# Patient Record
Sex: Male | Born: 1945 | Race: White | Hispanic: No | Marital: Married | Smoking: Former smoker
Health system: Southern US, Community
[De-identification: ages and names within clinical notes are randomized; demographics above are authoritative.]

## PROBLEM LIST (undated history)

## (undated) DIAGNOSIS — R351 Nocturia: Secondary | ICD-10-CM

## (undated) DIAGNOSIS — G4752 REM sleep behavior disorder: Secondary | ICD-10-CM

## (undated) DIAGNOSIS — M542 Cervicalgia: Secondary | ICD-10-CM

## (undated) DIAGNOSIS — I2510 Atherosclerotic heart disease of native coronary artery without angina pectoris: Secondary | ICD-10-CM

## (undated) DIAGNOSIS — Z8719 Personal history of other diseases of the digestive system: Secondary | ICD-10-CM

## (undated) DIAGNOSIS — E349 Endocrine disorder, unspecified: Secondary | ICD-10-CM

## (undated) DIAGNOSIS — K5790 Diverticulosis of intestine, part unspecified, without perforation or abscess without bleeding: Secondary | ICD-10-CM

## (undated) DIAGNOSIS — G4730 Sleep apnea, unspecified: Secondary | ICD-10-CM

## (undated) DIAGNOSIS — E785 Hyperlipidemia, unspecified: Secondary | ICD-10-CM

## (undated) DIAGNOSIS — E119 Type 2 diabetes mellitus without complications: Secondary | ICD-10-CM

## (undated) DIAGNOSIS — I252 Old myocardial infarction: Secondary | ICD-10-CM

## (undated) DIAGNOSIS — H269 Unspecified cataract: Secondary | ICD-10-CM

## (undated) DIAGNOSIS — K219 Gastro-esophageal reflux disease without esophagitis: Secondary | ICD-10-CM

## (undated) DIAGNOSIS — K279 Peptic ulcer, site unspecified, unspecified as acute or chronic, without hemorrhage or perforation: Secondary | ICD-10-CM

## (undated) DIAGNOSIS — E039 Hypothyroidism, unspecified: Secondary | ICD-10-CM

## (undated) HISTORY — DX: Atherosclerotic heart disease of native coronary artery without angina pectoris: I25.10

## (undated) HISTORY — PX: CARDIAC CATHETERIZATION: SHX172

## (undated) HISTORY — DX: Peptic ulcer, site unspecified, unspecified as acute or chronic, without hemorrhage or perforation: K27.9

## (undated) HISTORY — PX: ESOPHAGOGASTRODUODENOSCOPY: SHX1529

## (undated) HISTORY — DX: Endocrine disorder, unspecified: E34.9

## (undated) HISTORY — DX: Old myocardial infarction: I25.2

## (undated) HISTORY — DX: Gastro-esophageal reflux disease without esophagitis: K21.9

## (undated) HISTORY — DX: Hyperlipidemia, unspecified: E78.5

## (undated) HISTORY — DX: Hypothyroidism, unspecified: E03.9

## (undated) HISTORY — DX: Type 2 diabetes mellitus without complications: E11.9

---
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
## 1999-12-03 HISTORY — PX: CORONARY ANGIOPLASTY WITH STENT PLACEMENT: SHX49

## 2000-08-23 ENCOUNTER — Encounter: Payer: Self-pay | Admitting: Emergency Medicine

## 2000-08-23 ENCOUNTER — Inpatient Hospital Stay (HOSPITAL_COMMUNITY): Admission: EM | Admit: 2000-08-23 | Discharge: 2000-08-25 | Payer: Self-pay | Admitting: Cardiology

## 2000-09-09 ENCOUNTER — Encounter (HOSPITAL_COMMUNITY): Admission: RE | Admit: 2000-09-09 | Discharge: 2000-12-08 | Payer: Self-pay | Admitting: Cardiology

## 2000-12-09 ENCOUNTER — Encounter (HOSPITAL_COMMUNITY): Admission: RE | Admit: 2000-12-09 | Discharge: 2001-03-09 | Payer: Self-pay | Admitting: Cardiology

## 2002-04-30 ENCOUNTER — Emergency Department (HOSPITAL_COMMUNITY): Admission: EM | Admit: 2002-04-30 | Discharge: 2002-05-01 | Payer: Self-pay | Admitting: Emergency Medicine

## 2003-12-03 HISTORY — PX: COLONOSCOPY: SHX174

## 2003-12-12 ENCOUNTER — Encounter (INDEPENDENT_AMBULATORY_CARE_PROVIDER_SITE_OTHER): Payer: Self-pay | Admitting: *Deleted

## 2003-12-12 ENCOUNTER — Ambulatory Visit (HOSPITAL_COMMUNITY): Admission: RE | Admit: 2003-12-12 | Discharge: 2003-12-12 | Payer: Self-pay | Admitting: Gastroenterology

## 2004-08-21 ENCOUNTER — Inpatient Hospital Stay (HOSPITAL_COMMUNITY): Admission: EM | Admit: 2004-08-21 | Discharge: 2004-08-22 | Payer: Self-pay | Admitting: Emergency Medicine

## 2007-07-21 ENCOUNTER — Encounter: Payer: Self-pay | Admitting: Internal Medicine

## 2007-09-01 ENCOUNTER — Ambulatory Visit: Payer: Self-pay | Admitting: Internal Medicine

## 2007-09-01 DIAGNOSIS — E349 Endocrine disorder, unspecified: Secondary | ICD-10-CM | POA: Insufficient documentation

## 2007-09-01 DIAGNOSIS — K279 Peptic ulcer, site unspecified, unspecified as acute or chronic, without hemorrhage or perforation: Secondary | ICD-10-CM | POA: Insufficient documentation

## 2007-09-01 DIAGNOSIS — K219 Gastro-esophageal reflux disease without esophagitis: Secondary | ICD-10-CM | POA: Insufficient documentation

## 2007-09-01 DIAGNOSIS — E785 Hyperlipidemia, unspecified: Secondary | ICD-10-CM

## 2007-09-01 DIAGNOSIS — I25118 Atherosclerotic heart disease of native coronary artery with other forms of angina pectoris: Secondary | ICD-10-CM | POA: Insufficient documentation

## 2007-09-01 DIAGNOSIS — E1169 Type 2 diabetes mellitus with other specified complication: Secondary | ICD-10-CM | POA: Insufficient documentation

## 2007-09-01 DIAGNOSIS — I2510 Atherosclerotic heart disease of native coronary artery without angina pectoris: Secondary | ICD-10-CM | POA: Insufficient documentation

## 2007-09-01 HISTORY — DX: Gastro-esophageal reflux disease without esophagitis: K21.9

## 2007-09-02 ENCOUNTER — Telehealth (INDEPENDENT_AMBULATORY_CARE_PROVIDER_SITE_OTHER): Payer: Self-pay | Admitting: *Deleted

## 2007-09-03 ENCOUNTER — Encounter: Payer: Self-pay | Admitting: Internal Medicine

## 2007-12-29 ENCOUNTER — Ambulatory Visit: Payer: Self-pay | Admitting: Cardiology

## 2008-01-06 ENCOUNTER — Ambulatory Visit: Payer: Self-pay

## 2008-01-06 ENCOUNTER — Encounter: Payer: Self-pay | Admitting: Internal Medicine

## 2008-03-14 ENCOUNTER — Telehealth: Payer: Self-pay | Admitting: Internal Medicine

## 2008-05-17 ENCOUNTER — Telehealth: Payer: Self-pay | Admitting: Internal Medicine

## 2008-05-20 ENCOUNTER — Telehealth: Payer: Self-pay | Admitting: Internal Medicine

## 2008-06-08 ENCOUNTER — Encounter: Payer: Self-pay | Admitting: Internal Medicine

## 2008-07-06 ENCOUNTER — Ambulatory Visit: Payer: Self-pay | Admitting: Internal Medicine

## 2009-02-20 ENCOUNTER — Ambulatory Visit: Payer: Self-pay | Admitting: Internal Medicine

## 2009-02-20 DIAGNOSIS — M5412 Radiculopathy, cervical region: Secondary | ICD-10-CM | POA: Insufficient documentation

## 2009-03-07 ENCOUNTER — Ambulatory Visit: Payer: Self-pay | Admitting: Internal Medicine

## 2009-05-16 ENCOUNTER — Telehealth: Payer: Self-pay | Admitting: Internal Medicine

## 2009-05-23 ENCOUNTER — Encounter: Payer: Self-pay | Admitting: Internal Medicine

## 2009-05-29 ENCOUNTER — Ambulatory Visit: Payer: Self-pay | Admitting: Internal Medicine

## 2009-05-29 DIAGNOSIS — E039 Hypothyroidism, unspecified: Secondary | ICD-10-CM | POA: Insufficient documentation

## 2009-05-29 LAB — CONVERTED CEMR LAB
Cholesterol, target level: 200 mg/dL
HDL goal, serum: 40 mg/dL
LDL Goal: 100 mg/dL

## 2009-06-05 ENCOUNTER — Encounter: Payer: Self-pay | Admitting: Internal Medicine

## 2009-06-16 ENCOUNTER — Telehealth: Payer: Self-pay | Admitting: Internal Medicine

## 2009-09-22 ENCOUNTER — Ambulatory Visit: Payer: Self-pay | Admitting: Internal Medicine

## 2009-11-07 ENCOUNTER — Ambulatory Visit: Payer: Self-pay | Admitting: Internal Medicine

## 2010-05-23 ENCOUNTER — Ambulatory Visit: Payer: Self-pay | Admitting: Internal Medicine

## 2010-05-23 DIAGNOSIS — T148 Other injury of unspecified body region: Secondary | ICD-10-CM

## 2010-05-23 DIAGNOSIS — W57XXXA Bitten or stung by nonvenomous insect and other nonvenomous arthropods, initial encounter: Secondary | ICD-10-CM | POA: Insufficient documentation

## 2010-05-23 LAB — CONVERTED CEMR LAB
ALT: 21 units/L (ref 0–53)
AST: 21 units/L (ref 0–37)
Albumin: 4.2 g/dL (ref 3.5–5.2)
Alkaline Phosphatase: 52 units/L (ref 39–117)
BUN: 13 mg/dL (ref 6–23)
Basophils Absolute: 0 10*3/uL (ref 0.0–0.1)
Basophils Relative: 0.5 % (ref 0.0–3.0)
Bilirubin, Direct: 0.1 mg/dL (ref 0.0–0.3)
CO2: 29 meq/L (ref 19–32)
Calcium: 9.2 mg/dL (ref 8.4–10.5)
Chloride: 110 meq/L (ref 96–112)
Cholesterol: 165 mg/dL (ref 0–200)
Creatinine, Ser: 1 mg/dL (ref 0.4–1.5)
Eosinophils Absolute: 0.1 10*3/uL (ref 0.0–0.7)
Eosinophils Relative: 2.1 % (ref 0.0–5.0)
GFR calc non Af Amer: 82.38 mL/min (ref >60)
Glucose, Bld: 96 mg/dL (ref 70–99)
HCT: 45.6 % (ref 39.0–52.0)
HDL: 40.2 mg/dL (ref >39.00)
Hemoglobin: 15.9 g/dL (ref 13.0–17.0)
LDL Cholesterol: 104 mg/dL — ABNORMAL HIGH (ref 0–99)
Lymphocytes Relative: 33 % (ref 12.0–46.0)
Lymphs Abs: 1.7 10*3/uL (ref 0.7–4.0)
MCHC: 34.7 g/dL (ref 30.0–36.0)
MCV: 92.9 fL (ref 78.0–100.0)
Monocytes Absolute: 0.4 10*3/uL (ref 0.1–1.0)
Monocytes Relative: 8.9 % (ref 3.0–12.0)
Neutro Abs: 2.8 10*3/uL (ref 1.4–7.7)
Neutrophils Relative %: 55.5 % (ref 43.0–77.0)
PSA: 1.6 ng/mL (ref 0.10–4.00)
Platelets: 155 10*3/uL (ref 150.0–400.0)
Potassium: 4.3 meq/L (ref 3.5–5.1)
RBC: 4.91 M/uL (ref 4.22–5.81)
RDW: 12.9 % (ref 11.5–14.6)
Sodium: 144 meq/L (ref 135–145)
TSH: 0.92 microintl units/mL (ref 0.35–5.50)
Total Bilirubin: 0.8 mg/dL (ref 0.3–1.2)
Total CHOL/HDL Ratio: 4
Total Protein: 7.2 g/dL (ref 6.0–8.3)
Triglycerides: 103 mg/dL (ref 0.0–149.0)
VLDL: 20.6 mg/dL (ref 0.0–40.0)
WBC: 5 10*3/uL (ref 4.5–10.5)

## 2010-05-31 ENCOUNTER — Ambulatory Visit: Payer: Self-pay | Admitting: Internal Medicine

## 2010-05-31 ENCOUNTER — Telehealth: Payer: Self-pay | Admitting: Internal Medicine

## 2010-09-20 ENCOUNTER — Ambulatory Visit: Payer: Self-pay | Admitting: Internal Medicine

## 2011-01-01 NOTE — Progress Notes (Signed)
Summary: simvastatin or crestor  Phone Note From Pharmacy   Caller: [facility]  [address]  [phone]* Summary of Call: Rec'd rx for simvastatin and crestor - is pt to be on both?   [phone] Initial call taken by: Duard Brady LPN,  May 31, 2010 3:29 PM  Follow-up for Phone Call        patient was to switch to crestor if price not prohibitive- not to take both drugs Follow-up by: Gordy Savers  MD,  May 31, 2010 5:14 PM  Additional Follow-up for Phone Call Additional follow up Details #1::        spoke with pharmacy - pt came in - went with zocor.  Additional Follow-up by: Duard Brady LPN,  May 31, 2010 5:19 PM

## 2011-01-01 NOTE — Assessment & Plan Note (Signed)
Summary: FLU-SHOT/RCD  Nurse Visit   Review of Systems       Flu Vaccine Consent Questions     Do you have a history of severe allergic reactions to this vaccine? no    Any prior history of allergic reactions to egg and/or gelatin? no    Do you have a sensitivity to the preservative Thimersol? no    Do you have a past history of Guillan-Barre Syndrome? no    Do you currently have an acute febrile illness? no    Have you ever had a severe reaction to latex? no    Vaccine information given and explained to patient? yes    Are you currently pregnant? no    Lot Number:[id number]   Exp Date:06/01/2011   Site Given  Left Deltoid IM    Allergies: No Known Drug Allergies  Orders Added: 1)  Flu Vaccine 3yrs + MEDICARE PATIENTS [Q2039] 2)  Administration Flu vaccine - MCR [G0008] 

## 2011-01-01 NOTE — Assessment & Plan Note (Signed)
Summary: ?spider bite/cpx labs v70.0/njr   Vital Signs:  Gamble profile:   65 year old male Weight:      214 pounds Temp:     97.7 degrees F oral BP sitting:   118 / 70  (right arm) Cuff size:   regular  Vitals Entered By: Duard Brady LPN (May 23, 2010 8:22 AM) CC: c/o ?spider bite to (R) upper thigh Is Gamble Diabetic? No   CC:  c/o ?spider bite to (R) upper thigh.  History of Present Illness: [age] year old  Gamble, who is seen today for follow-up.  Three days ago he sustained a bite to the right inner thigh region.  And he was concerned about a possible spider bite.  He felt an initial  sting  but was not able to identify any spider or bee  or other insect.  He had an initial soft tissue  reaction of approximately 8 cm with erythema and induration.  This has improved to approximately 2 x 4 cm.  Preventive Screening-Counseling & Management  Alcohol-Tobacco     Smoking Status: current  Allergies (verified): No Known Drug Allergies  Social History: Smoking Status:  current  Physical Exam  General:  Well-developed,well-nourished,in no acute distress; alert,appropriate and cooperative throughout examination Skin:  2 x 4 cm area of slight erythema and ecchymoses with a small 3 to 4-mm crust.  no fluctuance   Impression & Recommendations:  Problem # 1:  INSECT BITE (ICD-919.4)  Complete Medication List: 1)  Levothroid 125 Mcg Tabs (Levothyroxine sodium) .Marland Kitchen.. 1 once daily 2)  Albertsons Aspirin 325 Mg Tabs (Aspirin) .Marland Kitchen.. 1 once daily 3)  Simvastatin 40 Mg Tabs (Simvastatin) .... One daily 4)  Cialis 20 Mg Tabs (Tadalafil) .... One daily as  directed  Gamble Instructions: 1)  call if worsening pain, redness, or fever  Appended Document: Orders Update    Clinical Lists Changes  Orders: Added new Service order of Venipuncture (16109) - Signed Added new Service order of UA Dipstick w/o Micro (automated)  (81003) - Signed Added new Test order of TLB-Lipid  Panel (80061-LIPID) - Signed Added new Test order of TLB-BMP (Basic Metabolic Panel-BMET) (80048-METABOL) - Signed Added new Test order of TLB-CBC Platelet - w/Differential (85025-CBCD) - Signed Added new Test order of TLB-Hepatic/Liver Function Pnl (80076-HEPATIC) - Signed Added new Test order of TLB-TSH (Thyroid Stimulating Hormone) (84443-TSH) - Signed Added new Test order of TLB-PSA (Prostate Specific Antigen) (84153-PSA) - Signed Observations: Added new observation of COMMENTS: Wynona Canes, CMA  May 23, 2010 10:44 AM  (05/23/2010 8:54) Added new observation of PH URINE: 6.0  (05/23/2010 8:54) Added new observation of SPEC GR URIN: 1.025  (05/23/2010 8:54) Added new observation of APPEARANCE U: Clear  (05/23/2010 8:54) Added new observation of UA COLOR: yellow  (05/23/2010 8:54) Added new observation of WBC DIPSTK U: negative  (05/23/2010 8:54) Added new observation of NITRITE URN: negative  (05/23/2010 8:54) Added new observation of UROBILINOGEN: 0.2  (05/23/2010 8:54) Added new observation of PROTEIN, URN: negative  (05/23/2010 8:54) Added new observation of BLOOD UR DIP: 2+  (05/23/2010 8:54) Added new observation of KETONES URN: negative  (05/23/2010 8:54) Added new observation of BILIRUBIN UR: negative  (05/23/2010 8:54) Added new observation of GLUCOSE, URN: negative  (05/23/2010 8:54)      Laboratory Results   Urine Tests  Date/Time Recieved: May 23, 2010 10:44 AM  Date/Time Reported: May 23, 2010 10:44 AM   Routine Urinalysis   Color: yellow Appearance: Clear Glucose: negative   (  Normal Range: Negative) Bilirubin: negative   (Normal Range: Negative) Ketone: negative   (Normal Range: Negative) Spec. Gravity: 1.025   (Normal Range: 1.003-1.035) Blood: 2+   (Normal Range: Negative) pH: 6.0   (Normal Range: 5.0-8.0) Protein: negative   (Normal Range: Negative) Urobilinogen: 0.2   (Normal Range: 0-1) Nitrite: negative   (Normal Range: Negative) Leukocyte  Esterace: negative   (Normal Range: Negative)    Comments: Wynona Canes, CMA  May 23, 2010 10:44 AM

## 2011-01-01 NOTE — Assessment & Plan Note (Signed)
Summary: cpx/njr   Vital Signs:  Patient profile:   65 year old male Height:      67.25 inches Weight:      213 pounds Temp:     97.8 degrees F oral BP sitting:   120 / 80  (right arm) Cuff size:   regular  Vitals Entered By: Duard Brady LPN (May 31, 2010 8:40 AM) CC: cpx - doing well Is Patient Diabetic? No   CC:  cpx - doing well.  History of Present Illness: 65 year-old patient who has a history of coronary artery disease.  He has dyslipidemia, testosterone deficiency and also a mild right cervical radiculopathy.  Is doing quite well. Here for Medicare AWV:  1.   Risk factors based on Past M, S, F history:  patient has known coronary artery disease, status post stenting 2.   Physical Activities: no restrictions or limitations 3.   Depression/mood: history depression, or mood disorder 4.   Hearing: no auditory deficits 5.   ADL's: completely active and independent in all aspects of daily living 6.   Fall Risk: low 7.   Home Safety: no problems identified 8.   Height, weight, &visual acuity:no change in height.  Her weight has had a recent eye examination and glaucoma check 9.   Counseling: heart healthy diet or regular exercise encouraged as well as modest weight loss 10.   Labs ordered based on risk factors: laboratory studies, including lipid profile reviewed.  LDL approximately 100 Will intensify statin therapy 11.           Referral Coordination- not appropriate at this time 12.           Care Plan- heart healthy diet modest weight loss and exercise regimen encouraged 13.            Cognitive Assessment- alert and oriented, with normal affect, and   Preventive Screening-Counseling & Management  Alcohol-Tobacco     Smoking Status: current  Allergies (verified): No Known Drug Allergies  Past History:  Past Medical History: Reviewed history from 11/07/2009 and no changes required. Coronary artery disease  status post stenting 2001 Hyperlipidemia Peptic  ulcer disease patient admitted to the hospital for chest pain, and 2005, underwent heart catheterization and also had a negative Cardiolite stress test GERD testosterone deficiency Hypothyroidism history of impaired glucose tolerance  Past Surgical History: Reviewed history from 07/06/2008 and no changes required. Tonsillectomy colonoscopy 2005  Cardiolite  stress test February 2009  Family History: Reviewed history from 05/29/2009 and no changes required. father age 87  memory loss mother, age 74  One brother deceased with hepatitis and half brother, coronary artery disease one sister at 7, is well  Social History: Reviewed history from 07/06/2008 and no changes required. Married Regular exercise-no  Review of Systems  The patient denies anorexia, fever, weight loss, weight gain, vision loss, decreased hearing, hoarseness, chest pain, syncope, dyspnea on exertion, peripheral edema, prolonged cough, headaches, hemoptysis, abdominal pain, melena, hematochezia, severe indigestion/heartburn, hematuria, incontinence, genital sores, muscle weakness, suspicious skin lesions, transient blindness, difficulty walking, depression, unusual weight change, abnormal bleeding, enlarged lymph nodes, angioedema, breast masses, and testicular masses.    Physical Exam  General:  Well-developed,well-nourished,in no acute distress; alert,appropriate and cooperative throughout examination Head:  Normocephalic and atraumatic without obvious abnormalities. No apparent alopecia or balding. Eyes:  No corneal or conjunctival inflammation noted. EOMI. Perrla. Funduscopic exam benign, without hemorrhages, exudates or papilledema. Vision grossly normal. Ears:  External ear exam shows no significant  lesions or deformities.  Otoscopic examination reveals clear canals, tympanic membranes are intact bilaterally without bulging, retraction, inflammation or discharge. Hearing is grossly normal bilaterally. Nose:   External nasal examination shows no deformity or inflammation. Nasal mucosa are pink and moist without lesions or exudates. Mouth:  Oral mucosa and oropharynx without lesions or exudates.  Teeth in good repair. Neck:  No deformities, masses, or tenderness noted. Chest Wall:  No deformities, masses, tenderness or gynecomastia noted. Breasts:  No masses or gynecomastia noted Lungs:  Normal respiratory effort, chest expands symmetrically. Lungs are clear to auscultation, no crackles or wheezes. Heart:  Normal rate and regular rhythm. S1 and S2 normal without gallop, murmur, click, rub or other extra sounds. Abdomen:  Bowel sounds positive,abdomen soft and non-tender without masses, organomegaly or hernias noted. Rectal:  No external abnormalities noted. Normal sphincter tone. No rectal masses or tenderness. Genitalia:  Testes bilaterally descended without nodularity, tenderness or masses. No scrotal masses or lesions. No penis lesions or urethral discharge. Prostate:  1+ enlarged.  1+ enlarged.   Msk:  No deformity or scoliosis noted of thoracic or lumbar spine.   Pulses:  dorsalis pedis pulses faint ;posterior tibial pulses were full Extremities:  No clubbing, cyanosis, edema, or deformity noted with normal full range of motion of all joints.   Neurologic:  No cranial nerve deficits noted. Station and gait are normal. Plantar reflexes are down-going bilaterally. DTRs are symmetrical throughout. Sensory, motor and coordinative functions appear intact. Skin:  Intact without suspicious lesions or rashes Cervical Nodes:  No lymphadenopathy noted Axillary Nodes:  No palpable lymphadenopathy Inguinal Nodes:  No significant adenopathy Psych:  Cognition and judgment appear intact. Alert and cooperative with normal attention span and concentration. No apparent delusions, illusions, hallucinations   Impression & Recommendations:  Problem # 1:  HYPOTHYROIDISM (ICD-244.9)  His updated medication list for  this problem includes:    Levothroid 125 Mcg Tabs (Levothyroxine sodium) .Marland Kitchen... 1 once daily  His updated medication list for this problem includes:    Levothroid 125 Mcg Tabs (Levothyroxine sodium) .Marland Kitchen... 1 once daily  Problem # 2:  CERVICAL RADICULOPATHY, RIGHT (ICD-723.4)  Problem # 3:  TESTOSTERONE DEFICIENCY (ICD-257.2)  Problem # 4:  CORONARY ARTERY DISEASE (ICD-414.00)  His updated medication list for this problem includes:    Albertsons Aspirin 325 Mg Tabs (Aspirin) .Marland Kitchen... 1 once daily  Orders: EKG w/ Interpretation (93000)  His updated medication list for this problem includes:    Albertsons Aspirin 325 Mg Tabs (Aspirin) .Marland Kitchen... 1 once daily  Problem # 5:  Preventive Health Care (ICD-V70.0)  Complete Medication List: 1)  Levothroid 125 Mcg Tabs (Levothyroxine sodium) .Marland Kitchen.. 1 once daily 2)  Albertsons Aspirin 325 Mg Tabs (Aspirin) .Marland Kitchen.. 1 once daily 3)  Simvastatin 40 Mg Tabs (Simvastatin) .... One daily 4)  Cialis 20 Mg Tabs (Tadalafil) .... One daily as  directed 5)  Crestor 40 Mg Tabs (Rosuvastatin calcium) .... One daily  Other Orders: First annual wellness visit with prevention plan  [phone])  Patient Instructions: 1)  Please schedule a follow-up appointment in 1 year. 2)  Limit your Sodium (Salt). 3)  It is important that you exercise regularly at least 20 minutes 5 times a week. If you develop chest pain, have severe difficulty breathing, or feel very tired , stop exercising immediately and seek medical attention. 4)  You need to lose weight. Consider a lower calorie diet and regular exercise.  Prescriptions: CRESTOR 40 MG TABS (ROSUVASTATIN CALCIUM) one daily  #  90 x 6   Entered and Authorized by:   Gordy Savers  MD   Signed by:   Gordy Savers  MD on 05/31/2010   Method used:   Electronically to        [facility]  [phone]* (retail)       [address]       [place], [place]  [address]       Ph: [phone] or  [phone]       Fax: [phone]   RxID:   [id number] CIALIS 20 MG  TABS (TADALAFIL) one daily as  directed  #12 x 6   Entered and Authorized by:   Gordy Savers  MD   Signed by:   Gordy Savers  MD on 05/31/2010   Method used:   Electronically to        [facility]  [phone]* (retail)       [address]       [place], [place]  [address]       Ph: [phone] or [phone]       Fax: [phone]   RxID:   [id number] SIMVASTATIN 40 MG  TABS (SIMVASTATIN) one daily  #90 Each x 6   Entered and Authorized by:   Gordy Savers  MD   Signed by:   Gordy Savers  MD on 05/31/2010   Method used:   Electronically to        [facility]  [phone]* (retail)       [address]       [place], [place]  [address]       Ph: [phone] or [phone]       Fax: [phone]   RxID:   [id number] LEVOTHROID 125 MCG  TABS (LEVOTHYROXINE SODIUM) 1 once daily  #90 Each x 6   Entered and Authorized by:   Gordy Savers  MD   Signed by:   Gordy Savers  MD on 05/31/2010   Method used:   Electronically to        [facility]  [phone]* (retail)       [address]       [place], [place]  [address]       Ph: [phone] or [phone]       Fax: [phone]   RxID:   [id number]   Appended Document: cpx/njr    Clinical Lists Changes  Medications: Removed medication of CRESTOR 40 MG TABS (ROSUVASTATIN CALCIUM) one daily

## 2011-02-04 ENCOUNTER — Telehealth: Payer: Self-pay | Admitting: Internal Medicine

## 2011-02-04 NOTE — Telephone Encounter (Signed)
Triage vm------wants nurse to return call.

## 2011-02-04 NOTE — Telephone Encounter (Signed)
Calling for mother inlaw? Had a spell where she "blamked out" - no LOC - no slurred speech , feeling fine - ok to watch - if it happens again - needs to be been. KIK

## 2011-04-16 NOTE — Assessment & Plan Note (Signed)
[facility]                            CARDIOLOGY OFFICE NOTE   NAME:Adrian Gamble, Adrian Gamble                           MRN:          [id number]  DATE:12/29/2007                            DOB:          Oct 23, 1946    PRIMARY CARE PHYSICIAN:  Gordy Savers, MD.   REASON FOR PRESENTATION:  Evaluate patient with shortness of breath,  chest discomfort and previous coronary disease.   HISTORY OF PRESENT ILLNESS:  The patient is a pleasant 65 year old  gentleman with a history of coronary artery disease including myocardial  infarction in 2001 with stenting to his right coronary.  He has had,  otherwise, nonobstructive disease with 60-70% LAD lesion.  He has not  been seen in follow-up in over 3 years.  He had no new cardiovascular  symptoms until about 3 months ago.  He started noticing dyspnea with  activities.  Some of this was with bending over.  He is not particularly  active.  He did notice it pushing a lawnmower this summer.  He had to  stop towards the end of the year when he was doing it at rest.  Activities currently daily, such as walking upstairs, does not  particular bother him.  He is not getting any resting symptoms and does  not describe any PND or orthopnea.  He has had an uneasy feeling in his  chest sporadically.  This happens at rest.  He cannot bring this on with  activity, though again he is relatively sedentary.  He has not had any  classic symptoms such as substernal pressure, neck discomfort, arm  discomfort, activity induced nausea, vomiting or excessive diaphoresis.  He is not describing palpitations, presyncope or syncope.   PAST MEDICAL HISTORY:  1. Coronary artery disease (inferior myocardial infarction 2001 with      stenting of an RCA lesion and total occlusion of that vessel.  The      last catheterization was in 2005 demonstrating 60-70% mid-LAD      stenosis, luminal irregularities in the circumflex system, patent      right  coronary stent with 25% scattered lesions in the right      system.  Well-preserved ejection fraction.).  2. Hypothyroidism.  3. Diverticulitis.  4. Hiatal hernia.  5. Dyslipidemia.   ALLERGIES:  None.   MEDICATIONS:  1. Aspirin 325 mg daily.  2. Synthroid 0.125 mg daily.  3. Statin (we cannot identify which one).   SOCIAL HISTORY:  The patient is a Engineer, water.  He is married.  He  has three children and six grandchildren.  Quit smoking many years ago.  He does occasionally smoke cigar.   REVIEW OF SYSTEMS:  As stated in the HPI and otherwise negative for  other systems.   PHYSICAL EXAMINATION:  GENERAL:  The patient is in no distress.  VITAL SIGNS:  Blood pressure 120/94, heart rate 90 and regular, weight  230 pounds, body mass index 34.  HEENT:  Eyelids are unremarkable.  Pupils equal, round, reactive, fundi  not well visualized.  Oral mucosa unremarkable.  NECK:  No jugular distention at 45 degrees.  Carotid upstroke brisk and  symmetrical.  No bruits, no thyromegaly.  LYMPHATICS:  No cervical, axillary, inguinal adenopathy.  LUNGS:  Clear to auscultation bilaterally.  BACK:  No costovertebral tenderness.  CHEST:  Unremarkable.  HEART:  PMI not displaced or sustained, S1-S2 within normal.  No S3, no  S4, no clicks, rubs, murmurs.  ABDOMEN:  Obese, positive bowel sounds, normal in frequency and pitch,  no bruits, rebound, guarding or midline pulsatile mass.  No  hepatomegaly, no splenomegaly.  SKIN:  No rashes, no nausea.  EXTREMITIES:  Pulses 2+ throughout, no edema, cyanosis or clubbing.  NEUROLOGICAL:  Oriented first place, time, cranial nerves II-XII grossly  intact, motor grossly intact.   STUDIES:  EKG sinus rhythm, rate 90, axis within normal limits,  intervals within normal limits, no acute ST-T wave changes.   ASSESSMENT/PLAN:  1. Dyspnea and chest discomfort.  The patient does have the symptoms      as described above.  He has known coronary disease  with a 60-70%      LAD lesion in 2005.  He has not participated aggressively in risk      reduction.  Though his lipids apparently have been managed.  Given      all of this, the pretest probability of obstructive coronary      disease is at least moderately high.  Proceeding with a stress      perfusion study is indicated and necessary.  The patient could walk      on a treadmill, and I will order this.  Further evaluation based on      these results.  2. Dyslipidemia.  I will try to review any recent labs.  Will try to      find the name of the statin that he is taking.  The goal with his      lipids would be an LDL of less than 100 and HDL greater than 40.  3. Obesity.  He understands he needs to loose weight with diet and      exercise, and we discussed the [employer].  Following a      stress test I may be able to give him a prescription for exercise.  4. Hypothyroidism.  He continues on the Synthroid.  5. Tobacco.  He is told to avoid all tobacco products including his      cigars.  6. Follow-up.  I would see him back based on results of the above or      continued symptoms.  I      did tell him I think he should see me about every 18 months if he      is doing well and has no symptoms or abnormal tests.     Rollene Rotunda, MD, [facility]  Electronically Signed    JH/MedQ  DD: 12/29/2007  DT: 12/30/2007  Job #: [id number]   cc:   Gordy Savers, MD

## 2011-04-19 NOTE — Discharge Summary (Signed)
[place]  Patient:    Adrian Gamble, Adrian Gamble                           MRN: [id number] Adm. Date:  [id number] Disc. Date: [id number] Attending:  Shelba Flake Dictator:   Delton See, P.A.                           Discharge Summary  ADDENDUM  DATE OF BIRTH:  07-01-1946.  This patient had a chest x-ray performed at [facility] that apparently was not read at [facility] and was transferred to [facility] supposedly with the patient; however, the chest x-ray was lost. It was finally located and read today by the radiologist after the patient had already been discharged. The chest x-ray was read as negative for active disease with the heart thought to be at the upper limits of normal in size. There were low lung volumes and the pulmonary vascularity and lung fields were thought to be within normal limits. This was a portable film done at [facility] September 22 at the time of the patients admission. DD:  08/25/00 TD:  08/25/00 Job: [id number]

## 2011-04-19 NOTE — Discharge Summary (Signed)
[place]  Patient:    ESHAWN, COOR                           MRN: [id number] Adm. Date:  [id number] Disc. Date: [id number] Attending:  Shelba Flake Dictator:   Delton See, P.A. CC:         Otilio Connors. Gerri Spore, M.D.                           Discharge Summary  HISTORY OF PRESENT ILLNESS:  Mr. Klarich is a 65 year old male with a history of hypothyroidism, but no prior cardiac history.  He developed chest pain at approximately 1:30 p.m. while mowing the lawn.  He was brought to [facility] emergency room at 3 p.m. and EKG showed an acute inferior MI with ST elevation of 3 mm in 3 and aVF.  There was ST depression of 2 mm in 1 and aVL.  He received nitroglycerin and aspirin without relief.  He was admitted for further evaluation.  PAST MEDICAL HISTORY:  Significant for hypothyroidism, diverticulitis, and hiatal hernia.  ALLERGIES:  No known drug allergies.  MEDICATIONS:  Synthroid dosage unknown.  FAMILY HISTORY:  Significant for father having coronary artery bypass graft surgery in his 67s.  SOCIAL HISTORY:  The patient quit smoking eight years ago.  He does smoke an occasional cigar.  HOSPITAL COURSE:  As noted, this patient was admitted to [facility] by Dr. Antoine Poche with an acute inferior MI.  He was taken to the cardiac catheterization laboratory by Dr. Chales Abrahams.  He was found to have a 100% occluded RCA.  PTCA stenting was performed on the lesion reducing it to 0%. The patient also had 30, 60, and 70% LAD lesions.  There was a mid 90% RCA lesion as well.  The ejection fraction was 55% with mild hypokinesis.  The patient tolerated the procedure well.  He was enrolled in the Prove-it trial for treatment of hyperlipidemia and he was discharged on August 25, 2000, in improved condition.  LABORATORY DATA:  Cardiac enzymes on August 24, 2000, revealed a CK of 362, MB 46.4, relative index was 12.8.   Basic metabolic panel on August 24, 2000, showed a BUN 12, creatinine 1.0, potassium 3.6, sodium 139, glucose 88. A CBC on September 23, revealed hemoglobin 14.2, hematocrit 40.8, WBC 9.7 thousand, and platelets 233,000.  TSH was 2.231.  Lipid profile revealed cholesterol 192, triglycerides 156, HDL low at 31, LDL 130 which was the upper limits of normal.  CK-MB enzymes on September 23, revealed a totak CK of 649, MB 77.4, index 11.9.  This was the peak of his cardiac enzymes.  An EKG showed normal sinus rhythm with sinus arrhythmia, inferior infarct, age undetermined.  DISCHARGE MEDICATIONS: 1. Synthroid as taken previously. 2. Coated aspirin 325 mg each day. 3. Lopressor 50 mg 1/4 tablet twice each day. 4. Plavix 75 mg daily for four weeks. 5. Nitroglycerin as needed for chest pain.  The patient was instructed in the use of nitroglycerin.  He was also to be on the study drug for elevated lipids.  The patient was told to avoid any strenuous activity until seen in follow-up. He was told he could do some light walking.  He was to be on a low salt, low fat, diet.  He was told to call the office if he had any increased pain,  swelling, or bleeding from her groin.  He could return to work in one to two weeks.  He was to see Dr. Antoine Poche, October 8, at 12:30.  He was to see Dr. Gerri Spore as needed or as scheduled.  DISCHARGE DIAGNOSES: 1. Acute myocardial infarction, August 23, 2000, treated with percutaneous    transluminal coronary angioplasty stenting of the right coronary artery    with residual lesions as detailed above, ejection fraction 55%. 2. History of hypothyroidism. 3. History of hiatal hernia. 4. Enrolled in the Prove-it trial for elevated lipids. 5. Cardiac rehabilitation recommended in two weeks after discharge.  This    was set up while at [facility]. 6. Cardiolite study recommended in 7 to 8 weeks to further evaluate his    coronary artery  disease. 7. Remote history of tobacco use. DD:  08/25/00 TD:  08/25/00 Job: [id number]

## 2011-04-19 NOTE — Op Note (Signed)
NAME:  Adrian Gamble, Adrian Gamble NO.:  [id number]   MEDICAL RECORD NO.:  [id number]                   PATIENT TYPE:  AMB   LOCATION:  ENDO                                 FACILITY:  [facility]   PHYSICIAN:  Graylin Shiver, M.D.                DATE OF BIRTH:  1946/11/29   DATE OF PROCEDURE:  12/12/2003  DATE OF DISCHARGE:                                 OPERATIVE REPORT   PROCEDURE:  Colonoscopy with biopsy.   ENDOSCOPIST:  Graylin Shiver, M.D.   INDICATIONS FOR PROCEDURE:  Screening.   INFORMED CONSENT:  An informed consent was obtained after an explanation of  the risks of bleeding, infection and perforation.   PREMEDICATION:  Fentanyl 60 mcg IV, Versed 6 mg IV.   DESCRIPTION OF PROCEDURE:  With the patient in the left lateral decubitus  position, a rectal examination was performed, and no masses were felt.  The  Olympus colonoscope was inserted into the rectum and advanced around the  colon to the cecum.  The cecal landmarks were identified.  The cecum and  ascending colon were normal.  The transverse colon was normal.  The  descending colon and sigmoid revealed moderate diverticulosis.  In the  sigmoid there was a small polyp biopsied off with cold forceps.  The rectum  looked normal.  Also in the sigmoid there was an everted diverticulum.  The patient tolerated the procedure well without complications.   IMPRESSION:  1. Diverticulosis - code #562.10.  2. Sigmoid polyp - code #211.3.                                               Graylin Shiver, M.D.    Germain Osgood  D:  12/12/2003  T:  12/12/2003  Job:  [id number]   cc:   Otilio Connors. Gerri Spore, M.D.  [address]  [place]  [place] [address]  Fax: [phone]

## 2011-04-19 NOTE — H&P (Signed)
NAME:  Adrian Gamble, UY NO.:  [id number]   MEDICAL RECORD NO.:  [id number]          PATIENT TYPE:  EMS   LOCATION:  MAJO                         FACILITY:  [facility]   PHYSICIAN:  Charlies Constable, M.D. [facility] DATE OF BIRTH:  July 24, 1946   DATE OF ADMISSION:  08/21/2004  DATE OF DISCHARGE:                                HISTORY & PHYSICAL   REASON FOR ADMISSION:  The patient is a 65 year old male, with known  coronary artery disease, status post acute ST elevation inferior myocardial  infarction in September 2001, treated with stenting (non-DES) of a totally  occluded proximal RCA with thrombus, who is now seen here in the office as  an add-on for evaluation of recent, acute fatigue and persistent chest  tightness which started this morning.   The patient has not been seen here in the office since November of 2003.  Following this acute intervention, with residual coronary anatomy notable  for a 60% proximal, 70% mid LAD and preserved left ventricular function, the  patient had a normal stress Cardiolite in November 2003.   The patient reportedly has done well since his last stress test.  He has,  however, had 4 brief episodes of left-sided chest discomfort described as a  twinge, not associated with any significant symptoms nor radiation.  He  also denied any recent exertional chest discomfort.  The patient recently  drove 250 miles to [place], with his family, but reported frequent stops.  Since Saturday night, however, he has felt extremely worn out and fatigued.  This morning, during a scheduled visit with his primary care physician, he  reported the fatigue as well as development of pan-sternal chest tightness.  He states that this is reminiscent of the early stages of his MI in 2001.   Electrocardiogram here in the office revealed sinus tachycardia at 105 BPM  with no acute changes.   ALLERGIES:  No known drug allergies.   CURRENT MEDICATIONS:  1.  Coated aspirin  325 mg daily.  2.  Lipitor 40 mg daily.  3.  Synthroid 0.1 mg daily.   PAST MEDICAL HISTORY:  1.  Coronary artery disease      1.  Status post acute ST inferior MI/stent (Elite), 100 % RCA with          thrombus, September 2001.      2.  Residual 60% proximal/70% mid LAD; normal CFX.      3.  Preserved left ventricular function.      4.  PROVE-IT Trial.  2.  Dyslipidemia.  3.  Hiatal hernia.  4.  History of diverticulitis.   SOCIAL HISTORY:  The patient lives here in [place] with his wife.  He has  2 grown children.  He works as a Engineer, water at [employer].  He smokes the  occasional cigar, but has not smoked cigarettes in 20 years.  He drinks beer  on rare occasion.   FAMILY HISTORY:  Father, age 76, status post CABG in his 24s.   REVIEW OF SYSTEMS:  Review of systems negative for recent exertional  dyspnea,  orthopnea, PND, pedal edema, or tachy-palpitations.  Denies any  recent evidence of upper or lower GI bleeding.  Has occasional heartburn  symptoms.  Otherwise as per HPI, remaining systems negative.   PHYSICAL EXAMINATION:  VITAL SIGNS:  Blood pressure 120/66, pulse 105,  regular, weight 230.5.  GENERAL:  [age]-year-old male in no apparent distress.  HEENT:  Normocephalic, atraumatic.  NECK:  Preserved bilateral carotid pulses without bruits.  LUNGS:  Lungs clear to auscultation in all fields.  HEART:  Regular rate and rhythm (S1 and S2), positive S4.  No murmurs.  ABDOMEN:  Abdomen soft, nontender.  Intact bowel sounds.  No bruits.  EXTREMITIES:  Preserved bilateral femoral pulses without bruits; intact  distal pulses.  No pedal edema nor focal deficit.   IMPRESSION:  1.  Unstable angina pectoris.  2.  Sinus tachycardia.  3.  Coronary artery disease.      1.  Status post acute inferior myocardial infarction/stent by right          coronary artery, September 2001.      2.  Preserved left ventricular function.  4.  Dyslipidemia.  5.  Tobacco.  6.  Hiatal  hernia.  7.  Obesity.   PLAN:  Following review with Dr. Charlies Constable, recommendation is to proceed  with direct admission to [facility] for management and further  evaluation of symptoms worrisome for unstable angina pectoris.  The patient  will be treated with aspirin and started on Lopressor 25 mg q.8 h.  He will  be placed on intravenous nitroglycerin and heparin.  In addition to routine  blood work, we will cycle cardiac markers, check fasting lipids, TSH and a D-  dimer level.  Plan is to proceed with diagnostic coronary angiography and  the patient is agreeable with this plan and risks/benefits have been  discussed, and patient is willing to proceed.       GS/MEDQ  D:  08/21/2004  T:  08/21/2004  Job:  [id number]   cc:   Otilio Connors. Gerri Spore, M.D.  [address]  [place]  [place] [address]  Fax: [phone]

## 2011-04-19 NOTE — Discharge Summary (Signed)
Adrian Gamble, Adrian Gamble NO.:  [id number]   MEDICAL RECORD NO.:  [id number]          PATIENT TYPE:  INP   LOCATION:  2023                         FACILITY:  [facility]   PHYSICIAN:  Charlies Constable, M.D. [facility] DATE OF BIRTH:  02-29-1946   DATE OF ADMISSION:  08/21/2004  DATE OF DISCHARGE:  08/22/2004                           DISCHARGE SUMMARY - REFERRING   PROCEDURES:  1.  Cardiac catheterization August 21, 2004.  2.  Adenosine Cardiolite.   REASON FOR ADMISSION:  Please refer to dictated admission note.   LABORATORY DATA:  Normal serial cardiac markers.  D-Dimer 0.66.  Lipid  profile:  Total cholesterol 113, triglyceride 92, HDL 29, LDL 66, TSH 1.62,  normal electrolytes and renal function, mildly elevated AST 43, ALT 49,  otherwise normal liver panel, normal CBC.   Admission chest x-ray:  NAD.   HOSPITAL COURSE:  Following direct admission from the office, where the  patient presented with a generalized fatigue and persistent chest tightness  reminiscent of his previous inferior mi, the patient was stabilized on  medication regimen including aspirin, beta-blocker, intravenous  nitroglycerin, and heparin.   Serial cardiac markers were all within normal limits.  Of note, D-Dimer was  mildly elevated (0.66).  No further workup was recommended.   The patient proceed directly to the catheterization lab for diagnostic  coronary angiography, performed by Dr. Antoine Poche (see report for full  details).  This revealed nonobstructive coronary artery disease with a  patent right coronary artery stent and normal left ventricular function.   Given this finding, recommendation was to proceed with a followup adenosine  Cardiolite.  This was performed prior to discharge and revealed no evidence  of ischemia; calculated ejection fraction of 53%.   No further workup was recommended.  Of note, Dr. Antoine Poche commented on the  mildly elevated D-Dimer following his catheterization and  felt that there  was no clinical evidence suggestive of DVT or pulmonary embolus.   On scheduled day of discharge, the patient was started on low-dose Norvasc,  by Dr. Dietrich Pates, for antianginal treatment.  He noted, however, that the  patient's low blood pressure (90/50) limited further medical management.   DISCHARGE MEDICATIONS:  1.  Coated aspirin 325 mg daily.  2.  Lipitor 40 mg daily.  3.  Synthroid 0.1 mg daily.  4.  Norvasc 2.5 mg daily.  5.  Nitrostat 0.4 mg p.r.n.   INSTRUCTIONS:  No heavy lifting/driving x2 days; maintain low fat/low  cholesterol diet; call the office if there is any swelling/bleeding of the  groin.   The patient is scheduled to follow up with Dr. Antoine Poche on September 21, 2004  at 3:30 p.m.   DISCHARGE DIAGNOSES:  1.  Nonischemic chest pain.      1.  Normal serial cardiac markers.      2.  Nonobstructive coronary artery disease/normal left ventricular          function--cardiac catheterization August 21, 2004.      3.  Status post acute inferior myocardial infarction/stent right          coronary  artery September 2001.  2.  Status post sinus tachycardia.  3.  Mildly elevated D-Dimer.  4.  Hypotension.  5.  Dyslipidemia.  6.  Hypothyroidism.  7.  Tobacco.       GS/MEDQ  D:  08/22/2004  T:  08/22/2004  Job:  [id number]   cc:   Otilio Connors. Gerri Spore, M.D.  [address]  [place]  [place] [address]  Fax: [phone]

## 2011-04-19 NOTE — Cardiovascular Report (Signed)
Adrian Gamble, NUNN NO.:  [id number]   MEDICAL RECORD NO.:  [id number]          PATIENT TYPE:  INP   LOCATION:  2023                         FACILITY:  [facility]   PHYSICIAN:  Rollene Rotunda, M.D.   DATE OF BIRTH:  1946/10/31   DATE OF PROCEDURE:  08/21/2004  DATE OF DISCHARGE:                              CARDIAC CATHETERIZATION   PRIMARY CARE PHYSICIAN:  Carola J. Gerri Spore, M.D.   PROCEDURE:  Left heart catheterization, coronary arteriography.   INDICATION:  Evaluate patient with chest pain and previous coronary disease  status post acute inferior infarct with past with stenting of his right  coronary artery.   PROCEDURE NOTE:  Left heart catheterization was performed via the right  femoral artery.  The artery was cannulated using an anterior wall puncture.  A 6 French arterial sheath was inserted via the modified Seldinger  technique.  Preformed Judkins and a pigtail catheter were utilized.  The  patient tolerated the procedure well and left the lab in stable condition.   RESULTS:   HEMODYNAMICS:  1.  LV 104/15.  2.  Aortic 103/81.   CORONARIES:  The left main had distal 25% stenosis.  The LAD had proximal  and mid calcification, proximal 25% stenosis and mid 60-70% stenosis.  This  appeared to be unchanged from  his previous cath.  The first, second and  third diagonals were small and normal.  Circumflex in the AV groove had  diffuse luminal irregularities.  The ramus intermediate was small and  normal.  OM-1 was large with diffuse proximal luminal irregularities.  OM-2  was large with mid luminal irregularities.  Posterior lateral was large and  normal.   Right coronary artery:  The right coronary artery was large dominant vessel.  Proximal stent with diffuse luminal irregularities.  There was a long mid  25% stenosis.  PDA was moderate size with proximal 25% stenosis.   LEFT VENTRICULOGRAM:  Left ventriculogram was obtained in the RAO projection  with an EF of 65%.   CONCLUSION:  1.  Nonobstructive coronary disease with patent stent in the right coronary.  2.  Good left ventricular function.   PLAN:  The patient will continue to have secondary risk reduction.       JH/MEDQ  D:  08/21/2004  T:  08/22/2004  Job:  [id number]   cc:   Otilio Connors. Gerri Spore, M.D.  [address]  [place]  [place] [address]  Fax: [phone]

## 2011-04-19 NOTE — Cardiovascular Report (Signed)
Adrian Gamble. [facility]  Patient:    Adrian Gamble, Adrian Gamble                           MRN: [id number] Proc. Date: 08/23/00 Adm. Date:  [id number] Disc. Date: [id number] Attending:  Rollene Rotunda CC:         Rollene Rotunda, M.D. [facility]  Dr. Clyde Canterbury, [facility]   Cardiac Catheterization  PROCEDURES PERFORMED: 1. Left heart catheterization. 2. Left ventriculogram. 3. Selective coronary angiography. 4. Percutaneous transluminal coronary angioplasty and stenting of the    right coronary artery.  DIAGNOSES: 1. Severe two-vessel coronary artery disease. 2. Normal left ventricular systolic function. 3. Acute inferior wall myocardial infarction.  INDICATIONS:  [name] is a 65 year old white male who presented to [facility] Emergency Room with acute onset of substernal chest discomfort while mowing his lawn.  He was found to have ST elevation in the inferior leads consistent with acute inferior wall myocardial infarction and he was transferred to [facility] for emergent cardiac catheterization.  TECHNIQUE:  After informed consent was obtained, the patient was brought to the cardiac catheterization lab where both groins were sterilely prepped and draped.  Lidocaine 1% was used to infiltrate the right groin.  A 7 French arterial and 6 French venous sheath placed using modified Seldinger technique. The 6 [place] and JR4 catheters were then used to engage the left and right coronary arteries.  Selective angiography performed in various projections using manual injections of contrast.  A 6 French pigtail catheter was then advanced in the left ventricle and the left ventriculogram performed using power injections of contrast.  FINDINGS:  Initial findings are as follows: 1. Left main trunk:  The left main trunk is a large caliber vessel with a    distal tubular narrowing of 30%. 2. Left anterior descending:  This is a large caliber vessel that provides    two  small diagonal branches.  There is a proximal narrowing of 60%.  The    mid section is then diffusely diseased with a focal narrowing of 70%.  The    remainder of the vessel has mild diffuse irregularities. 3. Ramus intermedius:  This is a small caliber vessel that bifurcates in its    mid section.  There are mild irregularities in the ramus system. 4. Left circumflex artery:  This is a large caliber vessel that provides four    marginal branches.  Mild disease is noted in the left circumflex system not    greater than 30%. 5. Right coronary artery:  Dominant.  This is a large caliber vessel that    provides the posterior descending artery and two posterior ventricular    branches in its terminal segment.  The right coronary artery is    occluded in the mid section with evidence of thrombus.  LEFT VENTRICULOGRAM:  Normal end-systolic and end-diastolic dimensions. Overall left ventricular function is well preserved.  Ejection fraction of greater than 55%.  No mitral regurgitation.  There is very mild basilar inferior wall hypokinesis.  LV pressure is 123/20, aortic is 123/80.  LVEDP equals 25.  These findings were reviewed with the patient and we elected to proceed with percutaneous intervention to the right coronary artery.  The patient had previously received heparin to maintain ACT of approximately 210 seconds.  He was supplemented with ReoPro on a weight-adjusted basis and given 300 mg of Plavix orally.  A 7 [place] guide catheter  was then used to engage the right coronary artery and a .014 inch Hi-Torque Floppy wire advanced into the distal RCA.  Repeat angiography at this point showed recanalization of the vessel with severe residual narrowing in the mid section at the site of occlusion.  There was some evidence of displacement of thrombus into the distal vessel.  A 3.0 x 20 mm Quantum Ranger balloon was introduced and used to dilate the lesion at 10 atmospheres for 30 seconds.   Repeat angiography showed improvement in vessel lumen with moderate residual narrowing of 50%.  A 3.5 x 15 mm NIR Elite stent was then carefully positioned under cineangiography and deployed in the mid section of the RCA at 14 atmospheres for 60 seconds.  Repeat angiography showed an excellent result with 0 residual narrowing and TIMI-3 flow through the distal vessel.  There was also resolution of thrombus which was previously noted in the distal branches. Final angiography was performed in various projections showing no residual stenosis or evidence of vessel damage.  There was mild diffuse disease in the mid section of the RCA of 40-50% and a focal narrowing in the origin of the larger posterior ventricular branch of approximately 50% as well.  The guide catheter was then removed and the sheath secured into position.  The patient tolerated the procedure well with transient bradycardia and hypertension with balloon angioplasty which responded to Atropine.  He was transferred to the CCU in stable condition.  FINAL RESULTS:  Successful percutaneous transluminal coronary angioplasty and stenting of the mid right coronary artery with reduction of 100% narrowing to 0% with placement of a 3.5 x 15 mm NIR Elite stent with improvement of TIMI-0 to TIMI grade 3 flow. DD:  08/23/00 TD:  08/23/00 Job: [id number]

## 2011-04-19 NOTE — Discharge Summary (Signed)
NAMECORNELIS, Adrian Gamble NO.:  [id number]   MEDICAL RECORD NO.:  [id number]          PATIENT TYPE:  INP   LOCATION:  2023                         FACILITY:  [facility]   PHYSICIAN:  Charlies Constable, M.D. [facility] DATE OF BIRTH:  27-Dec-1945   DATE OF ADMISSION:  08/21/2004  DATE OF DISCHARGE:  08/22/2004                           DISCHARGE SUMMARY - REFERRING   To discharge summary dictation # [phone] add c and d below:   1.  Nonischemic chest pain.      1.  Normal serial cardiac markers.      2.  Nonobstructive coronary artery disease/normal left ventricular          function--cardiac catheterization September 20th.      3.  Negative adenosine Cardiolite (post catheterization).      4.  Status post acute inferior myocardial infarction/stent right          coronary artery September 2001.       GS/MEDQ  D:  08/22/2004  T:  08/22/2004  Job:  [id number]

## 2011-07-17 ENCOUNTER — Other Ambulatory Visit (INDEPENDENT_AMBULATORY_CARE_PROVIDER_SITE_OTHER): Payer: 59

## 2011-07-17 DIAGNOSIS — Z0000 Encounter for general adult medical examination without abnormal findings: Secondary | ICD-10-CM

## 2011-07-17 LAB — HEPATIC FUNCTION PANEL
ALT: 39 U/L (ref 0–53)
AST: 31 U/L (ref 0–37)
Albumin: 4.2 g/dL (ref 3.5–5.2)
Alkaline Phosphatase: 56 U/L (ref 39–117)
Bilirubin, Direct: 0.1 mg/dL (ref 0.0–0.3)
Total Protein: 6.8 g/dL (ref 6.0–8.3)

## 2011-07-17 LAB — CBC WITH DIFFERENTIAL/PLATELET
Basophils Relative: 0.5 % (ref 0.0–3.0)
Eosinophils Absolute: 0.1 10*3/uL (ref 0.0–0.7)
Eosinophils Relative: 1.7 % (ref 0.0–5.0)
Hemoglobin: 15.4 g/dL (ref 13.0–17.0)
Lymphocytes Relative: 32.1 % (ref 12.0–46.0)
MCHC: 33.5 g/dL (ref 30.0–36.0)
Monocytes Relative: 9.2 % (ref 3.0–12.0)
Neutro Abs: 2.9 10*3/uL (ref 1.4–7.7)
RBC: 4.99 Mil/uL (ref 4.22–5.81)
WBC: 5.2 10*3/uL (ref 4.5–10.5)

## 2011-07-17 LAB — POCT URINALYSIS DIPSTICK
Ketones, UA: NEGATIVE
Leukocytes, UA: NEGATIVE
Nitrite, UA: NEGATIVE
Protein, UA: NEGATIVE
Urobilinogen, UA: 0.2

## 2011-07-17 LAB — TSH: TSH: 0.85 u[IU]/mL (ref 0.35–5.50)

## 2011-07-17 LAB — BASIC METABOLIC PANEL
CO2: 28 mEq/L (ref 19–32)
Calcium: 9.1 mg/dL (ref 8.4–10.5)
Sodium: 144 mEq/L (ref 135–145)

## 2011-07-17 LAB — LIPID PANEL: VLDL: 18.6 mg/dL (ref 0.0–40.0)

## 2011-07-18 ENCOUNTER — Other Ambulatory Visit: Payer: Self-pay | Admitting: Internal Medicine

## 2011-07-30 ENCOUNTER — Encounter: Payer: Self-pay | Admitting: Internal Medicine

## 2011-07-31 ENCOUNTER — Ambulatory Visit (INDEPENDENT_AMBULATORY_CARE_PROVIDER_SITE_OTHER): Payer: 59 | Admitting: Internal Medicine

## 2011-07-31 ENCOUNTER — Encounter: Payer: Self-pay | Admitting: Internal Medicine

## 2011-07-31 VITALS — BP 118/72 | HR 72 | Temp 98.1°F | Resp 16 | Ht 68.0 in | Wt 223.0 lb

## 2011-07-31 DIAGNOSIS — Z0000 Encounter for general adult medical examination without abnormal findings: Secondary | ICD-10-CM

## 2011-07-31 DIAGNOSIS — E785 Hyperlipidemia, unspecified: Secondary | ICD-10-CM

## 2011-07-31 DIAGNOSIS — K219 Gastro-esophageal reflux disease without esophagitis: Secondary | ICD-10-CM

## 2011-07-31 DIAGNOSIS — E039 Hypothyroidism, unspecified: Secondary | ICD-10-CM

## 2011-07-31 DIAGNOSIS — Z23 Encounter for immunization: Secondary | ICD-10-CM

## 2011-07-31 DIAGNOSIS — I2510 Atherosclerotic heart disease of native coronary artery without angina pectoris: Secondary | ICD-10-CM

## 2011-07-31 DIAGNOSIS — E291 Testicular hypofunction: Secondary | ICD-10-CM

## 2011-07-31 MED ORDER — NITROGLYCERIN 0.4 MG SL SUBL: 0.4000 mg | SUBLINGUAL_TABLET | SUBLINGUAL | Status: DC | PRN

## 2011-07-31 MED ORDER — SIMVASTATIN 40 MG PO TABS: 40.0000 mg | ORAL_TABLET | Freq: Every day | ORAL | Status: DC

## 2011-07-31 MED ORDER — LEVOTHYROXINE SODIUM 125 MCG PO TABS: 125.0000 ug | ORAL_TABLET | Freq: Every day | ORAL | Status: DC

## 2011-07-31 MED ORDER — TADALAFIL 20 MG PO TABS: 20.0000 mg | ORAL_TABLET | Freq: Every day | ORAL | Status: DC | PRN

## 2011-07-31 NOTE — Progress Notes (Signed)
Subjective:    Patient ID: Adrian Gamble, male    DOB: 16-Mar-1946, 65 y.o.   MRN: [id number]  HPI CC: cpx - doing well.   History of Present Illness:   65 year-old patient who has a history of coronary artery disease. He has dyslipidemia, testosterone deficiency and also a mild right cervical radiculopathy. Is doing quite well.   Here for Medicare AWV:   1. Risk factors based on Past M, S, F history: patient has known coronary artery disease, status post stenting  2. Physical Activities: no restrictions or limitations -active with golf, health club  3. Depression/mood: history depression, or mood disorder  4. Hearing: no auditory deficits  5. ADL's: completely active and independent in all aspects of daily living  6. Fall Risk: low  7. Home Safety: no problems identified  8. Height, weight, &visual acuity: increase in weight; no change in height. Her weight has had a recent eye examination and glaucoma check  9. Counseling: heart healthy diet or regular exercise encouraged as well as modest weight loss  10. Labs ordered based on risk factors: laboratory studies, including lipid profile reviewed. LDL approximately 100 Will intensify statin therapy  11. Referral Coordination- not appropriate at this time  12. Care Plan- heart healthy diet modest weight loss and exercise regimen encouraged  13. Cognitive Assessment- alert and oriented, with normal affect  Preventive Screening-Counseling & Management  Alcohol-Tobacco  Smoking Status: current  Allergies (verified):  No Known Drug Allergies  Past History:  Past Medical History:  Reviewed history from 11/07/2009 and no changes required.  Coronary artery disease status post stenting 2001  Hyperlipidemia  Peptic ulcer disease  patient admitted to the hospital for chest pain, and 2005, underwent heart catheterization and also had a negative Cardiolite stress test  GERD  testosterone deficiency  Hypothyroidism  history of impaired  glucose tolerance  Past Surgical History:  Reviewed history from 07/06/2008 and no changes required.  Tonsillectomy  colonoscopy 2005  Cardiolite stress test February 2009  Family History:  Reviewed history from 05/29/2009 and no changes required.  father age 48 memory loss  mother, age 37  One brother deceased with hepatitis and half brother, coronary artery disease  one sister at 19, is well  Social History:  Reviewed history from 07/06/2008 and no changes required.  Married  Regular exercise-no    Review of Systems  Constitutional: Negative for fever, chills, activity change, appetite change and fatigue.  HENT: Negative for hearing loss, ear pain, congestion, rhinorrhea, sneezing, mouth sores, trouble swallowing, neck pain, neck stiffness, dental problem, voice change, sinus pressure and tinnitus.   Eyes: Negative for photophobia, pain, redness and visual disturbance.  Respiratory: Negative for apnea, cough, choking, chest tightness, shortness of breath and wheezing.   Cardiovascular: Negative for chest pain, palpitations and leg swelling.  Gastrointestinal: Negative for nausea, vomiting, abdominal pain, diarrhea, constipation, blood in stool, abdominal distention, anal bleeding and rectal pain.  Genitourinary: Negative for dysuria, urgency, frequency, hematuria, flank pain, decreased urine volume, discharge, penile swelling, scrotal swelling, difficulty urinating, genital sores and testicular pain.  Musculoskeletal: Negative for myalgias, back pain, joint swelling, arthralgias and gait problem.  Skin: Negative for color change, rash and wound.  Neurological: Negative for dizziness, tremors, seizures, syncope, facial asymmetry, speech difficulty, weakness, light-headedness, numbness and headaches.       Slight numbness involving his right second and third fingers  Hematological: Negative for adenopathy. Does not bruise/bleed easily.  Psychiatric/Behavioral: Negative for suicidal  ideas, hallucinations, behavioral  problems, confusion, sleep disturbance, self-injury, dysphoric mood, decreased concentration and agitation. The patient is not nervous/anxious.        Objective:   Physical Exam  Constitutional: He appears well-developed and well-nourished.  HENT:  Head: Normocephalic and atraumatic.  Right Ear: External ear normal.  Left Ear: External ear normal.  Nose: Nose normal.  Mouth/Throat: Oropharynx is clear and moist.  Eyes: Conjunctivae and EOM are normal. Pupils are equal, round, and reactive to light. No scleral icterus.  Neck: Normal range of motion. Neck supple. No JVD present. No thyromegaly present.  Cardiovascular: Regular rhythm, normal heart sounds and intact distal pulses.  Exam reveals no gallop and no friction rub.   No murmur heard.      Right supraclavicular bruit  Dorsalis pedis pulses are faint posterior tibial pulses are full  Pulmonary/Chest: Effort normal and breath sounds normal. He exhibits no tenderness.  Abdominal: Soft. Bowel sounds are normal. He exhibits no distension and no mass. There is no tenderness.  Genitourinary: Prostate normal and penis normal.  Musculoskeletal: Normal range of motion. He exhibits no edema and no tenderness.  Lymphadenopathy:    He has no cervical adenopathy.  Neurological: He is alert. He has normal reflexes. No cranial nerve deficit. Coordination normal.  Skin: Skin is warm and dry. No rash noted.  Psychiatric: He has a normal mood and affect. His behavior is normal.          Assessment & Plan:   Preventive health examination Coronary artery disease stable Dyslipidemia  His present regimen medications refilled including nitroglycerin return here in 6 months or when necessary. Cardiology followup encouraged

## 2011-07-31 NOTE — Patient Instructions (Signed)
Limit your sodium (Salt) intake    It is important that you exercise regularly, at least 20 minutes 3 to 4 times per week.  If you develop chest pain or shortness of breath seek  medical attention.  Return in 6 months for follow-up  

## 2011-11-01 ENCOUNTER — Ambulatory Visit (INDEPENDENT_AMBULATORY_CARE_PROVIDER_SITE_OTHER): Payer: 59 | Admitting: Internal Medicine

## 2011-11-01 DIAGNOSIS — Z23 Encounter for immunization: Secondary | ICD-10-CM

## 2011-11-01 DIAGNOSIS — Z0000 Encounter for general adult medical examination without abnormal findings: Secondary | ICD-10-CM

## 2012-02-24 ENCOUNTER — Encounter: Payer: Self-pay | Admitting: Internal Medicine

## 2012-02-24 ENCOUNTER — Ambulatory Visit (INDEPENDENT_AMBULATORY_CARE_PROVIDER_SITE_OTHER): Payer: 59 | Admitting: Internal Medicine

## 2012-02-24 VITALS — BP 110/76 | Temp 98.3°F | Wt 223.0 lb

## 2012-02-24 DIAGNOSIS — R7309 Other abnormal glucose: Secondary | ICD-10-CM

## 2012-02-24 DIAGNOSIS — I2510 Atherosclerotic heart disease of native coronary artery without angina pectoris: Secondary | ICD-10-CM

## 2012-02-24 DIAGNOSIS — E039 Hypothyroidism, unspecified: Secondary | ICD-10-CM

## 2012-02-24 DIAGNOSIS — E785 Hyperlipidemia, unspecified: Secondary | ICD-10-CM

## 2012-02-24 DIAGNOSIS — R7302 Impaired glucose tolerance (oral): Secondary | ICD-10-CM | POA: Insufficient documentation

## 2012-02-24 NOTE — Progress Notes (Signed)
  Subjective:    Patient ID: Adrian Gamble, male    DOB: [birth year], 66 y.o.   MRN: [id number]  HPI  66 year old patient who is seen today for followup. He is has a history of coronary artery disease status post MI in 2001. He has done quite well. He has dyslipidemia and a history of hypothyroidism. TSH was normal in August of last year. He has testosterone deficiency and a history of peptic ulcer disease. He also has a history of exogenous obesity. Today is 223 unchanged from 6 months ago. He is an infrequent visitor to the [facility].    Review of Systems  Constitutional: Negative for fever, chills, appetite change and fatigue.  HENT: Negative for hearing loss, ear pain, congestion, sore throat, trouble swallowing, neck stiffness, dental problem, voice change and tinnitus.   Eyes: Negative for pain, discharge and visual disturbance.  Respiratory: Negative for cough, chest tightness, wheezing and stridor.   Cardiovascular: Negative for chest pain, palpitations and leg swelling.  Gastrointestinal: Negative for nausea, vomiting, abdominal pain, diarrhea, constipation, blood in stool and abdominal distention.  Genitourinary: Negative for urgency, hematuria, flank pain, discharge, difficulty urinating and genital sores.  Musculoskeletal: Negative for myalgias, back pain, joint swelling, arthralgias and gait problem.  Skin: Negative for rash.  Neurological: Negative for dizziness, syncope, speech difficulty, weakness, numbness and headaches.  Hematological: Negative for adenopathy. Does not bruise/bleed easily.  Psychiatric/Behavioral: Negative for behavioral problems and dysphoric mood. The patient is not nervous/anxious.        Objective:   Physical Exam  Constitutional: He is oriented to person, place, and time. He appears well-developed.  HENT:  Head: Normocephalic.  Right Ear: External ear normal.  Left Ear: External ear normal.  Eyes: Conjunctivae and EOM are normal.  Neck: Normal range of  motion.  Cardiovascular: Normal rate and normal heart sounds.   Pulmonary/Chest: Breath sounds normal.  Abdominal: Bowel sounds are normal.  Musculoskeletal: Normal range of motion. He exhibits no edema and no tenderness.  Neurological: He is alert and oriented to person, place, and time.  Psychiatric: He has a normal mood and affect. His behavior is normal.          Assessment & Plan:   Coronary artery disease. Clinically stable Impaired glucose tolerance with fasting blood sugar 127 Exogenous obesity Dyslipidemia   Exercise weight loss encouraged. Will recheck in 6 months

## 2012-02-24 NOTE — Patient Instructions (Signed)
Limit your sodium (Salt) intake    It is important that you exercise regularly, at least 20 minutes 3 to 4 times per week.  If you develop chest pain or shortness of breath seek  medical attention.  You need to lose weight.  Consider a lower calorie diet and regular exercise. 

## 2012-02-24 NOTE — Progress Notes (Signed)
  Subjective:    Patient ID: Adrian Gamble, male    DOB: September 18, 1946, 66 y.o.   MRN: [id number]  HPI  Wt Readings from Last 3 Encounters:  02/24/12 223 lb (101.152 kg)  07/31/11 223 lb (101.152 kg)  05/31/10 213 lb (96.616 kg)    Review of Systems     Objective:   Physical Exam        Assessment & Plan:

## 2012-05-26 ENCOUNTER — Ambulatory Visit (INDEPENDENT_AMBULATORY_CARE_PROVIDER_SITE_OTHER): Payer: 59 | Admitting: Cardiology

## 2012-05-26 ENCOUNTER — Encounter: Payer: Self-pay | Admitting: Cardiology

## 2012-05-26 ENCOUNTER — Encounter: Payer: Self-pay | Admitting: *Deleted

## 2012-05-26 VITALS — BP 146/78 | HR 76 | Ht 68.0 in | Wt 225.0 lb

## 2012-05-26 DIAGNOSIS — E785 Hyperlipidemia, unspecified: Secondary | ICD-10-CM

## 2012-05-26 DIAGNOSIS — R079 Chest pain, unspecified: Secondary | ICD-10-CM

## 2012-05-26 DIAGNOSIS — I2510 Atherosclerotic heart disease of native coronary artery without angina pectoris: Secondary | ICD-10-CM

## 2012-05-26 MED ORDER — METOPROLOL TARTRATE 25 MG PO TABS: 25.0000 mg | ORAL_TABLET | Freq: Two times a day (BID) | ORAL | Status: DC

## 2012-05-26 MED ORDER — NITROGLYCERIN 0.4 MG SL SUBL: 0.4000 mg | SUBLINGUAL_TABLET | SUBLINGUAL | Status: DC | PRN

## 2012-05-26 NOTE — Assessment & Plan Note (Signed)
Lab Results  Component Value Date   CHOL 135 07/17/2011   HDL 36.40* 07/17/2011   LDLCALC 80 07/17/2011   TRIG 93.0 07/17/2011   CHOLHDL 4 07/17/2011   He will continue the therapy as listed and we will repeat a lipid profile when he comes back fasting.

## 2012-05-26 NOTE — Progress Notes (Signed)
HPI The patient has not been back to this clinic in some time. He has a history of coronary disease status post stenting to his right coronary artery in 2001. His last catheterization in 2005 demonstrated LAD stable 60-70% stenosis and a patent stent. He had a stress perfusion study in 2009 demonstrating an EF of 66% with no ischemia or infarct. However, over the past several months he's been having exertional chest discomfort. This is happening with somewhat more intensity with less exertion recently. He thinks it might be like his previous angina though not nearly as severe. It is also somewhat like his previous reflux. He's had some discomfort at rest. The most severe episode happened while carrying a suitcase up some stairs. It took him 5 minutes to recover from this. He was a moderate substernal burning without radiation to his neck or to his arms. There was no associated symptoms. It when we spontaneously. He's had some decreased exercise tolerance slowly progressive and some increased dyspnea with activities as well. He's not describing PND or orthopnea.  No Known Allergies  Current Outpatient Prescriptions  Medication Sig Dispense Refill  . aspirin 325 MG tablet Take 325 mg by mouth daily.        Marland Kitchen levothyroxine (SYNTHROID, LEVOTHROID) 125 MCG tablet Take 1 tablet (125 mcg total) by mouth daily.  90 tablet  6  . nitroGLYCERIN (NITROSTAT) 0.4 MG SL tablet Place 1 tablet (0.4 mg total) under the tongue every 5 (five) minutes as needed for chest pain.  90 tablet  12  . simvastatin (ZOCOR) 40 MG tablet Take 1 tablet (40 mg total) by mouth at bedtime.  90 tablet  6  . tadalafil (CIALIS) 20 MG tablet Take 1 tablet (20 mg total) by mouth daily as needed.  10 tablet  6    Past Medical History  Diagnosis Date  . CORONARY ARTERY DISEASE 09/01/2007  . GERD 09/01/2007  . HYPERLIPIDEMIA 09/01/2007  . HYPOTHYROIDISM 05/29/2009  . PEPTIC ULCER DISEASE 09/01/2007  . 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  Impaired glucose tolerance     Past Surgical History  Procedure Date  . Coronary stent placement 2001    RCA  . Tonsillectomy     Family History  Problem Relation Age of Onset  . Memory loss Father   . Coronary artery disease Father 74    cabg    History   Social History  . Marital Status: Married    Spouse Name: N/A    Number of Children: N/A  . Years of Education: N/A   Occupational History  . Retired    Social History Main Topics  . Smoking status: Current Some Day Smoker    Types: Cigarettes  . Smokeless tobacco: Never Used   Comment: Distant cigarettes.  Occasional pipe  . Alcohol Use: No  . Drug Use: No  . Sexually Active: Not on file   Other Topics Concern  . Not on file   Social History Narrative  . No narrative on file    ROS:  As stated in the HPI and negative for all other systems.   PHYSICAL EXAM BP 146/78  Pulse 76  Ht 5\' 8"  (1.727 m)  Wt 225 lb (102.059 kg)  BMI 34.21 kg/m2 GENERAL:  Well appearing HEENT:  Pupils equal round and reactive, fundi not visualized, oral mucosa unremarkable NECK:  No jugular venous distention, waveform within normal limits, carotid upstroke brisk and symmetric, no bruits, no thyromegaly LYMPHATICS:  No cervical, inguinal adenopathy  LUNGS:  Clear to auscultation bilaterally BACK:  No CVA tenderness CHEST:  Unremarkable HEART:  PMI not displaced or sustained,S1 and S2 within normal limits, no S3, no S4, no clicks, no rubs, no murmurs ABD:  Flat, positive bowel sounds normal in frequency in pitch, no bruits, no rebound, no guarding, no midline pulsatile mass, no hepatomegaly, no splenomegaly, obese EXT:  2 plus pulses throughout, no edema, no cyanosis no clubbing SKIN:  No rashes no nodules NEURO:  Cranial nerves II through XII grossly intact, motor grossly intact throughout PSYCH:  Cognitively intact, oriented to person place and time   EKG:  Sinus rhythm, rate 76, axis within normal limits, intervals within  normal limits, no acute ST-T wave changes.  05/26/2012    ASSESSMENT AND PLAN

## 2012-05-26 NOTE — Patient Instructions (Addendum)
Please start metoprolol 25 mg one twice a day Use SL Ntg as directed for chest pain Continue all other medications as listed  Your physician has requested that you have a cardiac catheterization. Cardiac catheterization is used to diagnose and/or treat various heart conditions. Doctors may recommend this procedure for a number of different reasons. The most common reason is to evaluate chest pain. Chest pain can be a symptom of coronary artery disease (CAD), and cardiac catheterization can show whether plaque is narrowing or blocking your heart's arteries. This procedure is also used to evaluate the valves, as well as measure the blood flow and oxygen levels in different parts of your heart. For further information please visit [email or web address] Please follow instruction sheet, as given.  Follow up will be scheduled after your testing.

## 2012-05-26 NOTE — Assessment & Plan Note (Signed)
The patient's symptoms are consistent unstable angina.  I think that there is a high pretest probability of obstructive coronary disease. Therefore, cardiac catheterization is indicated. The patient understands that risks included but are not limited to stroke (1 in 1000), death (1 in 1000), kidney failure [usually temporary] (1 in 500), bleeding (1 in 200), allergic reaction [possibly serious] (1 in 200).  The patient understands and agrees to proceed.

## 2012-05-27 LAB — BASIC METABOLIC PANEL
Calcium: 9.9 mg/dL (ref 8.4–10.5)
GFR: 90.42 mL/min (ref >60.00)
Potassium: 3.8 mEq/L (ref 3.5–5.1)
Sodium: 139 mEq/L (ref 135–145)

## 2012-05-27 LAB — CBC
Hemoglobin: 15.4 g/dL (ref 13.0–17.0)
Platelets: 173 10*3/uL (ref 150.0–400.0)
RDW: 13 % (ref 11.5–14.6)
WBC: 6.7 10*3/uL (ref 4.5–10.5)

## 2012-05-27 LAB — PROTIME-INR: INR: 1 ratio (ref 0.8–1.0)

## 2012-05-31 ENCOUNTER — Other Ambulatory Visit: Payer: Self-pay | Admitting: Cardiology

## 2012-05-31 DIAGNOSIS — I200 Unstable angina: Secondary | ICD-10-CM

## 2012-06-01 ENCOUNTER — Ambulatory Visit (HOSPITAL_COMMUNITY): Admit: 2012-06-01 | Payer: Self-pay | Admitting: Cardiovascular Disease

## 2012-06-01 ENCOUNTER — Other Ambulatory Visit: Payer: Self-pay

## 2012-06-01 ENCOUNTER — Inpatient Hospital Stay (HOSPITAL_BASED_OUTPATIENT_CLINIC_OR_DEPARTMENT_OTHER)
Admission: RE | Admit: 2012-06-01 | Discharge: 2012-06-01 | Disposition: A | Discharge status: Hospice | Payer: Medicare HMO | Source: Ambulatory Visit | Attending: Cardiovascular Disease | Admitting: Cardiovascular Disease

## 2012-06-01 ENCOUNTER — Inpatient Hospital Stay (HOSPITAL_COMMUNITY)
Admission: AD | Admit: 2012-06-01 | Discharge: 2012-06-02 | DRG: 251 | Disposition: A | Discharge status: Home / Self Care | Payer: Medicare HMO | Source: Ambulatory Visit | Attending: Cardiovascular Disease | Admitting: Cardiovascular Disease

## 2012-06-01 ENCOUNTER — Encounter (HOSPITAL_COMMUNITY): Payer: Self-pay | Admitting: *Deleted

## 2012-06-01 ENCOUNTER — Encounter (HOSPITAL_COMMUNITY): Payer: Self-pay

## 2012-06-01 ENCOUNTER — Encounter (HOSPITAL_COMMUNITY)
Admission: AD | Disposition: A | Discharge status: Home / Self Care | Payer: Self-pay | Source: Ambulatory Visit | Attending: Cardiovascular Disease

## 2012-06-01 ENCOUNTER — Encounter (HOSPITAL_BASED_OUTPATIENT_CLINIC_OR_DEPARTMENT_OTHER)
Admission: RE | Disposition: A | Discharge status: Hospice | Payer: Self-pay | Source: Ambulatory Visit | Attending: Cardiovascular Disease

## 2012-06-01 DIAGNOSIS — E785 Hyperlipidemia, unspecified: Secondary | ICD-10-CM | POA: Diagnosis present

## 2012-06-01 DIAGNOSIS — F17200 Nicotine dependence, unspecified, uncomplicated: Secondary | ICD-10-CM | POA: Diagnosis present

## 2012-06-01 DIAGNOSIS — E291 Testicular hypofunction: Secondary | ICD-10-CM | POA: Diagnosis present

## 2012-06-01 DIAGNOSIS — I200 Unstable angina: Secondary | ICD-10-CM

## 2012-06-01 DIAGNOSIS — Z9861 Coronary angioplasty status: Secondary | ICD-10-CM | POA: Insufficient documentation

## 2012-06-01 DIAGNOSIS — I2584 Coronary atherosclerosis due to calcified coronary lesion: Secondary | ICD-10-CM | POA: Diagnosis present

## 2012-06-01 DIAGNOSIS — Z8711 Personal history of peptic ulcer disease: Secondary | ICD-10-CM

## 2012-06-01 DIAGNOSIS — K449 Diaphragmatic hernia without obstruction or gangrene: Secondary | ICD-10-CM | POA: Diagnosis present

## 2012-06-01 DIAGNOSIS — Z9089 Acquired absence of other organs: Secondary | ICD-10-CM

## 2012-06-01 DIAGNOSIS — I2510 Atherosclerotic heart disease of native coronary artery without angina pectoris: Principal | ICD-10-CM | POA: Diagnosis present

## 2012-06-01 DIAGNOSIS — R7309 Other abnormal glucose: Secondary | ICD-10-CM | POA: Diagnosis present

## 2012-06-01 DIAGNOSIS — E039 Hypothyroidism, unspecified: Secondary | ICD-10-CM | POA: Diagnosis present

## 2012-06-01 DIAGNOSIS — K219 Gastro-esophageal reflux disease without esophagitis: Secondary | ICD-10-CM | POA: Diagnosis present

## 2012-06-01 HISTORY — DX: Sleep apnea, unspecified: G47.30

## 2012-06-01 HISTORY — PX: PERCUTANEOUS CORONARY STENT INTERVENTION (PCI-S): SHX5485

## 2012-06-01 HISTORY — DX: Personal history of other diseases of the digestive system: Z87.19

## 2012-06-01 SURGERY — PERCUTANEOUS CORONARY STENT INTERVENTION (PCI-S)
Anesthesia: LOCAL

## 2012-06-01 SURGERY — JV LEFT HEART CATHETERIZATION WITH CORONARY ANGIOGRAM
Anesthesia: Moderate Sedation

## 2012-06-01 MED ORDER — SODIUM CHLORIDE 0.9 % IV SOLN: INTRAVENOUS | Status: DC

## 2012-06-01 MED ORDER — ASPIRIN 325 MG PO TABS
325.0000 mg | ORAL_TABLET | Freq: Every day | ORAL | Status: DC
  Filled 2012-06-01: qty 1

## 2012-06-01 MED ORDER — NITROGLYCERIN 0.4 MG SL SUBL: 0.4000 mg | SUBLINGUAL_TABLET | SUBLINGUAL | Status: DC | PRN

## 2012-06-01 MED ORDER — HEPARIN (PORCINE) IN NACL 2-0.9 UNIT/ML-% IJ SOLN
INTRAMUSCULAR | Status: AC
  Filled 2012-06-01: qty 2000

## 2012-06-01 MED ORDER — ADENOSINE 12 MG/4ML IV SOLN
16.0000 mL | Freq: Once | INTRAVENOUS | Status: DC
  Filled 2012-06-01: qty 16

## 2012-06-01 MED ORDER — SODIUM CHLORIDE 0.9 % IJ SOLN: 3.0000 mL | Freq: Two times a day (BID) | INTRAMUSCULAR | Status: DC

## 2012-06-01 MED ORDER — METOPROLOL TARTRATE 25 MG PO TABS
25.0000 mg | ORAL_TABLET | Freq: Two times a day (BID) | ORAL | Status: DC
  Administered 2012-06-01 – 2012-06-02 (×2): 25 mg via ORAL
  Filled 2012-06-01 (×4): qty 1

## 2012-06-01 MED ORDER — ACETAMINOPHEN 325 MG PO TABS: 650.0000 mg | ORAL_TABLET | ORAL | Status: DC | PRN

## 2012-06-01 MED ORDER — CLOPIDOGREL BISULFATE 300 MG PO TABS
600.0000 mg | ORAL_TABLET | Freq: Once | ORAL | Status: AC
  Administered 2012-06-01: 600 mg via ORAL

## 2012-06-01 MED ORDER — FENTANYL CITRATE 0.05 MG/ML IJ SOLN
INTRAMUSCULAR | Status: AC
  Filled 2012-06-01: qty 2

## 2012-06-01 MED ORDER — NITROGLYCERIN 0.2 MG/ML ON CALL CATH LAB
INTRAVENOUS | Status: AC
  Filled 2012-06-01: qty 1

## 2012-06-01 MED ORDER — ONDANSETRON HCL 4 MG/2ML IJ SOLN: 4.0000 mg | Freq: Four times a day (QID) | INTRAMUSCULAR | Status: DC | PRN

## 2012-06-01 MED ORDER — LIDOCAINE HCL (PF) 1 % IJ SOLN
INTRAMUSCULAR | Status: AC
  Filled 2012-06-01: qty 30

## 2012-06-01 MED ORDER — SODIUM CHLORIDE 0.9 % IV SOLN
0.2500 mg/kg/h | INTRAVENOUS | Status: DC
  Filled 2012-06-01: qty 250

## 2012-06-01 MED ORDER — SODIUM CHLORIDE 0.9 % IV SOLN: INTRAVENOUS | Status: AC

## 2012-06-01 MED ORDER — LEVOTHYROXINE SODIUM 125 MCG PO TABS
125.0000 ug | ORAL_TABLET | Freq: Every day | ORAL | Status: DC
  Filled 2012-06-01 (×2): qty 1

## 2012-06-01 MED ORDER — SODIUM CHLORIDE 0.9 % IV SOLN: 250.0000 mL | INTRAVENOUS | Status: DC | PRN

## 2012-06-01 MED ORDER — LEVOTHYROXINE SODIUM 125 MCG PO TABS
125.0000 ug | ORAL_TABLET | Freq: Every day | ORAL | Status: DC
  Filled 2012-06-01: qty 1

## 2012-06-01 MED ORDER — SIMVASTATIN 40 MG PO TABS
40.0000 mg | ORAL_TABLET | Freq: Every day | ORAL | Status: DC
  Administered 2012-06-01: 40 mg via ORAL
  Filled 2012-06-01 (×2): qty 1

## 2012-06-01 MED ORDER — MIDAZOLAM HCL 2 MG/2ML IJ SOLN
INTRAMUSCULAR | Status: AC
  Filled 2012-06-01: qty 2

## 2012-06-01 MED ORDER — ASPIRIN 81 MG PO CHEW
324.0000 mg | CHEWABLE_TABLET | ORAL | Status: AC
  Administered 2012-06-01: 324 mg via ORAL

## 2012-06-01 MED ORDER — BIVALIRUDIN BOLUS VIA INFUSION
0.1000 mg/kg | Freq: Once | INTRAVENOUS | Status: DC
  Filled 2012-06-01: qty 11

## 2012-06-01 MED ORDER — SODIUM CHLORIDE 0.9 % IJ SOLN: 3.0000 mL | INTRAMUSCULAR | Status: DC | PRN

## 2012-06-01 MED ORDER — BIVALIRUDIN 250 MG IV SOLR
INTRAVENOUS | Status: AC
  Filled 2012-06-01: qty 250

## 2012-06-01 NOTE — CV Procedure (Signed)
   Cardiac Catheterization Operative Report  Garl Speigner [id number] 7/1/201311:17 AM Rogelia Boga, MD  Procedure Performed:  1. Left Heart Catheterization 2. Selective Coronary Angiography 3. Left ventricular angiogram  Operator: Verne Carrow, MD  Indication:  Unstable angina, known CAD.                                      Procedure Details: The risks, benefits, complications, treatment options, and expected outcomes were discussed with the patient. The patient and/or family concurred with the proposed plan, giving informed consent. The patient was brought to the outpatient cath lab after IV hydration was begun and oral premedication was given. The patient was further sedated with Versed and Fentanyl. The right groin was prepped and draped in the usual manner. Using the modified Seldinger access technique, a 4 French sheath was placed in the right femoral artery. A 3DRC catheter was used to engage the RCA. A JL4 catheter was used to engage the left main. Selective coronary angiography was performed. A pigtail catheter was used to perform a left ventricular angiogram.  There were no immediate complications. The patient was taken to the recovery area in stable condition.   Hemodynamic Findings: Central aortic pressure: 98/59 Left ventricular pressure: 94/9/13  Angiographic Findings:  Left main: 20% mid stenosis.   Left Anterior Descending Artery: Large caliber vessel that tapers to a smaller caliber vessel in the mid portion. The proximal vessel has mild luminal irregularities. There is a stable 60% stenosis in the mid vessel with moderate calcification. This appears to be unchanged from last cath and does not appear to flow limiting. The distal vessel has no obstructive disease noted. There are two very small caliber diagonal branches.   Circumflex Artery: Large caliber vessel with 3 marginal branches. The first obtuse marginal branch is a moderate sized vessel that  appears to have 80% proximal stenosis extending back to the ostium. The second obtuse marginal branch has diffuse mild plaque. The AV groove Circumflex has a 40% stenosis in the mid segment leading into the third obtuse marginal branch.   Right Coronary Artery: Large, dominant vessel with patent stent in the proximal segment. There is minimal restenosis in the stent. The mid vessel has a 60-70% moderate stenosis with mild calcification. In multiple different views, this appears to be moderate. The distal vessel has mild luminal irregularities.   Left Ventricular Angiogram: LVEF 55-60%.   Impression: 1. Triple vessel CAD with severe stenosis in the obtuse marginal branch of the Circumflex, moderate stenosis in the mid RCA and stable moderate stenosis in the mid LAD 2. Unstable angina which is most likely secondary to the stenosis in the obtuse marginal branch 3. Preserved LV systolic function  Recommendations: Will load with Plavix 600 mg po x 1 now and plan PCI of the OM lesion in the inpatient lab and flow wire (FFR) analysis of the RCA lesion.        Complications:  None. The patient tolerated the procedure well.

## 2012-06-01 NOTE — H&P (View-Only) (Signed)
 HPI The patient has not been back to this clinic in some time. He has a history of coronary disease status post stenting to his right coronary artery in 2001. His last catheterization in 2005 demonstrated LAD stable 60-70% stenosis and a patent stent. He had a stress perfusion study in 2009 demonstrating an EF of 66% with no ischemia or infarct. However, over the past several months he's been having exertional chest discomfort. This is happening with somewhat more intensity with less exertion recently. He thinks it might be like his previous angina though not nearly as severe. It is also somewhat like his previous reflux. He's had some discomfort at rest. The most severe episode happened while carrying a suitcase up some stairs. It took him 5 minutes to recover from this. He was a moderate substernal burning without radiation to his neck or to his arms. There was no associated symptoms. It when we spontaneously. He's had some decreased exercise tolerance slowly progressive and some increased dyspnea with activities as well. He's not describing PND or orthopnea.  No Known Allergies  Current Outpatient Prescriptions  Medication Sig Dispense Refill  . aspirin 325 MG tablet Take 325 mg by mouth daily.        . levothyroxine (SYNTHROID, LEVOTHROID) 125 MCG tablet Take 1 tablet (125 mcg total) by mouth daily.  90 tablet  6  . nitroGLYCERIN (NITROSTAT) 0.4 MG SL tablet Place 1 tablet (0.4 mg total) under the tongue every 5 (five) minutes as needed for chest pain.  90 tablet  12  . simvastatin (ZOCOR) 40 MG tablet Take 1 tablet (40 mg total) by mouth at bedtime.  90 tablet  6  . tadalafil (CIALIS) 20 MG tablet Take 1 tablet (20 mg total) by mouth daily as needed.  10 tablet  6    Past Medical History  Diagnosis Date  . CORONARY ARTERY DISEASE 09/01/2007  . GERD 09/01/2007  . HYPERLIPIDEMIA 09/01/2007  . HYPOTHYROIDISM 05/29/2009  . PEPTIC ULCER DISEASE 09/01/2007  . 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  Impaired glucose tolerance     Past Surgical History  Procedure Date  . Coronary stent placement 2001    RCA  . Tonsillectomy     Family History  Problem Relation Age of Onset  . Memory loss Father   . Coronary artery disease Father 75    cabg    History   Social History  . Marital Status: Married    Spouse Name: N/A    Number of Children: N/A  . Years of Education: N/A   Occupational History  . Retired    Social History Main Topics  . Smoking status: Current Some Day Smoker    Types: Cigarettes  . Smokeless tobacco: Never Used   Comment: Distant cigarettes.  Occasional pipe  . Alcohol Use: No  . Drug Use: No  . Sexually Active: Not on file   Other Topics Concern  . Not on file   Social History Narrative  . No narrative on file    ROS:  As stated in the HPI and negative for all other systems.   PHYSICAL EXAM BP 146/78  Pulse 76  Ht 5' 8" (1.727 m)  Wt 225 lb (102.059 kg)  BMI 34.21 kg/m2 GENERAL:  Well appearing HEENT:  Pupils equal round and reactive, fundi not visualized, oral mucosa unremarkable NECK:  No jugular venous distention, waveform within normal limits, carotid upstroke brisk and symmetric, no bruits, no thyromegaly LYMPHATICS:  No cervical, inguinal adenopathy   LUNGS:  Clear to auscultation bilaterally BACK:  No CVA tenderness CHEST:  Unremarkable HEART:  PMI not displaced or sustained,S1 and S2 within normal limits, no S3, no S4, no clicks, no rubs, no murmurs ABD:  Flat, positive bowel sounds normal in frequency in pitch, no bruits, no rebound, no guarding, no midline pulsatile mass, no hepatomegaly, no splenomegaly, obese EXT:  2 plus pulses throughout, no edema, no cyanosis no clubbing SKIN:  No rashes no nodules NEURO:  Cranial nerves II through XII grossly intact, motor grossly intact throughout PSYCH:  Cognitively intact, oriented to person place and time   EKG:  Sinus rhythm, rate 76, axis within normal limits, intervals within  normal limits, no acute ST-T wave changes.  05/26/2012    ASSESSMENT AND PLAN    

## 2012-06-01 NOTE — Interval H&P Note (Signed)
History and Physical Interval Note:  06/01/2012 9:54 AM  Adrian Gamble  has presented today for surgery, with the diagnosis of cp  The various methods of treatment have been discussed with the patient and family. After consideration of risks, benefits and other options for treatment, the patient has consented to  Procedure(s) (LRB): JV LEFT HEART CATHETERIZATION WITH CORONARY ANGIOGRAM (N/A) as a surgical intervention .  The patient's history has been reviewed, patient examined, no change in status, stable for surgery.  I have reviewed the patients' chart and labs.  Questions were answered to the patient's satisfaction.     Karman Biswell

## 2012-06-01 NOTE — CV Procedure (Signed)
Cardiac Catheterization Operative Report  Adrian Gamble [id number] 7/1/20132:19 PM Rogelia Boga, MD  Procedure Performed:  1. Attempted PTCA of the severe stenosis in the moderate sized obtuse marginal branch of the Circumflex 2. Fractional Flow Reserve of the LAD  Operator: Verne Carrow, MD  Indication:  Unstable angina, known CAD. Diagnostic cath this am. Pt found to have severe stenosis of the first OM branch, and moderately severe stenosis in the mid LAD and mid RCA.                                      Procedure Details: The risks, benefits, complications, treatment options, and expected outcomes were discussed with the patient. The patient and/or family concurred with the proposed plan, giving informed consent. The patient was brought to the inpatient cath lab from the outpatient cath lab.  The patient was further sedated with Versed and Fentanyl. There was a 4 [place] sheath present in the right femoral artery. The right groin was prepped and draped in the usual manner. The 4 French sheath was exchanged for a 6 French sheath in the right femoral artery. The patient had been loaded with Plavix prior to the intervention. He was given a bolus of Angiomax and a drip was started. I then engaged the left main with a XB LAD 3.5 guiding catheter. When the ACT was greater than 200, I attempted to pass a Cougar IC wire down the Circumflex into the first OM branch. The first OM branch had severe stenosis extending from the ostium through the proximal vessel. I was unable to pass the wire down the OM branch. I changed out for a Whisper wire and used an over the wire balloon for support but was not able to pass the wire down the vessel secondary to the acute takeoff, proximal location and disease in the ostium of the vessel. I felt that his obtuse marginal lesion is probably the culprit vessel. At this point, I felt that it was important to establish reduction of flow down the LAD  secondary to the moderately severe stenosis in the mid vessel.  I removed the wires from the Circumflex and passed a pressure wire down the LAD. Baseline Fractional Flow Reserve of 0.90. He was started on an adenosine drip. With adenosine infusion, FFR dropped to 0.82-0.83. This suggested that the flow down the LAD is inhibited but in the intermediate zone for diagnosis of a severely flow limiting lesion. He did complain of severe chest pressure and dyspnea with adenosine infusion. The stenosis in the RCA angiographically appears to be moderate severe. I was unable to perform FFR of the RCA secondary to the patient's intolerance to adenosine which was exhibited during infusion for FFR of the LAD.   There were no immediate complications. The patient was taken to the recovery area in stable condition.   Hemodynamic Findings: Central aortic pressure: 112/68  Impression: 1. Unstable angina with severe stenosis in ostium of first obtuse marginal branch which could not be approached with PCI secondary to factors listed above 2. LAD stenosis and RCA stenosis in the moderately severe to severe range.  Recommendations: He has triple vessel CAD. His culprit vessel is likely the obtuse marginal branch which could not be revascularized with PCI as noted above secondary to vessel tortuosity, acute takeoff and severity of stenosis. I believe the lesion in the RCA is severe and the lesion in the mid  LAD is at least moderately severe as demonstrated by FFR. I think he would benefit from multi-vessel revascularization with CABG. He will be admitted tonight. Will d/c Plavix. He will need 5 days of Plavix washout. Symptoms are such that he may be able to be discharged home for Plavix washout while awaiting CABG.        Complications:  None; patient tolerated the procedure well.

## 2012-06-01 NOTE — Progress Notes (Signed)
Site area: right groin  Site Prior to Removal:  Level 0  Pressure Applied For 35 MINUTES    Minutes Beginning at 1740  Manual:   yes  Patient Status During Pull:  AAO X3  Post Pull Groin Site:  Level 0  Post Pull Instructions Given:  yes  Post Pull Pulses Present:  yes  Dressing Applied:  yes  Comments:  Tolerated procedure well

## 2012-06-02 ENCOUNTER — Other Ambulatory Visit: Payer: Self-pay

## 2012-06-02 ENCOUNTER — Inpatient Hospital Stay (HOSPITAL_COMMUNITY): Payer: Medicare HMO

## 2012-06-02 ENCOUNTER — Encounter (HOSPITAL_COMMUNITY): Payer: Self-pay | Admitting: *Deleted

## 2012-06-02 DIAGNOSIS — Z01810 Encounter for preprocedural cardiovascular examination: Secondary | ICD-10-CM

## 2012-06-02 DIAGNOSIS — I517 Cardiomegaly: Secondary | ICD-10-CM

## 2012-06-02 DIAGNOSIS — I2510 Atherosclerotic heart disease of native coronary artery without angina pectoris: Principal | ICD-10-CM

## 2012-06-02 LAB — PULMONARY FUNCTION TEST

## 2012-06-02 LAB — LIPID PANEL
LDL Cholesterol: 89 mg/dL (ref 0–99)
Total CHOL/HDL Ratio: 6.1 RATIO
VLDL: 43 mg/dL — ABNORMAL HIGH (ref 0–40)

## 2012-06-02 LAB — BASIC METABOLIC PANEL
BUN: 13 mg/dL (ref 6–23)
Calcium: 8.9 mg/dL (ref 8.4–10.5)
GFR calc non Af Amer: 75 mL/min — ABNORMAL LOW (ref >90)
Glucose, Bld: 129 mg/dL — ABNORMAL HIGH (ref 70–99)

## 2012-06-02 LAB — CBC
HCT: 43.5 % (ref 39.0–52.0)
Hemoglobin: 15.2 g/dL (ref 13.0–17.0)
MCH: 30.8 pg (ref 26.0–34.0)
MCHC: 34.9 g/dL (ref 30.0–36.0)
MCV: 88.2 fL (ref 78.0–100.0)

## 2012-06-02 MED ORDER — ALBUTEROL SULFATE (5 MG/ML) 0.5% IN NEBU
2.5000 mg | INHALATION_SOLUTION | Freq: Once | RESPIRATORY_TRACT | Status: AC
  Administered 2012-06-02: 2.5 mg via RESPIRATORY_TRACT

## 2012-06-02 MED ORDER — ASPIRIN 81 MG PO CHEW: 81.0000 mg | CHEWABLE_TABLET | Freq: Every day | ORAL | Status: DC

## 2012-06-02 MED ORDER — ASPIRIN 81 MG PO TABS: 325.0000 mg | ORAL_TABLET | Freq: Every day | ORAL | Status: DC

## 2012-06-02 MED FILL — Dextrose Inj 5%: INTRAVENOUS | Qty: 50 | Status: AC

## 2012-06-02 NOTE — Progress Notes (Signed)
Patient ID: Adrian Gamble, male   DOB: Nov 14, 1946, 66 y.o.   MRN: [id number]                   [address]            [place] [id number]          [phone]     1 Day Post-Op Procedure(s) (LRB): PERCUTANEOUS CORONARY STENT INTERVENTION (PCI-S) (N/A)  LOS: 1 day   Subjective: No chest pain  Objective: Vital signs in last 24 hours: Patient Vitals for the past 24 hrs:  BP Temp Temp src Pulse Resp SpO2 Height Weight  06/02/12 0430 119/65 mmHg 98 F (36.7 C) Oral 62  16  95 % 5\' 8"  (1.727 m) 222 lb 3.6 oz (100.8 kg)  06/02/12 0000 105/62 mmHg 97.9 F (36.6 C) Oral 63  18  94 % - -  06/01/12 2300 103/65 mmHg - - 64  18  95 % - -  06/01/12 2200 106/63 mmHg - - 66  16  96 % - -  06/01/12 2006 110/70 mmHg 97.8 F (36.6 C) Oral 69  7  95 % - -  06/01/12 1700 132/68 mmHg - - - 9  - - -  06/01/12 1630 124/73 mmHg - - - 16  - - -  06/01/12 1625 116/70 mmHg 98.4 F (36.9 C) Oral 56  - 96 % - -  06/01/12 1600 122/77 mmHg - - - 9  - - -  06/01/12 1525 116/70 mmHg - - - - 96 % - -  06/01/12 1314 - - - 54  - - - -  06/01/12 1300 - - - - - - - 225 lb 1.4 oz (102.1 kg)    Filed Weights   06/01/12 1300 06/02/12 0430  Weight: 225 lb 1.4 oz (102.1 kg) 222 lb 3.6 oz (100.8 kg)    Hemodynamic parameters for last 24 hours:    Intake/Output from previous day: 07/01 0701 - 07/02 0700 In: 465 [P.O.:240; IV Piggyback:225] Out: 250 [Urine:250] Intake/Output this shift:    Scheduled Meds:   . albuterol  2.5 mg Nebulization Once  . aspirin  81 mg Oral Daily  . bivalirudin      . fentaNYL      . heparin      . levothyroxine  125 mcg Oral QHS  . lidocaine      . metoprolol tartrate  25 mg Oral BID  . midazolam      . nitroGLYCERIN      . simvastatin  40 mg Oral QHS  . DISCONTD: adenosine  16 mL Intravenous Once  . DISCONTD: aspirin  325 mg Oral Daily  . DISCONTD: bivalirudin  0.1 mg/kg Intravenous Once  . DISCONTD: levothyroxine  125 mcg Oral QAC breakfast   Continuous  Infusions:   . sodium chloride    . DISCONTD: bivalirudin (ANGIOMAX) infusion 5 mg/mL (Cath Lab,ACS,PCI indication)      PRN Meds:.acetaminophen, nitroGLYCERIN, ondansetron (ZOFRAN) IV  BP 119/65  Pulse 62  Temp 98 F (36.7 C) (Oral)  Resp 16  Ht 5\' 8"  (1.727 m)  Wt 222 lb 3.6 oz (100.8 kg)  BMI 33.79 kg/m2  SpO2 95%  General appearance: alert, cooperative and no distress Neurologic: intact Heart: regular rate and rhythm, S1, S2 normal, no murmur, click, rub or gallop and normal apical impulse Lungs: clear to auscultation bilaterally Abdomen: soft, non-tender; bowel sounds normal; no masses,  no organomegaly Extremities: extremities  normal, atraumatic, no cyanosis or edema  Lab Results: CBC: Basename 06/02/12 0418  WBC 6.5  HGB 15.2  HCT 43.5  PLT 135*   BMET:  Basename 06/02/12 0418  NA 139  K 3.9  CL 104  CO2 24  GLUCOSE 129*  BUN 13  CREATININE 1.01  CALCIUM 8.9    PT/INR: No results found for this basename: LABPROT,INR in the last 72 hours   Radiology No results found.   Assessment/Plan: S/P Procedure(s) (LRB): PERCUTANEOUS CORONARY STENT INTERVENTION (PCI-S) (N/A) preop evaluation continues, lipid panel and Hga1c For CABG Tue July 9  Delight Ovens MD 06/02/2012 10:06 AM

## 2012-06-02 NOTE — Plan of Care (Signed)
Problem: Consults Goal: Cardiac Surgery Patient Education ( See Patient Education module for education specifics.) Outcome: Progressing Going for Heart Surgery booklet given to pt. Will watch videos with wife when she comes in

## 2012-06-02 NOTE — Discharge Summary (Signed)
CARDIOLOGY DISCHARGE SUMMARY   Patient ID: Adrian Gamble MRN: [id number] DOB/AGE: Sep 23, 1946 66 y.o.  Admit date: 06/01/2012 Discharge date: 06/02/2012  Primary Discharge Diagnosis:  Chest pain, 3-v CAD, outpatient CABG Secondary Discharge Diagnosis:  Past Medical History  Diagnosis Date  . CORONARY ARTERY DISEASE 09/01/2007  . GERD 09/01/2007  . HYPERLIPIDEMIA 09/01/2007  . HYPOTHYROIDISM 05/29/2009  . PEPTIC ULCER DISEASE 09/01/2007  . 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 Impaired glucose tolerance   . H/O hiatal hernia     Consults: TCTS Procedures: Left Heart Catheterization, Selective Coronary Angiography, Left ventricular angiogram, Attempted PTCA of the severe stenosis in the moderate sized obtuse marginal branch of the Circumflex, Fractional Flow Reserve of the LAD  Hospital Course: Adrian Gamble is a 66 year old male with a history of CAD. He was having more chest pain and Dr Antoine Poche admitted him from the office for cath.  The cath results are listed below. PCI was attempted but not successful. Because of 3-vessel disease and failed PCI, Adrian Gamble was seen by the surgeons. Dr Tyrone Sage considered him a good candidate for bypass surgery. Per his note: I reviewed the cath films with the patient, with his symptoms and three-vessel coronary artery disease and unsuccessful attempted angioplasty I agree that coronary artery bypass grafting offers him the best treatment for relief of symptoms and preservation of LV function. The risks and options were discussed in detail. The patient has been loaded with Plavix so we will delay surgery until the risk of bleeding has decreased.  Tentatively plan for CABG on Tuesday, July 9.   Dr Antoine Poche discussed the case with Dr Tyrone Sage. It was felt that remaining in the hospital until surgery was not necessary as long as symptoms could be controlled by decreasing activity and appropriate meds. He was on ASA, a statin and a BB. He was pain-free and considered  stable for discharge, to return for bypass surgery next week.  Labs:   Lab Results  Component Value Date   WBC 6.5 06/02/2012   HGB 15.2 06/02/2012   HCT 43.5 06/02/2012   MCV 88.2 06/02/2012   PLT 135* 06/02/2012    Lab 06/02/12 0418  NA 139  K 3.9  CL 104  CO2 24  BUN 13  CREATININE 1.01  CALCIUM 8.9  PROT --  BILITOT --  ALKPHOS --  ALT --  AST --  GLUCOSE 129*   Lipid Panel     Component Value Date/Time   CHOL 158 06/02/2012 0418   TRIG 217* 06/02/2012 0418   HDL 26* 06/02/2012 0418   CHOLHDL 6.1 06/02/2012 0418   VLDL 43* 06/02/2012 0418   LDLCALC 89 06/02/2012 0418   Radiology: No results found.  Cardiac Cath: 06/01/2012 Left main: 20% mid stenosis.  Left Anterior Descending Artery: Large caliber vessel that tapers to a smaller caliber vessel in the mid portion. The proximal vessel has mild luminal irregularities. There is a stable 60% stenosis in the mid vessel with moderate calcification. This appears to be unchanged from last cath and does not appear to flow limiting. The distal vessel has no obstructive disease noted. There are two very small caliber diagonal branches.  Circumflex Artery: Large caliber vessel with 3 marginal branches. The first obtuse marginal branch is a moderate sized vessel that appears to have 80% proximal stenosis extending back to the ostium. The second obtuse marginal branch has diffuse mild plaque. The AV groove Circumflex has a 40% stenosis in the mid segment leading into the  third obtuse marginal branch.  Right Coronary Artery: Large, dominant vessel with patent stent in the proximal segment. There is minimal restenosis in the stent. The mid vessel has a 60-70% moderate stenosis with mild calcification. In multiple different views, this appears to be moderate. The distal vessel has mild luminal irregularities.  Left Ventricular Angiogram: LVEF 55-60%.  Recommendations: He has triple vessel CAD. His culprit vessel is likely the obtuse marginal branch which  could not be revascularized with PCI as noted above secondary to vessel tortuosity, acute takeoff and severity of stenosis. I believe the lesion in the RCA is severe and the lesion in the mid LAD is at least moderately severe as demonstrated by FFR. I think he would benefit from multi-vessel revascularization with CABG. He will need 5 days of Plavix washout. Symptoms are such that he may be able to be discharged home for Plavix washout while awaiting CABG.   EKG: 02-Jun-2012 04:37:18  Normal sinus rhythm Normal ECG Vent. rate 60 BPM PR interval 184 ms QRS duration 74 ms QT/QTc 406/406 ms P-R-T axes 40 53 68  FOLLOW UP PLANS AND APPOINTMENTS No Known Allergies Medication List  As of 06/02/2012  5:36 PM   STOP taking these medications         tadalafil 20 MG tablet         TAKE these medications         aspirin 81 MG tablet   Take 4 tablets (325 mg total) by mouth daily.      levothyroxine 125 MCG tablet   Commonly known as: SYNTHROID, LEVOTHROID   Take 1 tablet (125 mcg total) by mouth daily.      metoprolol tartrate 25 MG tablet   Commonly known as: LOPRESSOR   Take 1 tablet (25 mg total) by mouth 2 (two) times daily.      nitroGLYCERIN 0.4 MG SL tablet   Commonly known as: NITROSTAT   Place 1 tablet (0.4 mg total) under the tongue every 5 (five) minutes as needed for chest pain.      simvastatin 40 MG tablet   Commonly known as: ZOCOR   Take 1 tablet (40 mg total) by mouth at bedtime.             Discharge Orders    Future Appointments: Provider: Department: Dept Phone: Center:   08/11/2012 8:30 AM [facility] [phone] [facility]   08/18/2012 8:45 AM Gordy Savers, MD [facility] [phone] [facility]      Follow-up Information    Follow up with [facility]. (Bypass surgery on July 9th, per Dr Tyrone Sage)    Contact information:   [address] [place] [address]          BRING ALL  MEDICATIONS WITH YOU TO FOLLOW UP APPOINTMENTS  Time spent with patient to include physician time:  41 min Signed: Theodore Demark 06/02/2012, 10:49 AM Co-Sign MD  Patient seen and examined.  Plan as discussed in my rounding note for today and outlined above. [facility]  06/03/2012  6:23 AM

## 2012-06-02 NOTE — Progress Notes (Signed)
   SUBJECTIVE:  No further chest pain.  No SOB   PHYSICAL EXAM Filed Vitals:   06/01/12 2200 06/01/12 2300 06/02/12 0000 06/02/12 0430  BP: 106/63 103/65 105/62 119/65  Pulse: 66 64 63 62  Temp:   97.9 F (36.6 C) 98 F (36.7 C)  TempSrc:   Oral Oral  Resp: 16 18 18 16   Height:    5\' 8"  (1.727 m)  Weight:    222 lb 3.6 oz (100.8 kg)  SpO2: 96% 95% 94% 95%   General:  No distress Lungs:  Clear Heart:  RRR Abdomen:  Positive bowel sounds, no rebound no guarding Extremities:  Right groin without erythema or brusing  LABS: No results found for this basename: CKTOTAL, CKMB, CKMBINDEX, TROPONINI   Results for orders placed during the hospital encounter of 06/01/12 (from the past 24 hour(s))  POCT ACTIVATED CLOTTING TIME     Status: Normal   Collection Time   06/01/12  1:35 PM      Component Value Range   Activated Clotting Time 724    CBC     Status: Abnormal   Collection Time   06/02/12  4:18 AM      Component Value Range   WBC 6.5  4.0 - 10.5 K/uL   RBC 4.93  4.22 - 5.81 MIL/uL   Hemoglobin 15.2  13.0 - 17.0 g/dL   HCT 16.1  09.6 - 04.5 %   MCV 88.2  78.0 - 100.0 fL   MCH 30.8  26.0 - 34.0 pg   MCHC 34.9  30.0 - 36.0 g/dL   RDW 40.9  81.1 - 91.4 %   Platelets 135 (*) 150 - 400 K/uL  BASIC METABOLIC PANEL     Status: Abnormal   Collection Time   06/02/12  4:18 AM      Component Value Range   Sodium 139  135 - 145 mEq/L   Potassium 3.9  3.5 - 5.1 mEq/L   Chloride 104  96 - 112 mEq/L   CO2 24  19 - 32 mEq/L   Glucose, Bld 129 (*) 70 - 99 mg/dL   BUN 13  6 - 23 mg/dL   Creatinine, Ser 7.82  0.50 - 1.35 mg/dL   Calcium 8.9  8.4 - 95.6 mg/dL   GFR calc non Af Amer 75 (*) >90 mL/min   GFR calc Af Amer 87 (*) >90 mL/min    Intake/Output Summary (Last 24 hours) at 06/02/12 0646 Last data filed at 06/01/12 1900  Gross per 24 hour  Intake    465 ml  Output    250 ml  Net    215 ml    ASSESSMENT AND PLAN:  CAD:  I have reviewed the films and I agree with CABG.  This  is planned for Tuesday of next week.  The patient can go home on the current medications.    Hyperlipidemia:  Discharge on current medications.  I don't see a recent lipid and I will order one for this morning.  OK to discharge.     [facility] 06/02/2012 6:46 AM

## 2012-06-02 NOTE — Progress Notes (Signed)
PFT done. Unconfirmed copy placed in progress notes section of shadow chart.  Inocente Salles RRT, RCP 06/02/2012 1:25 PM

## 2012-06-02 NOTE — Progress Notes (Signed)
*  PRELIMINARY RESULTS* Echocardiogram 2D Echocardiogram has been performed.  Adrian Gamble 06/02/2012, 2:15 PM

## 2012-06-02 NOTE — Consult Note (Signed)
[address]            [place] [address]          [phone]       Randie Tallarico [facility] Medical Record #[id number] Date of Birth: Sep 15, 1946  Referring: Dr Antoine Poche  Primary Care: Rogelia Boga, MD  Chief Complaint:  Exercise induced chest pain  History of Present Illness:        Current Activity/ Functional Status: Patient is independent with mobility/ambulation, transfers, ADL's, IADL's.   Past Medical History  Diagnosis Date  . CORONARY ARTERY DISEASE 09/01/2007  . GERD 09/01/2007  . HYPERLIPIDEMIA 09/01/2007  . HYPOTHYROIDISM 05/29/2009  . PEPTIC ULCER DISEASE 09/01/2007  . 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 Impaired glucose tolerance   . H/O hiatal hernia     Past Surgical History  Procedure Date  . Coronary stent placement 2001    RCA  . Tonsillectomy         History  Smoking status  . Current smokes pipe or cigar  daily quit smoking cig 30 years ago  .    Smokeless tobacco  . Never Used  Comment: Distant cigarettes.  Occasional pipe    History  Alcohol Use No    History   Social History  . Marital Status: Married    Spouse Name: N/A    Number of Children: N/A  . Years of Education: N/A   Occupational History  . Retired    Social History Main Topics  . Smoking status: Current Some Day Smoker    Types: Cigarettes  . Smokeless tobacco: Never Used   Comment: Distant cigarettes.  Occasional pipe  . Alcohol Use: No  . Drug Use: No  . Sexually Active: Not on file     No Known Allergies  Current Facility-Administered Medications  Medication Dose Route Frequency Provider Last Rate Last Dose  . 0.9 %  sodium chloride infusion   Intravenous Continuous Kathleene Hazel, MD      . acetaminophen (TYLENOL) tablet 650 mg  650 mg Oral Q4H PRN Kathleene Hazel, MD      . aspirin tablet 325 mg  325 mg Oral Daily Kathleene Hazel, MD      . bivalirudin (ANGIOMAX) 250 MG injection            . fentaNYL (SUBLIMAZE) 0.05 MG/ML injection           . heparin 2-0.9 UNIT/ML-% infusion           . levothyroxine (SYNTHROID, LEVOTHROID) tablet 125 mcg  125 mcg Oral QHS Kathleene Hazel, MD      . lidocaine (XYLOCAINE) 1 % injection           . metoprolol tartrate (LOPRESSOR) tablet 25 mg  25 mg Oral BID Kathleene Hazel, MD   25 mg at 06/01/12 2200  . midazolam (VERSED) 2 MG/2ML injection           . nitroGLYCERIN (NITROSTAT) SL tablet 0.4 mg  0.4 mg Sublingual Q5 min PRN Kathleene Hazel, MD      . nitroGLYCERIN (NTG ON-CALL) 0.2 mg/mL injection           . ondansetron (ZOFRAN) injection 4 mg  4 mg Intravenous Q6H PRN Kathleene Hazel, MD      . simvastatin (ZOCOR) tablet 40 mg  40 mg Oral QHS Cristal Deer  Ellene Route, MD   40 mg at 06/01/12 2145   Facility-Administered Medications Ordered in Other Encounters  Medication Dose Route Frequency Provider Last Rate Last Dose  . aspirin chewable tablet 324 mg  324 mg Oral Pre-Cath Rollene Rotunda, MD   324 mg at 06/01/12 0945  . clopidogrel (PLAVIX) tablet 600 mg  600 mg Oral Once Kathleene Hazel, MD   600 mg at 06/01/12 1153    Prescriptions prior to admission  Medication Sig Dispense Refill  . aspirin 325 MG tablet Take 325 mg by mouth daily.        Marland Kitchen levothyroxine (SYNTHROID, LEVOTHROID) 125 MCG tablet Take 1 tablet (125 mcg total) by mouth daily.  90 tablet  6  . metoprolol tartrate (LOPRESSOR) 25 MG tablet Take 1 tablet (25 mg total) by mouth 2 (two) times daily.  60 tablet  6  . nitroGLYCERIN (NITROSTAT) 0.4 MG SL tablet Place 1 tablet (0.4 mg total) under the tongue every 5 (five) minutes as needed for chest pain.  25 tablet  prn  . simvastatin (ZOCOR) 40 MG tablet Take 1 tablet (40 mg total) by mouth at bedtime.  90 tablet  6  . tadalafil (CIALIS) 20 MG tablet Take 1 tablet (20 mg total) by mouth daily as needed.  10 tablet  6    Family History  Problem Relation Age of Onset  . Memory loss  Father   . Coronary artery disease Father 60    cabg now alive at 106      Review of Systems:     Cardiac Review of Systems: Y or N  Chest Pain [  y  ]  Resting SOB [  n ] Exertional SOB  [ y ]  Pollyann Kennedy Milo.Brash  ]   Pedal Edema [ n  ]    Palpitations [ n ] Syncope  [ n ]   Presyncope [ n  ]  General Review of Systems: [Y] = yes [  ]=no Constitional: recent weight change [ n ]; anorexia [  ]; fatigue Cove.Etienne  ]; nausea [ y ]; night sweats [ n ]; fever [n  ]; or chills [ n ];                                                                                                                                          Dental: poor dentition[n  ];  Eye : blurred vision [  ]; diplopia [   ]; vision changes [  ];  Amaurosis fugax[  ]; Resp: cough [  ];  wheezing[  ];  hemoptysis[  ]; shortness of breath[  ]; paroxysmal nocturnal dyspnea[  ]; dyspnea on exertion[  ]; or orthopnea[  ];  GI:  gallstones[  ], vomiting[  ];  dysphagia[  ]; melena[  ];  hematochezia [  ]; heartburn[  ];   Hx of  Colonoscopy[ more then 10 years ago  ]; GU: kidney stones [  ]; hematuria[  ];   dysuria [  ];  Nocturia[1- 2 times per night  ];  history of     obstruction [  ];             Skin: rash, swelling[  ];, hair loss[  ];  peripheral edema[  ];  or itching[  ]; Musculosketetal: myalgias[  ];  joint swelling[  ];  joint erythema[  ];  joint pain[  ];  back pain[  ];  Heme/Lymph: bruising[  ];  bleeding[ n ];  anemia[  ];  Neuro: TIA[  ];  headaches[  ];  stroke[  ];  vertigo[  ];  seizures[  ];   paresthesias[  ];  difficulty walking[ n ];  Psych:depression[  ]; anxiety[  ];  Endocrine: diabetes[  ];  thyroid dysfunction[  ];  Immunizations: Flu [ yes ]; Pneumococcal[ yes ];  Other:  Physical Exam: BP 119/65  Pulse 62  Temp 98 F (36.7 C) (Oral)  Resp 16  Ht 5\' 8"  (1.727 m)  Wt 222 lb 3.6 oz (100.8 kg)  BMI 33.79 kg/m2  SpO2 95%  General appearance: alert, cooperative, appears older than stated age and no  distress Neurologic: intact Heart: regular rate and rhythm, S1, S2 normal, no murmur, click, rub or gallop and normal apical impulse Lungs: clear to auscultation bilaterally and normal percussion bilaterally Abdomen: soft, non-tender; bowel sounds normal; no masses,  no organomegaly Extremities: extremities normal, atraumatic, no cyanosis or edema and Homans sign is negative, no sign of DVT Wound: rt groin cath site without hematoma No carotid bruit bilateral, full and equal pedal pulses bilaterally No cervical or supraclavicular adenopathy  Diagnostic Studies & Laboratory data:  CATH: Cardiac Catheterization Operative Report  Saintclair Schroader  [id number]  7/1/201311:17 AM  Rogelia Boga, MD  Procedure Performed:  1. Left Heart Catheterization 2. Selective Coronary Angiography 3. Left ventricular angiogram Operator: Verne Carrow, MD  Indication: Unstable angina, known CAD.  Procedure Details:  The risks, benefits, complications, treatment options, and expected outcomes were discussed with the patient. The patient and/or family concurred with the proposed plan, giving informed consent. The patient was brought to the outpatient cath lab after IV hydration was begun and oral premedication was given. The patient was further sedated with Versed and Fentanyl. The right groin was prepped and draped in the usual manner. Using the modified Seldinger access technique, a 4 French sheath was placed in the right femoral artery. A 3DRC catheter was used to engage the RCA. A JL4 catheter was used to engage the left main. Selective coronary angiography was performed. A pigtail catheter was used to perform a left ventricular angiogram.  There were no immediate complications. The patient was taken to the recovery area in stable condition.  Hemodynamic Findings:  Central aortic pressure: 98/59  Left ventricular pressure: 94/9/13  Angiographic Findings:  Left main: 20% mid stenosis.  Left  Anterior Descending Artery: Large caliber vessel that tapers to a smaller caliber vessel in the mid portion. The proximal vessel has mild luminal irregularities. There is a stable 60% stenosis in the mid vessel with moderate calcification. This appears to be unchanged from last cath and does not appear to flow limiting. The distal vessel has no obstructive disease noted. There are two very small caliber diagonal branches.  Circumflex Artery: Large caliber vessel with 3 marginal branches. The first obtuse marginal branch is a moderate  sized vessel that appears to have 80% proximal stenosis extending back to the ostium. The second obtuse marginal branch has diffuse mild plaque. The AV groove Circumflex has a 40% stenosis in the mid segment leading into the third obtuse marginal branch.  Right Coronary Artery: Large, dominant vessel with patent stent in the proximal segment. There is minimal restenosis in the stent. The mid vessel has a 60-70% moderate stenosis with mild calcification. In multiple different views, this appears to be moderate. The distal vessel has mild luminal irregularities.  Left Ventricular Angiogram: LVEF 55-60%.  Impression:  1. Triple vessel CAD with severe stenosis in the obtuse marginal branch of the Circumflex, moderate stenosis in the mid RCA and stable moderate stenosis in the mid LAD  2. Unstable angina which is most likely secondary to the stenosis in the obtuse marginal branch  3. Preserved LV systolic function  Recommendations: Will load with Plavix 600 mg po x 1 now and plan PCI of the OM lesion in the inpatient lab and flow wire (FFR) analysis of the RCA lesion.  Complications: None. The patient tolerated the procedure well.   ATTEMPT PTCA:   Cardiac Catheterization Operative Report  Ciaran Begay  [id number]  7/1/20132:19 PM  Rogelia Boga, MD  Procedure Performed:  1. Attempted PTCA of the severe stenosis in the moderate sized obtuse marginal branch of the  Circumflex  2. Fractional Flow Reserve of the LAD  Operator: Verne Carrow, MD  Indication: Unstable angina, known CAD. Diagnostic cath this am. Pt found to have severe stenosis of the first OM branch, and moderately severe stenosis in the mid LAD and mid RCA.  Procedure Details:  The risks, benefits, complications, treatment options, and expected outcomes were discussed with the patient. The patient and/or family concurred with the proposed plan, giving informed consent. The patient was brought to the inpatient cath lab from the outpatient cath lab. The patient was further sedated with Versed and Fentanyl. There was a 4 [place] sheath present in the right femoral artery. The right groin was prepped and draped in the usual manner. The 4 French sheath was exchanged for a 6 French sheath in the right femoral artery. The patient had been loaded with Plavix prior to the intervention. He was given a bolus of Angiomax and a drip was started. I then engaged the left main with a XB LAD 3.5 guiding catheter. When the ACT was greater than 200, I attempted to pass a Cougar IC wire down the Circumflex into the first OM branch. The first OM branch had severe stenosis extending from the ostium through the proximal vessel. I was unable to pass the wire down the OM branch. I changed out for a Whisper wire and used an over the wire balloon for support but was not able to pass the wire down the vessel secondary to the acute takeoff, proximal location and disease in the ostium of the vessel. I felt that his obtuse marginal lesion is probably the culprit vessel. At this point, I felt that it was important to establish reduction of flow down the LAD secondary to the moderately severe stenosis in the mid vessel. I removed the wires from the Circumflex and passed a pressure wire down the LAD. Baseline Fractional Flow Reserve of 0.90. He was started on an adenosine drip. With adenosine infusion, FFR dropped to 0.82-0.83. This  suggested that the flow down the LAD is inhibited but in the intermediate zone for diagnosis of a severely flow limiting lesion. He did  complain of severe chest pressure and dyspnea with adenosine infusion. The stenosis in the RCA angiographically appears to be moderate severe. I was unable to perform FFR of the RCA secondary to the patient's intolerance to adenosine which was exhibited during infusion for FFR of the LAD.  There were no immediate complications. The patient was taken to the recovery area in stable condition.  Hemodynamic Findings:  Central aortic pressure: 112/68  Impression:  1. Unstable angina with severe stenosis in ostium of first obtuse marginal branch which could not be approached with PCI secondary to factors listed above  2. LAD stenosis and RCA stenosis in the moderately severe to severe range.  Recommendations: He has triple vessel CAD. His culprit vessel is likely the obtuse marginal branch which could not be revascularized with PCI as noted above secondary to vessel tortuosity, acute takeoff and severity of stenosis. I believe the lesion in the RCA is severe and the lesion in the mid LAD is at least moderately severe as demonstrated by FFR. I think he would benefit from multi-vessel revascularization with CABG. He will be admitted tonight. Will d/c Plavix. He will need 5 days of Plavix washout. Symptoms are such that he may be able to be discharged home for Plavix washout while awaiting CABG.  Complications: None; patient tolerated the procedure well.   Recent Radiology Findings:  No results found.    Recent Lab Findings: Lab Results  Component Value Date   WBC 6.5 06/02/2012   HGB 15.2 06/02/2012   HCT 43.5 06/02/2012   PLT 135* 06/02/2012   GLUCOSE 129* 06/02/2012   CHOL 135 07/17/2011   TRIG 93.0 07/17/2011   HDL 36.40* 07/17/2011   LDLCALC 80 07/17/2011   ALT 39 07/17/2011   AST 31 07/17/2011   NA 139 06/02/2012   K 3.9 06/02/2012   CL 104 06/02/2012   CREATININE 1.01  06/02/2012   BUN 13 06/02/2012   CO2 24 06/02/2012   TSH 0.85 07/17/2011   INR 1.0 05/26/2012   No results found for this basename: HGBA1C     Assessment / Plan:      CORONARY ARTERY DISEASE With known previous myocardial infarction 12 years ago , now with recurrent new onset of angina and three-vessel coronary artery disease   GERD  HYPERLIPIDEMIA  HYPOTHYROIDISM  PEPTIC ULCER DISEASE  TESTOSTERONE DEFICIENCY  Impaired glucose tolerance  H/O hiatal hernia   I reviewed the cath films with the patient, with his symptoms and three-vessel coronary artery disease and unsuccessful attempted angioplasty I agree that coronary artery bypass grafting offers him the best treatment for relief of symptoms and preservation of LV function. The risks and options were discussed in detail. The patient has been loaded with Plavix so we will delay surgery until the risk of bleeding has decreased. Tentatively plan for CABG on Tuesday, July 9.     Delight Ovens MD  Beeper [phone] Office [phone] 06/01/2012 6:00pm

## 2012-06-02 NOTE — Discharge Instructions (Signed)
Nothing by mouth the night before the surgery, unless otherwise instructed by the surgeons.  PLEASE REMEMBER TO BRING ALL OF YOUR MEDICATIONS TO EACH OF YOUR FOLLOW-UP OFFICE VISITS.  PLEASE ATTEND ALL SCHEDULED FOLLOW-UP APPOINTMENTS.   Activity: Increase activity slowly as tolerated. You may shower, but no soaking baths (or swimming) for 1 week. No driving for 2 days. No lifting over 5 lbs for 1 week. No sexual activity for 1 week.   You May Return to Work: in 1 week (if applicable)  Wound Care: You may wash cath site gently with soap and water. Keep cath site clean and dry. If you notice pain, swelling, bleeding or pus at your cath site, please call [phone].    Groin Site Care Refer to this sheet in the next few weeks. These instructions provide you with information on caring for yourself after your procedure. Your caregiver may also give you more specific instructions. Your treatment has been planned according to current medical practices, but problems sometimes occur. Call your caregiver if you have any problems or questions after your procedure. HOME CARE INSTRUCTIONS  You may shower 24 hours after the procedure. Remove the bandage (dressing) and gently wash the site with plain soap and water. Gently pat the site dry.   Do not apply powder or lotion to the site.   Do not sit in a bathtub, swimming pool, or whirlpool for 5 to 7 days.   No bending, squatting, or lifting anything over 10 pounds (4.5 kg) as directed by your caregiver.   Inspect the site at least twice daily.   Do not drive home if you are discharged the same day of the procedure. Have someone else drive you.   You may drive 24 hours after the procedure unless otherwise instructed by your caregiver.  What to expect:  Any bruising will usually fade within 1 to 2 weeks.   Blood that collects in the tissue (hematoma) may be painful to the touch. It should usually decrease in size and tenderness within 1 to 2 weeks.    SEEK IMMEDIATE MEDICAL CARE IF:  You have unusual pain at the groin site or down the affected leg.   You have redness, warmth, swelling, or pain at the groin site.   You have drainage (other than a small amount of blood on the dressing).   You have chills.   You have a fever or persistent symptoms for more than 72 hours.   You have a fever and your symptoms suddenly get worse.   Your leg becomes pale, cool, tingly, or numb.   You have heavy bleeding from the site. Hold pressure on the site.  Document Released: 12/21/2010 Document Revised: 11/07/2011 Document Reviewed: 12/21/2010 [facility] Patient Information 2012 [place], [place].

## 2012-06-02 NOTE — Progress Notes (Signed)
VASCULAR LAB PRELIMINARY  PRELIMINARY  PRELIMINARY  PRELIMINARY  Pre-op Cardiac Surgery   Carotid Findings: Bilateral:  No evidence of hemodynamically significant internal carotid artery stenosis.   Vertebral artery flow is antegrade.     Upper Extremity Right Left  Brachial Pressures 121 Triphasic 120 Triphasic  Radial Waveforms Triphasic Triphasic  Ulnar Waveforms Triphasic Triphasic  Palmar Arch (Allen's Test) Normal Normal   Findings:  Doppler waveforms remained normal bilaterally with both radial and ulnar compressions    Lower  Extremity Right Left  Dorsalis Pedis    Anterior Tibial    Posterior Tibial    Ankle/Brachial Indices      Findings:  Palpable pedal pulses bilaterally   Adrian Gamble, RVS 06/02/2012, 1:45 PM

## 2012-06-03 ENCOUNTER — Encounter (HOSPITAL_COMMUNITY): Payer: Self-pay | Admitting: Pharmacy Technician

## 2012-06-03 LAB — HEMOGLOBIN A1C
Hgb A1c MFr Bld: 6.8 % — ABNORMAL HIGH (ref <5.7)
Mean Plasma Glucose: 148 mg/dL — ABNORMAL HIGH (ref <117)

## 2012-06-05 ENCOUNTER — Encounter (HOSPITAL_COMMUNITY): Payer: Self-pay

## 2012-06-05 ENCOUNTER — Encounter (HOSPITAL_COMMUNITY)
Admit: 2012-06-05 | Discharge: 2012-06-05 | Disposition: A | Discharge status: Home / Self Care | Payer: Medicare HMO | Attending: Cardiothoracic Surgery | Admitting: Cardiothoracic Surgery

## 2012-06-05 VITALS — BP 132/78 | HR 64 | Temp 97.3°F | Resp 20 | Ht 68.0 in | Wt 219.1 lb

## 2012-06-05 DIAGNOSIS — I2510 Atherosclerotic heart disease of native coronary artery without angina pectoris: Secondary | ICD-10-CM

## 2012-06-05 HISTORY — DX: Diverticulosis of intestine, part unspecified, without perforation or abscess without bleeding: K57.90

## 2012-06-05 HISTORY — DX: Unspecified cataract: H26.9

## 2012-06-05 HISTORY — DX: Nocturia: R35.1

## 2012-06-05 HISTORY — DX: Cervicalgia: M54.2

## 2012-06-05 LAB — BLOOD GAS, ARTERIAL
Acid-Base Excess: 0.5 mmol/L (ref 0.0–2.0)
Bicarbonate: 24.4 mEq/L — ABNORMAL HIGH (ref 20.0–24.0)
Drawn by: 181601
FIO2: 0.21 %
O2 Saturation: 96.7 %
Patient temperature: 98.6
TCO2: 25.6 mmol/L (ref 0–100)
pCO2 arterial: 38.2 mmHg (ref 35.0–45.0)
pH, Arterial: 7.421 (ref 7.350–7.450)
pO2, Arterial: 88 mmHg (ref 80.0–100.0)

## 2012-06-05 LAB — COMPREHENSIVE METABOLIC PANEL
ALT: 49 U/L (ref 0–53)
AST: 45 U/L — ABNORMAL HIGH (ref 0–37)
Albumin: 3.9 g/dL (ref 3.5–5.2)
Alkaline Phosphatase: 55 U/L (ref 39–117)
BUN: 16 mg/dL (ref 6–23)
CO2: 22 mEq/L (ref 19–32)
Calcium: 10 mg/dL (ref 8.4–10.5)
Chloride: 102 mEq/L (ref 96–112)
Creatinine, Ser: 0.89 mg/dL (ref 0.50–1.35)
GFR calc Af Amer: >90 mL/min (ref >90)
GFR calc non Af Amer: 87 mL/min — ABNORMAL LOW (ref >90)
Glucose, Bld: 171 mg/dL — ABNORMAL HIGH (ref 70–99)
Potassium: 3.9 mEq/L (ref 3.5–5.1)
Sodium: 136 mEq/L (ref 135–145)
Total Bilirubin: 0.6 mg/dL (ref 0.3–1.2)
Total Protein: 7.3 g/dL (ref 6.0–8.3)

## 2012-06-05 LAB — PROTIME-INR
INR: 1.07 (ref 0.00–1.49)
Prothrombin Time: 14.1 seconds (ref 11.6–15.2)

## 2012-06-05 LAB — TYPE AND SCREEN
ABO/RH(D): B POS
Antibody Screen: NEGATIVE

## 2012-06-05 LAB — CBC
HCT: 45.8 % (ref 39.0–52.0)
Hemoglobin: 16.3 g/dL (ref 13.0–17.0)
MCH: 31 pg (ref 26.0–34.0)
MCHC: 35.6 g/dL (ref 30.0–36.0)
MCV: 87.2 fL (ref 78.0–100.0)
Platelets: 146 10*3/uL — ABNORMAL LOW (ref 150–400)
RBC: 5.25 MIL/uL (ref 4.22–5.81)
RDW: 12.5 % (ref 11.5–15.5)
WBC: 5 10*3/uL (ref 4.0–10.5)

## 2012-06-05 LAB — URINE MICROSCOPIC-ADD ON

## 2012-06-05 LAB — ABO/RH: ABO/RH(D): B POS

## 2012-06-05 LAB — URINALYSIS, ROUTINE W REFLEX MICROSCOPIC
Bilirubin Urine: NEGATIVE
Glucose, UA: 250 mg/dL — AB
Ketones, ur: NEGATIVE mg/dL
Leukocytes, UA: NEGATIVE
Nitrite: NEGATIVE
Protein, ur: NEGATIVE mg/dL
Specific Gravity, Urine: 1.016 (ref 1.005–1.030)
Urobilinogen, UA: 1 mg/dL (ref 0.0–1.0)
pH: 6 (ref 5.0–8.0)

## 2012-06-05 LAB — LIPID PANEL
Cholesterol: 202 mg/dL — ABNORMAL HIGH (ref 0–200)
HDL: 32 mg/dL — ABNORMAL LOW (ref >39)
LDL Cholesterol: 135 mg/dL — ABNORMAL HIGH (ref 0–99)
Total CHOL/HDL Ratio: 6.3 RATIO
Triglycerides: 177 mg/dL — ABNORMAL HIGH (ref <150)
VLDL: 35 mg/dL (ref 0–40)

## 2012-06-05 LAB — SURGICAL PCR SCREEN
MRSA, PCR: NEGATIVE
Staphylococcus aureus: POSITIVE — AB

## 2012-06-05 LAB — APTT: aPTT: 29 seconds (ref 24–37)

## 2012-06-05 MED ORDER — CHLORHEXIDINE GLUCONATE 4 % EX LIQD: 30.0000 mL | CUTANEOUS | Status: DC

## 2012-06-05 NOTE — Pre-Procedure Instructions (Signed)
20 Markey Deady  06/05/2012   Your procedure is scheduled on:  Tues, July 9 @ 7:30 AM  Report to [facility] at 5:30 AM.  Call this number if you have problems the morning of surgery: [phone]   Remember:   Do not eat food:After Midnight.  Take these medicines the morning of surgery with A SIP OF WATER: Metoprolol(Lopressor) and Synthroid(Levothyroxine)   Do not wear jewelry  Do not wear lotions, powders, or cologne  Men may shave face and neck.  Do not bring valuables to the hospital.  Contacts, dentures or bridgework may not be worn into surgery.  Leave suitcase in the car. After surgery it may be brought to your room.  For patients admitted to the hospital, checkout time is 11:00 AM the day of discharge.   Patients discharged the day of surgery will not be allowed to drive home.    Special Instructions: Incentive Spirometry - Practice and bring it with you on the day of surgery. and CHG Shower Use Special Wash: 1/2 bottle night before surgery and 1/2 bottle morning of surgery.   Please read over the following fact sheets that you were given: Pain Booklet, Coughing and Deep Breathing, Blood Transfusion Information, Open Heart Packet, MRSA Information and Surgical Site Infection Prevention

## 2012-06-05 NOTE — Progress Notes (Signed)
Requested PFT's from Respiratory

## 2012-06-05 NOTE — Progress Notes (Addendum)
Dr.Hochrein is cardiologist--and started pt on Metoprolol a week ago   Heart cath in epic from 06/01/12 Echo in epic from 06/02/12 Stress test in epic from 2005  Medical MD is at [facility] with Kwalski(Labuaer)  Ekg and cxr in epic from 06/01/12 Carotid dopplers in epic from 06/02/12

## 2012-06-05 NOTE — Consult Note (Signed)
Anesthesia Chart Review:  Patient is a 66 year old male scheduled for CABG on 06/09/12 by Dr. Tyrone Sage.  History includes CAD, MI s/p RCA stent '01, HLD, PUD,  hypothyroidism, cataracts, GERD, hiatal hernia, smoking, low testosterone, OSA, neck pain, obesity with BMI 33, impaired glucose tolerance without formal diagnosis of DM2 (A1C on 06/02/12 was 6.8).  PCP is Dr. Eleonore Chiquito.  Primary Cardiologist is Dr. Antoine Poche.  He was just admitted 7/1-06/02/12 for cardiac cath with unsuccessful attempt at PCI.  TCTS consult was requested for CABG.  EKG on 06/02/12 showed NSR (resolution of Q waves in III, aVF since 06/01/12).    Echo on 06/02/12 showed: - Normal LV size and systolic function, EF 60-65%. Mild LV hypertrophy. Normal RV size and systolic function. Aortic sclerosis without stenosis.  Cardiac cath on 06/01/12 showed: 1. Triple vessel CAD with severe stenosis in the obtuse marginal branch of the Circumflex, moderate stenosis in the mid RCA and stable moderate stenosis in the mid LAD  2. Unstable angina which is most likely secondary to the stenosis in the obtuse marginal branch  3. Preserved LV systolic function, EF 55-60%.  No acute disease on CXR from 06/02/12.  Labs noted.  Plan to proceed.  Shonna Chock, PA-C

## 2012-06-08 MED ORDER — METOPROLOL TARTRATE 12.5 MG HALF TABLET
12.5000 mg | ORAL_TABLET | Freq: Once | ORAL | Status: DC
  Filled 2012-06-08: qty 1

## 2012-06-08 MED ORDER — EPINEPHRINE HCL 1 MG/ML IJ SOLN
0.5000 ug/min | INTRAVENOUS | Status: DC
  Filled 2012-06-08: qty 4

## 2012-06-08 MED ORDER — PHENYLEPHRINE HCL 10 MG/ML IJ SOLN
30.0000 ug/min | INTRAVENOUS | Status: DC
  Filled 2012-06-08: qty 2

## 2012-06-08 MED ORDER — DEXMEDETOMIDINE HCL IN NACL 400 MCG/100ML IV SOLN
0.1000 ug/kg/h | INTRAVENOUS | Status: DC
  Filled 2012-06-08: qty 100

## 2012-06-08 MED ORDER — NITROGLYCERIN IN D5W 200-5 MCG/ML-% IV SOLN
2.0000 ug/min | INTRAVENOUS | Status: DC
  Filled 2012-06-08: qty 250

## 2012-06-08 MED ORDER — SODIUM BICARBONATE 8.4 % IV SOLN
INTRAVENOUS | Status: AC
  Administered 2012-06-09: 09:00:00
  Filled 2012-06-08: qty 2.5

## 2012-06-08 MED ORDER — TRANEXAMIC ACID (OHS) PUMP PRIME SOLUTION
2.0000 mg/kg | INTRAVENOUS | Status: DC
  Filled 2012-06-08: qty 1.99

## 2012-06-08 MED ORDER — POTASSIUM CHLORIDE 2 MEQ/ML IV SOLN
80.0000 meq | INTRAVENOUS | Status: DC
  Filled 2012-06-08: qty 40

## 2012-06-08 MED ORDER — VANCOMYCIN HCL 1000 MG IV SOLR
1500.0000 mg | INTRAVENOUS | Status: AC
  Administered 2012-06-09: 1500 mg via INTRAVENOUS
  Filled 2012-06-08: qty 1500

## 2012-06-08 MED ORDER — DEXTROSE 5 % IV SOLN
1.5000 g | INTRAVENOUS | Status: AC
  Administered 2012-06-09: .75 g via INTRAVENOUS
  Administered 2012-06-09: 1.5 g via INTRAVENOUS
  Filled 2012-06-08: qty 1.5

## 2012-06-08 MED ORDER — MAGNESIUM SULFATE 50 % IJ SOLN
40.0000 meq | INTRAMUSCULAR | Status: DC
  Filled 2012-06-08: qty 10

## 2012-06-08 MED ORDER — SODIUM CHLORIDE 0.9 % IV SOLN
INTRAVENOUS | Status: DC
  Filled 2012-06-08: qty 1

## 2012-06-08 MED ORDER — DOPAMINE-DEXTROSE 3.2-5 MG/ML-% IV SOLN
2.0000 ug/kg/min | INTRAVENOUS | Status: DC
  Filled 2012-06-08: qty 250

## 2012-06-08 MED ORDER — TRANEXAMIC ACID (OHS) BOLUS VIA INFUSION
15.0000 mg/kg | INTRAVENOUS | Status: DC
  Filled 2012-06-08: qty 1490

## 2012-06-08 MED ORDER — TRANEXAMIC ACID 100 MG/ML IV SOLN
1.5000 mg/kg/h | INTRAVENOUS | Status: DC
  Filled 2012-06-08: qty 25

## 2012-06-08 MED ORDER — DEXTROSE 5 % IV SOLN
750.0000 mg | INTRAVENOUS | Status: DC
  Filled 2012-06-08: qty 750

## 2012-06-09 ENCOUNTER — Encounter (HOSPITAL_COMMUNITY): Payer: Self-pay | Admitting: Vascular Surgery

## 2012-06-09 ENCOUNTER — Inpatient Hospital Stay (HOSPITAL_COMMUNITY): Payer: Medicare HMO

## 2012-06-09 ENCOUNTER — Ambulatory Visit (HOSPITAL_COMMUNITY): Payer: Medicare HMO | Admitting: Vascular Surgery

## 2012-06-09 ENCOUNTER — Encounter (HOSPITAL_COMMUNITY): Payer: Self-pay | Admitting: *Deleted

## 2012-06-09 ENCOUNTER — Encounter (HOSPITAL_COMMUNITY)
Admission: RE | Disposition: A | Discharge status: Home / Self Care | Payer: Self-pay | Source: Ambulatory Visit | Attending: Cardiothoracic Surgery

## 2012-06-09 ENCOUNTER — Inpatient Hospital Stay (HOSPITAL_COMMUNITY)
Admission: RE | Admit: 2012-06-09 | Discharge: 2012-06-14 | DRG: 236 | Disposition: A | Discharge status: Home / Self Care | Payer: Medicare HMO | Source: Ambulatory Visit | Attending: Cardiothoracic Surgery | Admitting: Cardiothoracic Surgery

## 2012-06-09 DIAGNOSIS — E119 Type 2 diabetes mellitus without complications: Secondary | ICD-10-CM | POA: Diagnosis present

## 2012-06-09 DIAGNOSIS — I2510 Atherosclerotic heart disease of native coronary artery without angina pectoris: Secondary | ICD-10-CM

## 2012-06-09 DIAGNOSIS — E039 Hypothyroidism, unspecified: Secondary | ICD-10-CM | POA: Diagnosis present

## 2012-06-09 DIAGNOSIS — D696 Thrombocytopenia, unspecified: Secondary | ICD-10-CM | POA: Diagnosis present

## 2012-06-09 DIAGNOSIS — F17200 Nicotine dependence, unspecified, uncomplicated: Secondary | ICD-10-CM | POA: Diagnosis present

## 2012-06-09 DIAGNOSIS — Z7982 Long term (current) use of aspirin: Secondary | ICD-10-CM

## 2012-06-09 DIAGNOSIS — K219 Gastro-esophageal reflux disease without esophagitis: Secondary | ICD-10-CM | POA: Diagnosis present

## 2012-06-09 DIAGNOSIS — Z951 Presence of aortocoronary bypass graft: Secondary | ICD-10-CM

## 2012-06-09 DIAGNOSIS — D62 Acute posthemorrhagic anemia: Secondary | ICD-10-CM | POA: Diagnosis not present

## 2012-06-09 DIAGNOSIS — E291 Testicular hypofunction: Secondary | ICD-10-CM | POA: Diagnosis present

## 2012-06-09 DIAGNOSIS — I25118 Atherosclerotic heart disease of native coronary artery with other forms of angina pectoris: Secondary | ICD-10-CM | POA: Diagnosis present

## 2012-06-09 DIAGNOSIS — E785 Hyperlipidemia, unspecified: Secondary | ICD-10-CM | POA: Diagnosis present

## 2012-06-09 HISTORY — PX: CORONARY ARTERY BYPASS GRAFT: SHX141

## 2012-06-09 LAB — CBC
HCT: 34.7 % — ABNORMAL LOW (ref 39.0–52.0)
HCT: 37.4 % — ABNORMAL LOW (ref 39.0–52.0)
Hemoglobin: 13 g/dL (ref 13.0–17.0)
MCH: 30.2 pg (ref 26.0–34.0)
MCHC: 34.8 g/dL (ref 30.0–36.0)
MCV: 87 fL (ref 78.0–100.0)
MCV: 87 fL (ref 78.0–100.0)
Platelets: 113 10*3/uL — ABNORMAL LOW (ref 150–400)
RBC: 3.99 MIL/uL — ABNORMAL LOW (ref 4.22–5.81)
RBC: 4.3 MIL/uL (ref 4.22–5.81)
RDW: 12.5 % (ref 11.5–15.5)
RDW: 12.6 % (ref 11.5–15.5)
WBC: 9 10*3/uL (ref 4.0–10.5)
WBC: 10.5 10*3/uL (ref 4.0–10.5)

## 2012-06-09 LAB — POCT I-STAT, CHEM 8
BUN: 10 mg/dL (ref 6–23)
Calcium, Ion: 1.11 mmol/L — ABNORMAL LOW (ref 1.13–1.30)
Chloride: 108 mEq/L (ref 96–112)
Creatinine, Ser: 0.8 mg/dL (ref 0.50–1.35)
Glucose, Bld: 137 mg/dL — ABNORMAL HIGH (ref 70–99)
HCT: 36 % — ABNORMAL LOW (ref 39.0–52.0)
Hemoglobin: 12.2 g/dL — ABNORMAL LOW (ref 13.0–17.0)
Potassium: 4.2 mEq/L (ref 3.5–5.1)
Sodium: 143 mEq/L (ref 135–145)
TCO2: 20 mmol/L (ref 0–100)

## 2012-06-09 LAB — POCT I-STAT 3, ART BLOOD GAS (G3+)
Acid-base deficit: 1 mmol/L (ref 0.0–2.0)
Acid-base deficit: 3 mmol/L — ABNORMAL HIGH (ref 0.0–2.0)
Acid-base deficit: 1 mmol/L (ref 0.0–2.0)
Acid-base deficit: 4 mmol/L — ABNORMAL HIGH (ref 0.0–2.0)
Acid-base deficit: 4 mmol/L — ABNORMAL HIGH (ref 0.0–2.0)
Acid-base deficit: 4 mmol/L — ABNORMAL HIGH (ref 0.0–2.0)
Bicarbonate: 24.2 mEq/L — ABNORMAL HIGH (ref 20.0–24.0)
Bicarbonate: 21.2 mEq/L (ref 20.0–24.0)
Bicarbonate: 23.1 mEq/L (ref 20.0–24.0)
Bicarbonate: 23.2 mEq/L (ref 20.0–24.0)
Bicarbonate: 21.7 mEq/L (ref 20.0–24.0)
Bicarbonate: 22 mEq/L (ref 20.0–24.0)
O2 Saturation: 100 %
O2 Saturation: 100 %
O2 Saturation: 95 %
O2 Saturation: 93 %
O2 Saturation: 99 %
O2 Saturation: 97 %
Patient temperature: 35.4
Patient temperature: 37.5
Patient temperature: 38
Patient temperature: 38
TCO2: 25 mmol/L (ref 0–100)
TCO2: 22 mmol/L (ref 0–100)
TCO2: 24 mmol/L (ref 0–100)
TCO2: 25 mmol/L (ref 0–100)
TCO2: 23 mmol/L (ref 0–100)
TCO2: 23 mmol/L (ref 0–100)
pCO2 arterial: 39.7 mmHg (ref 35.0–45.0)
pCO2 arterial: 32.8 mmHg — ABNORMAL LOW (ref 35.0–45.0)
pCO2 arterial: 33.4 mmHg — ABNORMAL LOW (ref 35.0–45.0)
pCO2 arterial: 51.4 mmHg — ABNORMAL HIGH (ref 35.0–45.0)
pCO2 arterial: 41.3 mmHg (ref 35.0–45.0)
pCO2 arterial: 43.6 mmHg (ref 35.0–45.0)
pH, Arterial: 7.393 (ref 7.350–7.450)
pH, Arterial: 7.418 (ref 7.350–7.450)
pH, Arterial: 7.442 (ref 7.350–7.450)
pH, Arterial: 7.265 — ABNORMAL LOW (ref 7.350–7.450)
pH, Arterial: 7.333 — ABNORMAL LOW (ref 7.350–7.450)
pH, Arterial: 7.315 — ABNORMAL LOW (ref 7.350–7.450)
pO2, Arterial: 311 mmHg — ABNORMAL HIGH (ref 80.0–100.0)
pO2, Arterial: 167 mmHg — ABNORMAL HIGH (ref 80.0–100.0)
pO2, Arterial: 66 mmHg — ABNORMAL LOW (ref 80.0–100.0)
pO2, Arterial: 80 mmHg (ref 80.0–100.0)
pO2, Arterial: 128 mmHg — ABNORMAL HIGH (ref 80.0–100.0)
pO2, Arterial: 100 mmHg (ref 80.0–100.0)

## 2012-06-09 LAB — HEMOGLOBIN AND HEMATOCRIT, BLOOD
HCT: 32.8 % — ABNORMAL LOW (ref 39.0–52.0)
Hemoglobin: 11.4 g/dL — ABNORMAL LOW (ref 13.0–17.0)

## 2012-06-09 LAB — POCT I-STAT 4, (NA,K, GLUC, HGB,HCT)
Glucose, Bld: 127 mg/dL — ABNORMAL HIGH (ref 70–99)
Glucose, Bld: 115 mg/dL — ABNORMAL HIGH (ref 70–99)
Glucose, Bld: 135 mg/dL — ABNORMAL HIGH (ref 70–99)
Glucose, Bld: 127 mg/dL — ABNORMAL HIGH (ref 70–99)
Glucose, Bld: 129 mg/dL — ABNORMAL HIGH (ref 70–99)
Glucose, Bld: 111 mg/dL — ABNORMAL HIGH (ref 70–99)
HCT: 43 % (ref 39.0–52.0)
HCT: 30 % — ABNORMAL LOW (ref 39.0–52.0)
HCT: 39 % (ref 39.0–52.0)
HCT: 32 % — ABNORMAL LOW (ref 39.0–52.0)
HCT: 30 % — ABNORMAL LOW (ref 39.0–52.0)
HCT: 35 % — ABNORMAL LOW (ref 39.0–52.0)
Hemoglobin: 14.6 g/dL (ref 13.0–17.0)
Hemoglobin: 10.2 g/dL — ABNORMAL LOW (ref 13.0–17.0)
Hemoglobin: 13.3 g/dL (ref 13.0–17.0)
Hemoglobin: 10.9 g/dL — ABNORMAL LOW (ref 13.0–17.0)
Hemoglobin: 10.2 g/dL — ABNORMAL LOW (ref 13.0–17.0)
Hemoglobin: 11.9 g/dL — ABNORMAL LOW (ref 13.0–17.0)
Potassium: 4.1 mEq/L (ref 3.5–5.1)
Potassium: 4.1 mEq/L (ref 3.5–5.1)
Potassium: 4.3 mEq/L (ref 3.5–5.1)
Potassium: 4.6 mEq/L (ref 3.5–5.1)
Potassium: 3.4 mEq/L — ABNORMAL LOW (ref 3.5–5.1)
Potassium: 3.4 mEq/L — ABNORMAL LOW (ref 3.5–5.1)
Sodium: 140 mEq/L (ref 135–145)
Sodium: 133 mEq/L — ABNORMAL LOW (ref 135–145)
Sodium: 139 mEq/L (ref 135–145)
Sodium: 137 mEq/L (ref 135–145)
Sodium: 142 mEq/L (ref 135–145)
Sodium: 144 mEq/L (ref 135–145)

## 2012-06-09 LAB — CREATININE, SERUM
Creatinine, Ser: 0.9 mg/dL (ref 0.50–1.35)
GFR calc Af Amer: >90 mL/min (ref >90)
GFR calc non Af Amer: 86 mL/min — ABNORMAL LOW (ref >90)

## 2012-06-09 LAB — PROTIME-INR: INR: 1.47 (ref 0.00–1.49)

## 2012-06-09 LAB — APTT: aPTT: 37 seconds (ref 24–37)

## 2012-06-09 LAB — PLATELET COUNT: Platelets: 146 10*3/uL — ABNORMAL LOW (ref 150–400)

## 2012-06-09 LAB — MAGNESIUM: Magnesium: 2.6 mg/dL — ABNORMAL HIGH (ref 1.5–2.5)

## 2012-06-09 SURGERY — CORONARY ARTERY BYPASS GRAFTING (CABG)
Anesthesia: General | Site: Chest | Wound class: Clean

## 2012-06-09 MED ORDER — PANTOPRAZOLE SODIUM 40 MG PO TBEC: 40.0000 mg | DELAYED_RELEASE_TABLET | Freq: Every day | ORAL | Status: DC

## 2012-06-09 MED ORDER — SODIUM CHLORIDE 0.45 % IV SOLN: INTRAVENOUS | Status: DC

## 2012-06-09 MED ORDER — DOCUSATE SODIUM 100 MG PO CAPS
200.0000 mg | ORAL_CAPSULE | Freq: Every day | ORAL | Status: DC
  Administered 2012-06-10 – 2012-06-11 (×2): 200 mg via ORAL
  Filled 2012-06-09 (×2): qty 2

## 2012-06-09 MED ORDER — LEVOTHYROXINE SODIUM 125 MCG PO TABS
125.0000 ug | ORAL_TABLET | Freq: Every day | ORAL | Status: DC
  Administered 2012-06-10 – 2012-06-14 (×5): 125 ug via ORAL
  Filled 2012-06-09 (×7): qty 1

## 2012-06-09 MED ORDER — ALBUMIN HUMAN 5 % IV SOLN
250.0000 mL | INTRAVENOUS | Status: AC | PRN
  Administered 2012-06-09 (×2): 250 mL via INTRAVENOUS
  Filled 2012-06-09: qty 250

## 2012-06-09 MED ORDER — ROCURONIUM BROMIDE 100 MG/10ML IV SOLN
INTRAVENOUS | Status: DC | PRN
  Administered 2012-06-09 (×2): 50 mg via INTRAVENOUS
  Administered 2012-06-09: 100 mg via INTRAVENOUS

## 2012-06-09 MED ORDER — SODIUM CHLORIDE 0.9 % IJ SOLN: 3.0000 mL | INTRAMUSCULAR | Status: DC | PRN

## 2012-06-09 MED ORDER — METOPROLOL TARTRATE 25 MG/10 ML ORAL SUSPENSION
12.5000 mg | Freq: Two times a day (BID) | ORAL | Status: DC
  Filled 2012-06-09 (×5): qty 5

## 2012-06-09 MED ORDER — BISACODYL 5 MG PO TBEC
10.0000 mg | DELAYED_RELEASE_TABLET | Freq: Every day | ORAL | Status: DC
  Administered 2012-06-10 – 2012-06-11 (×2): 10 mg via ORAL
  Filled 2012-06-09 (×2): qty 2

## 2012-06-09 MED ORDER — NITROGLYCERIN IN D5W 200-5 MCG/ML-% IV SOLN
INTRAVENOUS | Status: DC | PRN
  Administered 2012-06-09: 16.6 ug/min via INTRAVENOUS

## 2012-06-09 MED ORDER — PHENYLEPHRINE HCL 10 MG/ML IJ SOLN
20.0000 mg | INTRAVENOUS | Status: DC | PRN
  Administered 2012-06-09 (×2): 50 ug/min via INTRAVENOUS

## 2012-06-09 MED ORDER — SODIUM CHLORIDE 0.9 % IV SOLN
200.0000 ug | INTRAVENOUS | Status: DC | PRN
  Administered 2012-06-09: 0.2 ug/kg/h via INTRAVENOUS

## 2012-06-09 MED ORDER — DEXTROSE 5 % IV SOLN
0.0000 ug/min | INTRAVENOUS | Status: DC
  Administered 2012-06-10: 40 ug/min via INTRAVENOUS
  Filled 2012-06-09 (×3): qty 2

## 2012-06-09 MED ORDER — HEPARIN SODIUM (PORCINE) 1000 UNIT/ML IJ SOLN
INTRAMUSCULAR | Status: AC
  Filled 2012-06-09: qty 1

## 2012-06-09 MED ORDER — HEMOSTATIC AGENTS (NO CHARGE) OPTIME
TOPICAL | Status: DC | PRN
  Administered 2012-06-09: 2 via TOPICAL

## 2012-06-09 MED ORDER — SODIUM CHLORIDE 0.9 % IV SOLN
INTRAVENOUS | Status: DC
  Filled 2012-06-09 (×2): qty 1

## 2012-06-09 MED ORDER — SODIUM CHLORIDE 0.9 % IV SOLN
100.0000 [IU] | INTRAVENOUS | Status: DC | PRN
  Administered 2012-06-09: 2 [IU]/h via INTRAVENOUS

## 2012-06-09 MED ORDER — ACETAMINOPHEN 650 MG RE SUPP
650.0000 mg | RECTAL | Status: AC
  Administered 2012-06-09: 650 mg via RECTAL

## 2012-06-09 MED ORDER — DEXTROSE 5 % IV SOLN
1.5000 g | Freq: Two times a day (BID) | INTRAVENOUS | Status: DC
  Administered 2012-06-09 – 2012-06-11 (×4): 1.5 g via INTRAVENOUS
  Filled 2012-06-09 (×4): qty 1.5

## 2012-06-09 MED ORDER — INSULIN REGULAR BOLUS VIA INFUSION
0.0000 [IU] | Freq: Three times a day (TID) | INTRAVENOUS | Status: DC
  Filled 2012-06-09: qty 10

## 2012-06-09 MED ORDER — SODIUM CHLORIDE 0.9 % IJ SOLN
3.0000 mL | Freq: Two times a day (BID) | INTRAMUSCULAR | Status: DC
  Administered 2012-06-10 (×2): 3 mL via INTRAVENOUS

## 2012-06-09 MED ORDER — 0.9 % SODIUM CHLORIDE (POUR BTL) OPTIME
TOPICAL | Status: DC | PRN
  Administered 2012-06-09: 6000 mL

## 2012-06-09 MED ORDER — MIDAZOLAM HCL 5 MG/5ML IJ SOLN
INTRAMUSCULAR | Status: DC | PRN
  Administered 2012-06-09: 2 mg via INTRAVENOUS
  Administered 2012-06-09: 5 mg via INTRAVENOUS
  Administered 2012-06-09: 3 mg via INTRAVENOUS

## 2012-06-09 MED ORDER — OXYCODONE HCL 5 MG PO TABS
5.0000 mg | ORAL_TABLET | ORAL | Status: DC | PRN
  Administered 2012-06-10 – 2012-06-11 (×4): 10 mg via ORAL
  Filled 2012-06-09 (×5): qty 2

## 2012-06-09 MED ORDER — SUFENTANIL CITRATE 50 MCG/ML IV SOLN
INTRAVENOUS | Status: DC | PRN
  Administered 2012-06-09: 30 ug via INTRAVENOUS
  Administered 2012-06-09: 40 ug via INTRAVENOUS
  Administered 2012-06-09: 30 ug via INTRAVENOUS
  Administered 2012-06-09: 70 ug via INTRAVENOUS
  Administered 2012-06-09: 10 ug via INTRAVENOUS

## 2012-06-09 MED ORDER — MORPHINE SULFATE 2 MG/ML IJ SOLN
1.0000 mg | INTRAMUSCULAR | Status: AC | PRN
  Administered 2012-06-09: 2 mg via INTRAVENOUS
  Filled 2012-06-09: qty 1

## 2012-06-09 MED ORDER — SIMVASTATIN 40 MG PO TABS
40.0000 mg | ORAL_TABLET | Freq: Every day | ORAL | Status: DC
  Administered 2012-06-10 – 2012-06-13 (×4): 40 mg via ORAL
  Filled 2012-06-09 (×6): qty 1

## 2012-06-09 MED ORDER — MUPIROCIN 2 % EX OINT
1.0000 "application " | TOPICAL_OINTMENT | Freq: Two times a day (BID) | CUTANEOUS | Status: AC
  Administered 2012-06-09 – 2012-06-14 (×10): 1 via NASAL
  Filled 2012-06-09: qty 22

## 2012-06-09 MED ORDER — ONDANSETRON HCL 4 MG/2ML IJ SOLN: 4.0000 mg | Freq: Four times a day (QID) | INTRAMUSCULAR | Status: DC | PRN

## 2012-06-09 MED ORDER — ALBUMIN HUMAN 5 % IV SOLN
INTRAVENOUS | Status: AC
  Filled 2012-06-09: qty 250

## 2012-06-09 MED ORDER — MIDAZOLAM HCL 2 MG/2ML IJ SOLN: 2.0000 mg | INTRAMUSCULAR | Status: DC | PRN

## 2012-06-09 MED ORDER — SODIUM CHLORIDE 0.9 % IV SOLN
INTRAVENOUS | Status: DC
  Administered 2012-06-09: 20 mL via INTRAVENOUS

## 2012-06-09 MED ORDER — ACETAMINOPHEN 160 MG/5ML PO SOLN: 650.0000 mg | ORAL | Status: AC

## 2012-06-09 MED ORDER — PROTAMINE SULFATE 10 MG/ML IV SOLN
INTRAVENOUS | Status: DC | PRN
  Administered 2012-06-09: 250 mg via INTRAVENOUS

## 2012-06-09 MED ORDER — POTASSIUM CHLORIDE 10 MEQ/50ML IV SOLN
10.0000 meq | INTRAVENOUS | Status: AC
  Administered 2012-06-09 (×3): 10 meq via INTRAVENOUS

## 2012-06-09 MED ORDER — METOPROLOL TARTRATE 1 MG/ML IV SOLN: 2.5000 mg | INTRAVENOUS | Status: DC | PRN

## 2012-06-09 MED ORDER — FAMOTIDINE IN NACL 20-0.9 MG/50ML-% IV SOLN
20.0000 mg | Freq: Two times a day (BID) | INTRAVENOUS | Status: DC
  Administered 2012-06-09: 20 mg via INTRAVENOUS

## 2012-06-09 MED ORDER — LACTATED RINGERS IV SOLN
INTRAVENOUS | Status: DC
  Administered 2012-06-09: 60 mL via INTRAVENOUS

## 2012-06-09 MED ORDER — LIDOCAINE HCL (CARDIAC) 20 MG/ML IV SOLN
INTRAVENOUS | Status: DC | PRN
  Administered 2012-06-09: 100 mg via INTRAVENOUS

## 2012-06-09 MED ORDER — ACETAMINOPHEN 500 MG PO TABS
1000.0000 mg | ORAL_TABLET | Freq: Four times a day (QID) | ORAL | Status: DC
  Administered 2012-06-10 – 2012-06-11 (×5): 1000 mg via ORAL
  Filled 2012-06-09 (×8): qty 2

## 2012-06-09 MED ORDER — NITROGLYCERIN IN D5W 200-5 MCG/ML-% IV SOLN: 0.0000 ug/min | INTRAVENOUS | Status: DC

## 2012-06-09 MED ORDER — VANCOMYCIN HCL IN DEXTROSE 1-5 GM/200ML-% IV SOLN
1000.0000 mg | Freq: Once | INTRAVENOUS | Status: AC
  Administered 2012-06-09: 1000 mg via INTRAVENOUS
  Filled 2012-06-09: qty 200

## 2012-06-09 MED ORDER — HEPARIN SODIUM (PORCINE) 1000 UNIT/ML IJ SOLN
INTRAMUSCULAR | Status: DC | PRN
  Administered 2012-06-09: 25000 [IU] via INTRAVENOUS

## 2012-06-09 MED ORDER — SODIUM CHLORIDE 0.9 % IV SOLN: 250.0000 mL | INTRAVENOUS | Status: DC

## 2012-06-09 MED ORDER — EPHEDRINE SULFATE 50 MG/ML IJ SOLN
INTRAMUSCULAR | Status: DC | PRN
  Administered 2012-06-09 (×2): 5 mg via INTRAVENOUS

## 2012-06-09 MED ORDER — DEXMEDETOMIDINE HCL IN NACL 200 MCG/50ML IV SOLN
0.1000 ug/kg/h | INTRAVENOUS | Status: DC
  Administered 2012-06-09: 0.7 ug/kg/h via INTRAVENOUS
  Filled 2012-06-09 (×3): qty 50

## 2012-06-09 MED ORDER — CHLORHEXIDINE GLUCONATE CLOTH 2 % EX PADS
6.0000 | MEDICATED_PAD | Freq: Every day | CUTANEOUS | Status: DC
  Administered 2012-06-09 – 2012-06-11 (×3): 6 via TOPICAL

## 2012-06-09 MED ORDER — ASPIRIN EC 325 MG PO TBEC
325.0000 mg | DELAYED_RELEASE_TABLET | Freq: Every day | ORAL | Status: DC
  Administered 2012-06-10 – 2012-06-11 (×2): 325 mg via ORAL
  Filled 2012-06-09 (×2): qty 1

## 2012-06-09 MED ORDER — SODIUM CHLORIDE 0.9 % IV SOLN
INTRAVENOUS | Status: DC | PRN
  Administered 2012-06-09: 12:00:00 via INTRAVENOUS

## 2012-06-09 MED ORDER — LACTATED RINGERS IV SOLN: 500.0000 mL | Freq: Once | INTRAVENOUS | Status: AC | PRN

## 2012-06-09 MED ORDER — ASPIRIN 81 MG PO CHEW: 324.0000 mg | CHEWABLE_TABLET | Freq: Every day | ORAL | Status: DC

## 2012-06-09 MED ORDER — LACTATED RINGERS IV SOLN
INTRAVENOUS | Status: DC | PRN
  Administered 2012-06-09 (×3): via INTRAVENOUS

## 2012-06-09 MED ORDER — HEMOSTATIC AGENTS (NO CHARGE) OPTIME
TOPICAL | Status: DC | PRN
  Administered 2012-06-09: 1 via TOPICAL

## 2012-06-09 MED ORDER — MAGNESIUM SULFATE 40 MG/ML IJ SOLN
4.0000 g | Freq: Once | INTRAMUSCULAR | Status: AC
  Administered 2012-06-09: 4 g via INTRAVENOUS
  Filled 2012-06-09: qty 100

## 2012-06-09 MED ORDER — TRANEXAMIC ACID 100 MG/ML IV SOLN
2500.0000 mg | INTRAVENOUS | Status: DC | PRN
  Administered 2012-06-09: 15 mg/kg/h via INTRAVENOUS

## 2012-06-09 MED ORDER — METOPROLOL TARTRATE 12.5 MG HALF TABLET
12.5000 mg | ORAL_TABLET | Freq: Two times a day (BID) | ORAL | Status: DC
  Administered 2012-06-11: 12.5 mg via ORAL
  Filled 2012-06-09 (×5): qty 1

## 2012-06-09 MED ORDER — PROPOFOL 10 MG/ML IV EMUL
INTRAVENOUS | Status: DC | PRN
  Administered 2012-06-09: 60 mg via INTRAVENOUS

## 2012-06-09 MED ORDER — MORPHINE SULFATE 2 MG/ML IJ SOLN
2.0000 mg | INTRAMUSCULAR | Status: DC | PRN
  Administered 2012-06-09 – 2012-06-10 (×5): 2 mg via INTRAVENOUS
  Administered 2012-06-10 (×2): 4 mg via INTRAVENOUS
  Filled 2012-06-09 (×4): qty 1
  Filled 2012-06-09: qty 2
  Filled 2012-06-09: qty 1
  Filled 2012-06-09: qty 2

## 2012-06-09 MED ORDER — ACETAMINOPHEN 160 MG/5ML PO SOLN
975.0000 mg | Freq: Four times a day (QID) | ORAL | Status: DC
  Administered 2012-06-09 – 2012-06-10 (×2): 975 mg
  Filled 2012-06-09 (×2): qty 20.3

## 2012-06-09 MED ORDER — LIDOCAINE HCL (CARDIAC) 20 MG/ML IV SOLN
INTRAVENOUS | Status: AC
  Filled 2012-06-09: qty 5

## 2012-06-09 MED ORDER — SODIUM BICARBONATE 8.4 % IV SOLN
INTRAVENOUS | Status: AC
  Filled 2012-06-09: qty 50

## 2012-06-09 MED ORDER — ALBUMIN HUMAN 5 % IV SOLN
INTRAVENOUS | Status: DC | PRN
  Administered 2012-06-09: 12:00:00 via INTRAVENOUS

## 2012-06-09 MED ORDER — BISACODYL 10 MG RE SUPP: 10.0000 mg | Freq: Every day | RECTAL | Status: DC

## 2012-06-09 SURGICAL SUPPLY — 117 items
ATTRACTOMAT 16X20 MAGNETIC DRP (DRAPES) ×2 IMPLANT
BAG DECANTER FOR FLEXI CONT (MISCELLANEOUS) ×2 IMPLANT
BANDAGE ELASTIC 4 VELCRO ST LF (GAUZE/BANDAGES/DRESSINGS) ×2 IMPLANT
BANDAGE ELASTIC 6 VELCRO ST LF (GAUZE/BANDAGES/DRESSINGS) ×2 IMPLANT
BANDAGE GAUZE ELAST BULKY 4 IN (GAUZE/BANDAGES/DRESSINGS) ×2 IMPLANT
BENZOIN TINCTURE PRP APPL 2/3 (GAUZE/BANDAGES/DRESSINGS) ×2 IMPLANT
BLADE STERNUM SYSTEM 6 (BLADE) ×2 IMPLANT
BLADE SURG 11 STRL SS (BLADE) ×2 IMPLANT
BLADE SURG ROTATE 9660 (MISCELLANEOUS) ×2 IMPLANT
CANISTER SUCTION 2500CC (MISCELLANEOUS) ×2 IMPLANT
CANN PRFSN .5XCNCT 15X34-48 (MISCELLANEOUS) ×1
CANNULA AORTIC HI-FLOW 6.5M20F (CANNULA) ×2 IMPLANT
CANNULA PRFSN .5XCNCT 15X34-48 (MISCELLANEOUS) ×1 IMPLANT
CANNULA VEN 2 STAGE (MISCELLANEOUS) ×1
CATH CPB KIT GERHARDT (MISCELLANEOUS) ×2 IMPLANT
CATH THORACIC 28FR (CATHETERS) ×2 IMPLANT
CATH THORACIC 36FR (CATHETERS) IMPLANT
CATH THORACIC 36FR RT ANG (CATHETERS) IMPLANT
CLIP TI MEDIUM 24 (CLIP) IMPLANT
CLIP TI WIDE RED SMALL 24 (CLIP) IMPLANT
CLOTH BEACON ORANGE TIMEOUT ST (SAFETY) ×2 IMPLANT
COVER SURGICAL LIGHT HANDLE (MISCELLANEOUS) ×4 IMPLANT
CRADLE DONUT ADULT HEAD (MISCELLANEOUS) ×2 IMPLANT
DRAIN CHANNEL 28F RND 3/8 FF (WOUND CARE) ×2 IMPLANT
DRAIN CHANNEL 32F RND 10.7 FF (WOUND CARE) IMPLANT
DRAPE CARDIOVASCULAR INCISE (DRAPES) ×1
DRAPE SLUSH MACHINE 52X66 (DRAPES) IMPLANT
DRAPE SLUSH/WARMER DISC (DRAPES) IMPLANT
DRAPE SRG 135X102X78XABS (DRAPES) ×1 IMPLANT
DRSG COVADERM 4X14 (GAUZE/BANDAGES/DRESSINGS) ×2 IMPLANT
ELECT BLADE 4.0 EZ CLEAN MEGAD (MISCELLANEOUS) ×2
ELECT CAUTERY BLADE 6.4 (BLADE) ×2 IMPLANT
ELECT REM PT RETURN 9FT ADLT (ELECTROSURGICAL) ×4
ELECTRODE BLDE 4.0 EZ CLN MEGD (MISCELLANEOUS) ×1 IMPLANT
ELECTRODE REM PT RTRN 9FT ADLT (ELECTROSURGICAL) ×2 IMPLANT
GLOVE BIO SURGEON STRL SZ 6 (GLOVE) ×8 IMPLANT
GLOVE BIO SURGEON STRL SZ 6.5 (GLOVE) ×6 IMPLANT
GLOVE BIO SURGEON STRL SZ7 (GLOVE) IMPLANT
GLOVE BIO SURGEON STRL SZ7.5 (GLOVE) ×4 IMPLANT
GLOVE BIOGEL PI IND STRL 6 (GLOVE) ×3 IMPLANT
GLOVE BIOGEL PI IND STRL 6.5 (GLOVE) IMPLANT
GLOVE BIOGEL PI IND STRL 7.0 (GLOVE) IMPLANT
GLOVE BIOGEL PI INDICATOR 6 (GLOVE) ×3
GLOVE BIOGEL PI INDICATOR 6.5 (GLOVE)
GLOVE BIOGEL PI INDICATOR 7.0 (GLOVE)
GLOVE EUDERMIC 7 POWDERFREE (GLOVE) IMPLANT
GLOVE ORTHO TXT STRL SZ7.5 (GLOVE) IMPLANT
GOWN STRL NON-REIN LRG LVL3 (GOWN DISPOSABLE) ×16 IMPLANT
HEMOSTAT POWDER SURGIFOAM 1G (HEMOSTASIS) ×6 IMPLANT
HEMOSTAT SURGICEL 2X14 (HEMOSTASIS) ×2 IMPLANT
INSERT FOGARTY 61MM (MISCELLANEOUS) IMPLANT
INSERT FOGARTY XLG (MISCELLANEOUS) IMPLANT
KIT BASIN OR (CUSTOM PROCEDURE TRAY) ×2 IMPLANT
KIT ROOM TURNOVER OR (KITS) ×2 IMPLANT
KIT SUCTION CATH 14FR (SUCTIONS) ×4 IMPLANT
KIT VASOVIEW W/TROCAR VH 2000 (KITS) ×2 IMPLANT
LEAD PACING MYOCARDI (MISCELLANEOUS) ×2 IMPLANT
MARKER GRAFT CORONARY BYPASS (MISCELLANEOUS) ×6 IMPLANT
NS IRRIG 1000ML POUR BTL (IV SOLUTION) ×12 IMPLANT
PACK OPEN HEART (CUSTOM PROCEDURE TRAY) ×2 IMPLANT
PAD ARMBOARD 7.5X6 YLW CONV (MISCELLANEOUS) ×4 IMPLANT
PENCIL BUTTON HOLSTER BLD 10FT (ELECTRODE) ×2 IMPLANT
PUNCH AORTIC ROTATE 4.0MM (MISCELLANEOUS) IMPLANT
PUNCH AORTIC ROTATE 4.5MM 8IN (MISCELLANEOUS) ×2 IMPLANT
PUNCH AORTIC ROTATE 5MM 8IN (MISCELLANEOUS) IMPLANT
SENSOR MYOCARDIAL TEMP (MISCELLANEOUS) ×2 IMPLANT
SET CARDIOPLEGIA MPS 5001102 (MISCELLANEOUS) ×2 IMPLANT
SOLUTION ANTI FOG 6CC (MISCELLANEOUS) IMPLANT
SPONGE GAUZE 4X4 12PLY (GAUZE/BANDAGES/DRESSINGS) ×4 IMPLANT
SPONGE LAP 18X18 X RAY DECT (DISPOSABLE) ×6 IMPLANT
SPONGE LAP 4X18 X RAY DECT (DISPOSABLE) IMPLANT
STRIP CLOSURE SKIN 1/2X4 (GAUZE/BANDAGES/DRESSINGS) ×2 IMPLANT
SUT BONE WAX W31G (SUTURE) ×2 IMPLANT
SUT MNCRL AB 4-0 PS2 18 (SUTURE) ×2 IMPLANT
SUT PROLENE 3 0 SH DA (SUTURE) IMPLANT
SUT PROLENE 3 0 SH1 36 (SUTURE) ×2 IMPLANT
SUT PROLENE 4 0 RB 1 (SUTURE)
SUT PROLENE 4 0 SH DA (SUTURE) IMPLANT
SUT PROLENE 4 0 TF (SUTURE) ×4 IMPLANT
SUT PROLENE 4-0 RB1 .5 CRCL 36 (SUTURE) IMPLANT
SUT PROLENE 5 0 C 1 36 (SUTURE) IMPLANT
SUT PROLENE 6 0 C 1 30 (SUTURE) ×2 IMPLANT
SUT PROLENE 6 0 CC (SUTURE) ×10 IMPLANT
SUT PROLENE 7 0 BV 1 (SUTURE) IMPLANT
SUT PROLENE 7 0 BV1 MDA (SUTURE) ×6 IMPLANT
SUT PROLENE 7.0 RB 3 (SUTURE) IMPLANT
SUT PROLENE 8 0 BV175 6 (SUTURE) IMPLANT
SUT SILK  1 MH (SUTURE)
SUT SILK 1 MH (SUTURE) IMPLANT
SUT SILK 2 0 SH CR/8 (SUTURE) IMPLANT
SUT SILK 3 0 SH CR/8 (SUTURE) IMPLANT
SUT STEEL 6MS V (SUTURE) ×2 IMPLANT
SUT STEEL STERNAL CCS#1 18IN (SUTURE) IMPLANT
SUT STEEL SZ 6 DBL 3X14 BALL (SUTURE) ×2 IMPLANT
SUT VIC AB 1 CTX 18 (SUTURE) ×4 IMPLANT
SUT VIC AB 1 CTX 36 (SUTURE)
SUT VIC AB 1 CTX36XBRD ANBCTR (SUTURE) IMPLANT
SUT VIC AB 2-0 CT1 27 (SUTURE)
SUT VIC AB 2-0 CT1 36 (SUTURE) ×2 IMPLANT
SUT VIC AB 2-0 CT1 TAPERPNT 27 (SUTURE) IMPLANT
SUT VIC AB 2-0 CTX 27 (SUTURE) IMPLANT
SUT VIC AB 3-0 SH 27 (SUTURE)
SUT VIC AB 3-0 SH 27X BRD (SUTURE) IMPLANT
SUT VIC AB 3-0 X1 27 (SUTURE) IMPLANT
SUT VICRYL 4-0 PS2 18IN ABS (SUTURE) IMPLANT
SUTURE E-PAK OPEN HEART (SUTURE) ×2 IMPLANT
SYSTEM SAHARA CHEST DRAIN ATS (WOUND CARE) ×2 IMPLANT
TAPE CLOTH SURG 4X10 WHT LF (GAUZE/BANDAGES/DRESSINGS) ×2 IMPLANT
TAPE PAPER 2X10 WHT MICROPORE (GAUZE/BANDAGES/DRESSINGS) ×2 IMPLANT
TOWEL OR 17X24 6PK STRL BLUE (TOWEL DISPOSABLE) ×4 IMPLANT
TOWEL OR 17X26 10 PK STRL BLUE (TOWEL DISPOSABLE) ×4 IMPLANT
TRAY FOLEY IC TEMP SENS 14FR (CATHETERS) ×2 IMPLANT
TUBE FEEDING 8FR 16IN STR KANG (MISCELLANEOUS) ×2 IMPLANT
TUBE SUCT INTRACARD DLP 20F (MISCELLANEOUS) ×2 IMPLANT
TUBING INSUFFLATION 10FT LAP (TUBING) ×2 IMPLANT
UNDERPAD 30X30 INCONTINENT (UNDERPADS AND DIAPERS) ×2 IMPLANT
WATER STERILE IRR 1000ML POUR (IV SOLUTION) ×4 IMPLANT

## 2012-06-09 NOTE — OR Nursing (Signed)
OR Note, Leg Incision made @ 0818, Chest incision made @ [phone]

## 2012-06-09 NOTE — Procedures (Signed)
Extubation Procedure Note  Patient Details:   Name: Adrian Gamble DOB: 18-Aug-1946 MRN: [id number]   Airway Documentation:  AIRWAYS 8 mm (Active)  Secured at (cm) 24 cm 06/09/2012 12:00 AM    Evaluation  O2 sats: stable throughout Complications: No apparent complications Patient did tolerate procedure well. Bilateral Breath Sounds: Diminished;Clear Suctioning: Airway Yes NIF -38 VC 1000 with great efforts  Erskine Speed 06/09/2012,820PM

## 2012-06-09 NOTE — OR Nursing (Signed)
12:35pm - call to vol. Desk to inform family off pump, 1st call to SICU.   13:15pm --2nd call to to SICU

## 2012-06-09 NOTE — H&P (Signed)
[address]            [place] [address]          [phone]        [facility] Medical Record #[id number] Date of Birth: February 18, 1946  Referring: Dr Antoine Poche  Primary Care: Rogelia Boga, MD  Chief Complaint:  Exercise induced chest pain  History of Present Illness:     T. He has a history of coronary disease status post stenting to his right coronary artery in 2001. His last catheterization in 2005 demonstrated LAD stable 60-70% stenosis and a patent stent. He had a stress perfusion study in 2009 demonstrating an EF of 66% with no ischemia or infarct. However, over the past several months he's been having exertional chest discomfort. This is happening with somewhat more intensity with less exertion recently. He thinks it might be like his previous angina though not nearly as severe.  He's had some discomfort at rest. The most severe episode happened while carrying a suitcase up some stairs. It took him 5 minutes to recover from this. He was a moderate substernal burning without radiation to his neck or to his arms. There was no associated symptoms. It when we spontaneously. He's had some decreased exercise tolerance slowly progressive and some increased dyspnea with activities as well.   Last week cardiac xcath and attempted angioplasty was done, now admitted for CABG  Current Activity/ Functional Status: Patient is independent with mobility/ambulation, transfers, ADL's, IADL's.   Past Medical History  Diagnosis Date  . CORONARY ARTERY DISEASE 09/01/2007  . GERD 09/01/2007  . HYPERLIPIDEMIA 09/01/2007  . HYPOTHYROIDISM 05/29/2009  . PEPTIC ULCER DISEASE 09/01/2007  . 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 Impaired glucose tolerance   . H/O hiatal hernia     Past Surgical History  Procedure Date  . Coronary stent placement 2001    RCA  . Tonsillectomy         History  Smoking status  . Current smokes pipe or cigar   daily quit smoking cig 30 years ago  .    Smokeless tobacco  . Never Used  Comment: Distant cigarettes.  Occasional pipe    History  Alcohol Use  . Yes    06/02/12 "not a regular drinker; have had alcohol in past; last time 11/26/11"    History   Social History  . Marital Status: Married    Spouse Name: N/A    Number of Children: N/A  . Years of Education: N/A   Occupational History  . Retired    Social History Main Topics  . Smoking status: Current Some Day Smoker    Types: Cigarettes  . Smokeless tobacco: Never Used   Comment: Distant cigarettes.  Occasional pipe  . Alcohol Use: No  . Drug Use: No  . Sexually Active: Not on file     No Known Allergies  Current Facility-Administered Medications  Medication Dose Route Frequency Provider Last Rate Last Dose  . 0.9 %  sodium chloride infusion   Intravenous Continuous Kathleene Hazel, MD      . acetaminophen (TYLENOL) tablet 650 mg  650 mg Oral Q4H PRN Kathleene Hazel, MD      . aspirin tablet 325 mg  325 mg Oral Daily Kathleene Hazel, MD      . bivalirudin (ANGIOMAX) 250 MG injection           .  fentaNYL (SUBLIMAZE) 0.05 MG/ML injection           . heparin 2-0.9 UNIT/ML-% infusion           . levothyroxine (SYNTHROID, LEVOTHROID) tablet 125 mcg  125 mcg Oral QHS Kathleene Hazel, MD      . lidocaine (XYLOCAINE) 1 % injection           . metoprolol tartrate (LOPRESSOR) tablet 25 mg  25 mg Oral BID Kathleene Hazel, MD   25 mg at 06/01/12 2200  . midazolam (VERSED) 2 MG/2ML injection           . nitroGLYCERIN (NITROSTAT) SL tablet 0.4 mg  0.4 mg Sublingual Q5 min PRN Kathleene Hazel, MD      . nitroGLYCERIN (NTG ON-CALL) 0.2 mg/mL injection           . ondansetron (ZOFRAN) injection 4 mg  4 mg Intravenous Q6H PRN Kathleene Hazel, MD      . simvastatin (ZOCOR) tablet 40 mg  40 mg Oral QHS Kathleene Hazel, MD   40 mg at 06/01/12 2145   Facility-Administered Medications  Ordered in Other Encounters  Medication Dose Route Frequency Provider Last Rate Last Dose  . aspirin chewable tablet 324 mg  324 mg Oral Pre-Cath Rollene Rotunda, MD   324 mg at 06/01/12 0945  . clopidogrel (PLAVIX) tablet 600 mg  600 mg Oral Once Kathleene Hazel, MD   600 mg at 06/01/12 1153    Prescriptions prior to admission  Medication Sig Dispense Refill  . aspirin 81 MG tablet Take 81 mg by mouth daily.      Marland Kitchen levothyroxine (SYNTHROID, LEVOTHROID) 125 MCG tablet Take 1 tablet (125 mcg total) by mouth daily.  90 tablet  6  . metoprolol tartrate (LOPRESSOR) 25 MG tablet Take 1 tablet (25 mg total) by mouth 2 (two) times daily.  60 tablet  6  . simvastatin (ZOCOR) 40 MG tablet Take 1 tablet (40 mg total) by mouth at bedtime.  90 tablet  6  . nitroGLYCERIN (NITROSTAT) 0.4 MG SL tablet Place 1 tablet (0.4 mg total) under the tongue every 5 (five) minutes as needed for chest pain.  25 tablet  prn    Family History  Problem Relation Age of Onset  . Memory loss Father   . Coronary artery disease Father 69    cabg now alive at 76      Review of Systems:     Cardiac Review of Systems: Y or N  Chest Pain [  y  ]  Resting SOB [  n ] Exertional SOB  [ y ]  Pollyann Kennedy Milo.Brash  ]   Pedal Edema [ n  ]    Palpitations [ n ] Syncope  [ n ]   Presyncope [ n  ]  General Review of Systems: [Y] = yes [  ]=no Constitional: recent weight change [ n ]; anorexia [  ]; fatigue Cove.Etienne  ]; nausea [ y ]; night sweats [ n ]; fever [n  ]; or chills [ n ];  Dental: poor dentition[n  ];  Eye : blurred vision [  ]; diplopia [   ]; vision changes [  ];  Amaurosis fugax[  ]; Resp: cough [  ];  wheezing[  ];  hemoptysis[  ]; shortness of breath[  ]; paroxysmal nocturnal dyspnea[  ]; dyspnea on exertion[  ]; or orthopnea[  ];  GI:  gallstones[  ], vomiting[  ];  dysphagia[  ]; melena[   ];  hematochezia [  ]; heartburn[  ];   Hx of  Colonoscopy[ more then 10 years ago  ]; GU: kidney stones [  ]; hematuria[  ];   dysuria [  ];  Nocturia[1- 2 times per night  ];  history of     obstruction [  ];             Skin: rash, swelling[  ];, hair loss[  ];  peripheral edema[  ];  or itching[  ]; Musculosketetal: myalgias[  ];  joint swelling[  ];  joint erythema[  ];  joint pain[  ];  back pain[  ];  Heme/Lymph: bruising[  ];  bleeding[ n ];  anemia[  ];  Neuro: TIA[  ];  headaches[  ];  stroke[  ];  vertigo[  ];  seizures[  ];   paresthesias[  ];  difficulty walking[ n ];  Psych:depression[  ]; anxiety[  ];  Endocrine: diabetes[  ];  thyroid dysfunction[  ];  Immunizations: Flu [ yes ]; Pneumococcal[ yes ];  Other:  Physical Exam: BP 96/65  Pulse 61  Temp 98.2 F (36.8 C) (Oral)  Resp 18  Ht 5\' 8"  (1.727 m)  Wt 219 lb (99.338 kg)  BMI 33.30 kg/m2  SpO2 94%  General appearance: alert, cooperative, appears older than stated age and no distress Neurologic: intact Heart: regular rate and rhythm, S1, S2 normal, no murmur, click, rub or gallop and normal apical impulse Lungs: clear to auscultation bilaterally and normal percussion bilaterally Abdomen: soft, non-tender; bowel sounds normal; no masses,  no organomegaly Extremities: extremities normal, atraumatic, no cyanosis or edema and Homans sign is negative, no sign of DVT Wound: rt groin cath site without hematoma No carotid bruit bilateral, full and equal pedal pulses bilaterally No cervical or supraclavicular adenopathy  Diagnostic Studies & Laboratory data:  CATH: Cardiac Catheterization Operative Report  Philopateer Strine  [id number]  7/1/201311:17 AM  Rogelia Boga, MD  Procedure Performed:  1. Left Heart Catheterization 2. Selective Coronary Angiography 3. Left ventricular angiogram Operator: Verne Carrow, MD  Indication: Unstable angina, known CAD.  Procedure Details:  The risks, benefits,  complications, treatment options, and expected outcomes were discussed with the patient. The patient and/or family concurred with the proposed plan, giving informed consent. The patient was brought to the outpatient cath lab after IV hydration was begun and oral premedication was given. The patient was further sedated with Versed and Fentanyl. The right groin was prepped and draped in the usual manner. Using the modified Seldinger access technique, a 4 French sheath was placed in the right femoral artery. A 3DRC catheter was used to engage the RCA. A JL4 catheter was used to engage the left main. Selective coronary angiography was performed. A pigtail catheter was used to perform a left ventricular angiogram.  There were no immediate complications. The patient was taken to the recovery area in stable condition.  Hemodynamic Findings:  Central aortic pressure: 98/59  Left ventricular pressure: 94/9/13  Angiographic Findings:  Left main: 20% mid stenosis.  Left Anterior Descending Artery: Large caliber vessel that tapers to a smaller caliber vessel in the mid portion. The proximal vessel has mild luminal irregularities. There is a stable 60% stenosis in the mid vessel with moderate calcification. This appears to be unchanged from last cath and does not appear to flow limiting. The distal vessel has no obstructive disease noted. There are two very small caliber diagonal branches.  Circumflex Artery: Large caliber vessel with 3 marginal branches. The first obtuse marginal branch is a moderate sized vessel that appears to have 80% proximal stenosis extending back to the ostium. The second obtuse marginal branch has diffuse mild plaque. The AV groove Circumflex has a 40% stenosis in the mid segment leading into the third obtuse marginal branch.  Right Coronary Artery: Large, dominant vessel with patent stent in the proximal segment. There is minimal restenosis in the stent. The mid vessel has a 60-70% moderate  stenosis with mild calcification. In multiple different views, this appears to be moderate. The distal vessel has mild luminal irregularities.  Left Ventricular Angiogram: LVEF 55-60%.  Impression:  1. Triple vessel CAD with severe stenosis in the obtuse marginal branch of the Circumflex, moderate stenosis in the mid RCA and stable moderate stenosis in the mid LAD  2. Unstable angina which is most likely secondary to the stenosis in the obtuse marginal branch  3. Preserved LV systolic function  Recommendations: Will load with Plavix 600 mg po x 1 now and plan PCI of the OM lesion in the inpatient lab and flow wire (FFR) analysis of the RCA lesion.  Complications: None. The patient tolerated the procedure well.   ATTEMPT PTCA:   Cardiac Catheterization Operative Report  Torie Priebe  [id number]  7/1/20132:19 PM  Rogelia Boga, MD  Procedure Performed:  1. Attempted PTCA of the severe stenosis in the moderate sized obtuse marginal branch of the Circumflex  2. Fractional Flow Reserve of the LAD  Operator: Verne Carrow, MD  Indication: Unstable angina, known CAD. Diagnostic cath this am. Pt found to have severe stenosis of the first OM branch, and moderately severe stenosis in the mid LAD and mid RCA.  Procedure Details:  The risks, benefits, complications, treatment options, and expected outcomes were discussed with the patient. The patient and/or family concurred with the proposed plan, giving informed consent. The patient was brought to the inpatient cath lab from the outpatient cath lab. The patient was further sedated with Versed and Fentanyl. There was a 4 [place] sheath present in the right femoral artery. The right groin was prepped and draped in the usual manner. The 4 French sheath was exchanged for a 6 French sheath in the right femoral artery. The patient had been loaded with Plavix prior to the intervention. He was given a bolus of Angiomax and a drip was started. I then  engaged the left main with a XB LAD 3.5 guiding catheter. When the ACT was greater than 200, I attempted to pass a Cougar IC wire down the Circumflex into the first OM branch. The first OM branch had severe stenosis extending from the ostium through the proximal vessel. I was unable to pass the wire down the OM branch. I changed out for a Whisper wire and used an over the wire balloon for support but was not able to pass the wire down the vessel secondary to the acute takeoff, proximal location and disease in the ostium of the vessel. I felt that his obtuse marginal lesion is probably the culprit vessel.  At this point, I felt that it was important to establish reduction of flow down the LAD secondary to the moderately severe stenosis in the mid vessel. I removed the wires from the Circumflex and passed a pressure wire down the LAD. Baseline Fractional Flow Reserve of 0.90. He was started on an adenosine drip. With adenosine infusion, FFR dropped to 0.82-0.83. This suggested that the flow down the LAD is inhibited but in the intermediate zone for diagnosis of a severely flow limiting lesion. He did complain of severe chest pressure and dyspnea with adenosine infusion. The stenosis in the RCA angiographically appears to be moderate severe. I was unable to perform FFR of the RCA secondary to the patient's intolerance to adenosine which was exhibited during infusion for FFR of the LAD.  There were no immediate complications. The patient was taken to the recovery area in stable condition.  Hemodynamic Findings:  Central aortic pressure: 112/68  Impression:  1. Unstable angina with severe stenosis in ostium of first obtuse marginal branch which could not be approached with PCI secondary to factors listed above  2. LAD stenosis and RCA stenosis in the moderately severe to severe range.  Recommendations: He has triple vessel CAD. His culprit vessel is likely the obtuse marginal branch which could not be  revascularized with PCI as noted above secondary to vessel tortuosity, acute takeoff and severity of stenosis. I believe the lesion in the RCA is severe and the lesion in the mid LAD is at least moderately severe as demonstrated by FFR. I think he would benefit from multi-vessel revascularization with CABG. He will be admitted tonight. Will d/c Plavix. He will need 5 days of Plavix washout. Symptoms are such that he may be able to be discharged home for Plavix washout while awaiting CABG.  Complications: None; patient tolerated the procedure well.   Recent Radiology Findings:  No results found.    Recent Lab Findings: Lab Results  Component Value Date   WBC 5.0 06/05/2012   HGB 16.3 06/05/2012   HCT 45.8 06/05/2012   PLT 146* 06/05/2012   GLUCOSE 171* 06/05/2012   CHOL 202* 06/05/2012   TRIG 177* 06/05/2012   HDL 32* 06/05/2012   LDLCALC 135* 06/05/2012   ALT 49 06/05/2012   AST 45* 06/05/2012   NA 136 06/05/2012   K 3.9 06/05/2012   CL 102 06/05/2012   CREATININE 0.89 06/05/2012   BUN 16 06/05/2012   CO2 22 06/05/2012   TSH 0.85 07/17/2011   INR 1.07 06/05/2012   HGBA1C 6.8* 06/02/2012   Lab Results  Component Value Date   HGBA1C 6.8* 06/02/2012     Assessment / Plan:      CORONARY ARTERY DISEASE With known previous myocardial infarction 12 years ago , now with recurrent new onset of angina and three-vessel coronary artery disease   GERD  HYPERLIPIDEMIA  HYPOTHYROIDISM  PEPTIC ULCER DISEASE  TESTOSTERONE DEFICIENCY  Impaired glucose tolerance  H/O hiatal hernia   I reviewed the cath films with the patient, with his symptoms and three-vessel coronary artery disease and unsuccessful attempted angioplasty I agree that coronary artery bypass grafting offers him the best treatment for relief of symptoms and preservation of LV function. The risks and options were discussed in detail. His plavix has been held. Plan CABG today  The goals risks and alternatives of the planned surgical procedure CABG  have been  discussed with the patient in detail. The risks of the procedure including death, infection, stroke, myocardial infarction, bleeding,  blood transfusion have all been discussed specifically.  I have quoted [name] a 3 % of perioperative mortality and a complication rate as high as 15 %. The patient's questions have been answered.Sara Selvidge is willing  to proceed with the planned procedure.    Delight Ovens MD  Beeper [phone] Office [phone]

## 2012-06-09 NOTE — Progress Notes (Signed)
[facility] BRIEF SICU PROGRESS NOTE  Day of Surgery  S/P Procedure(s) (LRB): CORONARY ARTERY BYPASS GRAFTING (CABG) (N/A)   Somewhat slow to wake up but following commands on vent AAI paced, BP stable PA 40/27 and C.I. 2.6 Chest tube output low UOP excellent Labs okay although pre-extubation gas w/ hypercarbia  Plan: Put back on vent with full rate and wait until hypercarbia resolves, otherwise routine care  Gamble,Adrian H 06/09/2012 6:56 PM

## 2012-06-09 NOTE — Anesthesia Postprocedure Evaluation (Signed)
  Anesthesia Post-op Note  Patient: Adrian Gamble  Procedure(s) Performed: Procedure(s) (LRB): CORONARY ARTERY BYPASS GRAFTING (CABG) (N/A)  Patient Location: SICU  Anesthesia Type: General  Level of Consciousness: sedated and Patient remains intubated per anesthesia plan  Airway and Oxygen Therapy: Patient remains intubated per anesthesia plan and Patient placed on Ventilator (see vital sign flow sheet for setting)  Post-op Pain: none  Post-op Assessment: Post-op Vital signs reviewed, Patient's Cardiovascular Status Stable, Respiratory Function Stable, Patent Airway, No signs of Nausea or vomiting and Pain level controlled  Post-op Vital Signs: stable  Complications: No apparent anesthesia complications

## 2012-06-09 NOTE — Transfer of Care (Signed)
Immediate Anesthesia Transfer of Care Note  Patient: Adrian Gamble  Procedure(s) Performed: Procedure(s) (LRB): CORONARY ARTERY BYPASS GRAFTING (CABG) (N/A)  Patient Location: SICU  Anesthesia Type: General  Level of Consciousness: sedated and Patient remains intubated per anesthesia plan  Airway & Oxygen Therapy: Patient remains intubated per anesthesia plan and Patient placed on Ventilator (see vital sign flow sheet for setting)  Post-op Assessment: Report given to SICU RN and Post -op Vital signs reviewed and stable  Post vital signs: Reviewed and stable  Complications: No apparent anesthesia complications

## 2012-06-09 NOTE — Brief Op Note (Addendum)
06/09/2012  11:27 AM  PATIENT:  Adrian Gamble  66 y.o. male  PRE-OPERATIVE DIAGNOSIS:  CAD  POST-OPERATIVE DIAGNOSIS:  Coronary Artery Disease  PROCEDURE:  Procedure(s): CORONARY ARTERY BYPASS GRAFTING x 4 (LIMA-LAD, SVG-RCA, SVG-OM1-dCx), EVH right leg  SURGEON:  Surgeon(s): Delight Ovens, MD  ASSISTANT: Coral Ceo, PA-C  ANESTHESIA:   general  PATIENT CONDITION:  ICU - intubated and hemodynamically stable.  PRE-OPERATIVE WEIGHT: 99 kg

## 2012-06-09 NOTE — Anesthesia Preprocedure Evaluation (Addendum)
Anesthesia Evaluation  Patient identified by MRN, date of birth, ID band Patient awake    Reviewed: Allergy & Precautions, H&P , NPO status , Patient's Chart, lab work & pertinent test results  History of Anesthesia Complications Negative for: history of anesthetic complications  Airway Mallampati: I TM Distance: >3 FB Neck ROM: full    Dental   Pulmonary sleep apnea , former smoker,          Cardiovascular Exercise Tolerance: Poor METS+ angina with exertion + Past MI Rhythm:regular Rate:Normal  Pt took Lopressor 7/8 @ about 2100hrs   Neuro/Psych negative psych ROS   GI/Hepatic Neg liver ROS, hiatal hernia, PUD, GERD-  Controlled,  Endo/Other    Renal/GU negative Renal ROS  negative genitourinary   Musculoskeletal negative musculoskeletal ROS (+)   Abdominal   Peds  Hematology negative hematology ROS (+)   Anesthesia Other Findings   Reproductive/Obstetrics                         Anesthesia Physical Anesthesia Plan  ASA: III  Anesthesia Plan: General   Post-op Pain Management:    Induction: Intravenous  Airway Management Planned: Oral ETT  Additional Equipment: Arterial line, CVP and PA Cath  Intra-op Plan:   Post-operative Plan: Post-operative intubation/ventilation  Informed Consent: I have reviewed the patients History and Physical, chart, labs and discussed the procedure including the risks, benefits and alternatives for the proposed anesthesia with the patient or authorized representative who has indicated his/her understanding and acceptance.   Dental advisory given  Plan Discussed with: CRNA, Anesthesiologist and Surgeon  Anesthesia Plan Comments:         Anesthesia Quick Evaluation

## 2012-06-10 ENCOUNTER — Inpatient Hospital Stay (HOSPITAL_COMMUNITY): Payer: Medicare HMO

## 2012-06-10 ENCOUNTER — Encounter (HOSPITAL_COMMUNITY): Payer: Self-pay | Admitting: Cardiothoracic Surgery

## 2012-06-10 LAB — GLUCOSE, CAPILLARY
Glucose-Capillary: 101 mg/dL — ABNORMAL HIGH (ref 70–99)
Glucose-Capillary: 103 mg/dL — ABNORMAL HIGH (ref 70–99)
Glucose-Capillary: 105 mg/dL — ABNORMAL HIGH (ref 70–99)
Glucose-Capillary: 110 mg/dL — ABNORMAL HIGH (ref 70–99)
Glucose-Capillary: 117 mg/dL — ABNORMAL HIGH (ref 70–99)
Glucose-Capillary: 126 mg/dL — ABNORMAL HIGH (ref 70–99)
Glucose-Capillary: 127 mg/dL — ABNORMAL HIGH (ref 70–99)
Glucose-Capillary: 127 mg/dL — ABNORMAL HIGH (ref 70–99)
Glucose-Capillary: 129 mg/dL — ABNORMAL HIGH (ref 70–99)
Glucose-Capillary: 129 mg/dL — ABNORMAL HIGH (ref 70–99)
Glucose-Capillary: 131 mg/dL — ABNORMAL HIGH (ref 70–99)
Glucose-Capillary: 133 mg/dL — ABNORMAL HIGH (ref 70–99)
Glucose-Capillary: 134 mg/dL — ABNORMAL HIGH (ref 70–99)
Glucose-Capillary: 134 mg/dL — ABNORMAL HIGH (ref 70–99)
Glucose-Capillary: 139 mg/dL — ABNORMAL HIGH (ref 70–99)
Glucose-Capillary: 141 mg/dL — ABNORMAL HIGH (ref 70–99)
Glucose-Capillary: 142 mg/dL — ABNORMAL HIGH (ref 70–99)
Glucose-Capillary: 150 mg/dL — ABNORMAL HIGH (ref 70–99)
Glucose-Capillary: 150 mg/dL — ABNORMAL HIGH (ref 70–99)
Glucose-Capillary: 160 mg/dL — ABNORMAL HIGH (ref 70–99)
Glucose-Capillary: 95 mg/dL (ref 70–99)

## 2012-06-10 LAB — POCT I-STAT, CHEM 8
BUN: 12 mg/dL (ref 6–23)
Calcium, Ion: 1.18 mmol/L (ref 1.13–1.30)
Chloride: 106 mEq/L (ref 96–112)
Creatinine, Ser: 1.1 mg/dL (ref 0.50–1.35)
Glucose, Bld: 167 mg/dL — ABNORMAL HIGH (ref 70–99)
HCT: 35 % — ABNORMAL LOW (ref 39.0–52.0)
Hemoglobin: 11.9 g/dL — ABNORMAL LOW (ref 13.0–17.0)
Potassium: 4.3 mEq/L (ref 3.5–5.1)
Sodium: 140 mEq/L (ref 135–145)
TCO2: 21 mmol/L (ref 0–100)

## 2012-06-10 LAB — CBC
HCT: 35.3 % — ABNORMAL LOW (ref 39.0–52.0)
HCT: 34.8 % — ABNORMAL LOW (ref 39.0–52.0)
Hemoglobin: 12.3 g/dL — ABNORMAL LOW (ref 13.0–17.0)
Hemoglobin: 11.9 g/dL — ABNORMAL LOW (ref 13.0–17.0)
MCH: 30.5 pg (ref 26.0–34.0)
MCH: 30.4 pg (ref 26.0–34.0)
MCHC: 34.8 g/dL (ref 30.0–36.0)
MCHC: 34.2 g/dL (ref 30.0–36.0)
MCV: 87.6 fL (ref 78.0–100.0)
MCV: 89 fL (ref 78.0–100.0)
RBC: 4.03 MIL/uL — ABNORMAL LOW (ref 4.22–5.81)

## 2012-06-10 LAB — BASIC METABOLIC PANEL
BUN: 10 mg/dL (ref 6–23)
CO2: 23 mEq/L (ref 19–32)
Calcium: 7.9 mg/dL — ABNORMAL LOW (ref 8.4–10.5)
Creatinine, Ser: 0.89 mg/dL (ref 0.50–1.35)
Glucose, Bld: 111 mg/dL — ABNORMAL HIGH (ref 70–99)

## 2012-06-10 MED ORDER — INSULIN GLARGINE 100 UNIT/ML ~~LOC~~ SOLN
20.0000 [IU] | SUBCUTANEOUS | Status: DC
  Administered 2012-06-10 – 2012-06-12 (×3): 20 [IU] via SUBCUTANEOUS

## 2012-06-10 MED ORDER — TRAMADOL HCL 50 MG PO TABS
50.0000 mg | ORAL_TABLET | Freq: Four times a day (QID) | ORAL | Status: DC | PRN
  Administered 2012-06-10 – 2012-06-14 (×13): 50 mg via ORAL
  Filled 2012-06-10 (×13): qty 1

## 2012-06-10 MED ORDER — INSULIN ASPART 100 UNIT/ML ~~LOC~~ SOLN
0.0000 [IU] | SUBCUTANEOUS | Status: DC
  Administered 2012-06-10 – 2012-06-11 (×6): 2 [IU] via SUBCUTANEOUS

## 2012-06-10 MED ORDER — SODIUM CHLORIDE 0.9 % IV SOLN
INTRAVENOUS | Status: DC
  Filled 2012-06-10: qty 1

## 2012-06-10 NOTE — Op Note (Signed)
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  Previous angioplasty to the LAD.  PROCEDURE PERFORMED:  Coronary artery bypass grafting x4 with the left internal mammary to the left anterior descending coronary artery. Sequential reverse saphenous vein graft to the first obtuse marginal and distal circumflex, reverse saphenous vein graft to the distal right coronary artery with right leg endo-vein harvesting.  SURGEON:  Sheliah Plane, MD  FIRST ASSISTANT:  Coral Ceo, PA-C  BRIEF HISTORY:  The patient is a 66 year old male with known coronary artery occlusive disease having had a stent placed in his LAD in the past, approximately 12 years ago.  He presented with new onset of angina, underwent evaluation by Dr. Antoine Poche including cardiac catheterization that revealed high-grade stenosis in the large first obtuse marginal.  In addition, 70-80% stenoses of the LAD, right coronary artery, and more distal circumflex.  Overall, ventricular function was preserved.  Because of the patient's attempted angioplasty of the first obtuse marginal was unsuccessful, the patient had been loaded with Plavix.  Cardiac consultation was obtained and was felt with three-vessel coronary artery disease and symptoms that coronary artery bypass grafting offered the best relief of symptoms and preservation of myocardial function.  Risks and options were discussed with the patient in detail.  He agreed and signed informed consent.  He was discharged home for 5-6 days to allow washout of the  Plavix.  DESCRIPTION OF PROCEDURE:  With Swan-Ganz and arterial line monitors in place, the patient underwent general endotracheal anesthesia without incident.  Skin of the chest and legs were prepped with Betadine and draped in the usual sterile manner.  Time-out procedure was performed. The vein was then harvested from the right thigh and calf, and was of adequate quality and caliber.  With the [employer] endo-vein harvesting system, median sternotomy was performed.  Left internal mammary artery was dissected down as a pedicle graft.  The distal artery was divided, had good free flow.  Pericardium was opened.  Overall, ventricular function appeared preserved.  The patient was systemically heparinized. The ascending aorta was cannulated.  The right atrium was cannulated and aortic root vent cardioplegia needle was introduced into the ascending aorta.  The patient was placed on cardiopulmonary bypass 2.4 L/minute per m square.  Sites of the anastomosis were inspected, dissected out of the epicardium.  The patient's body temperature was cooled to 32 degrees.  Aortic crossclamp was applied 500 mL.  Cold blood potassium cardioplegia was administered with diastolic arrest of the heart. Myocardial septal temperatures monitored throughout the crossclamp. Attention was turned first to the distal right coronary artery, which was significantly calcified just at the takeoff of the posterior descending.  The vessel was opened and admitted to 1.5 mm probe.  The artery was somewhat thickened using a running 7-0 Prolene.  A distal anastomosis was performed.  Additional cold blood cardioplegia was administered down the vein graft.  Heart was then elevated and the first obtuse marginal, which was a 1.5-1.6 mm vessel was opened using a diamond.  Side-to-side anastomosis was carried out with a running 7-0 Prolene.  Distal extent of the same vein was then carried to the very distal circumflex vessel, which  was opened with slightly smaller, but admitted to 1.5 mm probe.  Using a running 7-0 Prolene, distal anastomosis was performed.  Attention was then turned to the left anterior descending coronary artery, which was somewhat diffusely diseased, but had a reasonable lumen.  The vessel was opened and a 1.5 mm probe passed distally.  Using a running 8-0 Prolene, the left internal mammary artery was anastomosed to the left anterior descending coronary.  With release of the bulldog on the mammary artery with rise in myocardial septal temperature, bulldog was placed back on the mammary artery with a crossclamp still in place.  Two punch aortotomies were performed in each.  Two vein grafts were anastomosed to the ascending aorta.  Air was evacuated from the grafts and the aortic crossclamp was removed.  Total cross-clamp time of 80 minutes.  Sites of anastomosis were inspected and were free of bleeding.  The patient spontaneously converted to a sinus rhythm, required atrial pacing to increase rate with body temperature rewarmed to 37 degrees.  He was then ventilated and weaned from cardiopulmonary bypass without difficulty.  Total pump time was 124 minutes.  He was decannulated in usual fashion.  Protamine sulfate was administered with operative field hemostatic.  Atrial and ventricular pacing wires had been applied.  The left pleural tube and a Blake mediastinal drain were left in place.  Sternum was closed with #6 stainless steel wire.  Fascia closed with interrupted 0 Vicryl, running 3-0 Vicryl in subcutaneous tissue, and 4-0 subcuticular stitch in skin edges.  Dry dressings were applied.  Sponge and needle count was reported as correct at the completion of the procedure.  The patient tolerated the procedure without obvious complication, and was transferred to Surgical Intensive Care Unit for further postoperative care.  He did not require any blood bank blood products during the operative  procedure.     Sheliah Plane, MD     EG/MEDQ  D:  06/10/2012  T:  06/10/2012  Job:  [id number]

## 2012-06-10 NOTE — Progress Notes (Signed)
POD#1 s/p CABGx4  He had an episode of nausea earlier (no emesis)  Vital Signs:Temp 97.6,HR 86,RR 18,BP 100/52, and O2 sat 94% on 4 L [place]  Maintaining sinus rhythm  Chest tube with 80 cc of output since 7 am. I/O:1485.9/425.  Labs: WBC 12,300, H and H 11.9 and 34.8, and platelets 110,000.  Pre op HGA1C: 6.8. Will need oral medicine and follow up as an outpatient.  Continue routine post op care. Hopefully, to PCTU soon

## 2012-06-10 NOTE — Progress Notes (Signed)
Patient ID: Adrian Gamble, male   DOB: February 29, 1946, 66 y.o.   MRN: [id number] [facility] DAILY PROGRESS NOTE                   [address]            [place] [address]          [phone]      1 Day Post-Op Procedure(s) (LRB): CORONARY ARTERY BYPASS GRAFTING (CABG) (N/A)  Total Length of Stay:  LOS: 1 day   Subjective: Extubated, alert and neuro intact, good pain control  Objective: Vital signs in last 24 hours: Temp:  [95.5 F (35.3 C)-100.6 F (38.1 C)] 99.3 F (37.4 C) (07/10 0700) Pulse Rate:  [38-106] 67  (07/10 0700) Cardiac Rhythm:  [-] Normal sinus rhythm (07/10 0400) Resp:  [12-23] 17  (07/10 0700) BP: (58-119)/(36-79) 89/49 mmHg (07/10 0700) SpO2:  [90 %-100 %] 97 % (07/10 0700) Arterial Line BP: (66-147)/(41-86) 103/53 mmHg (07/10 0700) FiO2 (%):  [39.8 %-50.2 %] 40 % (07/09 2005) Weight:  [230 lb 2.6 oz (104.4 kg)] 230 lb 2.6 oz (104.4 kg) (07/10 0500)  Filed Weights   06/08/12 1300 06/10/12 0500  Weight: 219 lb (99.338 kg) 230 lb 2.6 oz (104.4 kg)    Weight change: 11 lb 2.6 oz (5.062 kg)   Hemodynamic parameters for last 24 hours: PAP: (24-57)/(14-33) 41/20 mmHg CO:  [3.5 L/min-5.4 L/min] 4.7 L/min CI:  [1.7 L/min/m2-2.6 L/min/m2] 2.2 L/min/m2  Intake/Output from previous day: 07/09 0701 - 07/10 0700 In: 2130.8 [P.O.:450; I.V.:4295.5; Blood:621; NG/GT:30; IV Piggyback:1270] Out: 5510 [Urine:3925; Emesis/NG output:100; Blood:850; Chest Tube:635]  Intake/Output this shift:    Current Meds: Scheduled Meds:   . acetaminophen (TYLENOL) oral liquid 160 mg/5 mL  650 mg Per Tube NOW   Or  . acetaminophen  650 mg Rectal NOW  . acetaminophen  1,000 mg Oral Q6H   Or  . acetaminophen (TYLENOL) oral liquid 160 mg/5 mL  975 mg Per Tube Q6H  . aspirin EC  325 mg Oral Daily   Or  . aspirin  324 mg Per Tube Daily  . bisacodyl  10 mg Oral Daily   Or  . bisacodyl  10 mg Rectal Daily  . cefUROXime (ZINACEF)  IV  1.5 g Intravenous To OR  .  cefUROXime (ZINACEF)  IV  1.5 g Intravenous Q12H  . Chlorhexidine Gluconate Cloth  6 each Topical Daily  . docusate sodium  200 mg Oral Daily  . insulin regular  0-10 Units Intravenous TID WC  . levothyroxine  125 mcg Oral Daily  . magnesium sulfate  4 g Intravenous Once  . metoprolol tartrate  12.5 mg Oral BID   Or  . metoprolol tartrate  12.5 mg Per Tube BID  . mupirocin ointment  1 application Nasal BID  . nitroglycerin-nicardipine-HEPARIN-sodium bicarbonate irrigation for artery spasm   Irrigation To OR  . pantoprazole  40 mg Oral Q1200  . potassium chloride  10 mEq Intravenous Q1 Hr x 3  . simvastatin  40 mg Oral QHS  . sodium chloride  3 mL Intravenous Q12H  . vancomycin  1,500 mg Intravenous To OR  . vancomycin  1,000 mg Intravenous Once  Continuous Infusions:   . sodium chloride    . sodium chloride 20 mL (06/09/12 1400)  . sodium chloride 250 mL (06/10/12 0600)  . dexmedetomidine Stopped (06/10/12 0200)  . insulin (NOVOLIN-R) infusion 3.4 Units/hr (06/10/12 0645)  . lactated ringers 60 mL (06/09/12 1400)  . nitroGLYCERIN    . phenylephrine (NEO-SYNEPHRINE) Adult infusion 55 mcg/min (06/10/12 0600)  . DISCONTD: insulin (NOVOLIN-R) infusion 1.5 Units/hr (06/09/12 1400)   PRN Meds:.albumin human, lactated ringers, metoprolol, midazolam, morphine injection, morphine injection, ondansetron (ZOFRAN) IV, oxyCODONE, sodium chloride, DISCONTD: 0.9 % irrigation (POUR BTL), DISCONTD: hemostatic agents, DISCONTD: hemostatic agents  General appearance: alert, cooperative and no distress Neurologic: intact Heart: regular rate and rhythm, S1, S2 normal, no murmur, click, rub or gallop and normal apical impulse Lungs: diminished breath sounds bibasilar Abdomen: soft, non-tender; bowel sounds normal; no masses,  no organomegaly Wound: stable sternum  Lab Results: CBC: Basename 06/10/12 0420  06/09/12 2021 06/09/12 2015  WBC 11.9* -- 10.5  HGB 12.3* 12.2* --  HCT 35.3* 36.0* --  PLT 127* -- 113*   BMET:  Basename 06/10/12 0420 06/09/12 2021  NA 142 143  K 4.2 4.2  CL 111 108  CO2 23 --  GLUCOSE 111* 137*  BUN 10 10  CREATININE 0.89 0.80  CALCIUM 7.9* --    PT/INR:  Basename 06/09/12 1410  LABPROT 18.1*  INR 1.47   Radiology: Dg Chest Portable 1 View  06/09/2012  *RADIOLOGY REPORT*  Clinical Data: Postop for CABG.  PORTABLE CHEST - 1 VIEW  Comparison: 06/02/2012  Findings: Endotracheal tube terminates 5.6 cm above carina. Nasogastric tube terminates at the body of the stomach.  The side port may be above the gastroesophageal junction.  Interval median sternotomy.  Right IJ Swan-Ganz catheter with tip at proximal right pulmonary artery.  A left-sided chest tube is in place. No pneumothorax.  Trace left-sided pleural fluid with mild bibasilar atelectasis. Normal heart size for level of inspiration.  IMPRESSION: Expected appearance after median sternotomy.  Original Report Authenticated By: Consuello Bossier, M.D.     Assessment/Plan: S/P Procedure(s) (LRB): CORONARY ARTERY BYPASS GRAFTING (CABG) (N/A) Mobilize Diuresis Diabetes control d/c tubes/lines Continue foley due to strict I&O, patient in ICU and urinary output monitoring See progression orders     Delight Ovens MD  Beeper [phone] Office [phone] 06/10/2012 8:00 AM

## 2012-06-11 ENCOUNTER — Inpatient Hospital Stay (HOSPITAL_COMMUNITY): Payer: Medicare HMO

## 2012-06-11 LAB — GLUCOSE, CAPILLARY
Glucose-Capillary: 109 mg/dL — ABNORMAL HIGH (ref 70–99)
Glucose-Capillary: 129 mg/dL — ABNORMAL HIGH (ref 70–99)
Glucose-Capillary: 132 mg/dL — ABNORMAL HIGH (ref 70–99)
Glucose-Capillary: 137 mg/dL — ABNORMAL HIGH (ref 70–99)
Glucose-Capillary: 148 mg/dL — ABNORMAL HIGH (ref 70–99)
Glucose-Capillary: 97 mg/dL (ref 70–99)

## 2012-06-11 LAB — BASIC METABOLIC PANEL
BUN: 15 mg/dL (ref 6–23)
CO2: 26 mEq/L (ref 19–32)
Calcium: 8.1 mg/dL — ABNORMAL LOW (ref 8.4–10.5)
Chloride: 104 mEq/L (ref 96–112)
Creatinine, Ser: 1.01 mg/dL (ref 0.50–1.35)
GFR calc Af Amer: 87 mL/min — ABNORMAL LOW (ref >90)
GFR calc non Af Amer: 75 mL/min — ABNORMAL LOW (ref >90)
Glucose, Bld: 141 mg/dL — ABNORMAL HIGH (ref 70–99)
Potassium: 4.2 mEq/L (ref 3.5–5.1)
Sodium: 137 mEq/L (ref 135–145)

## 2012-06-11 LAB — CBC
HCT: 30.3 % — ABNORMAL LOW (ref 39.0–52.0)
Hemoglobin: 10.4 g/dL — ABNORMAL LOW (ref 13.0–17.0)
MCH: 30.7 pg (ref 26.0–34.0)
MCHC: 34.3 g/dL (ref 30.0–36.0)
MCV: 89.4 fL (ref 78.0–100.0)
Platelets: 89 10*3/uL — ABNORMAL LOW (ref 150–400)
RBC: 3.39 MIL/uL — ABNORMAL LOW (ref 4.22–5.81)
RDW: 13.4 % (ref 11.5–15.5)
WBC: 9.7 10*3/uL (ref 4.0–10.5)

## 2012-06-11 MED ORDER — BISACODYL 10 MG RE SUPP: 10.0000 mg | Freq: Every day | RECTAL | Status: DC | PRN

## 2012-06-11 MED ORDER — MOVING RIGHT ALONG BOOK
Freq: Once | Status: AC
  Administered 2012-06-11: 10:00:00
  Filled 2012-06-11: qty 1

## 2012-06-11 MED ORDER — FUROSEMIDE 10 MG/ML IJ SOLN
40.0000 mg | Freq: Once | INTRAMUSCULAR | Status: AC
  Administered 2012-06-11: 40 mg via INTRAVENOUS
  Filled 2012-06-11: qty 4

## 2012-06-11 MED ORDER — ONDANSETRON HCL 4 MG PO TABS
4.0000 mg | ORAL_TABLET | Freq: Four times a day (QID) | ORAL | Status: DC | PRN
  Administered 2012-06-11: 4 mg via ORAL
  Filled 2012-06-11: qty 1

## 2012-06-11 MED ORDER — BISACODYL 5 MG PO TBEC: 10.0000 mg | DELAYED_RELEASE_TABLET | Freq: Every day | ORAL | Status: DC | PRN

## 2012-06-11 MED ORDER — SODIUM CHLORIDE 0.9 % IV SOLN: 250.0000 mL | INTRAVENOUS | Status: DC | PRN

## 2012-06-11 MED ORDER — POTASSIUM CHLORIDE CRYS ER 20 MEQ PO TBCR
20.0000 meq | EXTENDED_RELEASE_TABLET | Freq: Every day | ORAL | Status: AC
  Administered 2012-06-11 – 2012-06-13 (×3): 20 meq via ORAL
  Filled 2012-06-11 (×4): qty 1

## 2012-06-11 MED ORDER — SODIUM CHLORIDE 0.9 % IJ SOLN
3.0000 mL | Freq: Two times a day (BID) | INTRAMUSCULAR | Status: DC
  Administered 2012-06-11 – 2012-06-13 (×5): 3 mL via INTRAVENOUS

## 2012-06-11 MED ORDER — METOPROLOL TARTRATE 12.5 MG HALF TABLET
12.5000 mg | ORAL_TABLET | Freq: Two times a day (BID) | ORAL | Status: DC
  Administered 2012-06-11 – 2012-06-14 (×6): 12.5 mg via ORAL
  Filled 2012-06-11 (×7): qty 1

## 2012-06-11 MED ORDER — OXYCODONE HCL 5 MG PO TABS
5.0000 mg | ORAL_TABLET | ORAL | Status: DC | PRN
  Filled 2012-06-11: qty 2

## 2012-06-11 MED ORDER — ALUM & MAG HYDROXIDE-SIMETH 200-200-20 MG/5ML PO SUSP: 15.0000 mL | ORAL | Status: DC | PRN

## 2012-06-11 MED ORDER — INSULIN ASPART 100 UNIT/ML ~~LOC~~ SOLN
0.0000 [IU] | Freq: Three times a day (TID) | SUBCUTANEOUS | Status: DC
  Administered 2012-06-11: 2 [IU] via SUBCUTANEOUS

## 2012-06-11 MED ORDER — ASPIRIN EC 325 MG PO TBEC
325.0000 mg | DELAYED_RELEASE_TABLET | Freq: Every day | ORAL | Status: DC
  Administered 2012-06-12 – 2012-06-14 (×3): 325 mg via ORAL
  Filled 2012-06-11 (×3): qty 1

## 2012-06-11 MED ORDER — DOCUSATE SODIUM 100 MG PO CAPS
200.0000 mg | ORAL_CAPSULE | Freq: Every day | ORAL | Status: DC
  Administered 2012-06-12 – 2012-06-14 (×3): 200 mg via ORAL
  Filled 2012-06-11 (×3): qty 2

## 2012-06-11 MED ORDER — FUROSEMIDE 40 MG PO TABS
40.0000 mg | ORAL_TABLET | Freq: Every day | ORAL | Status: AC
  Administered 2012-06-12 – 2012-06-13 (×2): 40 mg via ORAL
  Filled 2012-06-11 (×2): qty 1

## 2012-06-11 MED ORDER — PANTOPRAZOLE SODIUM 40 MG PO TBEC
40.0000 mg | DELAYED_RELEASE_TABLET | Freq: Every day | ORAL | Status: DC
  Administered 2012-06-12 – 2012-06-14 (×3): 40 mg via ORAL
  Filled 2012-06-11 (×3): qty 1

## 2012-06-11 MED ORDER — ONDANSETRON HCL 4 MG/2ML IJ SOLN: 4.0000 mg | Freq: Four times a day (QID) | INTRAMUSCULAR | Status: DC | PRN

## 2012-06-11 MED ORDER — SODIUM CHLORIDE 0.9 % IJ SOLN: 3.0000 mL | INTRAMUSCULAR | Status: DC | PRN

## 2012-06-11 MED FILL — Sodium Chloride IV Soln 0.9%: INTRAVENOUS | Qty: 1000 | Status: AC

## 2012-06-11 MED FILL — Mannitol IV Soln 20%: INTRAVENOUS | Qty: 500 | Status: AC

## 2012-06-11 MED FILL — Sodium Chloride Irrigation Soln 0.9%: Qty: 3000 | Status: AC

## 2012-06-11 MED FILL — Electrolyte-R (PH 7.4) Solution: INTRAVENOUS | Qty: 6000 | Status: AC

## 2012-06-11 NOTE — Progress Notes (Addendum)
[address]            [place] [address]          [phone]     2 Days Post-Op Procedure(s) (LRB): CORONARY ARTERY BYPASS GRAFTING (CABG) (N/A)  Subjective: OOB in chair.  Feels better than yesterday.  +BM, nausea resolved.  Walked in halls already this am.  Objective: Vital signs in last 24 hours: Patient Vitals for the past 24 hrs:  BP Temp Temp src Pulse Resp SpO2  06/11/12 0821 - 98.6 F (37 C) Oral - - -  06/11/12 0800 111/58 mmHg - - 94  - 96 %  06/11/12 0700 110/65 mmHg - - 90  - 99 %  06/11/12 0600 124/64 mmHg - - 94  17  96 %  06/11/12 0500 93/54 mmHg - - 80  12  98 %  06/11/12 0400 85/46 mmHg 97.5 F (36.4 C) Oral 82  11  91 %  06/11/12 0337 - - - 48  11  99 %  06/11/12 0300 87/50 mmHg - - 86  15  100 %  06/11/12 0200 99/55 mmHg - - 46  14  86 %  06/11/12 0100 96/55 mmHg - - 108  12  86 %  06/11/12 0000 95/56 mmHg 97.8 F (36.6 C) Oral 80  13  93 %  06/10/12 2300 95/60 mmHg - - 87  17  91 %  06/10/12 2200 103/50 mmHg - - 86  18  94 %  06/10/12 2100 106/57 mmHg - - 84  18  95 %  06/10/12 2000 91/58 mmHg - - 81  17  95 %  06/10/12 1928 - 98 F (36.7 C) Oral - - -  06/10/12 1900 94/54 mmHg - - 86  20  95 %  06/10/12 1800 100/52 mmHg - - 86  18  94 %  06/10/12 1700 91/48 mmHg - - 76  14  95 %  06/10/12 1630 84/55 mmHg - - 79  16  93 %  06/10/12 1600 95/54 mmHg - - 80  17  97 %  06/10/12 1537 - 97.6 F (36.4 C) Oral - - -  06/10/12 1500 120/52 mmHg - - 93  26  90 %  06/10/12 1430 105/65 mmHg - - - 15  -  06/10/12 1400 100/56 mmHg - - 90  20  92 %  06/10/12 1330 100/61 mmHg - - 123  17  92 %  06/10/12 1300 97/60 mmHg - - 80  19  97 %  06/10/12 1200 98/53 mmHg - - 76  19  93 %  06/10/12 1155 - 98.3 F (36.8 C) Oral - - -  06/10/12 1100 105/57 mmHg - - 87  20  97 %  06/10/12 1000 82/49 mmHg - - 72  17  97 %  06/10/12 0900 83/54 mmHg - - 70  14  97 %   Current Weight  06/10/12 230 lb 2.6 oz (104.4 kg)  PRE-OPERATIVE  WEIGHT: 99 kg    Intake/Output from previous day: 07/10 0701 - 07/11 0700 In: 1754.6 [P.O.:960; I.V.:694.6; IV Piggyback:100] Out: 935 [Urine:805; Chest Tube:130]  CBGs 167-160-148-137-141-129  PHYSICAL EXAM:  Heart: RRR Lungs: few basilar crackles Wound: clean and dry Extremities: some LE edema, R>L    Lab Results: CBC: Basename 06/11/12 0440 06/10/12 1627  WBC 9.7 12.3*  HGB 10.4* 11.9*  HCT 30.3* 34.8*  PLT 89* 110*   BMET:  Basename 06/11/12 0440 06/10/12 1627 06/10/12 1607 06/10/12 0420  NA 137 -- 140 --  K 4.2 -- 4.3 --  CL 104 -- 106 --  CO2 26 -- -- 23  GLUCOSE 141* -- 167* --  BUN 15 -- 12 --  CREATININE 1.01 1.06 -- --  CALCIUM 8.1* -- -- 7.9*    PT/INR:  Basename 06/09/12 1410  LABPROT 18.1*  INR 1.47   CXR: IMPRESSION:  Details of support apparatus as given above. Chest tube in place  without significant change. No definite pneumothorax evident.  Stable cardiac silhouette enlargement. Low lung volumes with  basilar atelectasis and infiltrate densities. No consolidation  evident.     Assessment/Plan: S/P Procedure(s) (LRB): CORONARY ARTERY BYPASS GRAFTING (CABG) (N/A) CV- SR, BPs a little low, but off all drips at present. Continue low dose Lopressor as tolerated. Vol overload- continue diuresis. CT output low and no air leak.  Will likely d/c CT this am. D/c Foley, mobilize, continue pulm toilet. DM- continue Lantus.  Will probably need po meds when eating better (A1C=6.8). Thrombocytopenia- plts down slightly this am.  Watch. Probably ready for tx to PTCU later today.   LOS: 2 days    COLLINS,GINA H 06/11/2012   to ptcu I have seen and examined Adrian Gamble and agree with the above assessment  and plan.  Delight Ovens MD Beeper [phone] Office [phone] 06/11/2012 10:01 AM

## 2012-06-11 NOTE — Progress Notes (Signed)
Pt c/o nausea; pt given 4mg  PO Zofran at this time; will cont. To monitor.

## 2012-06-11 NOTE — Progress Notes (Signed)
Patient transferred to unit 2000.  Report given to Asher Muir, Charity fundraiser.  VVS.  Wife called to make aware of the transfer.  RN at bedside.

## 2012-06-11 NOTE — Progress Notes (Signed)
CARDIAC REHAB PHASE I   PRE:  Rate/Rhythm: 92SR  BP:  Supine:   Sitting: 114/60  Standing:    SaO2: 90-91%RA  MODE:  Ambulation: 240 ft   POST:  Rate/Rhythem: 111ST  BP:  Supine:   Sitting:  Standing:  Bathroom urgently   SaO2: 96%2L 1450-1522 Pt walked 240 ft on 2L with rolling walker and asst x 2. Cut walk short due to bathroom urgency. Tolerated walk well. Slightly SOB. To recliner with call bell after walk. Can be asst x 1.  Duanne Limerick

## 2012-06-11 NOTE — Progress Notes (Signed)
Pt sleeping in chair at this time; no c/o pain or nausea at this time; will cont. To monitor.

## 2012-06-11 NOTE — Care Management Note (Unsigned)
    Page 1 of 1   06/11/2012     10:28:29 AM   CARE MANAGEMENT NOTE 06/11/2012  Patient:  Adrian Gamble, Adrian Gamble   Account Number:  [id number]  Date Initiated:  06/10/2012  Documentation initiated by:  Gaila Engebretsen  Subjective/Objective Assessment:   PT S/P CABG X 4 ON 06/09/12.  PTA, PT INDEPENDENT, LIVES WITH WIFE.     Action/Plan:   MET WITH PT TO DISCUSS DC PLANS. PT STATES WIFE AND SISTER WILL PROVIDE 24HR CARE AT DISCHARGE.  WILL FOLLOW FOR HOME NEEDS AS PT PROGRESSES.   Anticipated DC Date:  06/15/2012   Anticipated DC Plan:  HOME W HOME HEALTH SERVICES      DC Planning Services  CM consult      Choice offered to / List presented to:             Status of service:  In process, will continue to follow Medicare Important Message given?   (If response is "NO", the following Medicare IM given date fields will be blank) Date Medicare IM given:   Date Additional Medicare IM given:    Discharge Disposition:    Per UR Regulation:    If discussed at Long Length of Stay Meetings, dates discussed:    Comments:

## 2012-06-12 ENCOUNTER — Inpatient Hospital Stay (HOSPITAL_COMMUNITY): Payer: Medicare HMO

## 2012-06-12 LAB — CBC
HCT: 31.5 % — ABNORMAL LOW (ref 39.0–52.0)
Hemoglobin: 10.6 g/dL — ABNORMAL LOW (ref 13.0–17.0)
MCH: 30 pg (ref 26.0–34.0)
MCHC: 33.7 g/dL (ref 30.0–36.0)
MCV: 89.2 fL (ref 78.0–100.0)
Platelets: 111 10*3/uL — ABNORMAL LOW (ref 150–400)
RBC: 3.53 MIL/uL — ABNORMAL LOW (ref 4.22–5.81)
RDW: 13.6 % (ref 11.5–15.5)
WBC: 10.2 10*3/uL (ref 4.0–10.5)

## 2012-06-12 LAB — BASIC METABOLIC PANEL
BUN: 17 mg/dL (ref 6–23)
CO2: 27 mEq/L (ref 19–32)
Calcium: 8.5 mg/dL (ref 8.4–10.5)
Chloride: 101 mEq/L (ref 96–112)
Creatinine, Ser: 0.88 mg/dL (ref 0.50–1.35)
GFR calc Af Amer: >90 mL/min (ref >90)
GFR calc non Af Amer: 87 mL/min — ABNORMAL LOW (ref >90)
Glucose, Bld: 129 mg/dL — ABNORMAL HIGH (ref 70–99)
Potassium: 3.8 mEq/L (ref 3.5–5.1)
Sodium: 137 mEq/L (ref 135–145)

## 2012-06-12 LAB — GLUCOSE, CAPILLARY
Glucose-Capillary: 132 mg/dL — ABNORMAL HIGH (ref 70–99)
Glucose-Capillary: 99 mg/dL (ref 70–99)
Glucose-Capillary: 100 mg/dL — ABNORMAL HIGH (ref 70–99)
Glucose-Capillary: 107 mg/dL — ABNORMAL HIGH (ref 70–99)

## 2012-06-12 MED ORDER — FUROSEMIDE 40 MG PO TABS: 40.0000 mg | ORAL_TABLET | Freq: Every day | ORAL | Status: DC

## 2012-06-12 MED ORDER — POTASSIUM CHLORIDE CRYS ER 20 MEQ PO TBCR: 20.0000 meq | EXTENDED_RELEASE_TABLET | Freq: Every day | ORAL | Status: DC

## 2012-06-12 MED ORDER — OXYCODONE HCL 5 MG PO TABS: 5.0000 mg | ORAL_TABLET | ORAL | Status: DC | PRN

## 2012-06-12 MED ORDER — METFORMIN HCL 500 MG PO TABS
500.0000 mg | ORAL_TABLET | Freq: Two times a day (BID) | ORAL | Status: DC
  Administered 2012-06-12 – 2012-06-14 (×5): 500 mg via ORAL
  Filled 2012-06-12 (×7): qty 1

## 2012-06-12 MED ORDER — ASPIRIN 325 MG PO TBEC: 325.0000 mg | DELAYED_RELEASE_TABLET | Freq: Every day | ORAL | Status: AC

## 2012-06-12 MED ORDER — METFORMIN HCL 500 MG PO TABS: 500.0000 mg | ORAL_TABLET | Freq: Two times a day (BID) | ORAL | Status: DC

## 2012-06-12 MED ORDER — POTASSIUM CHLORIDE CRYS ER 20 MEQ PO TBCR
20.0000 meq | EXTENDED_RELEASE_TABLET | Freq: Once | ORAL | Status: AC
  Administered 2012-06-12: 20 meq via ORAL

## 2012-06-12 MED ORDER — METOPROLOL TARTRATE 25 MG PO TABS: 12.5000 mg | ORAL_TABLET | Freq: Two times a day (BID) | ORAL | Status: DC

## 2012-06-12 NOTE — Progress Notes (Addendum)
3 Days Post-Op Procedure(s) (LRB): CORONARY ARTERY BYPASS GRAFTING (CABG) (N/A)  Subjective: Patient feeling a little better each day.  Objective: Vital signs in last 24 hours: Patient Vitals for the past 24 hrs:  BP Temp Temp src Pulse Resp SpO2 Weight  06/12/12 0408 98/62 mmHg 99.2 F (37.3 C) Oral 87  18  92 % 225 lb 15.5 oz (102.5 kg)  06/11/12 2134 116/67 mmHg - - - - - -  06/11/12 1939 101/66 mmHg 99 F (37.2 C) Oral 95  18  95 % -  06/11/12 1454 - - - - - 94 % -  06/11/12 1437 114/60 mmHg 98.4 F (36.9 C) Oral 97  18  92 % -  06/11/12 1300 89/56 mmHg - - 87  - 97 % -  06/11/12 1214 - 98.1 F (36.7 C) Oral - - - -  06/11/12 1200 111/70 mmHg - - 100  - 87 % -  06/11/12 1107 - - - 89  - 98 % -  06/11/12 1100 98/56 mmHg - - 88  - 99 % -  06/11/12 1000 130/67 mmHg - - 100  25  95 % -  06/11/12 0900 110/58 mmHg - - 90  14  94 % -  06/11/12 0821 - 98.6 F (37 C) Oral - - - -  06/11/12 0800 111/58 mmHg - - 94  - 96 % -   Pre op weight  99.3 kg Current Weight  06/12/12 225 lb 15.5 oz (102.5 kg)      Intake/Output from previous day: 07/11 0701 - 07/12 0700 In: 383 [P.O.:360; I.V.:23] Out: 45 [Urine:20; Chest Tube:25]   Physical Exam:  Cardiovascular: RRR, no murmurs, gallops, or rubs. Pulmonary: Diminished at bases bilaterally; no rales, wheezes, or rhonchi. Abdomen: Soft, non tender, bowel sounds present. Extremities: Bilateral lower extremity edema. Wounds: Clean and dry.  No erythema or signs of infection.  Lab Results: CBC: Basename 06/12/12 0600 06/11/12 0440  WBC 10.2 9.7  HGB 10.6* 10.4*  HCT 31.5* 30.3*  PLT 111* 89*   BMET:  Basename 06/12/12 0600 06/11/12 0440  NA 137 137  K 3.8 4.2  CL 101 104  CO2 27 26  GLUCOSE 129* 141*  BUN 17 15  CREATININE 0.88 1.01  CALCIUM 8.5 8.1*     Assessment/Plan:  1. CV - SR.Contine Lopressor 12. 5 bid.Will hold this am if SBP less than 100. 2.  Pulmonary - Encourage incentive spirometer and flutter  valve.Wean )2 as tolerates (on 4L via [place]).CXR this am shows some improvement in aeration, still with low lung volumes, small bilateral pleural effusions with atelectasis. 3. Volume Overload - Diurese as blood pressure allows. 4.  Acute blood loss anemia - H and H this am stable at 10.6 and 31.5. 5.Thrombocytpenia-platelets increased from 89,000 to 111,000. 6.Supplement potassium 7.Pre op HGA1C 6.8. CBGs 109/97/132.Will stop scheduled insulin and start metformin. He will need follow up as an outpatient. 8.Low grade fever likely secondary to atelectasis (WBC within normal limits and no signs of wound infection). 9.Remove EPW in am. 10.Possible discharge Sunday or Monday, if off O2.  ZIMMERMAN,DONIELLE MPA-C 06/12/2012   home Sunday or Monday I have seen and examined Adrian Gamble and agree with the above assessment  and plan.  Delight Ovens MD Beeper [phone] Office [phone] 06/12/2012 6:37 PM

## 2012-06-12 NOTE — Discharge Summary (Signed)
Physician Discharge Summary  Patient ID: Adrian Gamble MRN: [id number] DOB/AGE: Dec 27, 1945 66 y.o.  Admit date: 06/09/2012 Discharge date: 06/14/2012  Admission Diagnoses: 1. History of CAD (s/p PCI with stent to RCA, 01') 2.History of hyperlipidemia 3.History of glucose intolerance 4.History of GERD 5. History of hypothyroidism  Discharge Diagnoses:  1. History of CAD (s/p PCI with stent to RCA, 01') 2.History of hyperlipidemia 3.History of glucose intolerance 4.History of GERD 5. History of hypothyroidism 6.Mild ABL anemia 7.Thrombocytopenia  Procedure (s):  Coronary artery bypass grafting x4 with the left  internal mammary to the left anterior descending coronary artery. Sequential reverse saphenous vein graft to the first obtuse marginal and distal circumflex, reverse saphenous vein graft to the distal right coronary artery with right leg endo-vein harvesting by Dr. Tyrone Sage on 06/09/2012.  History of Presenting Illness: This is a 66 year old Caucasian male who has a history of coronary disease (status post stenting to his right coronary artery in 2001.) His last catheterization in 2005 demonstrated stable LAD disease 60-70%  and a patent stent. He had a stress perfusion study in 2009 that demonstrated an EF of 66%, with no ischemia or infarct. Over the past several months, he's been having exertional chest discomfort. This is happening with somewhat more intensity and with less exertion recently. He thinks it might be like his previous angina, though not nearly as severe. He's also had some discomfort at rest. The most severe episode happened while carrying a suitcase up some stairs. It took him 5 minutes to recover from this. He had a moderate substernal burning without radiation to his neck or to his arms. There were no associated symptoms. It then went away spontaneously. He'salso had some decreased exercise tolerance slowly progressive and some increased dyspnea with activities as well.  A cardiac catheterization was done by Dr. Sanjuana Kava on 06/01/2012. He was found to have multivessel CAD. A cardiothoracic consultation was then obtained with Dr. Tyrone Sage for the consideration of CABG. Potential risks, benefits, and complications of the surgery were discussed with the patient and he agreed to proceed.The patient had been given Plavix. As a result, we had to have a Plavix washout prior to undergoing surgery. He underwent a CABG x4 on 06/09/2012.  Brief Hospital Course:  He was extubated without difficulty early the morning of POD 1.  He remained afebrile and hemodynamically stable. His Theone Murdoch, a line, chest tubes, and foley were all removed early in his post operative course.He was started on Lopressor. He was volume overloaded and diuresed accordingly. He was found to have thrombocytopenia. His last platelet count was 111,000.He was also found to have mild ABL anemia. His H and H went as low as 11.9 and 34.8. He did not require a transfusion.He was felt surgically stable for transfer from the ICU to [facility] for further convalescence on 06/11/2012.He continue to progress with cardiac rehab. He did require several liters of O2 via [place] post op. He was gradually weaned to room air.He has already been tolerating a diabetic diet and has had a bowel movement. He had a history of glucose intolerance.His HGA1C pre op was 6.8. He was weaned off the insulin drip and givne Metformin on POD# 3 with good glucose control. He will need to follow up with his medical doctor, Dr. Amador Cunas, upon discharge. His EPW and chest tube sutures will be removed prior to his discharge. Provided he remains afebrile, hemodynamically stable, and pending morning round evaluation, he will be surgically stable for discharge on  Sunday 06/14/2012.   Latest Vital Signs: Blood pressure 98/62, pulse 87, temperature 99.2 F (37.3 C), temperature source Oral, resp. rate 18, height 5\' 8"  (1.727 m), weight 225 lb 15.5 oz (102.5 kg), SpO2  92.00%.  Physical Exam: Cardiovascular: RRR, no murmurs, gallops, or rubs.  Pulmonary: Diminished at bases bilaterally; no rales, wheezes, or rhonchi.  Abdomen: Soft, non tender, bowel sounds present.  Extremities: Bilateral lower extremity edema.  Wounds: Clean and dry. No erythema or signs of infection.  Discharge Condition:Stable  Recent laboratory studies:  Lab Results  Component Value Date   WBC 10.2 06/12/2012   HGB 10.6* 06/12/2012   HCT 31.5* 06/12/2012   MCV 89.2 06/12/2012   PLT 111* 06/12/2012   Lab Results  Component Value Date   NA 137 06/12/2012   K 3.8 06/12/2012   CL 101 06/12/2012   CO2 27 06/12/2012   CREATININE 0.88 06/12/2012   GLUCOSE 129* 06/12/2012     Diagnostic Studies: Dg Chest 2 View  06/12/2012  *RADIOLOGY REPORT*  Clinical Data: 66 year old male status post CABG.  Chest tube removal.  CHEST - 2 VIEW  Comparison: 06/11/2012 and earlier.  Findings: Right IJ introducer sheath and left chest tube have been removed.  No pneumothorax.  Sequelae of CABG.  Low lung volumes with small bilateral pleural effusions and streaky lower lobe opacity.  Epicardial pacer wires remain in place. No acute osseous abnormality identified. Visualized tracheal air column is within normal limits.  IMPRESSION: 1.  Left chest tube removed.  Right IJ introducer sheath removed. No pneumothorax. 2.  Low lung volumes with small bilateral pleural effusions and lower lobe atelectasis or consolidation.  Original Report Authenticated By: H.LEE c  Discharge Orders    Future Appointments: Provider: Department: Dept Phone: Center:   08/11/2012 8:30 AM [facility] [phone] [facility]   08/18/2012 8:45 AM Gordy Savers, MD [facility] [phone] [facility]     Future Orders Please Complete By Expires   Amb Referral to Cardiac Rehabilitation         Discharge Medications: Medication List  As of 06/12/2012 12:54 PM   Medication List  As of 06/12/2012  1:10 PM   STOP  taking these medications         aspirin 81 MG tablet      nitroGLYCERIN 0.4 MG SL tablet         TAKE these medications         aspirin 325 MG EC tablet   Take 1 tablet (325 mg total) by mouth daily.      furosemide 40 MG tablet   Commonly known as: LASIX   Take 1 tablet (40 mg total) by mouth daily. For 5 days then stop.      levothyroxine 125 MCG tablet   Commonly known as: SYNTHROID, LEVOTHROID   Take 1 tablet (125 mcg total) by mouth daily.      metFORMIN 500 MG tablet   Commonly known as: GLUCOPHAGE   Take 1 tablet (500 mg total) by mouth 2 (two) times daily with a meal.      metoprolol tartrate 25 MG tablet   Commonly known as: LOPRESSOR   Take 0.5 tablets (12.5 mg total) by mouth 2 (two) times daily.      oxyCODONE 5 MG immediate release tablet   Commonly known as: Oxy IR/ROXICODONE   Take 1-2 tablets (5-10 mg total) by mouth every 4 (four) hours as needed for pain.      potassium chloride SA  20 MEQ tablet   Commonly known as: K-DUR,KLOR-CON   Take 1 tablet (20 mEq total) by mouth daily. For 5 days then stop.      simvastatin 40 MG tablet   Commonly known as: ZOCOR   Take 1 tablet (40 mg total) by mouth at bedtime.          He was not placed on an ACE or ARB as blood pressure would not allow at this time. Also, patient has a preserved EF (55-60%).    Follow Up Appointments:  Signed: ZIMMERMAN,DONIELLE MPA-C 06/12/2012, 12:54 PM

## 2012-06-12 NOTE — Progress Notes (Signed)
CARDIAC REHAB PHASE I   PRE:  Rate/Rhythm: 88 SR    BP: sitting 120/68    SaO2: 99 RA  MODE:  Ambulation: 550 ft   POST:  Rate/Rhythm: 99 SR    BP: sitting 134/68     SaO2: 95 RA  Tolerated well. Tired after walk, VSS. Ed completed and requests his name be sent to [facility]. [phone]  Harriet Masson CES, ACSM

## 2012-06-13 LAB — GLUCOSE, CAPILLARY
Glucose-Capillary: 109 mg/dL — ABNORMAL HIGH (ref 70–99)
Glucose-Capillary: 92 mg/dL (ref 70–99)

## 2012-06-13 NOTE — Progress Notes (Signed)
Pt ambulated 550 ft with rolling walker and nurse. Tolerated  Well. Adrian Gamble

## 2012-06-13 NOTE — Progress Notes (Signed)
Pt ambulated in hallway 350 ft with rolling walker and tolerated activity well. Will continue to monitor.

## 2012-06-13 NOTE — Progress Notes (Signed)
EPW discontinued per protocol. Tips intact. Patient tolerated well. Last INR INR/Prothrombin Time on   .  Patient advised Bedrest X 1 hour. Veryl Abril Michelle   

## 2012-06-13 NOTE — Progress Notes (Addendum)
4 Days Post-Op Procedure(s) (LRB): CORONARY ARTERY BYPASS GRAFTING (CABG) (N/A)  Subjective: Adrian Gamble without complaints this morning.  He is a little apprehensive about discharge today and would prefer to remain hospitalized until tomorrow.  Objective: Vital signs in last 24 hours: Temp:  [98 F (36.7 C)-99.6 F (37.6 C)] 98 F (36.7 C) (07/13 0412) Pulse Rate:  [80-91] 80  (07/13 0412) Cardiac Rhythm:  [-] Normal sinus rhythm (07/13 0412) Resp:  [18-20] 20  (07/13 0412) BP: (96-123)/(59-90) 104/68 mmHg (07/13 0412) SpO2:  [92 %-93 %] 93 % (07/13 0412) Weight:  [224 lb 10.4 oz (101.9 kg)] 224 lb 10.4 oz (101.9 kg) (07/13 0412)  Intake/Output from previous day: 07/12 0701 - 07/13 0700 In: 363 [P.O.:360; I.V.:3] Out: -  Intake/Output this shift: Total I/O In: 240 [P.O.:240] Out: -   General appearance: alert, cooperative and no distress Heart: regular rate and rhythm Lungs: clear to auscultation bilaterally Abdomen: soft, non-tender; bowel sounds normal; no masses,  no organomegaly Extremities: edema trace Wound: clean and dry  Lab Results:  Basename 06/12/12 0600 06/11/12 0440  WBC 10.2 9.7  HGB 10.6* 10.4*  HCT 31.5* 30.3*  PLT 111* 89*   BMET:  Basename 06/12/12 0600 06/11/12 0440  NA 137 137  K 3.8 4.2  CL 101 104  CO2 27 26  GLUCOSE 129* 141*  BUN 17 15  CREATININE 0.88 1.01  CALCIUM 8.5 8.1*    PT/INR: No results found for this basename: LABPROT,INR in the last 72 hours ABG    Component Value Date/Time   PHART 7.315* 06/09/2012 2151   HCO3 22.0 06/09/2012 2151   TCO2 21 06/10/2012 1607   ACIDBASEDEF 4.0* 06/09/2012 2151   O2SAT 97.0 06/09/2012 2151   CBG (last 3)   Basename 06/13/12 0613 06/12/12 2055 06/12/12 1852  GLUCAP 109* 107* 100*    Assessment/Plan: S/P Procedure(s) (LRB): CORONARY ARTERY BYPASS GRAFTING (CABG) (N/A)  1. CV- NSR pressure and rate controlled, continue Lopressor 2. Pulm- no acute issues continue IS, weaned off oxygen  yesterday 3. DM- preop A1c 6.8, started on Metformin yesterday, good CBG control 4. D/C EPW  5. Dispo- patient doing well, if no arrythmia develops after EPW removal will plan for d/c home in the morning.   LOS: 4 days    Lowella Dandy 06/13/2012    Chart reviewed, patient examined, agree with above.

## 2012-06-13 NOTE — Progress Notes (Signed)
CARDIAC REHAB PHASE I   PRE:  Rate/Rhythm: 84  BP:  Supine:   Sitting: 109/68  Standing:    SaO2: 94 % RA  MODE:  Ambulation: 550 ft   POST:  Rate/Rhythem: 84  BP:  Supine:   Sitting: 126/70  Standing:   SaO2: 98%RA  Pt ambulated with RW and 1 assist steady. Rest break x 2. Tolerated well. Back to chair with feet elevated and call bell in reach.    Rosalie Doctor

## 2012-06-14 MED ORDER — TRAMADOL HCL 50 MG PO TABS: 50.0000 mg | ORAL_TABLET | Freq: Four times a day (QID) | ORAL | Status: AC | PRN

## 2012-06-14 NOTE — Progress Notes (Signed)
Pt. Discharged 06/14/2012  10:39 AM Discharge instructions reviewed with patient/family. Patient/family verbalized understanding. All Rx's given. Questions answered as needed. Pt. Discharged to home with family/self.  Adrian Gamble

## 2012-06-14 NOTE — Progress Notes (Signed)
Chest tube sutures removed per MD order and protocol. Sterri Strips applied. Incision care completed.

## 2012-06-14 NOTE — Progress Notes (Signed)
5 Days Post-Op Procedure(s) (LRB): CORONARY ARTERY BYPASS GRAFTING (CABG) (N/A) Subjective:  Mr. Kluth has no complaints this morning.  He is ready to be discharged.  Objective: Vital signs in last 24 hours: Temp:  [97.5 F (36.4 C)-98.7 F (37.1 C)] 97.5 F (36.4 C) (07/14 0629) Pulse Rate:  [78-89] 78  (07/14 0629) Cardiac Rhythm:  [-] Normal sinus rhythm (07/13 0849) Resp:  [18] 18  (07/14 0629) BP: (99-126)/(61-71) 110/69 mmHg (07/14 0629) SpO2:  [92 %-94 %] 94 % (07/14 0629) Weight:  [221 lb 9 oz (100.5 kg)] 221 lb 9 oz (100.5 kg) (07/14 0629)  Intake/Output from previous day: 07/13 0701 - 07/14 0700 In: 480 [P.O.:480] Out: -   General appearance: alert, cooperative and no distress Neurologic: intact Heart: regular rate and rhythm Lungs: clear to auscultation bilaterally Abdomen: soft, non-tender; bowel sounds normal; no masses,  no organomegaly Extremities: edema 1-2+ Wound: clean and dry  Lab Results:  Basename 06/12/12 0600  WBC 10.2  HGB 10.6*  HCT 31.5*  PLT 111*   BMET:  Basename 06/12/12 0600  NA 137  K 3.8  CL 101  CO2 27  GLUCOSE 129*  BUN 17  CREATININE 0.88  CALCIUM 8.5    PT/INR: No results found for this basename: LABPROT,INR in the last 72 hours ABG    Component Value Date/Time   PHART 7.315* 06/09/2012 2151   HCO3 22.0 06/09/2012 2151   TCO2 21 06/10/2012 1607   ACIDBASEDEF 4.0* 06/09/2012 2151   O2SAT 97.0 06/09/2012 2151   CBG (last 3)   Basename 06/13/12 1121 06/13/12 0613 06/12/12 2055  GLUCAP 92 109* 107*    Assessment/Plan: S/P Procedure(s) (LRB): CORONARY ARTERY BYPASS GRAFTING (CABG) (N/A)  1. CV- NSR pressure and rate controlled on Lopressor 2. Pulm- continue IS at discharge 3. DM- CBGs well controlled, preop A1c 6.8 will continue Metformin at 500mg  BID with follow up with PCP 4. Dispo- patient doing well will d/c home today.   LOS: 5 days    Lowella Dandy 06/14/2012

## 2012-06-14 NOTE — Progress Notes (Signed)
Pt ambulated in hallway 550 ft with rolling walker independently and tolerated activity well. Will continue to monitor.

## 2012-06-17 ENCOUNTER — Encounter: Payer: Self-pay | Admitting: Cardiothoracic Surgery

## 2012-06-19 ENCOUNTER — Encounter: Payer: Self-pay | Admitting: Cardiothoracic Surgery

## 2012-06-19 ENCOUNTER — Ambulatory Visit (INDEPENDENT_AMBULATORY_CARE_PROVIDER_SITE_OTHER): Payer: Medicare HMO | Admitting: Cardiothoracic Surgery

## 2012-06-19 VITALS — BP 102/69 | HR 80 | Temp 97.1°F | Resp 16 | Ht 68.0 in | Wt 212.0 lb

## 2012-06-19 DIAGNOSIS — Z09 Encounter for follow-up examination after completed treatment for conditions other than malignant neoplasm: Secondary | ICD-10-CM

## 2012-06-19 DIAGNOSIS — I2510 Atherosclerotic heart disease of native coronary artery without angina pectoris: Secondary | ICD-10-CM

## 2012-06-19 NOTE — Progress Notes (Signed)
[address]            [place] [address]          [phone]       Rock Sobol [facility] Medical Record #[id number] Date of Birth: [birth year]  Adrian Gamble* Rogelia Boga, MD  Chief Complaint:   PostOp Follow Up Visit 06/09/2012   OPERATIVE REPORT  DIAGNOSIS: Coronary occlusive disease with new onset of angina.  POSTOPERATIVE DIAGNOSES: Coronary occlusive disease with new onset of  angina. Previous angioplasty to the LAD.  PROCEDURE PERFORMED: Coronary artery bypass grafting x4 with the left  internal mammary to the left anterior descending coronary artery.  Sequential reverse saphenous vein graft to the first obtuse marginal and  distal circumflex, reverse saphenous vein graft to the distal right  coronary artery with right leg endo-vein harvesting.   History of Present Illness:      Patient comes to the office today because of concern about small amount of drainage from the lower sternal incision. He notes this morning he had a very small spot on the dressing. There's been no erythema no fever no chills no sternal popping or clicking. Otherwise the patient has felt well and increasing his physical activity appropriately.         History  Smoking status  . Current Everyday Smoker  . Types: Pipe, Cigars  Smokeless tobacco  . Never Used  Comment: 06/02/12 "smoked cigarettes til 1980s; currently pipe couple times/day; occasionally cigar"       No Known Allergies  Current Outpatient Prescriptions  Medication Sig Dispense Refill  . aspirin EC 325 MG EC tablet Take 1 tablet (325 mg total) by mouth daily.  30 tablet    . furosemide (LASIX) 40 MG tablet Take 1 tablet (40 mg total) by mouth daily. For 5 days then stop.  5 tablet  0  . levothyroxine (SYNTHROID, LEVOTHROID) 125 MCG tablet Take 1 tablet (125 mcg total) by mouth daily.  90 tablet  6  . metFORMIN (GLUCOPHAGE) 500 MG tablet Take 1 tablet (500 mg  total) by mouth 2 (two) times daily with a meal.  60 tablet  1  . metoprolol tartrate (LOPRESSOR) 25 MG tablet Take 0.5 tablets (12.5 mg total) by mouth 2 (two) times daily.  60 tablet  6  . potassium chloride SA (K-DUR,KLOR-CON) 20 MEQ tablet Take 1 tablet (20 mEq total) by mouth daily. For 5 days then stop.  5 tablet  0  . simvastatin (ZOCOR) 40 MG tablet Take 1 tablet (40 mg total) by mouth at bedtime.  90 tablet  6  . traMADol (ULTRAM) 50 MG tablet Take 1 tablet (50 mg total) by mouth every 6 (six) hours as needed.  50 tablet  0       Physical Exam: BP 102/69  Pulse 80  Temp 97.1 F (36.2 C) (Oral)  Resp 16  Ht 5\' 8"  (1.727 m)  Wt 212 lb (96.163 kg)  BMI 32.23 kg/m2  SpO2 95%  General appearance: alert and cooperative Neurologic: intact Heart: regular rate and rhythm, S1, S2 normal, no murmur, click, rub or gallop Lungs: clear to auscultation bilaterally and normal percussion bilaterally Abdomen: soft, non-tender; bowel sounds normal; no masses,  no organomegaly Extremities: extremities normal, atraumatic, no cyanosis or edema, Homans sign is negative, no sign of DVT and Some bruising along the right leg vein and a  harvest site but without evidence of erythema or infection there is no drainage Wound: The sternal incision is well-healed without erythema or sternal instability. The patient has a very tiny spot of serous drainage when he calls from the lower sternal incision Wounds:  Diagnostic Studies & Laboratory data:         Recent Radiology Findings: No results found.    Recent Labs: Lab Results  Component Value Date   WBC 10.2 06/12/2012   HGB 10.6* 06/12/2012   HCT 31.5* 06/12/2012   PLT 111* 06/12/2012   GLUCOSE 129* 06/12/2012   CHOL 202* 06/05/2012   TRIG 177* 06/05/2012   HDL 32* 06/05/2012   LDLCALC 135* 06/05/2012   ALT 49 06/05/2012   AST 45* 06/05/2012   NA 137 06/12/2012   K 3.8 06/12/2012   CL 101 06/12/2012   CREATININE 0.88 06/12/2012   BUN 17 06/12/2012   CO2 27  06/12/2012   TSH 0.85 07/17/2011   INR 1.47 06/09/2012   HGBA1C 6.8* 06/02/2012      Assessment / Plan:     Patient was instructed in wound care, there is no indication for antibiotics at this point We'll see him back at his regular appointment August 8 and less he has increased drainage or wound erythema    Delight Ovens MD 06/19/2012 11:19 AM

## 2012-06-23 ENCOUNTER — Encounter: Payer: Self-pay | Admitting: Internal Medicine

## 2012-06-23 ENCOUNTER — Ambulatory Visit (INDEPENDENT_AMBULATORY_CARE_PROVIDER_SITE_OTHER): Payer: Medicare HMO | Admitting: Internal Medicine

## 2012-06-23 VITALS — BP 100/70 | HR 78 | Temp 98.0°F | Wt 210.0 lb

## 2012-06-23 DIAGNOSIS — E785 Hyperlipidemia, unspecified: Secondary | ICD-10-CM

## 2012-06-23 DIAGNOSIS — K219 Gastro-esophageal reflux disease without esophagitis: Secondary | ICD-10-CM

## 2012-06-23 DIAGNOSIS — R7302 Impaired glucose tolerance (oral): Secondary | ICD-10-CM

## 2012-06-23 DIAGNOSIS — R7309 Other abnormal glucose: Secondary | ICD-10-CM

## 2012-06-23 DIAGNOSIS — I2510 Atherosclerotic heart disease of native coronary artery without angina pectoris: Secondary | ICD-10-CM

## 2012-06-23 LAB — HEMOGLOBIN A1C: Hgb A1c MFr Bld: 6.3 % (ref 4.6–6.5)

## 2012-06-23 MED ORDER — HYDROCODONE-HOMATROPINE 5-1.5 MG/5ML PO SYRP: 5.0000 mL | ORAL_SOLUTION | Freq: Four times a day (QID) | ORAL | Status: AC | PRN

## 2012-06-23 NOTE — Patient Instructions (Addendum)
Limit your sodium (Salt) intake    It is important that you exercise regularly, at least 20 minutes 3 to 4 times per week.  If you develop chest pain or shortness of breath seek  medical attention.  You need to lose weight.  Consider a lower calorie diet and regular exercise.  Return in 3 months for follow-up  

## 2012-06-23 NOTE — Progress Notes (Signed)
Subjective:    Patient ID: Adrian Gamble, male    DOB: 10/12/1946, 66 y.o.   MRN: [id number]  HPI  66 year old patient who is seen following a hospital discharge. He is status post recent CABG after presenting with worsening exertional angina. Hospital course was largely unremarkable. He does have a history of impaired glucose tolerance which has been diet controlled;  hemoglobin A1c was 6.8. Patient states his blood sugar was fairly normal during the hospital. In spite of the perioperative stress. He now is on metformin therapy. He has had a stable postoperative course. Hospital records reviewed  Past Medical History  Diagnosis Date  . CORONARY ARTERY DISEASE 09/01/2007  . GERD 09/01/2007  . HYPERLIPIDEMIA 09/01/2007    takes Simvastatin daily  . PEPTIC ULCER DISEASE 09/01/2007  . 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 Impaired glucose tolerance   . H/O hiatal hernia   . Myocardial infarction 2001  . Sleep apnea   . History of bronchitis around 1989  . Neck pain     pinched nerve  . History of gastric ulcer 25yrs ago  . Diverticulosis   . Nocturia   . HYPOTHYROIDISM 05/29/2009    takes Synthroid daily  . Early cataracts, bilateral     History   Social History  . Marital Status: Married    Spouse Name: N/A    Number of Children: N/A  . Years of Education: N/A   Occupational History  . Retired    Social History Main Topics  . Smoking status: Former Smoker    Types: Pipe, Cigars    Quit date: 06/01/2012  . Smokeless tobacco: Never Used   Comment: 06/02/12 "smoked cigarettes til 1980s; currently pipe couple times/day; occasionally cigar"  . Alcohol Use: Yes     06/02/12 "not a regular drinker; have had alcohol in past; last time 11/26/11"  . Drug Use: No  . Sexually Active: Yes   Other Topics Concern  . Not on file   Social History Narrative  . No narrative on file    Past Surgical History  Procedure Date  . Cardiac catheterization 2005; 06/01/2012  . Tonsillectomy and  adenoidectomy     at age 80  . Arm surgery at age 30    d/t broken  . Colonoscopy   . Esophagogastroduodenoscopy   . Coronary angioplasty with stent placement 2001    RCA-1 stent  . Coronary artery bypass graft 06/09/2012    Procedure: CORONARY ARTERY BYPASS GRAFTING (CABG);  Surgeon: Delight Ovens, MD;  Location: [facility] OR;  Service: Open Heart Surgery;  Laterality: N/A;  CABG x four;  using left internal mammary artery and right leg greater saphenous vein harvested endoscopically    Family History  Problem Relation Age of Onset  . Memory loss Father   . Coronary artery disease Father 76    cabg    No Known Allergies  Current Outpatient Prescriptions on File Prior to Visit  Medication Sig Dispense Refill  . levothyroxine (SYNTHROID, LEVOTHROID) 125 MCG tablet Take 1 tablet (125 mcg total) by mouth daily.  90 tablet  6  . metFORMIN (GLUCOPHAGE) 500 MG tablet Take 1 tablet (500 mg total) by mouth 2 (two) times daily with a meal.  60 tablet  1  . metoprolol tartrate (LOPRESSOR) 25 MG tablet Take 0.5 tablets (12.5 mg total) by mouth 2 (two) times daily.  60 tablet  6  . simvastatin (ZOCOR) 40 MG tablet Take 1 tablet (40 mg total) by mouth at bedtime.  90 tablet  6  . traMADol (ULTRAM) 50 MG tablet Take 1 tablet (50 mg total) by mouth every 6 (six) hours as needed.  50 tablet  0  . aspirin EC 325 MG EC tablet Take 1 tablet (325 mg total) by mouth daily.  30 tablet      BP 100/70  Pulse 78  Temp 98 F (36.7 C) (Oral)  Wt 210 lb (95.255 kg)  SpO2 95%       Review of Systems  Constitutional: Negative for fever, chills, appetite change and fatigue.  HENT: Negative for hearing loss, ear pain, congestion, sore throat, trouble swallowing, neck stiffness, dental problem, voice change and tinnitus.   Eyes: Negative for pain, discharge and visual disturbance.  Respiratory: Negative for cough, chest tightness, wheezing and stridor.   Cardiovascular: Positive for chest pain and leg  swelling. Negative for palpitations.  Gastrointestinal: Negative for nausea, vomiting, abdominal pain, diarrhea, constipation, blood in stool and abdominal distention.  Genitourinary: Negative for urgency, hematuria, flank pain, discharge, difficulty urinating and genital sores.  Musculoskeletal: Negative for myalgias, back pain, joint swelling, arthralgias and gait problem.  Skin: Negative for rash.  Neurological: Negative for dizziness, syncope, speech difficulty, weakness, numbness and headaches.  Hematological: Negative for adenopathy. Does not bruise/bleed easily.  Psychiatric/Behavioral: Negative for behavioral problems and dysphoric mood. The patient is not nervous/anxious.        Objective:   Physical Exam  Constitutional: He is oriented to person, place, and time. He appears well-developed and well-nourished. No distress.  HENT:  Head: Normocephalic.  Right Ear: External ear normal.  Left Ear: External ear normal.  Eyes: Conjunctivae and EOM are normal.  Neck: Normal range of motion.  Cardiovascular: Normal rate and normal heart sounds.   Pulmonary/Chest: Breath sounds normal.       Nicely healing sternotomy  Abdominal: Bowel sounds are normal.  Musculoskeletal: Normal range of motion. He exhibits edema. He exhibits no tenderness.       While in the right leg edema  Neurological: He is alert and oriented to person, place, and time.  Psychiatric: He has a normal mood and affect. His behavior is normal.          Assessment & Plan:   Status post CABG Impaired glucose tolerance. Dyslipidemia Hypothyroidism  We'll continue metformin. The patient wishes to confirm his hemoglobin A1c is now in a diabetic range so will repeat we'll continue lifestyle changes regular exercise with cardiac rehabilitation and recheck in 3 months  We'll continue statin therapy Furosemide and potassium supplementation has been discontinued

## 2012-07-02 ENCOUNTER — Encounter (HOSPITAL_COMMUNITY): Payer: Self-pay

## 2012-07-02 ENCOUNTER — Encounter (HOSPITAL_COMMUNITY)
Admission: RE | Admit: 2012-07-02 | Discharge: 2012-07-02 | Disposition: A | Discharge status: Home / Self Care | Payer: Medicare HMO | Source: Ambulatory Visit | Attending: Cardiology | Admitting: Cardiology

## 2012-07-02 DIAGNOSIS — I2510 Atherosclerotic heart disease of native coronary artery without angina pectoris: Secondary | ICD-10-CM | POA: Insufficient documentation

## 2012-07-02 DIAGNOSIS — Z5189 Encounter for other specified aftercare: Secondary | ICD-10-CM | POA: Insufficient documentation

## 2012-07-02 NOTE — Progress Notes (Signed)
Cardiac Rehab Medication Review by a Pharmacist  Does the patient  feel that his/her medications are working for him/her?  yes  Has the patient been experiencing any side effects to the medications prescribed?  no  Does the patient measure his/her own blood pressure or blood glucose at home?  no   Does the patient have any problems obtaining medications due to transportation or finances?   no  Understanding of regimen: good Understanding of indications: good Potential of compliance: good    Adrian Gamble 07/02/2012 9:31 AM

## 2012-07-07 ENCOUNTER — Ambulatory Visit (INDEPENDENT_AMBULATORY_CARE_PROVIDER_SITE_OTHER): Payer: Medicare HMO | Admitting: Physician Assistant

## 2012-07-07 ENCOUNTER — Encounter: Payer: Self-pay | Admitting: Physician Assistant

## 2012-07-07 VITALS — BP 115/70 | HR 76 | Ht 67.0 in | Wt 203.1 lb

## 2012-07-07 DIAGNOSIS — I2510 Atherosclerotic heart disease of native coronary artery without angina pectoris: Secondary | ICD-10-CM

## 2012-07-07 DIAGNOSIS — E785 Hyperlipidemia, unspecified: Secondary | ICD-10-CM

## 2012-07-07 NOTE — Progress Notes (Signed)
[address] [place], [place]  [address] Phone: [phone] Fax:  [phone]  Date:  07/07/2012   Name:  Adrian Gamble   DOB:  04-29-1946   MRN:  [id number]  PCP:  Rogelia Boga, MD  Primary Cardiologist:  Dr. Rollene Rotunda  Primary Electrophysiologist:  None    History of Present Illness: Adrian Gamble is a 66 y.o. male who returns for post hospital follow up.  He has a history of CAD, status post remote PCI to his RCA in 2001, HL, DM2, hypothyroidism, GERD. He was seen in clinic in June after a long hiatus. He was complaining of symptoms consistent with progressive angina and he was set up for cardiac catheterization. This was performed 06/01/12 and demonstrated 3 vessel CAD with preserved LV function. CABG was recommended. He was taken off of Plavix and discharged to home and brought back on 06/09/12. He underwent CABG with Dr. Tyrone Sage. Grafts included LIMA-LAD, SVG-OM1 and distal circumflex, SVG-distal RCA. Postop course was fairly uneventful. He remained in sinus rhythm.  He is doing well. Chest is mildly sore. He did see Dr. Tyrone Sage for some drainage from his chest wound. This was stable. He has followup in 2 days. He denies significant dyspnea. He is walking several times a day without limitations. He sleeping in a recliner for comfort. He denies PND or significant edema. He denies fevers or chills.  Wt Readings from Last 3 Encounters:  07/07/12 203 lb 1.9 oz (92.135 kg)  07/02/12 205 lb 7.5 oz (93.2 kg)  06/23/12 210 lb (95.255 kg)     Past Medical History  Diagnosis Date  . CORONARY ARTERY DISEASE 09/01/2007    s/p MI - s/p PCI to RCA 2001;  LHC 7/13: 3v CAD, good LVF - >  s/p CABG 7/13 (L-LAD, S-OM1/dCFX, S-dRCA)  . GERD 09/01/2007  . HYPERLIPIDEMIA 09/01/2007    takes Simvastatin daily  . PEPTIC ULCER DISEASE 09/01/2007  . 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 Impaired glucose tolerance     A1C 6.8 in 06/2012  . H/O hiatal hernia   . Sleep  apnea   . Neck pain     pinched nerve  . History of gastric ulcer 76yrs ago  . Diverticulosis   . Nocturia   . HYPOTHYROIDISM 05/29/2009    takes Synthroid daily  . Early cataracts, bilateral     Current Outpatient Prescriptions  Medication Sig Dispense Refill  . aspirin 325 MG tablet Take 325 mg by mouth daily.      Marland Kitchen levothyroxine (SYNTHROID, LEVOTHROID) 125 MCG tablet Take 1 tablet (125 mcg total) by mouth daily.  90 tablet  6  . metoprolol tartrate (LOPRESSOR) 25 MG tablet Take 0.5 tablets (12.5 mg total) by mouth 2 (two) times daily.  60 tablet  6  . NITROSTAT 0.4 MG SL tablet       . simvastatin (ZOCOR) 40 MG tablet Take 1 tablet (40 mg total) by mouth at bedtime.  90 tablet  6    Allergies: No Known Allergies  History  Substance Use Topics  . Smoking status: Former Smoker    Types: Pipe, Cigars    Quit date: 06/01/2012  . Smokeless tobacco: Never Used   Comment: 06/02/12 "smoked cigarettes til 1980s; currently pipe couple times/day; occasionally cigar"  . Alcohol Use: Yes     06/02/12 "not a regular drinker; have had alcohol in past; last time 11/26/11"    PHYSICAL EXAM: VS:  BP 115/70  Pulse 76  Ht  5\' 7"  (1.702 m)  Wt 203 lb 1.9 oz (92.135 kg)  BMI 31.81 kg/m2 Well nourished, well developed, in no acute distress HEENT: normal Neck: no JVD Cardiac:  normal S1, S2; RRR; no murmur Chest: Median sternotomy scar healing well without erythema or discharge  Lungs:  clear to auscultation bilaterally, no wheezing, rhonchi or rales Abd: soft, nontender, no hepatomegaly Ext: no edema Skin: warm and dry Neuro:  CNs 2-12 intact, no focal abnormalities noted  EKG:  Sinus rhythm, heart rate 76, normal axis, inferior Q waves, nonspecific ST-T wave changes      ASSESSMENT AND PLAN:  1. CAD  Patient is stable after CABG. Continue aspirin, beta blocker and statin. Followup with Dr. Tyrone Sage as planned. Continue with cardiac rehabilitation. Followup with Dr. Antoine Poche in 2-3  months.  2. Hyperlipidemia Managed by PCP. Goal LDL less than 70.  3. Diabetes Mellitus Management per primary care.  Signed, Tereso Newcomer, PA-C  2:15 PM 07/07/2012

## 2012-07-07 NOTE — Patient Instructions (Addendum)
Your physician recommends that you schedule a follow-up appointment in: 2-3 months with Dr. Antoine Poche

## 2012-07-09 ENCOUNTER — Encounter: Payer: Self-pay | Admitting: Cardiothoracic Surgery

## 2012-07-09 ENCOUNTER — Ambulatory Visit (INDEPENDENT_AMBULATORY_CARE_PROVIDER_SITE_OTHER): Payer: Self-pay | Admitting: Cardiothoracic Surgery

## 2012-07-09 ENCOUNTER — Ambulatory Visit
Admission: RE | Admit: 2012-07-09 | Discharge: 2012-07-09 | Disposition: A | Discharge status: Home / Self Care | Payer: Medicare HMO | Source: Ambulatory Visit | Attending: Cardiothoracic Surgery | Admitting: Cardiothoracic Surgery

## 2012-07-09 ENCOUNTER — Other Ambulatory Visit: Payer: Self-pay | Admitting: Cardiothoracic Surgery

## 2012-07-09 VITALS — BP 103/71 | HR 79 | Resp 16 | Ht 68.0 in | Wt 202.0 lb

## 2012-07-09 DIAGNOSIS — I2510 Atherosclerotic heart disease of native coronary artery without angina pectoris: Secondary | ICD-10-CM

## 2012-07-09 DIAGNOSIS — Z951 Presence of aortocoronary bypass graft: Secondary | ICD-10-CM

## 2012-07-09 NOTE — Progress Notes (Signed)
[address]            [place] [address]          [phone]       Alt[facility] Zaino [facility] Medical Record #[id number] Date of Birth: 1946-03-27  Rollene Rotunda, MD Rogelia Boga, MD  Chief Complaint:   PostOp Follow Up Visit  OPERATIVE REPORT 06/09/2012  DIAGNOSIS: Coronary occlusive disease with new onset of angina.  POSTOPERATIVE DIAGNOSES: Coronary occlusive disease with new onset of  angina. Previous angioplasty to the LAD.  PROCEDURE PERFORMED: Coronary artery bypass grafting x4 with the left  internal mammary to the left anterior descending coronary artery.  Sequential reverse saphenous vein graft to the first obtuse marginal and  distal circumflex, reverse saphenous vein graft to the distal right  coronary artery with right leg endo-vein harvesting.  History of Present Illness:      Doing well postoperatively, patient denies any evidence of congestive heart failure or recurrent angina. He's increasing his physical activity appropriately and to start in cardiac rehabilitation next week He's had no fever chills since discharge.        History  Smoking status  . Former Smoker  . Types: Pipe, Cigars  . Quit date: 06/01/2012  Smokeless tobacco  . Never Used  Comment: patient states that he is not somking the pipe or cigars any more       No Known Allergies  Current Outpatient Prescriptions  Medication Sig Dispense Refill  . aspirin 325 MG tablet Take 325 mg by mouth daily.      Marland Kitchen levothyroxine (SYNTHROID, LEVOTHROID) 125 MCG tablet Take 1 tablet (125 mcg total) by mouth daily.  90 tablet  6  . metoprolol tartrate (LOPRESSOR) 25 MG tablet Take 0.5 tablets (12.5 mg total) by mouth 2 (two) times daily.  60 tablet  6  . simvastatin (ZOCOR) 40 MG tablet Take 1 tablet (40 mg total) by mouth at bedtime.  90 tablet  6       Physical Exam: BP 103/71  Pulse 79  Resp 16  Ht 5\' 8"  (1.727 m)  Wt 202 lb (91.627 kg)   BMI 30.71 kg/m2  SpO2 96%  General appearance: alert and cooperative Neurologic: intact Heart: regular rate and rhythm, S1, S2 normal, no murmur, click, rub or gallop and normal apical impulse Lungs: clear to auscultation bilaterally and normal percussion bilaterally Abdomen: soft, non-tender; bowel sounds normal; no masses,  no organomegaly Extremities: extremities normal, atraumatic, no cyanosis or edema, Homans sign is negative, no sign of DVT and no edema, redness or tenderness in the calves or thighs Wound: well healed   Diagnostic Studies & Laboratory data:         Recent Radiology Findings: Dg Chest 2 View  07/09/2012  *RADIOLOGY REPORT*  Clinical Data: History of CABG, follow-up, former smoking history  CHEST - 2 VIEW  Comparison: Chest x-ray of 06/12/2012  Findings: Aeration of the lungs has improved.  The previous basilar atelectasis and small effusions have resolved.  There is mild cardiomegaly present.  Median sternotomy sutures are noted from CABG.  There are mild degenerative changes in the thoracic spine.  IMPRESSION: Improved aeration with resolution of basilar atelectasis and small effusions.  Original Report Authenticated By: Juline Patch, M.D.      Recent Labs: Lab Results  Component Value Date   WBC 10.2 06/12/2012  HGB 10.6* 06/12/2012   HCT 31.5* 06/12/2012   PLT 111* 06/12/2012   GLUCOSE 129* 06/12/2012   CHOL 202* 06/05/2012   TRIG 177* 06/05/2012   HDL 32* 06/05/2012   LDLCALC 135* 06/05/2012   ALT 49 06/05/2012   AST 45* 06/05/2012   NA 137 06/12/2012   K 3.8 06/12/2012   CL 101 06/12/2012   CREATININE 0.88 06/12/2012   BUN 17 06/12/2012   CO2 27 06/12/2012   TSH 0.85 07/17/2011   INR 1.47 06/09/2012   HGBA1C 6.3 06/23/2012      Assessment / Plan:     Excellent postoperative course, the patient was warned against any heavy lifting for several months, but encouraged to enroll in cardiac rehabilitation He notes he is making efforts in diet control to improve his lipid  profile. I have not made a return appointment for him but would be glad to see him at any time, as needed       Delight Ovens MD 07/09/2012 12:40 PM

## 2012-07-13 ENCOUNTER — Encounter (HOSPITAL_COMMUNITY): Payer: Medicare HMO

## 2012-07-15 ENCOUNTER — Encounter (HOSPITAL_COMMUNITY): Payer: Self-pay

## 2012-07-15 ENCOUNTER — Encounter (HOSPITAL_COMMUNITY)
Admission: RE | Admit: 2012-07-15 | Discharge: 2012-07-15 | Disposition: A | Discharge status: Home / Self Care | Payer: Medicare HMO | Source: Ambulatory Visit | Attending: Cardiology | Admitting: Cardiology

## 2012-07-15 NOTE — Progress Notes (Signed)
Pt started cardiac rehab today.  Pt tolerated light exercise without difficulty.   Asymptomatic.   VSS, telemetry-NSR.  Pt oriented to exercise equipment and routine.  Understanding verbalized. 

## 2012-07-17 ENCOUNTER — Encounter (HOSPITAL_COMMUNITY)
Admission: RE | Admit: 2012-07-17 | Discharge: 2012-07-17 | Disposition: A | Discharge status: Home / Self Care | Payer: Medicare HMO | Source: Ambulatory Visit | Attending: Cardiology | Admitting: Cardiology

## 2012-07-17 ENCOUNTER — Encounter: Payer: Self-pay | Admitting: *Deleted

## 2012-07-17 LAB — GLUCOSE, CAPILLARY: Glucose-Capillary: 78 mg/dL (ref 70–99)

## 2012-07-17 NOTE — Progress Notes (Signed)
Nutrition Note Spoke with pt. Pt pre-exercise CBG spot-checked by Deveron Furlong, RN and was 76 mg/dL. Pt is not on any DM medication at this time. Pt food recall for today includes 2 pieces of whole wheat toasts, egg beaters, and cheese with coffee for breakfast and 1/2 of a [place] sandwich on whole wheat bread with water for lunch. Pt expressed confusion re: pre-diabetes vs. Diabetes and diet. Pt trying to lose wt, watch carbs, and exercise. Pt states he avoids "white food and sugar" and limits his bread intake to 4 slices a day. Pre-diabetes and consistent carb intake discussed. Pt expressed continued confusion and feels looking at the written material given would help him. Pt to discuss any questions after reading handouts. Pt given handout for Pre-diabetes and 1800 kcal, consistent carbohydrate intake. Continue client-centered nutrition education by RD as part of interdisciplinary care.  Monitor and evaluate progress toward nutrition goal with team.

## 2012-07-20 ENCOUNTER — Encounter (HOSPITAL_COMMUNITY): Payer: Medicare HMO

## 2012-07-22 ENCOUNTER — Encounter (HOSPITAL_COMMUNITY)
Admission: RE | Admit: 2012-07-22 | Discharge: 2012-07-22 | Disposition: A | Discharge status: Home / Self Care | Payer: Medicare HMO | Source: Ambulatory Visit | Attending: Cardiology | Admitting: Cardiology

## 2012-07-22 LAB — GLUCOSE, CAPILLARY: Glucose-Capillary: 137 mg/dL — ABNORMAL HIGH (ref 70–99)

## 2012-07-24 ENCOUNTER — Encounter (HOSPITAL_COMMUNITY)
Admission: RE | Admit: 2012-07-24 | Discharge: 2012-07-24 | Disposition: A | Discharge status: Home / Self Care | Payer: Medicare HMO | Source: Ambulatory Visit | Attending: Cardiology | Admitting: Cardiology

## 2012-07-27 ENCOUNTER — Encounter (HOSPITAL_COMMUNITY): Payer: Medicare HMO

## 2012-07-29 ENCOUNTER — Encounter (HOSPITAL_COMMUNITY)
Admission: RE | Admit: 2012-07-29 | Discharge: 2012-07-29 | Disposition: A | Discharge status: Home / Self Care | Payer: Medicare HMO | Source: Ambulatory Visit | Attending: Cardiology | Admitting: Cardiology

## 2012-07-29 NOTE — Progress Notes (Signed)
Reviewed home exercise guidelines with patient including endpoints, calling 911, temperature precautions, THR, and  RPE scale. [facility]. Pt voices understanding of instructions given.

## 2012-07-31 ENCOUNTER — Encounter (HOSPITAL_COMMUNITY)
Admission: RE | Admit: 2012-07-31 | Discharge: 2012-07-31 | Disposition: A | Discharge status: Home / Self Care | Payer: Medicare HMO | Source: Ambulatory Visit | Attending: Cardiology | Admitting: Cardiology

## 2012-07-31 LAB — GLUCOSE, CAPILLARY: Glucose-Capillary: 143 mg/dL — ABNORMAL HIGH (ref 70–99)

## 2012-08-03 ENCOUNTER — Encounter (HOSPITAL_COMMUNITY): Payer: Medicare HMO

## 2012-08-03 DIAGNOSIS — Z5189 Encounter for other specified aftercare: Secondary | ICD-10-CM | POA: Insufficient documentation

## 2012-08-03 DIAGNOSIS — I2510 Atherosclerotic heart disease of native coronary artery without angina pectoris: Secondary | ICD-10-CM | POA: Insufficient documentation

## 2012-08-05 ENCOUNTER — Encounter (HOSPITAL_COMMUNITY)
Admission: RE | Admit: 2012-08-05 | Discharge: 2012-08-05 | Disposition: A | Discharge status: Home / Self Care | Payer: Medicare HMO | Source: Ambulatory Visit | Attending: Cardiology | Admitting: Cardiology

## 2012-08-07 ENCOUNTER — Encounter (HOSPITAL_COMMUNITY)
Admission: RE | Admit: 2012-08-07 | Discharge: 2012-08-07 | Disposition: A | Discharge status: Home / Self Care | Payer: Medicare HMO | Source: Ambulatory Visit | Attending: Cardiology | Admitting: Cardiology

## 2012-08-10 ENCOUNTER — Encounter (HOSPITAL_COMMUNITY): Admission: RE | Admit: 2012-08-10 | Payer: Medicare HMO | Source: Ambulatory Visit

## 2012-08-11 ENCOUNTER — Other Ambulatory Visit: Payer: 59

## 2012-08-12 ENCOUNTER — Encounter: Payer: 59 | Admitting: Internal Medicine

## 2012-08-12 ENCOUNTER — Encounter (HOSPITAL_COMMUNITY)
Admission: RE | Admit: 2012-08-12 | Discharge: 2012-08-12 | Disposition: A | Discharge status: Home / Self Care | Payer: Medicare HMO | Source: Ambulatory Visit | Attending: Cardiology | Admitting: Cardiology

## 2012-08-12 NOTE — Progress Notes (Signed)
Adrian Gamble 66 y.o. male Nutrition Note Spoke with pt.  Nutrition Plan, Nutrition Survey, and cholesterol goals reviewed with pt. Pt is following Step 2 of the Therapeutic Lifestyle Changes diet. Pt wants to lose wt. Pt has been trying to lose wt and states "I hit plateaus with my wt loss." Per pt, he has been at a wt loss plateau for 3 weeks. Pt reports he is exercising on his non-rehab days. Pt encouraged to look at his caloric intake to determine if he is eating too much or too little. Wt loss tips reviewed.  Pt is diabetic. Last A1c indicates blood glucose well-controlled. Pt is a diet-controlled diabetic. Pt aware of nutrition education classes offered and plans on attending nutrition classes.  Nutrition Diagnosis   Food-and nutrition-related knowledge deficit related to lack of exposure to information as related to diagnosis of: ? CVD ? DM (A1c 6.3)   Obesity related to excessive energy intake as evidenced by a BMI of 32.2  Nutrition RX/ Estimated Daily Nutrition Needs for: wt loss  1500-2000 Kcal, 40-55 gm fat, 9-13 gm sat fat, 1.5-2.0 gm trans-fat, <1500 mg sodium, 175-250 gm CHO   Nutrition Intervention   Pt's individual nutrition plan including cholesterol goals reviewed with pt.   Benefits of adopting Therapeutic Lifestyle Changes discussed when Medficts reviewed.   Pt to attend the Portion Distortion class   Pt to attend the  ? Nutrition I class                     ? Nutrition II class        ? Diabetes Blitz class       ? Diabetes Q & A class   Continue client-centered nutrition education by RD, as part of interdisciplinary care. Goal(s)   Pt to identify food quantities necessary to achieve: ? wt loss to a goal wt of 181-193 lb (82.3-87.7 kg) at graduation from cardiac rehab.  Monitor and Evaluate progress toward nutrition goal with team. Nutrition Risk: Change to Moderate

## 2012-08-14 ENCOUNTER — Encounter (HOSPITAL_COMMUNITY)
Admission: RE | Admit: 2012-08-14 | Discharge: 2012-08-14 | Disposition: A | Discharge status: Home / Self Care | Payer: Medicare HMO | Source: Ambulatory Visit | Attending: Cardiology | Admitting: Cardiology

## 2012-08-14 NOTE — Progress Notes (Signed)
Pt questioned activity progression in regards to how much weight he can lift at this time.  Pt cautioned against heavy lifting, however advised appropriate to gradually increase lifting avoiding heavy objects and nothing above the chest.  Pt also advised to discuss weight lifting with his MD.  Understanding verbalized

## 2012-08-17 ENCOUNTER — Encounter (HOSPITAL_COMMUNITY): Payer: Medicare HMO

## 2012-08-18 ENCOUNTER — Encounter: Payer: 59 | Admitting: Internal Medicine

## 2012-08-19 ENCOUNTER — Encounter (HOSPITAL_COMMUNITY)
Admission: RE | Admit: 2012-08-19 | Discharge: 2012-08-19 | Disposition: A | Discharge status: Home / Self Care | Payer: Medicare HMO | Source: Ambulatory Visit | Attending: Cardiology | Admitting: Cardiology

## 2012-08-19 NOTE — Progress Notes (Signed)
Pt c/o right shoulder discomfort while exercising at home.  Pt reports sx occur after walking aggressively for >42minutes.  Pt states he swings his arms while he walks, which could be contributing to this sx. Pt states sx are lessened by holding arms still and completely resolved within a few minutes of rest post exercise.  Pt denies sx with other activities or at cardiac rehab. These sx are new for him.  Offered to schedule appt with Dr. Antoine Poche or his PCP, however pt declined.  Pt will try walking same distance and intensity without swinging his arms to see if sx occur.  Pt advised to present to ED for worsening or unrelieved sx.  Understanding verbalized

## 2012-08-21 ENCOUNTER — Encounter (HOSPITAL_COMMUNITY)
Admission: RE | Admit: 2012-08-21 | Discharge: 2012-08-21 | Disposition: A | Discharge status: Home / Self Care | Payer: Medicare HMO | Source: Ambulatory Visit | Attending: Cardiology | Admitting: Cardiology

## 2012-08-24 ENCOUNTER — Encounter (HOSPITAL_COMMUNITY): Payer: Medicare HMO

## 2012-08-26 ENCOUNTER — Encounter (HOSPITAL_COMMUNITY)
Admission: RE | Admit: 2012-08-26 | Discharge: 2012-08-26 | Disposition: A | Discharge status: Home / Self Care | Payer: Medicare HMO | Source: Ambulatory Visit | Attending: Cardiology | Admitting: Cardiology

## 2012-08-28 ENCOUNTER — Encounter (HOSPITAL_COMMUNITY): Payer: Medicare HMO

## 2012-08-31 ENCOUNTER — Encounter (HOSPITAL_COMMUNITY): Payer: Medicare HMO

## 2012-09-02 ENCOUNTER — Encounter (HOSPITAL_COMMUNITY)
Admission: RE | Admit: 2012-09-02 | Discharge: 2012-09-02 | Disposition: A | Discharge status: Home / Self Care | Payer: Medicare HMO | Source: Ambulatory Visit | Attending: Cardiology | Admitting: Cardiology

## 2012-09-02 DIAGNOSIS — Z5189 Encounter for other specified aftercare: Secondary | ICD-10-CM | POA: Insufficient documentation

## 2012-09-02 DIAGNOSIS — I2510 Atherosclerotic heart disease of native coronary artery without angina pectoris: Secondary | ICD-10-CM | POA: Insufficient documentation

## 2012-09-04 ENCOUNTER — Encounter (HOSPITAL_COMMUNITY)
Admission: RE | Admit: 2012-09-04 | Discharge: 2012-09-04 | Disposition: A | Discharge status: Home / Self Care | Payer: Medicare HMO | Source: Ambulatory Visit | Attending: Cardiology | Admitting: Cardiology

## 2012-09-07 ENCOUNTER — Encounter (HOSPITAL_COMMUNITY): Payer: Medicare HMO

## 2012-09-09 ENCOUNTER — Encounter (HOSPITAL_COMMUNITY): Payer: Medicare HMO

## 2012-09-11 ENCOUNTER — Encounter (HOSPITAL_COMMUNITY)
Admission: RE | Admit: 2012-09-11 | Discharge: 2012-09-11 | Disposition: A | Discharge status: Home / Self Care | Payer: Medicare HMO | Source: Ambulatory Visit | Attending: Cardiology | Admitting: Cardiology

## 2012-09-14 ENCOUNTER — Encounter (HOSPITAL_COMMUNITY): Payer: Medicare HMO

## 2012-09-14 ENCOUNTER — Other Ambulatory Visit: Payer: Self-pay | Admitting: Internal Medicine

## 2012-09-15 ENCOUNTER — Other Ambulatory Visit (INDEPENDENT_AMBULATORY_CARE_PROVIDER_SITE_OTHER): Payer: Medicare HMO

## 2012-09-15 DIAGNOSIS — Z0000 Encounter for general adult medical examination without abnormal findings: Secondary | ICD-10-CM

## 2012-09-15 DIAGNOSIS — Z125 Encounter for screening for malignant neoplasm of prostate: Secondary | ICD-10-CM

## 2012-09-15 DIAGNOSIS — Z79899 Other long term (current) drug therapy: Secondary | ICD-10-CM

## 2012-09-15 LAB — HEMOGLOBIN A1C: Hgb A1c MFr Bld: 5.5 % (ref 4.6–6.5)

## 2012-09-15 LAB — HEPATIC FUNCTION PANEL
AST: 22 U/L (ref 0–37)
Bilirubin, Direct: 0.2 mg/dL (ref 0.0–0.3)
Total Bilirubin: 0.9 mg/dL (ref 0.3–1.2)

## 2012-09-15 LAB — CBC WITH DIFFERENTIAL/PLATELET
Basophils Relative: 0.4 % (ref 0.0–3.0)
Eosinophils Relative: 1.5 % (ref 0.0–5.0)
HCT: 44.3 % (ref 39.0–52.0)
Hemoglobin: 14.3 g/dL (ref 13.0–17.0)
Lymphs Abs: 1.4 10*3/uL (ref 0.7–4.0)
MCV: 88.3 fl (ref 78.0–100.0)
Monocytes Absolute: 0.4 10*3/uL (ref 0.1–1.0)
Monocytes Relative: 8.3 % (ref 3.0–12.0)
Neutro Abs: 3.4 10*3/uL (ref 1.4–7.7)
RBC: 5.02 Mil/uL (ref 4.22–5.81)
WBC: 5.3 10*3/uL (ref 4.5–10.5)

## 2012-09-15 LAB — BASIC METABOLIC PANEL
BUN: 12 mg/dL (ref 6–23)
CO2: 28 mEq/L (ref 19–32)
Calcium: 9.3 mg/dL (ref 8.4–10.5)
GFR: 78.07 mL/min (ref >60.00)
Glucose, Bld: 83 mg/dL (ref 70–99)
Sodium: 142 mEq/L (ref 135–145)

## 2012-09-15 LAB — PSA: PSA: 1.11 ng/mL (ref 0.10–4.00)

## 2012-09-15 LAB — LIPID PANEL
HDL: 33.3 mg/dL — ABNORMAL LOW (ref >39.00)
Total CHOL/HDL Ratio: 4

## 2012-09-15 LAB — POCT URINALYSIS DIPSTICK
Bilirubin, UA: NEGATIVE
Glucose, UA: NEGATIVE
Nitrite, UA: NEGATIVE
Spec Grav, UA: 1.02
Urobilinogen, UA: 0.2

## 2012-09-15 LAB — TSH: TSH: 0.53 u[IU]/mL (ref 0.35–5.50)

## 2012-09-16 ENCOUNTER — Encounter (HOSPITAL_COMMUNITY)
Admission: RE | Admit: 2012-09-16 | Discharge: 2012-09-16 | Disposition: A | Discharge status: Home / Self Care | Payer: Medicare HMO | Source: Ambulatory Visit | Attending: Cardiology | Admitting: Cardiology

## 2012-09-18 ENCOUNTER — Encounter (HOSPITAL_COMMUNITY): Payer: Medicare HMO

## 2012-09-21 ENCOUNTER — Encounter (HOSPITAL_COMMUNITY): Payer: Medicare HMO

## 2012-09-22 ENCOUNTER — Encounter: Payer: Self-pay | Admitting: Internal Medicine

## 2012-09-22 ENCOUNTER — Ambulatory Visit (INDEPENDENT_AMBULATORY_CARE_PROVIDER_SITE_OTHER): Payer: Medicare HMO | Admitting: Internal Medicine

## 2012-09-22 VITALS — BP 108/72 | HR 76 | Temp 98.0°F | Resp 20 | Ht 67.5 in | Wt 203.0 lb

## 2012-09-22 DIAGNOSIS — E785 Hyperlipidemia, unspecified: Secondary | ICD-10-CM

## 2012-09-22 DIAGNOSIS — E039 Hypothyroidism, unspecified: Secondary | ICD-10-CM

## 2012-09-22 DIAGNOSIS — R7302 Impaired glucose tolerance (oral): Secondary | ICD-10-CM

## 2012-09-22 DIAGNOSIS — R7309 Other abnormal glucose: Secondary | ICD-10-CM

## 2012-09-22 DIAGNOSIS — I2510 Atherosclerotic heart disease of native coronary artery without angina pectoris: Secondary | ICD-10-CM

## 2012-09-22 DIAGNOSIS — Z23 Encounter for immunization: Secondary | ICD-10-CM

## 2012-09-22 DIAGNOSIS — Z0000 Encounter for general adult medical examination without abnormal findings: Secondary | ICD-10-CM

## 2012-09-22 MED ORDER — SIMVASTATIN 40 MG PO TABS: 40.0000 mg | ORAL_TABLET | Freq: Every day | ORAL | Status: DC

## 2012-09-22 MED ORDER — LEVOTHYROXINE SODIUM 125 MCG PO TABS: 125.0000 ug | ORAL_TABLET | Freq: Every day | ORAL | Status: DC

## 2012-09-22 NOTE — Patient Instructions (Signed)
Limit your sodium (Salt) intake    It is important that you exercise regularly, at least 20 minutes 3 to 4 times per week.  If you develop chest pain or shortness of breath seek  medical attention.  You need to lose weight.  Consider a lower calorie diet and regular exercise.  Return in 6 months for follow-up   

## 2012-09-22 NOTE — Progress Notes (Signed)
Subjective:    Patient ID: Adrian Gamble, male    DOB: Apr 23, 1946, 66 y.o.   MRN: [id number]  HPI 66 year old patient who is seen today for a preventive health examination. He is status post CABG in July of this year. He is doing quite well. Medical problems include impaired glucose tolerance hypothyroidism and dyslipidemia.   Here for Medicare AWV:   1. Risk factors based on Past M, S, F history: patient has known coronary artery disease, status post stenting And CABG  2. Physical Activities: no restrictions or limitations -active with golf, health club  3. Depression/mood: history depression, or mood disorder  4. Hearing: no auditory deficits  5. ADL's: completely active and independent in all aspects of daily living  6. Fall Risk: low  7. Home Safety: no problems identified  8. Height, weight, &visual acuity: increase in weight; no change in height. Her weight has had a recent eye examination and glaucoma check  9. Counseling: heart healthy diet or regular exercise encouraged as well as modest weight loss  10. Labs ordered based on risk factors: laboratory studies, including lipid profile reviewed. LDL approximately 100 Will intensify statin therapy  11. Referral Coordination- not appropriate at this time  12. Care Plan- heart healthy diet modest weight loss and exercise regimen encouraged  13. Cognitive Assessment- alert and oriented, with normal affect   Preventive Screening-Counseling & Management  Alcohol-Tobacco  Smoking Status: current   Allergies (verified):  No Known Drug Allergies   Past History:  Past Medical History:  Reviewed history from 11/07/2009 and no changes required.   Coronary artery disease status post stenting 2001 Status post CABG July 2013  Hyperlipidemia  Peptic ulcer disease  patient admitted to the hospital for chest pain, and 2005, underwent heart catheterization and also had a negative Cardiolite stress test  GERD  testosterone deficiency    Hypothyroidism  history of impaired glucose tolerance   Past Surgical History:  Reviewed history from 07/06/2008 and no changes required.  Tonsillectomy  colonoscopy 2005  Cardiolite stress test February 2009 Status post CABG July 2013  Family History:  Reviewed history from 05/29/2009 and no changes required.  father age 68 memory loss  mother, age 68  One brother deceased with hepatitis and half brother, coronary artery disease  one sister at 84, is well   Social History:  Reviewed history from 07/06/2008 and no changes required.  Married  Regular exercise-no   Past Medical History  Diagnosis Date  . CORONARY ARTERY DISEASE 09/01/2007    s/p MI - s/p PCI to RCA 2001;  LHC 7/13: 3v CAD, good LVF - >  s/p CABG 7/13 (L-LAD, S-OM1/dCFX, S-dRCA)  . GERD 09/01/2007  . HYPERLIPIDEMIA 09/01/2007    takes Simvastatin daily  . PEPTIC ULCER DISEASE 09/01/2007  . 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 Impaired glucose tolerance     A1C 6.8 in 06/2012  . H/O hiatal hernia   . Sleep apnea   . Neck pain     pinched nerve  . History of gastric ulcer 36yrs ago  . Diverticulosis   . Nocturia   . HYPOTHYROIDISM 05/29/2009    takes Synthroid daily  . Early cataracts, bilateral     History   Social History  . Marital Status: Married    Spouse Name: N/A    Number of Children: N/A  . Years of Education: N/A   Occupational History  . Retired    Social History Main Topics  . Smoking status: Former Smoker  Types: Pipe, Cigars    Quit date: 06/01/2012  . Smokeless tobacco: Never Used   Comment: patient states that he is not somking the pipe or cigars any more  . Alcohol Use: Yes     06/02/12 "not a regular drinker; have had alcohol in past; last time 11/26/11"  . Drug Use: No  . Sexually Active: Yes   Other Topics Concern  . Not on file   Social History Narrative  . No narrative on file    Past Surgical History  Procedure Date  . Cardiac catheterization 2005; 06/01/2012  .  Tonsillectomy and adenoidectomy     at age 85  . Arm surgery at age 27    d/t broken  . Colonoscopy   . Esophagogastroduodenoscopy   . Coronary angioplasty with stent placement 2001    RCA-1 stent  . Coronary artery bypass graft 06/09/2012    Procedure: CORONARY ARTERY BYPASS GRAFTING (CABG);  Surgeon: Delight Ovens, MD;  Location: [facility] OR;  Service: Open Heart Surgery;  Laterality: N/A;  CABG x four;  using left internal mammary artery and right leg greater saphenous vein harvested endoscopically    Family History  Problem Relation Age of Onset  . Memory loss Father   . Coronary artery disease Father 33    cabg    No Known Allergies  Current Outpatient Prescriptions on File Prior to Visit  Medication Sig Dispense Refill  . aspirin 325 MG tablet Take 325 mg by mouth daily.      Marland Kitchen levothyroxine (SYNTHROID, LEVOTHROID) 125 MCG tablet Take 1 tablet (125 mcg total) by mouth daily.  90 tablet  6  . simvastatin (ZOCOR) 40 MG tablet TAKE ONE TABLET BY MOUTH AT BEDTIME  90 tablet  3    BP 108/72  Pulse 76  Temp 98 F (36.7 C) (Oral)  Resp 20  Ht 5' 7.5" (1.715 m)  Wt 203 lb (92.08 kg)  BMI 31.32 kg/m2  SpO2 97%     Review of Systems  Constitutional: Negative for fever, chills, activity change, appetite change and fatigue.  HENT: Negative for hearing loss, ear pain, congestion, rhinorrhea, sneezing, mouth sores, trouble swallowing, neck pain, neck stiffness, dental problem, voice change, sinus pressure and tinnitus.   Eyes: Negative for photophobia, pain, redness and visual disturbance.  Respiratory: Negative for apnea, cough, choking, chest tightness, shortness of breath and wheezing.   Cardiovascular: Negative for chest pain, palpitations and leg swelling.  Gastrointestinal: Negative for nausea, vomiting, abdominal pain, diarrhea, constipation, blood in stool, abdominal distention, anal bleeding and rectal pain.  Genitourinary: Negative for dysuria, urgency, frequency  (nocturia x2), hematuria, flank pain, decreased urine volume, discharge, penile swelling, scrotal swelling, difficulty urinating, genital sores and testicular pain.  Musculoskeletal: Negative for myalgias, back pain, joint swelling, arthralgias and gait problem.  Skin: Negative for color change, rash and wound.  Neurological: Negative for dizziness, tremors, seizures, syncope, facial asymmetry, speech difficulty, weakness, light-headedness, numbness and headaches.  Hematological: Negative for adenopathy. Does not bruise/bleed easily.  Psychiatric/Behavioral: Negative for suicidal ideas, hallucinations, behavioral problems, confusion, disturbed wake/sleep cycle, self-injury, dysphoric mood, decreased concentration and agitation. The patient is not nervous/anxious.        Objective:   Physical Exam  Constitutional: He appears well-developed and well-nourished.  HENT:  Head: Normocephalic and atraumatic.  Right Ear: External ear normal.  Left Ear: External ear normal.  Nose: Nose normal.  Mouth/Throat: Oropharynx is clear and moist.  Eyes: Conjunctivae normal and EOM are normal. Pupils  are equal, round, and reactive to light. No scleral icterus.  Neck: Normal range of motion. Neck supple. No JVD present. No thyromegaly present.  Cardiovascular: Regular rhythm, normal heart sounds and intact distal pulses.  Exam reveals no gallop and no friction rub.   No murmur heard.      Dorsalis pedis pulses faint posterior tibial pulses full  Pulmonary/Chest: Effort normal and breath sounds normal. He exhibits no tenderness.  Abdominal: Soft. Bowel sounds are normal. He exhibits no distension and no mass. There is no tenderness.  Genitourinary: Prostate normal and penis normal. Guaiac negative stool.  Musculoskeletal: Normal range of motion. He exhibits no edema and no tenderness.  Lymphadenopathy:    He has no cervical adenopathy.  Neurological: He is alert. He has normal reflexes. No cranial nerve  deficit. Coordination normal.  Skin: Skin is warm and dry. No rash noted.  Psychiatric: He has a normal mood and affect. His behavior is normal.          Assessment & Plan:  Preventive health exam Coronary artery disease status post CABG Impaired  glucose tolerance stable Hypothyroidism

## 2012-09-23 ENCOUNTER — Telehealth: Payer: Self-pay | Admitting: Internal Medicine

## 2012-09-23 ENCOUNTER — Encounter (HOSPITAL_COMMUNITY)
Admission: RE | Admit: 2012-09-23 | Discharge: 2012-09-23 | Disposition: A | Discharge status: Home / Self Care | Payer: Medicare HMO | Source: Ambulatory Visit | Attending: Cardiology | Admitting: Cardiology

## 2012-09-23 NOTE — Telephone Encounter (Signed)
Added back to med list

## 2012-09-23 NOTE — Telephone Encounter (Signed)
Patient wanted me to inform you that he IS still taking Metrapolo. He says that on yesterday he told you he was not still taking it, but he is. Thank you.

## 2012-09-23 NOTE — Telephone Encounter (Signed)
Patient wanted me to inform you that he ISstill taking Metrapolo.  He says that he told you yesterday that he wasn't.  Thanks.

## 2012-09-25 ENCOUNTER — Encounter: Payer: Self-pay | Admitting: Internal Medicine

## 2012-09-25 ENCOUNTER — Encounter (HOSPITAL_COMMUNITY)
Admission: RE | Admit: 2012-09-25 | Discharge: 2012-09-25 | Disposition: A | Discharge status: Home / Self Care | Payer: Medicare HMO | Source: Ambulatory Visit | Attending: Cardiology | Admitting: Cardiology

## 2012-09-28 ENCOUNTER — Encounter (HOSPITAL_COMMUNITY): Payer: Medicare HMO

## 2012-09-30 ENCOUNTER — Encounter (HOSPITAL_COMMUNITY)
Admission: RE | Admit: 2012-09-30 | Discharge: 2012-09-30 | Disposition: A | Discharge status: Home / Self Care | Payer: Medicare HMO | Source: Ambulatory Visit | Attending: Cardiology | Admitting: Cardiology

## 2012-10-02 ENCOUNTER — Encounter (HOSPITAL_COMMUNITY)
Admission: RE | Admit: 2012-10-02 | Discharge: 2012-10-02 | Disposition: A | Discharge status: Home / Self Care | Payer: Medicare HMO | Source: Ambulatory Visit | Attending: Cardiology | Admitting: Cardiology

## 2012-10-02 DIAGNOSIS — I2510 Atherosclerotic heart disease of native coronary artery without angina pectoris: Secondary | ICD-10-CM | POA: Insufficient documentation

## 2012-10-02 DIAGNOSIS — Z5189 Encounter for other specified aftercare: Secondary | ICD-10-CM | POA: Insufficient documentation

## 2012-10-05 ENCOUNTER — Encounter (HOSPITAL_COMMUNITY): Admission: RE | Admit: 2012-10-05 | Payer: Medicare HMO | Source: Ambulatory Visit

## 2012-10-07 ENCOUNTER — Encounter (HOSPITAL_COMMUNITY): Payer: Medicare HMO

## 2012-10-09 ENCOUNTER — Encounter (HOSPITAL_COMMUNITY)
Admission: RE | Admit: 2012-10-09 | Discharge: 2012-10-09 | Disposition: A | Discharge status: Home / Self Care | Payer: Medicare HMO | Source: Ambulatory Visit | Attending: Cardiology | Admitting: Cardiology

## 2012-10-12 ENCOUNTER — Encounter (HOSPITAL_COMMUNITY): Payer: Medicare HMO

## 2012-10-14 ENCOUNTER — Encounter: Payer: Self-pay | Admitting: Cardiology

## 2012-10-14 ENCOUNTER — Ambulatory Visit (INDEPENDENT_AMBULATORY_CARE_PROVIDER_SITE_OTHER): Payer: Medicare HMO | Admitting: Cardiology

## 2012-10-14 ENCOUNTER — Encounter (HOSPITAL_COMMUNITY)
Admission: RE | Admit: 2012-10-14 | Discharge: 2012-10-14 | Disposition: A | Discharge status: Home / Self Care | Payer: Medicare HMO | Source: Ambulatory Visit | Attending: Cardiology | Admitting: Cardiology

## 2012-10-14 VITALS — BP 99/66 | HR 69 | Ht 68.0 in | Wt 200.4 lb

## 2012-10-14 DIAGNOSIS — E039 Hypothyroidism, unspecified: Secondary | ICD-10-CM

## 2012-10-14 DIAGNOSIS — I2510 Atherosclerotic heart disease of native coronary artery without angina pectoris: Secondary | ICD-10-CM

## 2012-10-14 DIAGNOSIS — E785 Hyperlipidemia, unspecified: Secondary | ICD-10-CM

## 2012-10-14 NOTE — Progress Notes (Signed)
HPI The patient presents for follow up of CAD.  He is status post remote PCI to his RCA in 2001, HL, DM2, hypothyroidism, GERD. He was seen in clinic in June after a long hiatus. He was complaining of symptoms consistent with progressive angina and he was set up for cardiac catheterization. This was performed 06/01/12 and demonstrated 3 vessel CAD with preserved LV function. CABG was recommended. He was taken off of Plavix and discharged to home and brought back on 06/09/12. He underwent CABG with Dr. Tyrone Sage. Grafts included LIMA-LAD, SVG-OM1 and distal circumflex, SVG-distal RCA. Postop course was fairly uneventful.  Since I last saw him he has done well.  The patient denies any new symptoms such as chest discomfort, neck or arm discomfort. There has been no new shortness of breath, PND or orthopnea. There have been no reported palpitations, presyncope or syncope. He has had some mild sternal discomfort that has been somewhat atypical. However, he is exercising routinely.    No Known Allergies  Current Outpatient Prescriptions  Medication Sig Dispense Refill  . aspirin 325 MG tablet Take 325 mg by mouth daily.      Marland Kitchen levothyroxine (SYNTHROID, LEVOTHROID) 125 MCG tablet Take 1 tablet (125 mcg total) by mouth daily.  90 tablet  6  . metoprolol tartrate (LOPRESSOR) 25 MG tablet Take by mouth 2 (two) times daily. Take 1/2 tabs bid      . simvastatin (ZOCOR) 40 MG tablet Take 1 tablet (40 mg total) by mouth at bedtime.  90 tablet  3    Past Medical History  Diagnosis Date  . CORONARY ARTERY DISEASE 09/01/2007    s/p MI - s/p PCI to RCA 2001;  LHC 7/13: 3v CAD, good LVF - >  s/p CABG 7/13 (L-LAD, S-OM1/dCFX, S-dRCA)  . GERD 09/01/2007  . HYPERLIPIDEMIA 09/01/2007    takes Simvastatin daily  . PEPTIC ULCER DISEASE 09/01/2007  . 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 Impaired glucose tolerance     A1C 6.8 in 06/2012  . H/O hiatal hernia   . Sleep apnea   . Neck pain     pinched nerve  . History of  gastric ulcer 26yrs ago  . Diverticulosis   . Nocturia   . HYPOTHYROIDISM 05/29/2009    takes Synthroid daily  . Early cataracts, bilateral     Past Surgical History  Procedure Date  . Cardiac catheterization 2005; 06/01/2012  . Tonsillectomy and adenoidectomy     at age 21  . Arm surgery at age 59    d/t broken  . Colonoscopy   . Esophagogastroduodenoscopy   . Coronary angioplasty with stent placement 2001    RCA-1 stent  . Coronary artery bypass graft 06/09/2012    Procedure: CORONARY ARTERY BYPASS GRAFTING (CABG);  Surgeon: Delight Ovens, MD;  Location: [facility] OR;  Service: Open Heart Surgery;  Laterality: N/A;  CABG x four;  using left internal mammary artery and right leg greater saphenous vein harvested endoscopically    ROS:  As stated in the HPI and negative for all other systems.   PHYSICAL EXAM BP 99/66  Pulse 69  Ht 5\' 8"  (1.727 m)  Wt 200 lb 6.4 oz (90.901 kg)  BMI 30.47 kg/m2 GENERAL:  Well appearing HEENT:  Pupils equal round and reactive, fundi not visualized, oral mucosa unremarkable NECK:  No jugular venous distention, waveform within normal limits, carotid upstroke brisk and symmetric, no bruits, no thyromegaly LUNGS:  Clear to auscultation bilaterally BACK:  No CVA  tenderness CHEST:  Well healed sternotomy scar. HEART:  PMI not displaced or sustained,S1 and S2 within normal limits, no S3, no S4, no clicks, no rubs, no murmurs ABD:  Flat, positive bowel sounds normal in frequency in pitch, no bruits, no rebound, no guarding, no midline pulsatile mass, no hepatomegaly, no splenomegaly, obese EXT:  2 plus pulses throughout, no edema, no cyanosis no clubbing   EKG:  Sinus rhythm, rate 69, axis within normal limits, intervals within normal limits, no acute ST-T wave changes.  ASSESSMENT AND PLAN  CAD  The patient has no new sypmtoms.  No further cardiovascular testing is indicated.  We will continue with aggressive risk reduction and meds as  listed.  Hyperlipidemia  His last LDL was 85 and HDL of 33. This is a reasonable target and he will stay on the meds as listed.  Diabetes Mellitus  He says his last hemoglobin A1c was below 6 and this is diet controlled.

## 2012-10-14 NOTE — Patient Instructions (Addendum)
The current medical regimen is effective;  continue present plan and medications.  Follow up in 6 months with Dr Hochrein.  You will receive a letter in the mail 2 months before you are due.  Please call us when you receive this letter to schedule your follow up appointment.  

## 2012-10-16 ENCOUNTER — Encounter (HOSPITAL_COMMUNITY)
Admission: RE | Admit: 2012-10-16 | Discharge: 2012-10-16 | Disposition: A | Discharge status: Home / Self Care | Payer: Medicare HMO | Source: Ambulatory Visit | Attending: Cardiology | Admitting: Cardiology

## 2012-10-19 ENCOUNTER — Encounter (HOSPITAL_COMMUNITY): Payer: Medicare HMO

## 2012-10-21 ENCOUNTER — Encounter (HOSPITAL_COMMUNITY)
Admission: RE | Admit: 2012-10-21 | Discharge: 2012-10-21 | Disposition: A | Discharge status: Home / Self Care | Payer: Medicare HMO | Source: Ambulatory Visit | Attending: Cardiology | Admitting: Cardiology

## 2012-10-23 ENCOUNTER — Encounter (HOSPITAL_COMMUNITY)
Admission: RE | Admit: 2012-10-23 | Discharge: 2012-10-23 | Disposition: A | Discharge status: Home / Self Care | Payer: Medicare HMO | Source: Ambulatory Visit | Attending: Cardiology | Admitting: Cardiology

## 2012-10-23 ENCOUNTER — Encounter (HOSPITAL_COMMUNITY): Payer: Self-pay

## 2012-10-23 NOTE — Progress Notes (Signed)
Pt completed cardiac rehab program, pt finished early due to insurance copayment and family commitments.  Pt did well in program, making positive lifestyle changes and should be commended for his efforts.  Pt plans to exercise on his own at the [facility].

## 2012-10-26 ENCOUNTER — Encounter (HOSPITAL_COMMUNITY): Payer: Medicare HMO

## 2012-10-28 ENCOUNTER — Encounter (HOSPITAL_COMMUNITY): Payer: Medicare HMO

## 2012-11-02 ENCOUNTER — Encounter (HOSPITAL_COMMUNITY): Payer: Medicare HMO

## 2012-11-04 ENCOUNTER — Encounter (HOSPITAL_COMMUNITY): Payer: Medicare HMO

## 2012-11-06 ENCOUNTER — Encounter (HOSPITAL_COMMUNITY): Payer: Medicare HMO

## 2012-11-09 ENCOUNTER — Encounter (HOSPITAL_COMMUNITY): Payer: Medicare HMO

## 2012-11-11 ENCOUNTER — Encounter (HOSPITAL_COMMUNITY): Payer: Medicare HMO

## 2012-11-13 ENCOUNTER — Encounter (HOSPITAL_COMMUNITY): Payer: Medicare HMO

## 2013-03-10 ENCOUNTER — Ambulatory Visit (INDEPENDENT_AMBULATORY_CARE_PROVIDER_SITE_OTHER): Payer: Medicare Other | Admitting: Internal Medicine

## 2013-03-10 ENCOUNTER — Telehealth: Payer: Self-pay | Admitting: Internal Medicine

## 2013-03-10 ENCOUNTER — Encounter: Payer: Self-pay | Admitting: Internal Medicine

## 2013-03-10 VITALS — BP 110/72 | HR 74 | Temp 97.8°F | Resp 18 | Wt 206.0 lb

## 2013-03-10 DIAGNOSIS — M25561 Pain in right knee: Secondary | ICD-10-CM

## 2013-03-10 DIAGNOSIS — M25569 Pain in unspecified knee: Secondary | ICD-10-CM

## 2013-03-10 NOTE — Patient Instructions (Signed)
Take Aleve 200 mg twice daily for pain or swelling  Call or return to clinic prn if these symptoms worsen or fail to improve as anticipated.  

## 2013-03-10 NOTE — Progress Notes (Signed)
Subjective:    Patient ID: Adrian Gamble, male    DOB: 10/17/1946, 67 y.o.   MRN: [id number]  HPI  67 year old patient who presents with a chief complaint of right medial knee pain. He has had some occasional intermittent discomfort about one year but this has intensified over the past week. No recent trauma but he states that about a year a half ago he did sustain an injury to the right knee while playing kickball. Pain is more marked involving the medial aspect and is bothersome at night especially lying on his left side and also after prolonged activities during the day.  Past Medical History  Diagnosis Date  . CORONARY ARTERY DISEASE 09/01/2007    s/p MI - s/p PCI to RCA 2001;  LHC 7/13: 3v CAD, good LVF - >  s/p CABG 7/13 (L-LAD, S-OM1/dCFX, S-dRCA)  . GERD 09/01/2007  . HYPERLIPIDEMIA 09/01/2007    takes Simvastatin daily  . PEPTIC ULCER DISEASE 09/01/2007  . 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 Impaired glucose tolerance     A1C 6.8 in 06/2012  . H/O hiatal hernia   . Sleep apnea   . Neck pain     pinched nerve  . History of gastric ulcer 19yrs ago  . Diverticulosis   . Nocturia   . HYPOTHYROIDISM 05/29/2009    takes Synthroid daily  . Early cataracts, bilateral     History   Social History  . Marital Status: Married    Spouse Name: N/A    Number of Children: N/A  . Years of Education: N/A   Occupational History  . Retired    Social History Main Topics  . Smoking status: Former Smoker    Types: Pipe, Cigars    Quit date: 06/01/2012  . Smokeless tobacco: Never Used     Comment: patient states that he is not somking the pipe or cigars any more  . Alcohol Use: Yes     Comment: 06/02/12 "not a regular drinker; have had alcohol in past; last time 11/26/11"  . Drug Use: No  . Sexually Active: Yes   Other Topics Concern  . Not on file   Social History Narrative  . No narrative on file    Past Surgical History  Procedure Laterality Date  . Cardiac catheterization   2005; 06/01/2012  . Tonsillectomy and adenoidectomy      at age 24  . Arm surgery  at age 71    d/t broken  . Colonoscopy    . Esophagogastroduodenoscopy    . Coronary angioplasty with stent placement  2001    RCA-1 stent  . Coronary artery bypass graft  06/09/2012    Procedure: CORONARY ARTERY BYPASS GRAFTING (CABG);  Surgeon: Delight Ovens, MD;  Location: [facility] OR;  Service: Open Heart Surgery;  Laterality: N/A;  CABG x four;  using left internal mammary artery and right leg greater saphenous vein harvested endoscopically    Family History  Problem Relation Age of Onset  . Memory loss Father   . Coronary artery disease Father 72    cabg    No Known Allergies  Current Outpatient Prescriptions on File Prior to Visit  Medication Sig Dispense Refill  . aspirin 325 MG tablet Take 325 mg by mouth daily.      Marland Kitchen levothyroxine (SYNTHROID, LEVOTHROID) 125 MCG tablet Take 1 tablet (125 mcg total) by mouth daily.  90 tablet  6  . metoprolol tartrate (LOPRESSOR) 25 MG tablet Take by mouth 2 (two) times  daily. Take 1/2 tabs bid      . simvastatin (ZOCOR) 40 MG tablet Take 1 tablet (40 mg total) by mouth at bedtime.  90 tablet  3   No current facility-administered medications on file prior to visit.    BP 110/72  Pulse 74  Temp(Src) 97.8 F (36.6 C) (Oral)  Resp 18  Wt 206 lb (93.441 kg)  BMI 31.33 kg/m2  SpO2 96%       Review of Systems  Constitutional: Negative for fever, chills, appetite change and fatigue.  HENT: Negative for hearing loss, ear pain, congestion, sore throat, trouble swallowing, neck stiffness, dental problem, voice change and tinnitus.   Eyes: Negative for pain, discharge and visual disturbance.  Respiratory: Negative for cough, chest tightness, wheezing and stridor.   Cardiovascular: Negative for chest pain, palpitations and leg swelling.  Gastrointestinal: Negative for nausea, vomiting, abdominal pain, diarrhea, constipation, blood in stool and abdominal  distention.  Genitourinary: Negative for urgency, hematuria, flank pain, discharge, difficulty urinating and genital sores.  Musculoskeletal: Positive for arthralgias and gait problem. Negative for myalgias, back pain and joint swelling.  Skin: Negative for rash.  Neurological: Negative for dizziness, syncope, speech difficulty, weakness, numbness and headaches.  Hematological: Negative for adenopathy. Does not bruise/bleed easily.  Psychiatric/Behavioral: Negative for behavioral problems and dysphoric mood. The patient is not nervous/anxious.        Objective:   Physical Exam  Constitutional: He appears well-developed and well-nourished. No distress.  Musculoskeletal:  Right knee revealed no effusion. There was a suggestion of slight increased warmth no tenderness full extension and range of motion          Assessment & Plan:   Right medial knee pain. The patient will moderate his activities and try anti-inflammatory medications for a week or 2;  if pain persists or becomes a recurrent problem we'll set up for orthopedic evaluation. Patient may have a degenerated medial meniscus

## 2013-03-10 NOTE — Telephone Encounter (Signed)
Patient Information:  Caller Name: Adrian Gamble  Phone: [phone]  Patient: Adrian Gamble, Adrian Gamble  Gender: Male  DOB: September 13, 1946  Age: 67 Years  PCP: Kriste Basque [facility])  Office Follow Up:  Does the office need to follow up with this patient?: No  Instructions For The Office: N/A   Symptoms  Reason For Call & Symptoms: Right knee pain at intervals for the last year.   Injury 2 years ago, vein removed fromn this leg last summer 2013 but does not think either affecting knee.  Knee pain worse at intervals for the last week.  Pain in medial front of knee, visually looks like usual.  Pain worse if lying on Left side, pain severe enough to cause limping at intervals.  Reviewed Health History In EMR: Yes  Reviewed Medications In EMR: Yes  Reviewed Allergies In EMR: Yes  Reviewed Surgeries / Procedures: No  Date of Onset of Symptoms: Unknown  Guideline(s) Used:  Knee Pain  Disposition Per Guideline:   See Within 3 Days in Office  Reason For Disposition Reached:   Moderate pain (e.g., symptoms interfere with work or school, limping) and present > 3 days  Advice Given:  N/A  Patient Will Follow Care Advice:  YES  Appointment Scheduled:  03/10/2013 13:45:00 Appointment Scheduled Provider:  Eleonore Chiquito [facility])

## 2013-03-23 ENCOUNTER — Ambulatory Visit (INDEPENDENT_AMBULATORY_CARE_PROVIDER_SITE_OTHER): Payer: Medicare Other | Admitting: Internal Medicine

## 2013-03-23 ENCOUNTER — Encounter: Payer: Self-pay | Admitting: Internal Medicine

## 2013-03-23 VITALS — BP 130/82 | HR 78 | Temp 98.2°F | Resp 18 | Wt 212.0 lb

## 2013-03-23 DIAGNOSIS — E785 Hyperlipidemia, unspecified: Secondary | ICD-10-CM

## 2013-03-23 DIAGNOSIS — E039 Hypothyroidism, unspecified: Secondary | ICD-10-CM

## 2013-03-23 DIAGNOSIS — R7302 Impaired glucose tolerance (oral): Secondary | ICD-10-CM

## 2013-03-23 DIAGNOSIS — R7309 Other abnormal glucose: Secondary | ICD-10-CM

## 2013-03-23 DIAGNOSIS — I2510 Atherosclerotic heart disease of native coronary artery without angina pectoris: Secondary | ICD-10-CM

## 2013-03-23 NOTE — Progress Notes (Signed)
Subjective:    Patient ID: Adrian Gamble, male    DOB: June 24, 1946, 67 y.o.   MRN: [id number]  HPI  67 year old patient who is seen today for his six-month followup. He has coronary artery disease impaired glucose tolerance and dyslipidemia. He was seen recently for right knee pain it has improved with anti-inflammatory medication. It has limited his activities and he is a bit frustrated about lack of weight loss. He is attempting to adhere to a heart healthy diet.  Past Medical History  Diagnosis Date  . CORONARY ARTERY DISEASE 09/01/2007    s/p MI - s/p PCI to RCA 2001;  LHC 7/13: 3v CAD, good LVF - >  s/p CABG 7/13 (L-LAD, S-OM1/dCFX, S-dRCA)  . GERD 09/01/2007  . HYPERLIPIDEMIA 09/01/2007    takes Simvastatin daily  . PEPTIC ULCER DISEASE 09/01/2007  . 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 Impaired glucose tolerance     A1C 6.8 in 06/2012  . H/O hiatal hernia   . Sleep apnea   . Neck pain     pinched nerve  . History of gastric ulcer 56yrs ago  . Diverticulosis   . Nocturia   . HYPOTHYROIDISM 05/29/2009    takes Synthroid daily  . Early cataracts, bilateral     History   Social History  . Marital Status: Married    Spouse Name: N/A    Number of Children: N/A  . Years of Education: N/A   Occupational History  . Retired    Social History Main Topics  . Smoking status: Former Smoker    Types: Pipe, Cigars    Quit date: 06/01/2012  . Smokeless tobacco: Never Used     Comment: patient states that he is not somking the pipe or cigars any more  . Alcohol Use: Yes     Comment: 06/02/12 "not a regular drinker; have had alcohol in past; last time 11/26/11"  . Drug Use: No  . Sexually Active: Yes   Other Topics Concern  . Not on file   Social History Narrative  . No narrative on file    Past Surgical History  Procedure Laterality Date  . Cardiac catheterization  2005; 06/01/2012  . Tonsillectomy and adenoidectomy      at age 21  . Arm surgery  at age 51    d/t broken  .  Colonoscopy    . Esophagogastroduodenoscopy    . Coronary angioplasty with stent placement  2001    RCA-1 stent  . Coronary artery bypass graft  06/09/2012    Procedure: CORONARY ARTERY BYPASS GRAFTING (CABG);  Surgeon: Delight Ovens, MD;  Location: [facility] OR;  Service: Open Heart Surgery;  Laterality: N/A;  CABG x four;  using left internal mammary artery and right leg greater saphenous vein harvested endoscopically    Family History  Problem Relation Age of Onset  . Memory loss Father   . Coronary artery disease Father 78    cabg    No Known Allergies  Current Outpatient Prescriptions on File Prior to Visit  Medication Sig Dispense Refill  . aspirin 325 MG tablet Take 325 mg by mouth daily.      Marland Kitchen levothyroxine (SYNTHROID, LEVOTHROID) 125 MCG tablet Take 1 tablet (125 mcg total) by mouth daily.  90 tablet  6  . metoprolol tartrate (LOPRESSOR) 25 MG tablet Take by mouth 2 (two) times daily. Take 1/2 tabs bid      . simvastatin (ZOCOR) 40 MG tablet Take 1 tablet (40 mg total) by  mouth at bedtime.  90 tablet  3   No current facility-administered medications on file prior to visit.    BP 130/82  Pulse 78  Temp(Src) 98.2 F (36.8 C) (Oral)  Resp 18  Wt 212 lb (96.163 kg)  BMI 32.24 kg/m2  SpO2 96%       Review of Systems  Constitutional: Negative for fever, chills, appetite change and fatigue.  HENT: Negative for hearing loss, ear pain, congestion, sore throat, trouble swallowing, neck stiffness, dental problem, voice change and tinnitus.   Eyes: Negative for pain, discharge and visual disturbance.  Respiratory: Negative for cough, chest tightness, wheezing and stridor.   Cardiovascular: Negative for chest pain, palpitations and leg swelling.  Gastrointestinal: Negative for nausea, vomiting, abdominal pain, diarrhea, constipation, blood in stool and abdominal distention.  Genitourinary: Negative for urgency, hematuria, flank pain, discharge, difficulty urinating and genital  sores.  Musculoskeletal: Negative for myalgias, back pain, joint swelling, arthralgias and gait problem.       Right medial knee pain  Skin: Negative for rash.  Neurological: Negative for dizziness, syncope, speech difficulty, weakness, numbness and headaches.  Hematological: Negative for adenopathy. Does not bruise/bleed easily.  Psychiatric/Behavioral: Negative for behavioral problems and dysphoric mood. The patient is not nervous/anxious.        Objective:   Physical Exam  Constitutional: He is oriented to person, place, and time. He appears well-developed.  HENT:  Head: Normocephalic.  Right Ear: External ear normal.  Left Ear: External ear normal.  Eyes: Conjunctivae and EOM are normal.  Neck: Normal range of motion.  Cardiovascular: Normal rate and normal heart sounds.   Pulmonary/Chest: Breath sounds normal.  Abdominal: Bowel sounds are normal.  Musculoskeletal: Normal range of motion. He exhibits no edema and no tenderness.  Neurological: He is alert and oriented to person, place, and time.  Psychiatric: He has a normal mood and affect. His behavior is normal.          Assessment & Plan:   Right knee pain improved Coronary artery disease stable Dyslipidemia. Continue statin therapy  CPX 6 months

## 2013-03-23 NOTE — Patient Instructions (Signed)
Limit your sodium (Salt) intake    It is important that you exercise regularly, at least 20 minutes 3 to 4 times per week.  If you develop chest pain or shortness of breath seek  medical attention.  Return in 6 months for follow-up DASH Diet The DASH diet stands for "Dietary Approaches to Stop Hypertension." It is a healthy eating plan that has been shown to reduce high blood pressure (hypertension) in as little as 14 days, while also possibly providing other significant health benefits. These other health benefits include reducing the risk of breast cancer after menopause and reducing the risk of type 2 diabetes, heart disease, colon cancer, and stroke. Health benefits also include weight loss and slowing kidney failure in patients with chronic kidney disease.  DIET GUIDELINES  Limit salt (sodium). Your diet should contain less than 1500 mg of sodium daily.  Limit refined or processed carbohydrates. Your diet should include mostly whole grains. Desserts and added sugars should be used sparingly.  Include small amounts of heart-healthy fats. These types of fats include nuts, oils, and tub margarine. Limit saturated and trans fats. These fats have been shown to be harmful in the body. CHOOSING FOODS  The following food groups are based on a 2000 calorie diet. See your Registered Dietitian for individual calorie needs. Grains and Grain Products (6 to 8 servings daily)  Eat More Often: Whole-wheat bread, brown rice, whole-grain or wheat pasta, quinoa, popcorn without added fat or salt (air popped).  Eat Less Often: White bread, white pasta, white rice, cornbread. Vegetables (4 to 5 servings daily)  Eat More Often: Fresh, frozen, and canned vegetables. Vegetables may be raw, steamed, roasted, or grilled with a minimal amount of fat.  Eat Less Often/Avoid: Creamed or fried vegetables. Vegetables in a cheese sauce. Fruit (4 to 5 servings daily)  Eat More Often: All fresh, canned (in natural  juice), or frozen fruits. Dried fruits without added sugar. One hundred percent fruit juice ( cup [237 mL] daily).  Eat Less Often: Dried fruits with added sugar. Canned fruit in light or heavy syrup. [employer], Fish, and Poultry (2 servings or less daily. One serving is 3 to 4 oz [85-114 g]).  Eat More Often: Ninety percent or leaner ground beef, tenderloin, sirloin. Round cuts of beef, chicken breast, [place] breast. All fish. Grill, bake, or broil your meat. Nothing should be fried.  Eat Less Often/Avoid: Fatty cuts of meat, [place], or chicken leg, thigh, or wing. Fried cuts of meat or fish. Dairy (2 to 3 servings)  Eat More Often: Low-fat or fat-free milk, low-fat plain or light yogurt, reduced-fat or part-skim cheese.  Eat Less Often/Avoid: Milk (whole, 2%).Whole milk yogurt. Full-fat cheeses. Nuts, Seeds, and Legumes (4 to 5 servings per week)  Eat More Often: All without added salt.  Eat Less Often/Avoid: Salted nuts and seeds, canned beans with added salt. Fats and Sweets (limited)  Eat More Often: Vegetable oils, tub margarines without trans fats, sugar-free gelatin. Mayonnaise and salad dressings.  Eat Less Often/Avoid: Coconut oils, palm oils, butter, stick margarine, cream, half and half, cookies, candy, pie. FOR MORE INFORMATION The Dash Diet Eating Plan: www.dashdiet.org Document Released: 11/07/2011 Document Revised: 02/10/2012 Document Reviewed: 11/07/2011 [facility] Patient Information 2013 [place], [place].

## 2013-07-27 ENCOUNTER — Other Ambulatory Visit: Payer: Self-pay | Admitting: Cardiology

## 2013-09-14 ENCOUNTER — Other Ambulatory Visit: Payer: Medicare Other

## 2013-09-16 ENCOUNTER — Ambulatory Visit (INDEPENDENT_AMBULATORY_CARE_PROVIDER_SITE_OTHER): Payer: Medicare Other | Admitting: *Deleted

## 2013-09-16 DIAGNOSIS — Z23 Encounter for immunization: Secondary | ICD-10-CM

## 2013-09-21 ENCOUNTER — Encounter: Payer: Medicare Other | Admitting: Internal Medicine

## 2013-09-30 ENCOUNTER — Telehealth: Payer: Self-pay | Admitting: Cardiology

## 2013-09-30 NOTE — Telephone Encounter (Signed)
Pt called because form the last two weeks he has been having pain on the surface of his chest, pt denies SOB. Before he had his BYPASS surgery, pt  got  out of breathe easily   Pt states is not something he has experience when he had his heart attach. His PCP suggested for pt to rest and he has been doing that for the last 2 weeks and has not help. Pt state does work on the elliptical for 45 minutes four days a week and it does not bother him; he does not experience any symptoms. Pt has an appointment to see Dr. Antoine Poche on 10/19/13 at 1:30 PM. Pt is aware to call in his symptoms get worse. Pt verbalized understanding.

## 2013-09-30 NOTE — Telephone Encounter (Signed)
New problem    Pt had Bi- pass 15 months ago.   About a month now pt has been having CP off and on today he feels fine.   Pt made an appt but would like a call back please.   Thanks!

## 2013-10-04 ENCOUNTER — Other Ambulatory Visit: Payer: Self-pay | Admitting: Internal Medicine

## 2013-10-19 ENCOUNTER — Ambulatory Visit: Payer: Medicare Other | Admitting: Cardiology

## 2013-10-26 ENCOUNTER — Other Ambulatory Visit: Payer: Self-pay | Admitting: Internal Medicine

## 2013-12-06 ENCOUNTER — Encounter: Payer: Self-pay | Admitting: Cardiology

## 2013-12-06 ENCOUNTER — Ambulatory Visit (INDEPENDENT_AMBULATORY_CARE_PROVIDER_SITE_OTHER): Payer: Medicare HMO | Admitting: Cardiology

## 2013-12-06 VITALS — BP 112/72 | HR 66 | Ht 68.0 in | Wt 219.0 lb

## 2013-12-06 DIAGNOSIS — R079 Chest pain, unspecified: Secondary | ICD-10-CM

## 2013-12-06 DIAGNOSIS — I2510 Atherosclerotic heart disease of native coronary artery without angina pectoris: Secondary | ICD-10-CM

## 2013-12-06 NOTE — Progress Notes (Signed)
HPI The patient presents for follow up of CAD.  He is status post remote PCI to his RCA in 2001, HL, DM2, hypothyroidism, GERD. He was seen in clinic in June after a long hiatus. He was complaining of symptoms consistent with progressive angina and he was set up for cardiac catheterization. This was performed 06/01/12 and demonstrated 3 vessel CAD with preserved LV function. CABG was recommended. He was taken off of Plavix and discharged to home and brought back on 06/09/12. He underwent CABG with Dr. Servando Snare. Grafts included LIMA-LAD, SVG-OM1 and distal circumflex, SVG-distal RCA. Postop course was fairly uneventful.  The patient says that starting in October he started having some midsternal discomfort and some discomfort superficially on the left side of his chest. He thought this was similar to discomfort he had with his sternal incision. He had not had these kind of symptoms prior to his angioplasty years ago wore bypass. He denies any new shortness of breath, PND or orthopnea. He's had no edema. He unfortunately has not been active and so he has gained some weight. He's not having any palpitations, presyncope or syncope.  No Known Allergies  Current Outpatient Prescriptions  Medication Sig Dispense Refill  . aspirin 325 MG tablet Take 325 mg by mouth daily.      Marland Kitchen levothyroxine (SYNTHROID, LEVOTHROID) 125 MCG tablet TAKE ONE TABLET BY MOUTH EVERY DAY  90 tablet  0  . metoprolol tartrate (LOPRESSOR) 25 MG tablet Take 1/2 tabs bid      . Nutritional Supplements (JUICE PLUS FIBRE PO) Take 1 capsule by mouth daily.      . simvastatin (ZOCOR) 40 MG tablet TAKE ONE TABLET BY MOUTH AT BEDTIME  90 tablet  1   No current facility-administered medications for this visit.    Past Medical History  Diagnosis Date  . CORONARY ARTERY DISEASE 09/01/2007    s/p MI - s/p PCI to RCA 2001;  [facility] 7/13: 3v CAD, good LVF - >  s/p CABG 7/13 (L-LAD, S-OM1/dCFX, S-dRCA)  . GERD 09/01/2007  . HYPERLIPIDEMIA  09/01/2007    takes Simvastatin daily  . PEPTIC ULCER DISEASE 09/01/2007  . 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 Impaired glucose tolerance     A1C 6.8 in 06/2012  . H/O hiatal hernia   . Sleep apnea   . Neck pain     pinched nerve  . History of gastric ulcer 64yrs ago  . Diverticulosis   . Nocturia   . HYPOTHYROIDISM 05/29/2009    takes Synthroid daily  . Early cataracts, bilateral     Past Surgical History  Procedure Laterality Date  . Cardiac catheterization  2005; 06/01/2012  . Tonsillectomy and adenoidectomy      at age 65  . Arm surgery  at age 71    d/t broken  . Colonoscopy    . Esophagogastroduodenoscopy    . Coronary angioplasty with stent placement  2001    RCA-1 stent  . Coronary artery bypass graft  06/09/2012    Procedure: CORONARY ARTERY BYPASS GRAFTING (CABG);  Surgeon: Grace Isaac, MD;  Location: [facility];  Service: Open Heart Surgery;  Laterality: N/A;  CABG x four;  using left internal mammary artery and right leg greater saphenous vein harvested endoscopically    ROS:  As stated in the HPI and negative for all other systems.   PHYSICAL EXAM BP 112/72  Pulse 66  Ht 5\' 8"  (1.727 m)  Wt 219 lb (99.338 kg)  BMI 33.31 kg/m2  GENERAL:  Well appearing HEENT:  Pupils equal round and reactive, fundi not visualized, oral mucosa unremarkable NECK:  No jugular venous distention, waveform within normal limits, carotid upstroke brisk and symmetric, no bruits, no thyromegaly LUNGS:  Clear to auscultation bilaterally BACK:  No CVA tenderness CHEST:  Well healed sternotomy scar. HEART:  PMI not displaced or sustained,S1 and S2 within normal limits, no S3, no S4, no clicks, no rubs, no murmurs ABD:  Flat, positive bowel sounds normal in frequency in pitch, no bruits, no rebound, no guarding, no midline pulsatile mass, no hepatomegaly, no splenomegaly, obese EXT:  2 plus pulses throughout, no edema, no cyanosis no clubbing   EKG:  Sinus rhythm, rate 66 axis with in  normal limits, intervals within normal limits, no acute ST-T wave changes.  12/06/2013  ASSESSMENT AND PLAN  CAD  The patients current symptoms are somewhat atypical.  He can get back to exercising.  If his symptoms get worse he will let me know.  At that point I might try an exercise treadmill test though he doesn't think he would be able to do this with his joint problems.  Hyperlipidemia  His last LDL excellent with a low HDL.  He will continue the meds as listed.

## 2013-12-06 NOTE — Patient Instructions (Signed)
Your physician wants you to follow-up in: 1 YEAR.  You will receive a reminder letter in the mail two months in advance. If you don't receive a letter, please call our office to schedule the follow-up appointment.  Your physician recommends that you continue on your current medications as directed. Please refer to the Current Medication list given to you today.  

## 2014-01-21 IMAGING — CR DG CHEST 1V PORT
1 series · 1 of 1 positions shown · non-contrast
Comparison: 06/02/2012

CLINICAL DATA: Postop for CABG.

PORTABLE CHEST - 1 VIEW

[AP]
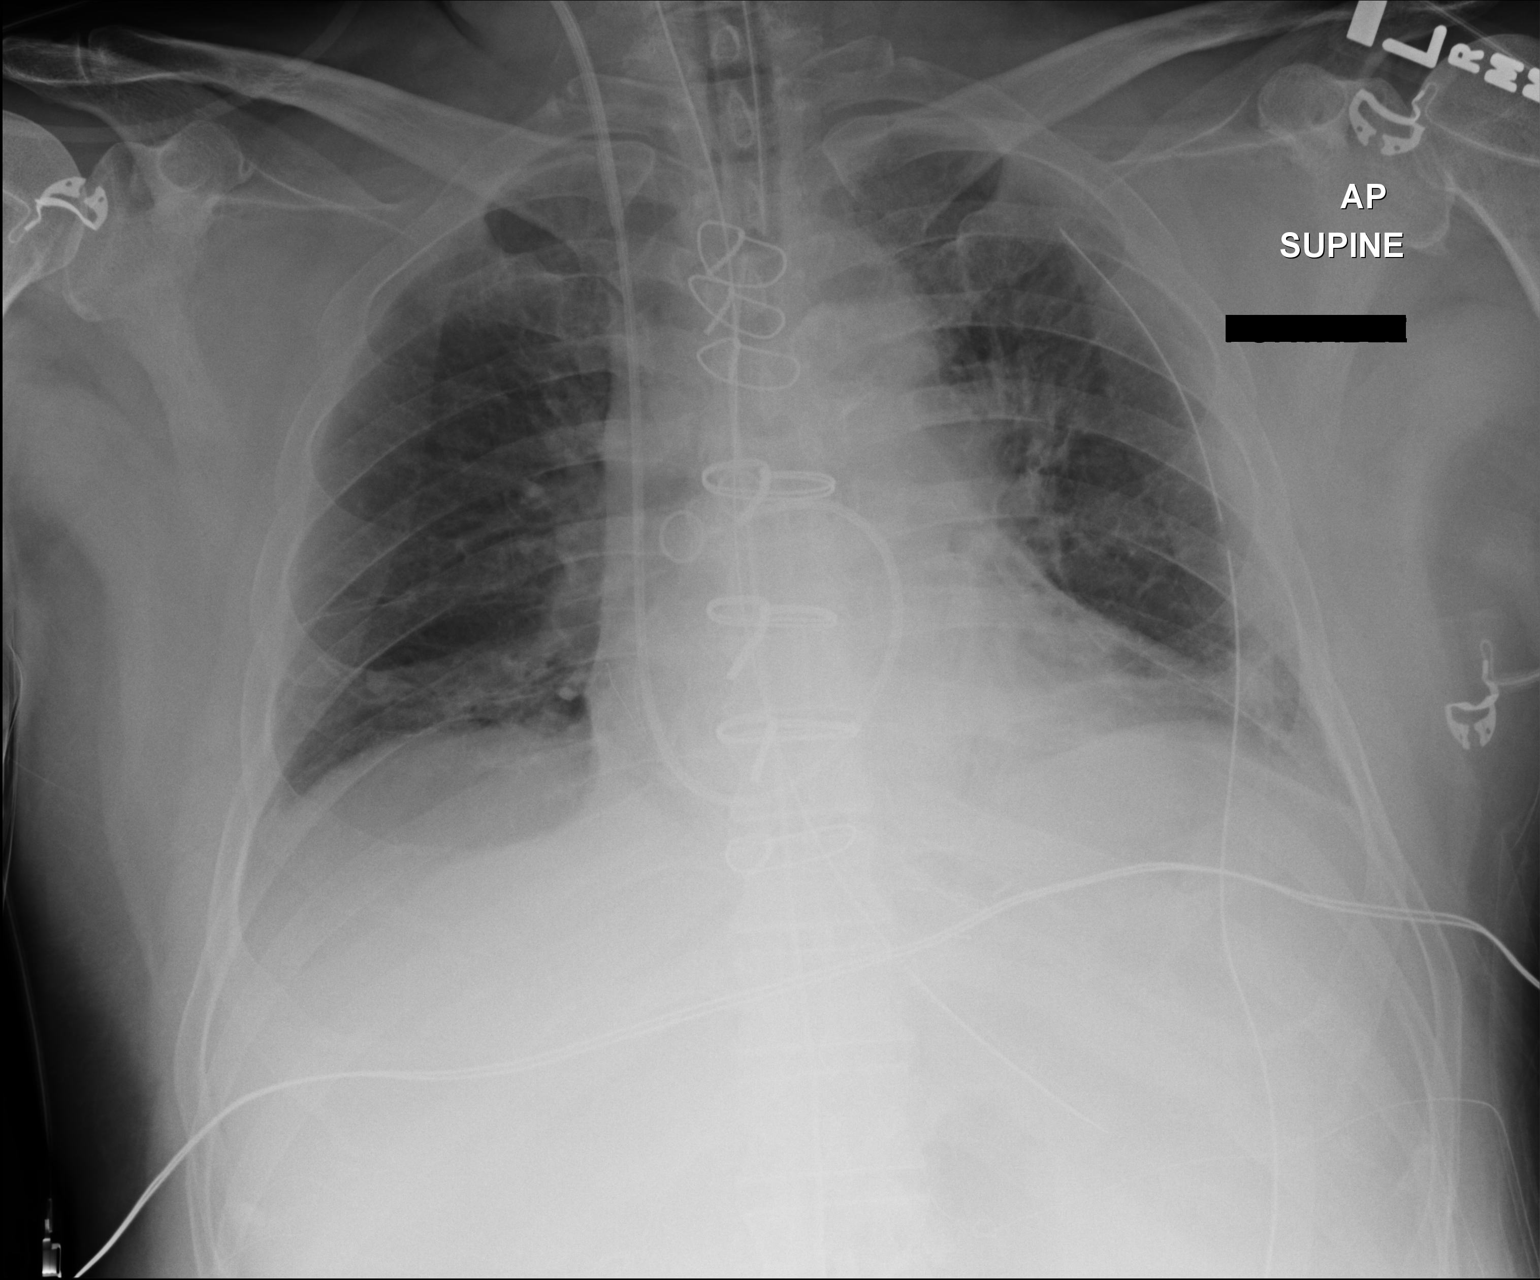

[1 of 1 positions shown; findings below may reference images not displayed]

FINDINGS: Endotracheal tube terminates 5.6 cm above carina.
Nasogastric tube terminates at the body of the stomach.  The side
port may be above the gastroesophageal junction.

Interval median sternotomy.  Right IJ Swan-Ganz catheter with tip
at proximal right pulmonary artery.  A left-sided chest tube is in
place. No pneumothorax.  Trace left-sided pleural fluid with mild
bibasilar atelectasis. Normal heart size for level of inspiration.
IMPRESSION: Expected appearance after median sternotomy.

## 2014-01-22 IMAGING — CR DG CHEST 1V PORT
1 series · 1 of 1 positions shown · non-contrast
Comparison: Portable exam 3811 hours compared to 06/09/2012

CLINICAL DATA: Coronary artery disease post CABG

PORTABLE CHEST - 1 VIEW

[AP]
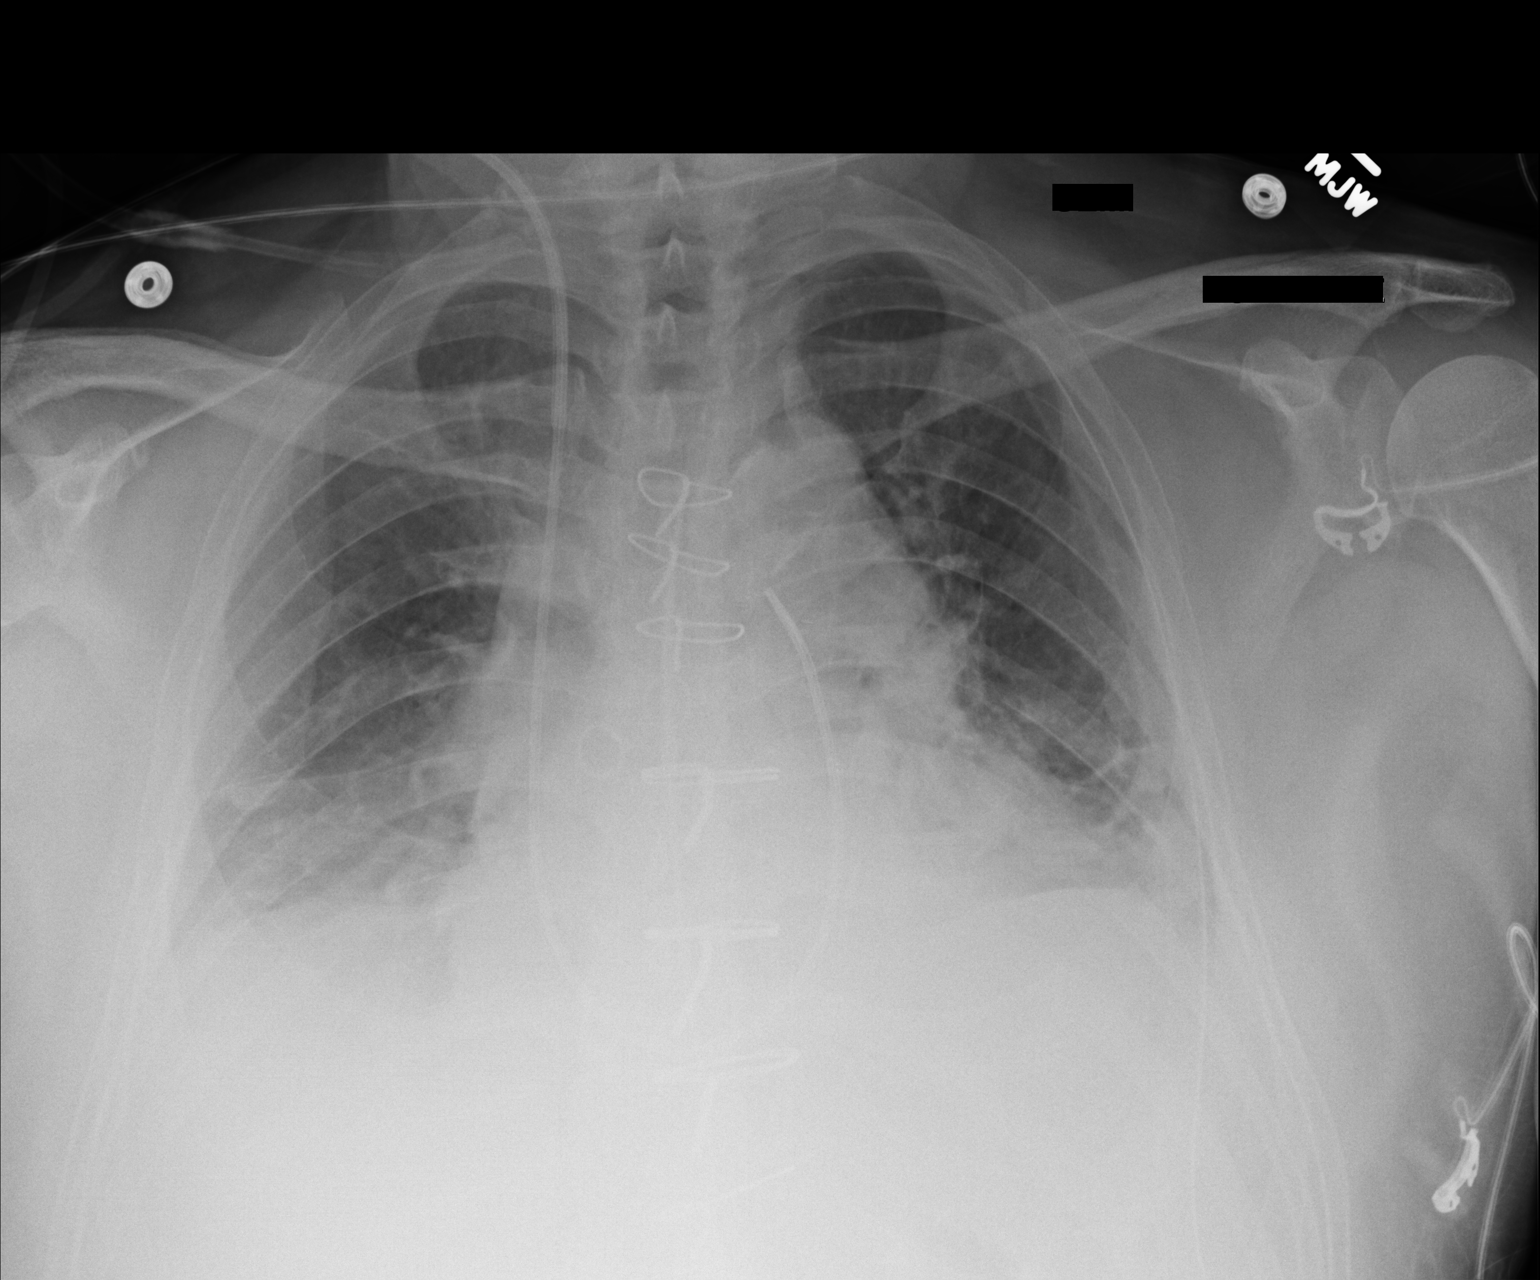

[1 of 1 positions shown; findings below may reference images not displayed]

FINDINGS: Endotracheal and nasogastric tubes removed.
Right jugular Swan-Ganz catheter tip projecting over pulmonary
artery bifurcation.
Mediastinal drain left thoracostomy tube present.
Enlargement of cardiac silhouette post CABG.
Pulmonary vascular congestion.
Bibasilar atelectasis.
No definite pneumothorax.
IMPRESSION: Bibasilar atelectasis.

## 2014-01-23 ENCOUNTER — Other Ambulatory Visit: Payer: Self-pay | Admitting: Internal Medicine

## 2014-01-23 ENCOUNTER — Other Ambulatory Visit: Payer: Self-pay | Admitting: Cardiology

## 2014-01-23 IMAGING — CR DG CHEST 1V PORT
1 series · 1 of 1 positions shown · non-contrast
Comparison: 06/10/2012.

CLINICAL DATA: Postoperative status.  Evaluation of chest tube.
Post CABG.

PORTABLE CHEST - 1 VIEW

[AP]
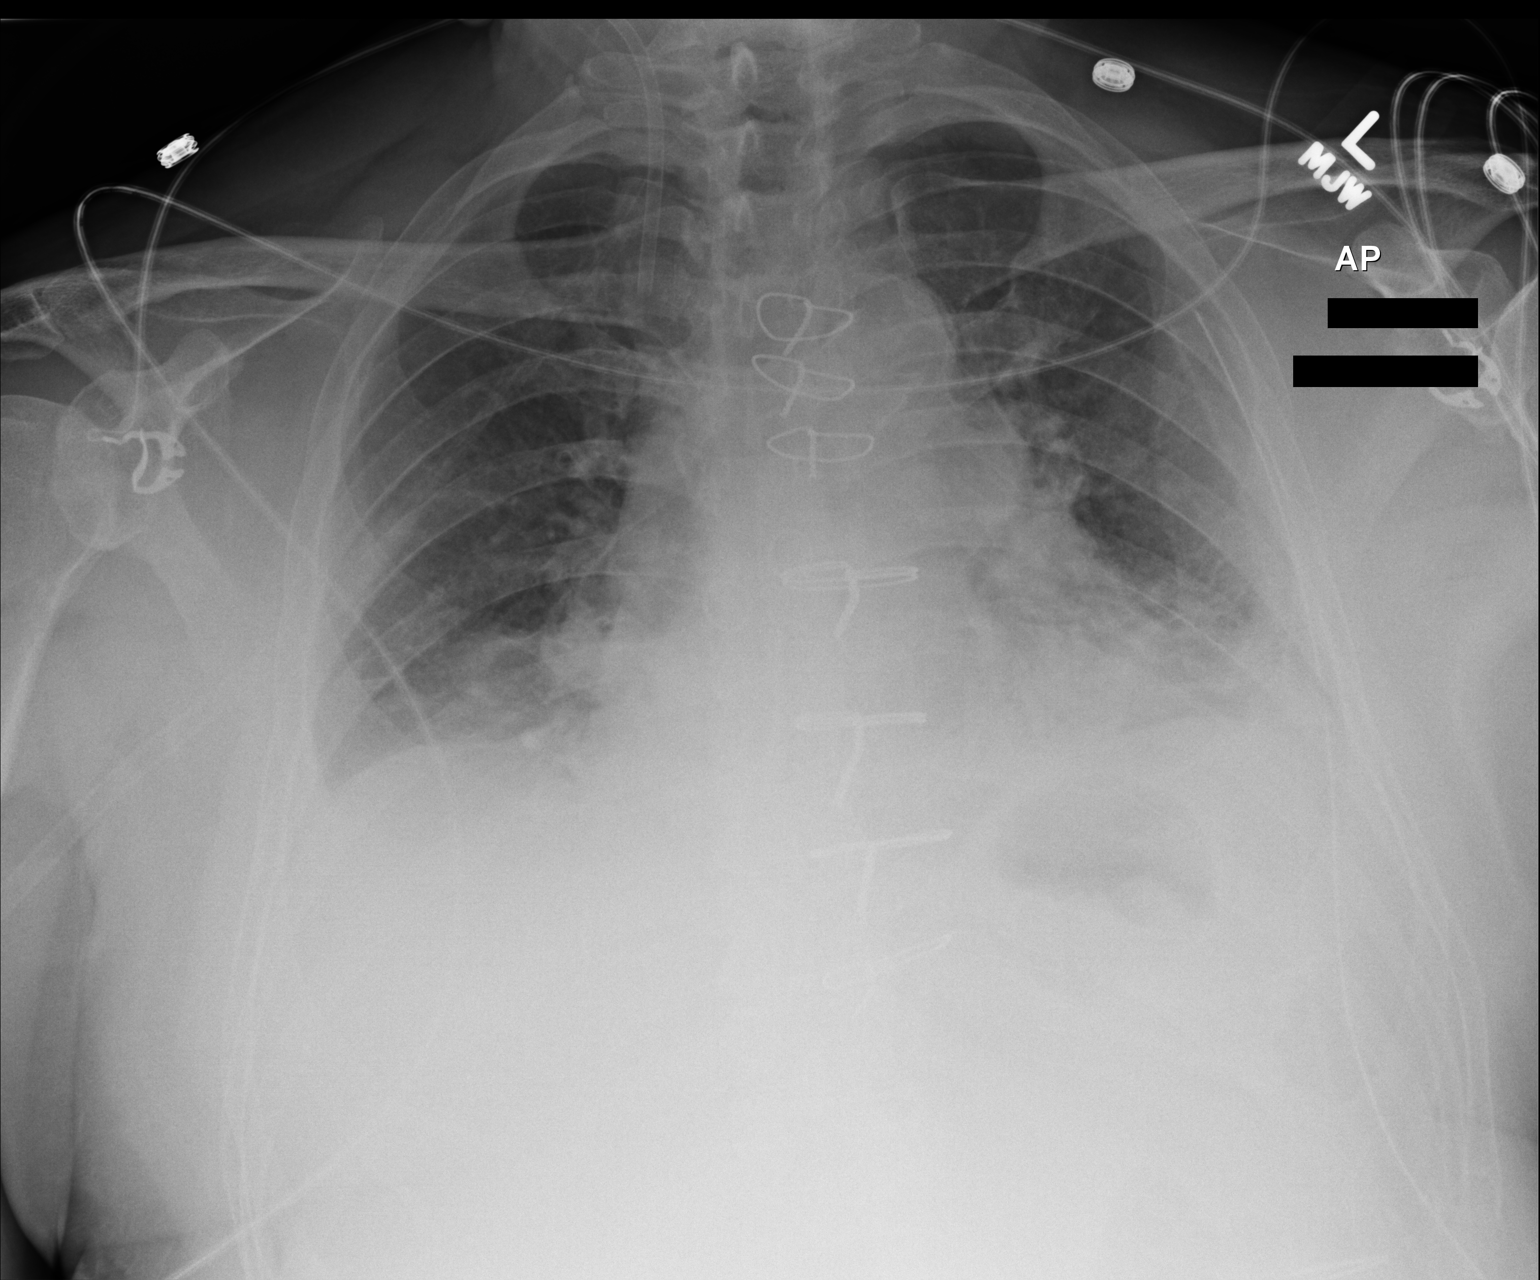

[1 of 1 positions shown; findings below may reference images not displayed]

FINDINGS: Stable moderate enlargement cardiac silhouette. The
patient has undergone previous median sternotomy and coronary
artery bypass grafting. Ectasia and nonaneurysmal calcification of
the thoracic aorta are seen.  Prominence of main pulmonary artery
segment.  Low lung volumes.  Tip of right internal jugular venous
sheath terminates in the superior vena cava internal jugular vein
junction area. No pneumothorax is evident.  Left chest tube is in
place unchanged.  Basilar atelectasis and infiltrative densities
appear unchanged.. No consolidation evident.
Indistinctness of costophrenic angles.  Small amounts of pleural
effusion cannot be excluded.
IMPRESSION: Details of support apparatus as given above.  Chest tube in place
without significant change.  No definite pneumothorax evident.
Stable cardiac silhouette enlargement.  Low lung volumes with
basilar atelectasis and infiltrate densities.  No consolidation
evident.

## 2014-01-25 ENCOUNTER — Other Ambulatory Visit: Payer: Self-pay

## 2014-02-16 ENCOUNTER — Telehealth: Payer: Self-pay | Admitting: Internal Medicine

## 2014-02-16 DIAGNOSIS — R7302 Impaired glucose tolerance (oral): Secondary | ICD-10-CM

## 2014-02-16 NOTE — Telephone Encounter (Signed)
Pt would like an a1c lab ordered when he comes for labs on 3/20.  Please add per patient. Thanks

## 2014-02-17 NOTE — Telephone Encounter (Signed)
Left detailed message order for Hemoglobin A1c was added to lab orders.

## 2014-02-18 ENCOUNTER — Other Ambulatory Visit (INDEPENDENT_AMBULATORY_CARE_PROVIDER_SITE_OTHER): Payer: Medicare HMO

## 2014-02-18 DIAGNOSIS — Z125 Encounter for screening for malignant neoplasm of prostate: Secondary | ICD-10-CM

## 2014-02-18 DIAGNOSIS — Z0000 Encounter for general adult medical examination without abnormal findings: Secondary | ICD-10-CM

## 2014-02-18 DIAGNOSIS — Z136 Encounter for screening for cardiovascular disorders: Secondary | ICD-10-CM

## 2014-02-18 DIAGNOSIS — R7309 Other abnormal glucose: Secondary | ICD-10-CM

## 2014-02-18 DIAGNOSIS — R7302 Impaired glucose tolerance (oral): Secondary | ICD-10-CM

## 2014-02-18 LAB — CBC WITH DIFFERENTIAL/PLATELET
BASOS ABS: 0 10*3/uL (ref 0.0–0.1)
Basophils Relative: 0.5 % (ref 0.0–3.0)
EOS ABS: 0.1 10*3/uL (ref 0.0–0.7)
Eosinophils Relative: 2 % (ref 0.0–5.0)
HEMATOCRIT: 48.2 % (ref 39.0–52.0)
Hemoglobin: 16.2 g/dL (ref 13.0–17.0)
LYMPHS ABS: 1.7 10*3/uL (ref 0.7–4.0)
Lymphocytes Relative: 26.6 % (ref 12.0–46.0)
MCHC: 33.7 g/dL (ref 30.0–36.0)
MCV: 92.5 fl (ref 78.0–100.0)
MONO ABS: 0.6 10*3/uL (ref 0.1–1.0)
Monocytes Relative: 9.2 % (ref 3.0–12.0)
NEUTROS PCT: 61.7 % (ref 43.0–77.0)
Neutro Abs: 3.9 10*3/uL (ref 1.4–7.7)
PLATELETS: 166 10*3/uL (ref 150.0–400.0)
RBC: 5.21 Mil/uL (ref 4.22–5.81)
RDW: 13 % (ref 11.5–14.6)
WBC: 6.4 10*3/uL (ref 4.5–10.5)

## 2014-02-18 LAB — BASIC METABOLIC PANEL
BUN: 17 mg/dL (ref 6–23)
CHLORIDE: 104 meq/L (ref 96–112)
CO2: 30 meq/L (ref 19–32)
CREATININE: 1.1 mg/dL (ref 0.4–1.5)
Calcium: 9.5 mg/dL (ref 8.4–10.5)
GFR: 67.61 mL/min (ref >60.00)
GLUCOSE: 105 mg/dL — AB (ref 70–99)
Potassium: 4.1 mEq/L (ref 3.5–5.1)
Sodium: 139 mEq/L (ref 135–145)

## 2014-02-18 LAB — HEPATIC FUNCTION PANEL
ALT: 27 U/L (ref 0–53)
AST: 25 U/L (ref 0–37)
Albumin: 4.4 g/dL (ref 3.5–5.2)
Alkaline Phosphatase: 52 U/L (ref 39–117)
BILIRUBIN DIRECT: 0.1 mg/dL (ref 0.0–0.3)
BILIRUBIN TOTAL: 1.1 mg/dL (ref 0.3–1.2)
Total Protein: 7.1 g/dL (ref 6.0–8.3)

## 2014-02-18 LAB — POCT URINALYSIS DIPSTICK
BILIRUBIN UA: NEGATIVE
Glucose, UA: NEGATIVE
KETONES UA: NEGATIVE
LEUKOCYTES UA: NEGATIVE
NITRITE UA: NEGATIVE
PH UA: 5.5
Protein, UA: NEGATIVE
Spec Grav, UA: 1.02
Urobilinogen, UA: 0.2

## 2014-02-18 LAB — LIPID PANEL
CHOL/HDL RATIO: 5
Cholesterol: 161 mg/dL (ref 0–200)
HDL: 33.5 mg/dL — AB (ref >39.00)
LDL Cholesterol: 100 mg/dL — ABNORMAL HIGH (ref 0–99)
TRIGLYCERIDES: 140 mg/dL (ref 0.0–149.0)
VLDL: 28 mg/dL (ref 0.0–40.0)

## 2014-02-18 LAB — TSH: TSH: 1.42 u[IU]/mL (ref 0.35–5.50)

## 2014-02-18 LAB — PSA: PSA: 1.15 ng/mL (ref 0.10–4.00)

## 2014-02-18 LAB — HEMOGLOBIN A1C: HEMOGLOBIN A1C: 6.2 % (ref 4.6–6.5)

## 2014-02-25 ENCOUNTER — Ambulatory Visit (INDEPENDENT_AMBULATORY_CARE_PROVIDER_SITE_OTHER): Payer: Medicare HMO | Admitting: Internal Medicine

## 2014-02-25 ENCOUNTER — Encounter: Payer: Self-pay | Admitting: Internal Medicine

## 2014-02-25 VITALS — BP 110/64 | HR 67 | Temp 98.0°F | Resp 20 | Ht 67.5 in | Wt 215.0 lb

## 2014-02-25 DIAGNOSIS — K219 Gastro-esophageal reflux disease without esophagitis: Secondary | ICD-10-CM

## 2014-02-25 DIAGNOSIS — E785 Hyperlipidemia, unspecified: Secondary | ICD-10-CM

## 2014-02-25 DIAGNOSIS — Z0000 Encounter for general adult medical examination without abnormal findings: Secondary | ICD-10-CM

## 2014-02-25 DIAGNOSIS — R7309 Other abnormal glucose: Secondary | ICD-10-CM

## 2014-02-25 DIAGNOSIS — E039 Hypothyroidism, unspecified: Secondary | ICD-10-CM

## 2014-02-25 DIAGNOSIS — Z23 Encounter for immunization: Secondary | ICD-10-CM

## 2014-02-25 DIAGNOSIS — I2510 Atherosclerotic heart disease of native coronary artery without angina pectoris: Secondary | ICD-10-CM

## 2014-02-25 DIAGNOSIS — R7302 Impaired glucose tolerance (oral): Secondary | ICD-10-CM

## 2014-02-25 NOTE — Patient Instructions (Signed)
Limit your sodium (Salt) intake    It is important that you exercise regularly, at least 20 minutes 3 to 4 times per week.  If you develop chest pain or shortness of breath seek  medical attention.  You need to lose weight.  Consider a lower calorie diet and regular exercise.  Return in one year for follow-up 

## 2014-02-25 NOTE — Progress Notes (Signed)
Pre-visit discussion using our clinic review tool. No additional management support is needed unless otherwise documented below in the visit note.  

## 2014-02-25 NOTE — Progress Notes (Signed)
Subjective:    Patient ID: Adrian Gamble, male    DOB: [birth year], 68 y.o.   MRN: [id number]  HPI  68 year old patient who is seen today for a preventive health examination. He is status post CABG in July of 2013. He is doing quite well. Medical problems include impaired glucose tolerance hypothyroidism and dyslipidemia.   Here for Medicare AWV:   1. Risk factors based on Past M, S, F history: patient has known coronary artery disease, status post stenting And CABG  2. Physical Activities: no restrictions or limitations -active with golf, health club  3. Depression/mood: history depression, or mood disorder  4. Hearing: no auditory deficits  5. ADL's: completely active and independent in all aspects of daily living  6. Fall Risk: low  7. Home Safety: no problems identified  8. Height, weight, &visual acuity: increase in weight; no change in height. Her weight has had a recent eye examination and glaucoma check  9. Counseling: heart healthy diet or regular exercise encouraged as well as modest weight loss  10. Labs ordered based on risk factors: laboratory studies, including lipid profile reviewed. LDL approximately 100 Will intensify statin therapy  11. Referral Coordination- not appropriate at this time  12. Care Plan- heart healthy diet modest weight loss and exercise regimen encouraged  13. Cognitive Assessment- alert and oriented, with normal affect   Preventive Screening-Counseling & Management  Alcohol-Tobacco  Smoking Status: current   Allergies (verified):  No Known Drug Allergies   Past History:  Past Medical History:    Coronary artery disease status post stenting 2001 Status post CABG July 2013  Hyperlipidemia  Peptic ulcer disease  patient admitted to the hospital for chest pain, and 2005, underwent heart catheterization and also had a negative Cardiolite stress test  GERD  testosterone deficiency  Hypothyroidism  history of impaired glucose tolerance   Past  Surgical History:   Tonsillectomy  colonoscopy 2005  Cardiolite stress test February 2009 Status post CABG July 2013  Family History:   father age 71 memory loss  mother, age 43  One brother deceased with hepatitis and half brother, coronary artery disease  one sister at 54, is well   Social History:  Reviewed history from 07/06/2008 and no changes required.  Married  Regular exercise-no   Past Medical History  Diagnosis Date  . CORONARY ARTERY DISEASE 09/01/2007    s/p MI - s/p PCI to RCA 2001;  [facility] 7/13: 3v CAD, good LVF - >  s/p CABG 7/13 (L-LAD, S-OM1/dCFX, S-dRCA)  . GERD 09/01/2007  . HYPERLIPIDEMIA 09/01/2007    takes Simvastatin daily  . PEPTIC ULCER DISEASE 09/01/2007  . 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 Impaired glucose tolerance     A1C 6.8 in 06/2012  . H/O hiatal hernia   . Sleep apnea   . Neck pain     pinched nerve  . History of gastric ulcer 24yrs ago  . Diverticulosis   . Nocturia   . HYPOTHYROIDISM 05/29/2009    takes Synthroid daily  . Early cataracts, bilateral     History   Social History  . Marital Status: Married    Spouse Name: N/A    Number of Children: N/A  . Years of Education: N/A   Occupational History  . Retired    Social History Main Topics  . Smoking status: Former Smoker    Types: Pipe, Cigars    Quit date: 06/01/2012  . Smokeless tobacco: Never Used     Comment: patient states  that he is not somking the pipe or cigars any more  . Alcohol Use: Yes     Comment: 06/02/12 "not a regular drinker; have had alcohol in past; last time 11/26/11"  . Drug Use: No  . Sexual Activity: Yes   Other Topics Concern  . Not on file   Social History Narrative  . No narrative on file    Past Surgical History  Procedure Laterality Date  . Cardiac catheterization  2005; 06/01/2012  . Tonsillectomy and adenoidectomy      at age 32  . Arm surgery  at age 69    d/t broken  . Colonoscopy    . Esophagogastroduodenoscopy    . Coronary  angioplasty with stent placement  2001    RCA-1 stent  . Coronary artery bypass graft  06/09/2012    Procedure: CORONARY ARTERY BYPASS GRAFTING (CABG);  Surgeon: Grace Isaac, MD;  Location: [facility];  Service: Open Heart Surgery;  Laterality: N/A;  CABG x four;  using left internal mammary artery and right leg greater saphenous vein harvested endoscopically    Family History  Problem Relation Age of Onset  . Memory loss Father   . Coronary artery disease Father 31    cabg    No Known Allergies  Current Outpatient Prescriptions on File Prior to Visit  Medication Sig Dispense Refill  . aspirin 325 MG tablet Take 325 mg by mouth daily.      Marland Kitchen levothyroxine (SYNTHROID, LEVOTHROID) 125 MCG tablet TAKE ONE TABLET BY MOUTH ONCE DAILY  90 tablet  1  . Nutritional Supplements (JUICE PLUS FIBRE PO) Take 1 capsule by mouth daily.      . simvastatin (ZOCOR) 40 MG tablet TAKE ONE TABLET BY MOUTH AT BEDTIME  90 tablet  1   No current facility-administered medications on file prior to visit.    BP 110/64  Pulse 67  Temp(Src) 98 F (36.7 C) (Oral)  Resp 20  Ht 5' 7.5" (1.715 m)  Wt 215 lb (97.523 kg)  BMI 33.16 kg/m2  SpO2 97%     Review of Systems  Constitutional: Negative for fever, chills, activity change, appetite change and fatigue.  HENT: Negative for congestion, dental problem, ear pain, hearing loss, mouth sores, rhinorrhea, sinus pressure, sneezing, tinnitus, trouble swallowing and voice change.   Eyes: Negative for photophobia, pain, redness and visual disturbance.  Respiratory: Negative for apnea, cough, choking, chest tightness, shortness of breath and wheezing.   Cardiovascular: Negative for chest pain, palpitations and leg swelling.  Gastrointestinal: Negative for nausea, vomiting, abdominal pain, diarrhea, constipation, blood in stool, abdominal distention, anal bleeding and rectal pain.  Genitourinary: Negative for dysuria, urgency, frequency (nocturia x2), hematuria,  flank pain, decreased urine volume, discharge, penile swelling, scrotal swelling, difficulty urinating, genital sores and testicular pain.  Musculoskeletal: Negative for arthralgias, back pain, gait problem, joint swelling, myalgias, neck pain and neck stiffness.  Skin: Negative for color change, rash and wound.  Neurological: Negative for dizziness, tremors, seizures, syncope, facial asymmetry, speech difficulty, weakness, light-headedness, numbness and headaches.  Hematological: Negative for adenopathy. Does not bruise/bleed easily.  Psychiatric/Behavioral: Negative for suicidal ideas, hallucinations, behavioral problems, confusion, sleep disturbance, self-injury, dysphoric mood, decreased concentration and agitation. The patient is not nervous/anxious.        Objective:   Physical Exam  Constitutional: He appears well-developed and well-nourished.  HENT:  Head: Normocephalic and atraumatic.  Right Ear: External ear normal.  Left Ear: External ear normal.  Nose: Nose normal.  Mouth/Throat: Oropharynx is clear and moist.  Eyes: Conjunctivae and EOM are normal. Pupils are equal, round, and reactive to light. No scleral icterus.  Neck: Normal range of motion. Neck supple. No JVD present. No thyromegaly present.  Cardiovascular: Regular rhythm, normal heart sounds and intact distal pulses.  Exam reveals no gallop and no friction rub.   No murmur heard. Dorsalis pedis pulses faint posterior tibial pulses full  Pulmonary/Chest: Effort normal and breath sounds normal. He exhibits no tenderness.  Abdominal: Soft. Bowel sounds are normal. He exhibits no distension and no mass. There is no tenderness.  Genitourinary: Penis normal. Guaiac negative stool.  Prostate plus 2  Musculoskeletal: Normal range of motion. He exhibits no edema and no tenderness.  Lymphadenopathy:    He has no cervical adenopathy.  Neurological: He is alert. He has normal reflexes. No cranial nerve deficit. Coordination  normal.  Skin: Skin is warm and dry. No rash noted.  Psychiatric: He has a normal mood and affect. His behavior is normal.          Assessment & Plan:  Preventive health exam Coronary artery disease status post CABG Impaired  glucose tolerance stable Hypothyroidism   Needs followup colonoscopy Weight loss, more vigorous exercise regimen.  Encouraged

## 2014-03-11 ENCOUNTER — Telehealth: Payer: Self-pay | Admitting: Internal Medicine

## 2014-03-11 ENCOUNTER — Encounter: Payer: Self-pay | Admitting: Internal Medicine

## 2014-03-11 DIAGNOSIS — Z1211 Encounter for screening for malignant neoplasm of colon: Secondary | ICD-10-CM

## 2014-03-11 NOTE — Telephone Encounter (Signed)
Spoke to pt told him order done for referral to GI and someone will contact you regarding an appointment. Told pt they will find provider who takes his insurance. Pt verbalized understanding.

## 2014-03-11 NOTE — Telephone Encounter (Signed)
Pt states he need dr. Raliegh Ip to refer him to a doctor for a colonoscopy, states he had spoken with dr. Raliegh Ip about a doctor he had previously, however that doctor is not in his network.

## 2014-04-21 ENCOUNTER — Ambulatory Visit (AMBULATORY_SURGERY_CENTER): Payer: Self-pay | Admitting: *Deleted

## 2014-04-21 VITALS — Ht 68.0 in | Wt 218.4 lb

## 2014-04-21 DIAGNOSIS — Z1211 Encounter for screening for malignant neoplasm of colon: Secondary | ICD-10-CM

## 2014-04-21 MED ORDER — MOVIPREP 100 G PO SOLR: ORAL | Status: DC

## 2014-04-21 NOTE — Progress Notes (Signed)
No allergies to eggs or soy. No problems with anesthesia.  Pt given Emmi instructions for colonoscopy  No oxygen use  No diet drug use  

## 2014-05-05 ENCOUNTER — Encounter: Payer: Self-pay | Admitting: Internal Medicine

## 2014-05-05 ENCOUNTER — Ambulatory Visit (AMBULATORY_SURGERY_CENTER): Payer: Medicare HMO | Admitting: Internal Medicine

## 2014-05-05 VITALS — BP 110/67 | HR 66 | Temp 96.8°F | Resp 13 | Ht 68.0 in | Wt 218.0 lb

## 2014-05-05 DIAGNOSIS — Z1211 Encounter for screening for malignant neoplasm of colon: Secondary | ICD-10-CM

## 2014-05-05 MED ORDER — SODIUM CHLORIDE 0.9 % IV SOLN: 500.0000 mL | INTRAVENOUS | Status: DC

## 2014-05-05 MED ORDER — FLEET ENEMA 7-19 GM/118ML RE ENEM
1.0000 | ENEMA | Freq: Once | RECTAL | Status: AC
  Administered 2014-05-05 (×2): 1 via RECTAL

## 2014-05-05 MED ORDER — FLEET ENEMA 7-19 GM/118ML RE ENEM: 1.0000 | ENEMA | Freq: Once | RECTAL | Status: DC

## 2014-05-05 NOTE — Patient Instructions (Signed)
Discharge instructions given with verbal understanding. Handouts on diverticulosis and a high fiber diet. Resume previous medications. YOU HAD AN ENDOSCOPIC PROCEDURE TODAY AT THE [facility]: Refer to the procedure report that was given to you for any specific questions about what was found during the examination.  If the procedure report does not answer your questions, please call your gastroenterologist to clarify.  If you requested that your care partner not be given the details of your procedure findings, then the procedure report has been included in a sealed envelope for you to review at your convenience later.  YOU SHOULD EXPECT: Some feelings of bloating in the abdomen. Passage of more gas than usual.  Walking can help get rid of the air that was put into your GI tract during the procedure and reduce the bloating. If you had a lower endoscopy (such as a colonoscopy or flexible sigmoidoscopy) you may notice spotting of blood in your stool or on the toilet paper. If you underwent a bowel prep for your procedure, then you may not have a normal bowel movement for a few days.  DIET: Your first meal following the procedure should be a light meal and then it is ok to progress to your normal diet.  A half-sandwich or bowl of soup is an example of a good first meal.  Heavy or fried foods are harder to digest and may make you feel nauseous or bloated.  Likewise meals heavy in dairy and vegetables can cause extra gas to form and this can also increase the bloating.  Drink plenty of fluids but you should avoid alcoholic beverages for 24 hours.  ACTIVITY: Your care partner should take you home directly after the procedure.  You should plan to take it easy, moving slowly for the rest of the day.  You can resume normal activity the day after the procedure however you should NOT DRIVE or use heavy machinery for 24 hours (because of the sedation medicines used during the test).    SYMPTOMS TO REPORT  IMMEDIATELY: A gastroenterologist can be reached at any hour.  During normal business hours, 8:30 AM to 5:00 PM Monday through Friday, call [phone].  After hours and on weekends, please call the GI answering service at [phone] who will take a message and have the physician on call contact you.   Following lower endoscopy (colonoscopy or flexible sigmoidoscopy):  Excessive amounts of blood in the stool  Significant tenderness or worsening of abdominal pains  Swelling of the abdomen that is new, acute  Fever of 100F or higher FOLLOW UP: If any biopsies were taken you will be contacted by phone or by letter within the next 1-3 weeks.  Call your gastroenterologist if you have not heard about the biopsies in 3 weeks.  Our staff will call the home number listed on your records the next business day following your procedure to check on you and address any questions or concerns that you may have at that time regarding the information given to you following your procedure. This is a courtesy call and so if there is no answer at the home number and we have not heard from you through the emergency physician on call, we will assume that you have returned to your regular daily activities without incident.  SIGNATURES/CONFIDENTIALITY: You and/or your care partner have signed paperwork which will be entered into your electronic medical record.  These signatures attest to the fact that that the information above on your After   Visit Summary has been reviewed and is understood.  Full responsibility of the confidentiality of this discharge information lies with you and/or your care-partner. 

## 2014-05-05 NOTE — Progress Notes (Signed)
First enema with sediment and light brown liquids.  Second enema clear.

## 2014-05-05 NOTE — Op Note (Signed)
[facility]  [address] [place], [id number]   COLONOSCOPY PROCEDURE REPORT  PATIENT: Adrian, Gamble  MR#: [id number] BIRTHDATE: 1946-06-28 , 67  yrs. old GENDER: Male ENDOSCOPIST: Eustace Quail, MD REFERRED [id number] Burnice Logan, M.D. PROCEDURE DATE:  05/05/2014 PROCEDURE:   Colonoscopy, screening First Screening Colonoscopy - Avg.  risk and is 50 yrs.  old or older - No.  Prior Negative Screening - Now for repeat screening. 10 or more years since last screening  History of Adenoma - Now for follow-up colonoscopy & has been > or = to 3 yrs.  N/A  Polyps Removed Today? No.  Recommend repeat exam, <10 yrs? No. ASA CLASS:   Class II INDICATIONS:average risk screening.   Negative index exam 12-2003 (Elsewhere) MEDICATIONS: MAC sedation, administered by CRNA and propofol (Diprivan) 200mg  IV  DESCRIPTION OF PROCEDURE:   After the risks benefits and alternatives of the procedure were thoroughly explained, informed consent was obtained.  A digital rectal exam revealed no abnormalities of the rectum.   The [id number]  endoscope was introduced through the anus and advanced to the cecum, which was identified by both the appendix and ileocecal valve. No adverse events experienced.   The quality of the prep was good, using MoviPrep  The instrument was then slowly withdrawn as the colon was fully examined.      COLON FINDINGS: Severe diverticulosis was noted The finding was in the left colon.   The colon was otherwise normal.  There was no inflammation, polyps or cancers unless previously stated. Retroflexed views revealed internal hemorrhoids. The time to cecum=1 minutes 52 seconds.  Withdrawal time=10 minutes 50 seconds. The scope was withdrawn and the procedure completed.  COMPLICATIONS: There were no complications.  ENDOSCOPIC IMPRESSION: 1.   Severe diverticulosis was noted in the left colon 2.   The colon was otherwise normal  RECOMMENDATIONS: 1.  Continue current colorectal screening recommendations for "routine risk" patients with a repeat colonoscopy in 10 years.   eSigned:  Eustace Quail, MD 05/05/2014 9:43 AM   cc: Marletta Lor, MD and The Patient

## 2014-05-05 NOTE — Progress Notes (Signed)
Procedure ends, to recovery, report given and VSS. 

## 2014-05-06 ENCOUNTER — Telehealth: Payer: Self-pay

## 2014-05-06 NOTE — Telephone Encounter (Signed)
  Follow up Call-  Call back number 05/05/2014  Post procedure Call Back phone  # [phone]  Permission to leave phone message Yes     Patient questions:  Do you have a fever, pain , or abdominal swelling? no Pain Score  0 *  Have you tolerated food without any problems? yes  Have you been able to return to your normal activities? yes  Do you have any questions about your discharge instructions: Diet   no Medications  no Follow up visit  no  Do you have questions or concerns about your Care? no  Actions: * If pain score is 4 or above: No action needed, pain <4.

## 2014-07-01 ENCOUNTER — Other Ambulatory Visit: Payer: Self-pay | Admitting: Internal Medicine

## 2014-07-26 ENCOUNTER — Other Ambulatory Visit: Payer: Self-pay | Admitting: Internal Medicine

## 2014-09-29 ENCOUNTER — Ambulatory Visit (INDEPENDENT_AMBULATORY_CARE_PROVIDER_SITE_OTHER): Payer: Medicare HMO

## 2014-09-29 DIAGNOSIS — Z23 Encounter for immunization: Secondary | ICD-10-CM

## 2014-10-26 ENCOUNTER — Ambulatory Visit (INDEPENDENT_AMBULATORY_CARE_PROVIDER_SITE_OTHER): Payer: Medicare HMO | Admitting: Internal Medicine

## 2014-10-26 ENCOUNTER — Encounter: Payer: Self-pay | Admitting: Internal Medicine

## 2014-10-26 VITALS — BP 110/74 | HR 79 | Temp 98.5°F | Ht 68.0 in | Wt 225.0 lb

## 2014-10-26 DIAGNOSIS — R5383 Other fatigue: Secondary | ICD-10-CM

## 2014-10-26 DIAGNOSIS — E291 Testicular hypofunction: Secondary | ICD-10-CM

## 2014-10-26 DIAGNOSIS — E349 Endocrine disorder, unspecified: Secondary | ICD-10-CM

## 2014-10-26 DIAGNOSIS — E039 Hypothyroidism, unspecified: Secondary | ICD-10-CM

## 2014-10-26 DIAGNOSIS — R7302 Impaired glucose tolerance (oral): Secondary | ICD-10-CM

## 2014-10-26 LAB — HEMOGLOBIN A1C: Hgb A1c MFr Bld: 7.2 % — ABNORMAL HIGH (ref 4.6–6.5)

## 2014-10-26 LAB — TESTOSTERONE: Testosterone: 139.91 ng/dL — ABNORMAL LOW (ref 300.00–890.00)

## 2014-10-26 LAB — CBC WITH DIFFERENTIAL/PLATELET
BASOS ABS: 0 10*3/uL (ref 0.0–0.1)
Basophils Relative: 0.4 % (ref 0.0–3.0)
EOS PCT: 2.1 % (ref 0.0–5.0)
Eosinophils Absolute: 0.1 10*3/uL (ref 0.0–0.7)
HCT: 46.5 % (ref 39.0–52.0)
Hemoglobin: 15.6 g/dL (ref 13.0–17.0)
Lymphocytes Relative: 25.4 % (ref 12.0–46.0)
Lymphs Abs: 1.7 10*3/uL (ref 0.7–4.0)
MCHC: 33.6 g/dL (ref 30.0–36.0)
MCV: 91.3 fl (ref 78.0–100.0)
Monocytes Absolute: 0.5 10*3/uL (ref 0.1–1.0)
Monocytes Relative: 8.2 % (ref 3.0–12.0)
NEUTROS PCT: 63.9 % (ref 43.0–77.0)
Neutro Abs: 4.2 10*3/uL (ref 1.4–7.7)
Platelets: 176 10*3/uL (ref 150.0–400.0)
RBC: 5.09 Mil/uL (ref 4.22–5.81)
RDW: 12.9 % (ref 11.5–15.5)
WBC: 6.6 10*3/uL (ref 4.0–10.5)

## 2014-10-26 LAB — COMPREHENSIVE METABOLIC PANEL
ALBUMIN: 3.9 g/dL (ref 3.5–5.2)
ALT: 29 U/L (ref 0–53)
AST: 26 U/L (ref 0–37)
Alkaline Phosphatase: 59 U/L (ref 39–117)
BUN: 14 mg/dL (ref 6–23)
CALCIUM: 9.2 mg/dL (ref 8.4–10.5)
CO2: 26 mEq/L (ref 19–32)
Chloride: 103 mEq/L (ref 96–112)
Creatinine, Ser: 1.1 mg/dL (ref 0.4–1.5)
GFR: 72.59 mL/min (ref >60.00)
GLUCOSE: 126 mg/dL — AB (ref 70–99)
POTASSIUM: 3.8 meq/L (ref 3.5–5.1)
SODIUM: 138 meq/L (ref 135–145)
TOTAL PROTEIN: 7.1 g/dL (ref 6.0–8.3)
Total Bilirubin: 0.5 mg/dL (ref 0.2–1.2)

## 2014-10-26 LAB — SEDIMENTATION RATE: SED RATE: 17 mm/h (ref 0–22)

## 2014-10-26 LAB — TSH: TSH: 1.03 u[IU]/mL (ref 0.35–4.50)

## 2014-10-26 NOTE — Progress Notes (Signed)
Subjective:    Patient ID: Adrian Gamble, male    DOB: 02-05-1946, 68 y.o.   MRN: [id number]  HPI 68 year old patient who presents with a 3-4 week history of intermittent fatigue.  He states that occasionally he is quite fatigued throughout the day and feels like that he has had little sleep the night before.  He is sleeping well.  On other days he feels quite well without concerns.  He does go to his health club on a less regular basis and when he does exercise.  He has no limitations.  There is been no change in his appetite or weight loss.  No fever or other constitutional complaints  Past Medical History  Diagnosis Date  . CORONARY ARTERY DISEASE 09/01/2007    s/p MI - s/p PCI to RCA 2001;  [place] 7/13: 3v CAD, good LVF - >  s/p CABG 7/13 (L-LAD, S-OM1/dCFX, S-dRCA)  . GERD 09/01/2007  . HYPERLIPIDEMIA 09/01/2007    takes Simvastatin daily  . PEPTIC ULCER DISEASE 09/01/2007  . 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 Impaired glucose tolerance     A1C 6.8 in 06/2012  . H/O hiatal hernia   . Sleep apnea     pt denies  . Neck pain     pinched nerve  . History of gastric ulcer 97yrs ago  . Diverticulosis   . Nocturia   . HYPOTHYROIDISM 05/29/2009    takes Synthroid daily  . Early cataracts, bilateral   . MI, old     History   Social History  . Marital Status: Married    Spouse Name: N/A    Number of Children: N/A  . Years of Education: N/A   Occupational History  . Retired    Social History Main Topics  . Smoking status: Former Smoker    Types: Pipe, Cigars    Quit date: 06/01/2012  . Smokeless tobacco: Never Used     Comment: patient states that he is not somking the pipe or cigars any more  . Alcohol Use: Yes     Comment: rare  . Drug Use: No  . Sexual Activity: Yes   Other Topics Concern  . Not on file   Social History Narrative    Past Surgical History  Procedure Laterality Date  . Cardiac catheterization  2005; 06/01/2012  . Tonsillectomy and adenoidectomy  1955      at age 32  . Arm surgery  1957    d/t broken  . Colonoscopy  2005  . Esophagogastroduodenoscopy    . Coronary angioplasty with stent placement  2001    RCA-1 stent  . Coronary artery bypass graft  06/09/2012    Procedure: CORONARY ARTERY BYPASS GRAFTING (CABG);  Surgeon: Grace Isaac, MD;  Location: [facility];  Service: Open Heart Surgery;  Laterality: N/A;  CABG x four;  using left internal mammary artery and right leg greater saphenous vein harvested endoscopically    Family History  Problem Relation Age of Onset  . Memory loss Father   . Coronary artery disease Father 45    cabg  . Colon cancer Neg Hx     No Known Allergies  Current Outpatient Prescriptions on File Prior to Visit  Medication Sig Dispense Refill  . aspirin 325 MG tablet Take 325 mg by mouth daily.    Marland Kitchen levothyroxine (SYNTHROID, LEVOTHROID) 125 MCG tablet TAKE ONE TABLET BY MOUTH ONCE DAILY. 90 tablet 1  . metoprolol tartrate (LOPRESSOR) 25 MG tablet HALF TABLET BY MOUTH  TWICE A DAY    . simvastatin (ZOCOR) 40 MG tablet TAKE ONE TABLET BY MOUTH AT BEDTIME 90 tablet 1   No current facility-administered medications on file prior to visit.    BP 110/74 mmHg  Pulse 79  Temp(Src) 98.5 F (36.9 C) (Oral)  Ht 5\' 8"  (1.727 m)  Wt 225 lb (102.059 kg)  BMI 34.22 kg/m2      Review of Systems  Constitutional: Positive for fatigue. Negative for fever, chills and appetite change.  HENT: Negative for congestion, dental problem, ear pain, hearing loss, sore throat, tinnitus, trouble swallowing and voice change.   Eyes: Negative for pain, discharge and visual disturbance.  Respiratory: Negative for cough, chest tightness, wheezing and stridor.   Cardiovascular: Negative for chest pain, palpitations and leg swelling.  Gastrointestinal: Negative for nausea, vomiting, abdominal pain, diarrhea, constipation, blood in stool and abdominal distention.  Genitourinary: Negative for urgency, hematuria, flank pain,  discharge, difficulty urinating and genital sores.  Musculoskeletal: Negative for myalgias, back pain, joint swelling, arthralgias, gait problem and neck stiffness.  Skin: Negative for rash.  Neurological: Negative for dizziness, syncope, speech difficulty, weakness, numbness and headaches.  Hematological: Negative for adenopathy. Does not bruise/bleed easily.  Psychiatric/Behavioral: Negative for behavioral problems and dysphoric mood. The patient is not nervous/anxious.        Objective:   Physical Exam  Constitutional: He is oriented to person, place, and time. He appears well-developed.  HENT:  Head: Normocephalic.  Right Ear: External ear normal.  Left Ear: External ear normal.  Eyes: Conjunctivae and EOM are normal.  Neck: Normal range of motion.  Cardiovascular: Normal rate and normal heart sounds.   Pulmonary/Chest: Breath sounds normal.  Abdominal: Bowel sounds are normal.  Musculoskeletal: Normal range of motion. He exhibits no edema or tenderness.  Neurological: He is alert and oriented to person, place, and time.  Psychiatric: He has a normal mood and affect. His behavior is normal.          Assessment & Plan:   Fatigue of 3-4 weeks duration.  Fatigue is intermittent and not associated with any exercise intolerance.  We'll check some screening lab including a testosterone level, TSH and CBC and observe.  He will report any clinical worsening.  Return when necessary if unimproved Testosterone deficiency.  Check total testosterone level Hypothyroidism.  Check TSH History of peptic ulcer disease.  Check CBC

## 2014-10-26 NOTE — Patient Instructions (Signed)
It is important that you exercise regularly, at least 20 minutes 3 to 4 times per week.  If you develop chest pain or shortness of breath seek  medical attention.  Call or return to clinic prn if these symptoms worsen or fail to improve as anticipated.  

## 2014-10-26 NOTE — Progress Notes (Signed)
Pre visit review using our clinic review tool, if applicable. No additional management support is needed unless otherwise documented below in the visit note. 

## 2014-11-10 ENCOUNTER — Encounter (HOSPITAL_COMMUNITY): Payer: Self-pay | Admitting: Cardiovascular Disease

## 2015-01-03 ENCOUNTER — Ambulatory Visit (INDEPENDENT_AMBULATORY_CARE_PROVIDER_SITE_OTHER): Payer: Medicare HMO | Admitting: Cardiology

## 2015-01-03 ENCOUNTER — Encounter: Payer: Self-pay | Admitting: Cardiology

## 2015-01-03 VITALS — BP 120/80 | HR 62 | Ht 68.0 in | Wt 224.0 lb

## 2015-01-03 DIAGNOSIS — I2583 Coronary atherosclerosis due to lipid rich plaque: Principal | ICD-10-CM

## 2015-01-03 DIAGNOSIS — I2510 Atherosclerotic heart disease of native coronary artery without angina pectoris: Secondary | ICD-10-CM

## 2015-01-03 NOTE — Progress Notes (Signed)
HPI The patient presents for follow up of CAD.  He is status post remote PCI to his RCA in 2001, HL, DM2, hypothyroidism, GERD. He was seen in clinic in June after a long hiatus. He was complaining of symptoms consistent with progressive angina and he was set up for cardiac catheterization. This was performed 06/01/12 and demonstrated 3 vessel CAD with preserved LV function. CABG was recommended. He was taken off of Plavix and discharged to home and brought back on 06/09/12. He underwent CABG with Dr. Servando Snare grafts included LIMA-LAD, SVG-OM1 and distal circumflex, SVG-distal RCA. Postop course was fairly uneventful.  Since last being seen the patient has had symptoms. He is disappointed because he continues to gain weight.   He exercises routinely and watches his diet.  The patient denies any new symptoms such as chest discomfort, neck or arm discomfort. There has been no new shortness of breath, PND or orthopnea. There have been no reported palpitations, presyncope or syncope.   He has none of the mild chest discomfort he was having at the last visit.    No Known Allergies  Current Outpatient Prescriptions  Medication Sig Dispense Refill  . aspirin 325 MG tablet Take 325 mg by mouth daily.    Marland Kitchen levothyroxine (SYNTHROID, LEVOTHROID) 125 MCG tablet TAKE ONE TABLET BY MOUTH ONCE DAILY. 90 tablet 1  . metoprolol tartrate (LOPRESSOR) 25 MG tablet HALF TABLET BY MOUTH TWICE A DAY    . simvastatin (ZOCOR) 40 MG tablet TAKE ONE TABLET BY MOUTH AT BEDTIME 90 tablet 1   No current facility-administered medications for this visit.    Past Medical History  Diagnosis Date  . CORONARY ARTERY DISEASE 09/01/2007    s/p MI - s/p PCI to RCA 2001;  [place] 7/13: 3v CAD, good LVF - >  s/p CABG 7/13 (L-LAD, S-OM1/dCFX, S-dRCA)  . GERD 09/01/2007  . HYPERLIPIDEMIA 09/01/2007    takes Simvastatin daily  . PEPTIC ULCER DISEASE 09/01/2007  . 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 Impaired glucose tolerance     A1C 6.8  in 06/2012  . H/O hiatal hernia   . Sleep apnea     pt denies  . Neck pain     pinched nerve  . History of gastric ulcer 4yrs ago  . Diverticulosis   . Nocturia   . HYPOTHYROIDISM 05/29/2009    takes Synthroid daily  . Early cataracts, bilateral   . MI, old     Past Surgical History  Procedure Laterality Date  . Cardiac catheterization  2005; 06/01/2012  . Tonsillectomy and adenoidectomy  1955    at age 9  . Arm surgery  1957    d/t broken  . Colonoscopy  2005  . Esophagogastroduodenoscopy    . Coronary angioplasty with stent placement  2001    RCA-1 stent  . Coronary artery bypass graft  06/09/2012    Procedure: CORONARY ARTERY BYPASS GRAFTING (CABG);  Surgeon: Grace Isaac, MD;  Location: [place];  Service: Open Heart Surgery;  Laterality: N/A;  CABG x four;  using left internal mammary artery and right leg greater saphenous vein harvested endoscopically  . Percutaneous coronary stent intervention (pci-s) N/A 06/01/2012    Procedure: PERCUTANEOUS CORONARY STENT INTERVENTION (PCI-S);  Surgeon: Burnell Blanks, MD;  [facility]: [facility] C[facility] LAB;  Service: Cardiovascular;  Laterality: N/A;    ROS:  As stated in the HPI and negative for all other systems.   PHYSICAL EXAM BP 120/80 mmHg  Pulse 62  Ht  5\' 8"  (1.727 m)  Wt 224 lb (101.606 kg)  BMI 34.07 kg/m2 GENERAL:  Well appearing NECK:  No jugular venous distention, waveform within normal limits, carotid upstroke brisk and symmetric, no bruits, no thyromegaly LUNGS:  Clear to auscultation bilaterally CHEST:  Well healed sternotomy scar. HEART:  PMI not displaced or sustained,S1 and S2 within normal limits, no S3, no S4, no clicks, no rubs, no murmurs ABD:  Flat, positive bowel sounds normal in frequency in pitch, no bruits, no rebound, no guarding, no midline pulsatile mass, no hepatomegaly, no splenomegaly, obese EXT:  2 plus pulses throughout, no edema, no cyanosis no clubbing   EKG:  Sinus rhythm, rate 62 axis with in  normal limits, intervals within normal limits, no acute ST-T wave changes.  01/03/2015  ASSESSMENT AND PLAN  CAD  The patient has no new sypmtoms.  No further cardiovascular testing is indicated.  We will continue with aggressive risk reduction and meds as listed.  Hyperlipidemia  His last LDL excellent with a low HDL.  He will continue the meds as listed.

## 2015-01-03 NOTE — Patient Instructions (Signed)
Your physician recommends that you schedule a follow-up appointment in: one year with Dr. Hochrein  

## 2015-01-04 ENCOUNTER — Ambulatory Visit: Payer: Medicare HMO | Admitting: Cardiology

## 2015-01-19 ENCOUNTER — Other Ambulatory Visit: Payer: Self-pay | Admitting: Internal Medicine

## 2015-01-25 ENCOUNTER — Telehealth: Payer: Self-pay | Admitting: Internal Medicine

## 2015-01-25 DIAGNOSIS — R7302 Impaired glucose tolerance (oral): Secondary | ICD-10-CM

## 2015-01-25 NOTE — Telephone Encounter (Signed)
Pt is sch for cpx labs on 04-18-15. Pt would like to add A1C to blood work. Can I sch?

## 2015-01-26 NOTE — Telephone Encounter (Signed)
Yes, pt has impaired glucose tolerance okay to add Hemoglobin A1c. I will put orders in.

## 2015-01-26 NOTE — Telephone Encounter (Signed)
Pt is aware.  

## 2015-04-18 ENCOUNTER — Other Ambulatory Visit (INDEPENDENT_AMBULATORY_CARE_PROVIDER_SITE_OTHER): Payer: Medicare HMO

## 2015-04-18 DIAGNOSIS — E039 Hypothyroidism, unspecified: Secondary | ICD-10-CM | POA: Diagnosis not present

## 2015-04-18 DIAGNOSIS — R7302 Impaired glucose tolerance (oral): Secondary | ICD-10-CM | POA: Diagnosis not present

## 2015-04-18 DIAGNOSIS — Z0000 Encounter for general adult medical examination without abnormal findings: Secondary | ICD-10-CM | POA: Diagnosis not present

## 2015-04-18 DIAGNOSIS — Z125 Encounter for screening for malignant neoplasm of prostate: Secondary | ICD-10-CM

## 2015-04-18 LAB — BASIC METABOLIC PANEL
BUN: 12 mg/dL (ref 6–23)
CALCIUM: 9.5 mg/dL (ref 8.4–10.5)
CO2: 28 mEq/L (ref 19–32)
Chloride: 107 mEq/L (ref 96–112)
Creatinine, Ser: 1.07 mg/dL (ref 0.40–1.50)
GFR: 72.49 mL/min (ref >60.00)
Glucose, Bld: 119 mg/dL — ABNORMAL HIGH (ref 70–99)
Potassium: 4.4 mEq/L (ref 3.5–5.1)
SODIUM: 141 meq/L (ref 135–145)

## 2015-04-18 LAB — POCT URINALYSIS DIPSTICK
BILIRUBIN UA: NEGATIVE
Glucose, UA: NEGATIVE
KETONES UA: NEGATIVE
Leukocytes, UA: NEGATIVE
Nitrite, UA: NEGATIVE
PH UA: 5
Protein, UA: NEGATIVE
SPEC GRAV UA: 1.025
Urobilinogen, UA: 0.2

## 2015-04-18 LAB — TSH: TSH: 1.38 u[IU]/mL (ref 0.35–4.50)

## 2015-04-18 LAB — CBC WITH DIFFERENTIAL/PLATELET
Basophils Absolute: 0 10*3/uL (ref 0.0–0.1)
Basophils Relative: 0.5 % (ref 0.0–3.0)
EOS PCT: 2 % (ref 0.0–5.0)
Eosinophils Absolute: 0.1 10*3/uL (ref 0.0–0.7)
HEMATOCRIT: 46.9 % (ref 39.0–52.0)
HEMOGLOBIN: 15.7 g/dL (ref 13.0–17.0)
LYMPHS ABS: 1.4 10*3/uL (ref 0.7–4.0)
Lymphocytes Relative: 30 % (ref 12.0–46.0)
MCHC: 33.4 g/dL (ref 30.0–36.0)
MCV: 90.3 fl (ref 78.0–100.0)
Monocytes Absolute: 0.4 10*3/uL (ref 0.1–1.0)
Monocytes Relative: 8.6 % (ref 3.0–12.0)
Neutro Abs: 2.7 10*3/uL (ref 1.4–7.7)
Neutrophils Relative %: 58.9 % (ref 43.0–77.0)
PLATELETS: 168 10*3/uL (ref 150.0–400.0)
RBC: 5.19 Mil/uL (ref 4.22–5.81)
RDW: 13.3 % (ref 11.5–15.5)
WBC: 4.7 10*3/uL (ref 4.0–10.5)

## 2015-04-18 LAB — HEPATIC FUNCTION PANEL
ALT: 44 U/L (ref 0–53)
AST: 33 U/L (ref 0–37)
Albumin: 4.1 g/dL (ref 3.5–5.2)
Alkaline Phosphatase: 56 U/L (ref 39–117)
BILIRUBIN DIRECT: 0.1 mg/dL (ref 0.0–0.3)
Total Bilirubin: 0.6 mg/dL (ref 0.2–1.2)
Total Protein: 6.7 g/dL (ref 6.0–8.3)

## 2015-04-18 LAB — LIPID PANEL
CHOLESTEROL: 191 mg/dL (ref 0–200)
HDL: 33.3 mg/dL — ABNORMAL LOW (ref >39.00)
LDL Cholesterol: 129 mg/dL — ABNORMAL HIGH (ref 0–99)
NONHDL: 157.7
Total CHOL/HDL Ratio: 6
Triglycerides: 146 mg/dL (ref 0.0–149.0)
VLDL: 29.2 mg/dL (ref 0.0–40.0)

## 2015-04-18 LAB — PSA: PSA: 1.21 ng/mL (ref 0.10–4.00)

## 2015-04-18 LAB — HEMOGLOBIN A1C: Hgb A1c MFr Bld: 6.6 % — ABNORMAL HIGH (ref 4.6–6.5)

## 2015-04-20 ENCOUNTER — Other Ambulatory Visit: Payer: Self-pay | Admitting: Family Medicine

## 2015-04-21 ENCOUNTER — Other Ambulatory Visit: Payer: Self-pay

## 2015-04-21 MED ORDER — METOPROLOL TARTRATE 25 MG PO TABS: ORAL_TABLET | ORAL | Status: DC

## 2015-04-25 ENCOUNTER — Encounter: Payer: Self-pay | Admitting: Internal Medicine

## 2015-04-25 ENCOUNTER — Other Ambulatory Visit: Payer: Self-pay | Admitting: *Deleted

## 2015-04-25 ENCOUNTER — Ambulatory Visit (INDEPENDENT_AMBULATORY_CARE_PROVIDER_SITE_OTHER): Payer: Medicare HMO | Admitting: Internal Medicine

## 2015-04-25 VITALS — BP 134/88 | HR 61 | Temp 97.8°F | Resp 20 | Ht 67.0 in | Wt 221.0 lb

## 2015-04-25 DIAGNOSIS — Z0000 Encounter for general adult medical examination without abnormal findings: Secondary | ICD-10-CM

## 2015-04-25 DIAGNOSIS — E785 Hyperlipidemia, unspecified: Secondary | ICD-10-CM

## 2015-04-25 DIAGNOSIS — R7302 Impaired glucose tolerance (oral): Secondary | ICD-10-CM

## 2015-04-25 DIAGNOSIS — E039 Hypothyroidism, unspecified: Secondary | ICD-10-CM

## 2015-04-25 DIAGNOSIS — I2510 Atherosclerotic heart disease of native coronary artery without angina pectoris: Secondary | ICD-10-CM

## 2015-04-25 MED ORDER — LEVOTHYROXINE SODIUM 125 MCG PO TABS: 125.0000 ug | ORAL_TABLET | Freq: Every day | ORAL | Status: DC

## 2015-04-25 MED ORDER — ATORVASTATIN CALCIUM 80 MG PO TABS: 80.0000 mg | ORAL_TABLET | Freq: Every day | ORAL | Status: DC

## 2015-04-25 NOTE — Progress Notes (Signed)
Pre visit review using our clinic review tool, if applicable. No additional management support is needed unless otherwise documented below in the visit note. 

## 2015-04-25 NOTE — Progress Notes (Signed)
Subjective:    Patient ID: Adrian Gamble, male    DOB: [birth year], 69 y.o.   MRN: [id number]  HPI   Subjective:    Patient ID: Adrian Gamble, male    DOB: [birth year], 69 y.o.   MRN: [id number]  HPI 22 -year-old patient who is seen today for a preventive health examination.  He is status post CABG in July of 2013. He is doing quite well. Medical problems include impaired glucose tolerance hypothyroidism and dyslipidemia.   Here for Medicare AWV:   1. Risk factors based on Past M, S, F history: patient has known coronary artery disease, status post stenting And CABG  2. Physical Activities: no restrictions or limitations -active with golf, health club  3. Depression/mood: history depression, or mood disorder  4. Hearing: no auditory deficits  5. ADL's: completely active and independent in all aspects of daily living  6. Fall Risk: low  7. Home Safety: no problems identified  8. Height, weight, &visual acuity: increase in weight; no change in height. Her weight has had a recent eye examination and glaucoma check  9. Counseling: heart healthy diet or regular exercise encouraged as well as modest weight loss  10. Labs ordered based on risk factors: laboratory studies, including lipid profile reviewed. LDL approximately 100 Will intensify statin therapy  11. Referral Coordination- not appropriate at this time  12. Care Plan- heart healthy diet modest weight loss and exercise regimen encouraged  13. Cognitive Assessment- alert and oriented, with normal affect  14.  Preventive services will include colonoscopies at 10 year intervals.  He will be followed by cardiology and ophthalmology on an annual basis.  He'll be seen annually for a preventive health examination with screening lab 15.  Provider list includes primary care cardiology, ophthalmology.  Preventive Screening-Counseling & Management  Alcohol-Tobacco  Smoking Status: current   Allergies (verified):  No Known Drug Allergies   Past  History:  Past Medical History:    Coronary artery disease status post stenting 2001 Status post CABG July 2013  Hyperlipidemia  Peptic ulcer disease  patient admitted to the hospital for chest pain, and 2005, underwent heart catheterization and also had a negative Cardiolite stress test  GERD  testosterone deficiency  Hypothyroidism  history of impaired glucose tolerance   Past Surgical History:   Tonsillectomy  colonoscopy 2005  2015 Cardiolite stress test February 2009 Status post CABG July 2013  Family History:   father age 22 memory loss  mother, age 6  One brother deceased with hepatitis and half brother, coronary artery disease  one sister at 18, is well   Social History:  Reviewed history from 07/06/2008 and no changes required.  Married  Regular exercise-no    Past Medical History  Diagnosis Date  . CORONARY ARTERY DISEASE 09/01/2007    s/p MI - s/p PCI to RCA 2001;  [place] 7/13: 3v CAD, good LVF - >  s/p CABG 7/13 (L-LAD, S-OM1/dCFX, S-dRCA)  . GERD 09/01/2007  . HYPERLIPIDEMIA 09/01/2007    takes Simvastatin daily  . PEPTIC ULCER DISEASE 09/01/2007  . 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 Impaired glucose tolerance     A1C 6.8 in 06/2012  . H/O hiatal hernia   . Sleep apnea     pt denies  . Neck pain     pinched nerve  . History of gastric ulcer 62yrs ago  . Diverticulosis   . Nocturia   . HYPOTHYROIDISM 05/29/2009    takes Synthroid daily  . Early cataracts,  bilateral   . MI, old     History   Social History  . Marital Status: Married    Spouse Name: N/A  . Number of Children: N/A  . Years of Education: N/A   Occupational History  . Retired    Social History Main Topics  . Smoking status: Former Smoker    Types: Pipe, Cigars    Quit date: 06/01/2012  . Smokeless tobacco: Never Used     Comment: patient states that he is not somking the pipe or cigars any more  . Alcohol Use: Yes     Comment: rare  . Drug Use: No  . Sexual Activity: Yes     Other Topics Concern  . Not on file   Social History Narrative    Past Surgical History  Procedure Laterality Date  . Cardiac catheterization  2005; 06/01/2012  . Tonsillectomy and adenoidectomy  1955    at age 47  . Arm surgery  1957    d/t broken  . Colonoscopy  2005  . Esophagogastroduodenoscopy    . Coronary angioplasty with stent placement  2001    RCA-1 stent  . Coronary artery bypass graft  06/09/2012    Procedure: CORONARY ARTERY BYPASS GRAFTING (CABG);  Surgeon: Grace Isaac, MD;  Location: [facility];  Service: Open Heart Surgery;  Laterality: N/A;  CABG x four;  using left internal mammary artery and right leg greater saphenous vein harvested endoscopically  . Percutaneous coronary stent intervention (pci-s) N/A 06/01/2012    Procedure: PERCUTANEOUS CORONARY STENT INTERVENTION (PCI-S);  Surgeon: Burnell Blanks, MD;  Location: [facility] CATH LAB;  Service: Cardiovascular;  Laterality: N/A;    Family History  Problem Relation Age of Onset  . Memory loss Father   . Coronary artery disease Father 31    cabg  . Colon cancer Neg Hx     No Known Allergies  Current Outpatient Prescriptions on File Prior to Visit  Medication Sig Dispense Refill  . aspirin 325 MG tablet Take 325 mg by mouth daily.    Marland Kitchen levothyroxine (SYNTHROID, LEVOTHROID) 125 MCG tablet TAKE ONE TABLET BY MOUTH ONCE DAILY 90 tablet 0  . metoprolol tartrate (LOPRESSOR) 25 MG tablet HALF TABLET BY MOUTH TWICE A DAY 30 tablet 6  . simvastatin (ZOCOR) 40 MG tablet TAKE ONE TABLET BY MOUTH AT BEDTIME 90 tablet 1   No current facility-administered medications on file prior to visit.    BP 134/88 mmHg  Pulse 61  Temp(Src) 97.8 F (36.6 C) (Oral)  Resp 20  Ht 5\' 7"  (1.702 m)  Wt 221 lb (100.245 kg)  BMI 34.61 kg/m2  SpO2 97%     Review of Systems  Constitutional: Negative for fever, chills, activity change, appetite change and fatigue.  HENT: Negative for congestion, dental problem, ear pain,  hearing loss, mouth sores, rhinorrhea, sinus pressure, sneezing, tinnitus, trouble swallowing and voice change.   Eyes: Negative for photophobia, pain, redness and visual disturbance.  Respiratory: Negative for apnea, cough, choking, chest tightness, shortness of breath and wheezing.   Cardiovascular: Negative for chest pain, palpitations and leg swelling.  Gastrointestinal: Negative for nausea, vomiting, abdominal pain, diarrhea, constipation, blood in stool, abdominal distention, anal bleeding and rectal pain.  Genitourinary: Negative for dysuria, urgency, frequency (nocturia x2), hematuria, flank pain, decreased urine volume, discharge, penile swelling, scrotal swelling, difficulty urinating, genital sores and testicular pain.  Musculoskeletal: Negative for arthralgias, back pain, gait problem, joint swelling, myalgias, neck pain and neck stiffness.  Skin: Negative for color change, rash and wound.  Neurological: Negative for dizziness, tremors, seizures, syncope, facial asymmetry, speech difficulty, weakness, light-headedness, numbness and headaches.  Hematological: Negative for adenopathy. Does not bruise/bleed easily.  Psychiatric/Behavioral: Negative for suicidal ideas, hallucinations, behavioral problems, confusion, sleep disturbance, self-injury, dysphoric mood, decreased concentration and agitation. The patient is not nervous/anxious.        Objective:   Physical Exam  Constitutional: He appears well-developed and well-nourished.  HENT:  Head: Normocephalic and atraumatic.  Right Ear: External ear normal.  Left Ear: External ear normal.  Nose: Nose normal.  Mouth/Throat: Oropharynx is clear and moist.  Eyes: Conjunctivae and EOM are normal. Pupils are equal, round, and reactive to light. No scleral icterus.  Neck: Normal range of motion. Neck supple. No JVD present. No thyromegaly present.  Cardiovascular: Regular rhythm, normal heart sounds and intact distal pulses.  Exam reveals  no gallop and no friction rub.   No murmur heard. Dorsalis pedis pulses faint posterior tibial pulses full  Pulmonary/Chest: Effort normal and breath sounds normal. He exhibits no tenderness.  Abdominal: Soft. Bowel sounds are normal. He exhibits no distension and no mass. There is no tenderness.  Genitourinary: Penis normal. Guaiac negative stool.  Prostate plus 2  Musculoskeletal: Normal range of motion. He exhibits no edema and no tenderness.  Lymphadenopathy:    He has no cervical adenopathy.  Neurological: He is alert. He has normal reflexes. No cranial nerve deficit. Coordination normal.  Skin: Skin is warm and dry. No rash noted.  Psychiatric: He has a normal mood and affect. His behavior is normal.          Assessment & Plan:  Preventive health exam Coronary artery disease status post CABG Impaired  glucose tolerance stable Hypothyroidism  Dyslipidemia.  Will discontinue simvastatin and switch to atorvastatin 80    Weight loss, more vigorous exercise regimen.  Encouraged   Review of Systems As above    Objective:   Physical Exam  As above      Assessment & Plan:   Preventive health exam Coronary artery disease, stable Impaired glucose tolerance stable Dyslipidemia.  Switch to atorvastatin 80

## 2015-04-25 NOTE — Patient Instructions (Signed)
Limit your sodium (Salt) intake    It is important that you exercise regularly, at least 20 minutes 3 to 4 times per week.  If you develop chest pain or shortness of breath seek  medical attention.  You need to lose weight.  Consider a lower calorie diet and regular exercise.  Health Maintenance A healthy lifestyle and preventative care can promote health and wellness.  Maintain regular health, dental, and eye exams.  Eat a healthy diet. Foods like vegetables, fruits, whole grains, low-fat dairy products, and lean protein foods contain the nutrients you need and are low in calories. Decrease your intake of foods high in solid fats, added sugars, and salt. Get information about a proper diet from your health care provider, if necessary.  Regular physical exercise is one of the most important things you can do for your health. Most adults should get at least 150 minutes of moderate-intensity exercise (any activity that increases your heart rate and causes you to sweat) each week. In addition, most adults need muscle-strengthening exercises on 2 or more days a week.   Maintain a healthy weight. The body mass index (BMI) is a screening tool to identify possible weight problems. It provides an estimate of body fat based on height and weight. Your health care provider can find your BMI and can help you achieve or maintain a healthy weight. For males 20 years and older:  A BMI below 18.5 is considered underweight.  A BMI of 18.5 to 24.9 is normal.  A BMI of 25 to 29.9 is considered overweight.  A BMI of 30 and above is considered obese.  Maintain normal blood lipids and cholesterol by exercising and minimizing your intake of saturated fat. Eat a balanced diet with plenty of fruits and vegetables. Blood tests for lipids and cholesterol should begin at age 45 and be repeated every 5 years. If your lipid or cholesterol levels are high, you are over age 53, or you are at high risk for heart disease,  you may need your cholesterol levels checked more frequently.Ongoing high lipid and cholesterol levels should be treated with medicines if diet and exercise are not working.  If you smoke, find out from your health care provider how to quit. If you do not use tobacco, do not start.  Lung cancer screening is recommended for adults aged 73-80 years who are at high risk for developing lung cancer because of a history of smoking. A yearly low-dose CT scan of the lungs is recommended for people who have at least a 30-pack-year history of smoking and are current smokers or have quit within the past 15 years. A pack year of smoking is smoking an average of 1 pack of cigarettes a day for 1 year (for example, a 30-pack-year history of smoking could mean smoking 1 pack a day for 30 years or 2 packs a day for 15 years). Yearly screening should continue until the smoker has stopped smoking for at least 15 years. Yearly screening should be stopped for people who develop a health problem that would prevent them from having lung cancer treatment.  If you choose to drink alcohol, do not have more than 2 drinks per day. One drink is considered to be 12 oz (360 mL) of beer, 5 oz (150 mL) of wine, or 1.5 oz (45 mL) of liquor.  Avoid the use of street drugs. Do not share needles with anyone. Ask for help if you need support or instructions about stopping the use  of drugs.  High blood pressure causes heart disease and increases the risk of stroke. Blood pressure should be checked at least every 1-2 years. Ongoing high blood pressure should be treated with medicines if weight loss and exercise are not effective.  If you are 45-50 years old, ask your health care provider if you should take aspirin to prevent heart disease.  Diabetes screening involves taking a blood sample to check your fasting blood sugar level. This should be done once every 3 years after age 25 if you are at a normal weight and without risk factors for  diabetes. Testing should be considered at a younger age or be carried out more frequently if you are overweight and have at least 1 risk factor for diabetes.  Colorectal cancer can be detected and often prevented. Most routine colorectal cancer screening begins at the age of 81 and continues through age 88. However, your health care provider may recommend screening at an earlier age if you have risk factors for colon cancer. On a yearly basis, your health care provider may provide home test kits to check for hidden blood in the stool. A small camera at the end of a tube may be used to directly examine the colon (sigmoidoscopy or colonoscopy) to detect the earliest forms of colorectal cancer. Talk to your health care provider about this at age 59 when routine screening begins. A direct exam of the colon should be repeated every 5-10 years through age 74, unless early forms of precancerous polyps or small growths are found.  People who are at an increased risk for hepatitis B should be screened for this virus. You are considered at high risk for hepatitis B if:  You were born in a country where hepatitis B occurs often. Talk with your health care provider about which countries are considered high risk.  Your parents were born in a high-risk country and you have not received a shot to protect against hepatitis B (hepatitis B vaccine).  You have HIV or AIDS.  You use needles to inject street drugs.  You live with, or have sex with, someone who has hepatitis B.  You are a man who has sex with other men (MSM).  You get hemodialysis treatment.  You take certain medicines for conditions like cancer, organ transplantation, and autoimmune conditions.  Hepatitis C blood testing is recommended for all people born from [birth year] through 1967 and any individual with known risk factors for hepatitis C.  Healthy men should no longer receive prostate-specific antigen (PSA) blood tests as part of routine cancer  screening. Talk to your health care provider about prostate cancer screening.  Testicular cancer screening is not recommended for adolescents or adult males who have no symptoms. Screening includes self-exam, a health care provider exam, and other screening tests. Consult with your health care provider about any symptoms you have or any concerns you have about testicular cancer.  Practice safe sex. Use condoms and avoid high-risk sexual practices to reduce the spread of sexually transmitted infections (STIs).  You should be screened for STIs, including gonorrhea and chlamydia if:  You are sexually active and are younger than 24 years.  You are older than 24 years, and your health care provider tells you that you are at risk for this type of infection.  Your sexual activity has changed since you were last screened, and you are at an increased risk for chlamydia or gonorrhea. Ask your health care provider if you are at risk.  If you are at risk of being infected with HIV, it is recommended that you take a prescription medicine daily to prevent HIV infection. This is called pre-exposure prophylaxis (PrEP). You are considered at risk if:  You are a man who has sex with other men (MSM).  You are a heterosexual man who is sexually active with multiple partners.  You take drugs by injection.  You are sexually active with a partner who has HIV.  Talk with your health care provider about whether you are at high risk of being infected with HIV. If you choose to begin PrEP, you should first be tested for HIV. You should then be tested every 3 months for as long as you are taking PrEP.  Use sunscreen. Apply sunscreen liberally and repeatedly throughout the day. You should seek shade when your shadow is shorter than you. Protect yourself by wearing long sleeves, pants, a wide-brimmed hat, and sunglasses year round whenever you are outdoors.  Tell your health care provider of new moles or changes in  moles, especially if there is a change in shape or color. Also, tell your health care provider if a mole is larger than the size of a pencil eraser.  A one-time screening for abdominal aortic aneurysm (AAA) and surgical repair of large AAAs by ultrasound is recommended for men aged 56-75 years who are current or former smokers.  Stay current with your vaccines (immunizations). Document Released: 05/16/2008 Document Revised: 11/23/2013 Document Reviewed: 04/15/2011 [facility] Patient Information 2015 [place], [place]. This information is not intended to replace advice given to you by your health care provider. Make sure you discuss any questions you have with your health care provider.

## 2015-09-26 DIAGNOSIS — H524 Presbyopia: Secondary | ICD-10-CM | POA: Diagnosis not present

## 2015-09-26 DIAGNOSIS — R69 Illness, unspecified: Secondary | ICD-10-CM | POA: Diagnosis not present

## 2015-10-03 ENCOUNTER — Ambulatory Visit (INDEPENDENT_AMBULATORY_CARE_PROVIDER_SITE_OTHER): Payer: Medicare HMO

## 2015-10-03 DIAGNOSIS — Z23 Encounter for immunization: Secondary | ICD-10-CM

## 2016-03-29 DIAGNOSIS — L280 Lichen simplex chronicus: Secondary | ICD-10-CM | POA: Diagnosis not present

## 2016-03-29 DIAGNOSIS — L570 Actinic keratosis: Secondary | ICD-10-CM | POA: Diagnosis not present

## 2016-03-29 DIAGNOSIS — X32XXXD Exposure to sunlight, subsequent encounter: Secondary | ICD-10-CM | POA: Diagnosis not present

## 2016-04-16 ENCOUNTER — Other Ambulatory Visit: Payer: Self-pay | Admitting: Cardiology

## 2016-04-16 NOTE — Telephone Encounter (Signed)
REFILL 

## 2016-05-04 ENCOUNTER — Telehealth: Payer: Self-pay

## 2016-05-04 NOTE — Telephone Encounter (Signed)
Patient is on the list for [employer] 2017 and may be a good candidate for an AWV in 2017. Please let me know if/when appt is scheduled.   

## 2016-05-09 NOTE — Telephone Encounter (Signed)
This patient is seen by Dr. Sharren Bridge q year for AWV; will check tomorrow with him for fup preferences for AWV

## 2016-05-30 DIAGNOSIS — E039 Hypothyroidism, unspecified: Secondary | ICD-10-CM | POA: Diagnosis not present

## 2016-05-30 DIAGNOSIS — Z0000 Encounter for general adult medical examination without abnormal findings: Secondary | ICD-10-CM | POA: Diagnosis not present

## 2016-05-30 DIAGNOSIS — E785 Hyperlipidemia, unspecified: Secondary | ICD-10-CM | POA: Diagnosis not present

## 2016-06-25 ENCOUNTER — Other Ambulatory Visit: Payer: Self-pay | Admitting: Cardiology

## 2016-06-25 NOTE — Telephone Encounter (Signed)
REFILL 

## 2016-07-29 ENCOUNTER — Other Ambulatory Visit: Payer: Self-pay | Admitting: Internal Medicine

## 2016-07-29 NOTE — Telephone Encounter (Signed)
Rx refill sent to pharmacy. 

## 2016-08-06 ENCOUNTER — Other Ambulatory Visit (INDEPENDENT_AMBULATORY_CARE_PROVIDER_SITE_OTHER): Payer: Medicare HMO

## 2016-08-06 DIAGNOSIS — Z0000 Encounter for general adult medical examination without abnormal findings: Secondary | ICD-10-CM | POA: Diagnosis not present

## 2016-08-06 LAB — CBC WITH DIFFERENTIAL/PLATELET
BASOS PCT: 0.3 % (ref 0.0–3.0)
Basophils Absolute: 0 10*3/uL (ref 0.0–0.1)
EOS PCT: 1.3 % (ref 0.0–5.0)
Eosinophils Absolute: 0.1 10*3/uL (ref 0.0–0.7)
HEMATOCRIT: 44.9 % (ref 39.0–52.0)
Hemoglobin: 15.5 g/dL (ref 13.0–17.0)
LYMPHS ABS: 1.7 10*3/uL (ref 0.7–4.0)
LYMPHS PCT: 27.2 % (ref 12.0–46.0)
MCHC: 34.5 g/dL (ref 30.0–36.0)
MCV: 89.9 fl (ref 78.0–100.0)
MONOS PCT: 7.4 % (ref 3.0–12.0)
Monocytes Absolute: 0.5 10*3/uL (ref 0.1–1.0)
NEUTROS ABS: 3.9 10*3/uL (ref 1.4–7.7)
NEUTROS PCT: 63.8 % (ref 43.0–77.0)
PLATELETS: 156 10*3/uL (ref 150.0–400.0)
RBC: 5 Mil/uL (ref 4.22–5.81)
RDW: 12.9 % (ref 11.5–15.5)
WBC: 6.1 10*3/uL (ref 4.0–10.5)

## 2016-08-06 LAB — POC URINALSYSI DIPSTICK (AUTOMATED)
Bilirubin, UA: NEGATIVE
GLUCOSE UA: NEGATIVE
Ketones, UA: NEGATIVE
Leukocytes, UA: NEGATIVE
NITRITE UA: NEGATIVE
PROTEIN UA: NEGATIVE
SPEC GRAV UA: 1.02
UROBILINOGEN UA: 0.2
pH, UA: 5.5

## 2016-08-06 LAB — LIPID PANEL
CHOLESTEROL: 164 mg/dL (ref 0–200)
HDL: 31.2 mg/dL — AB (ref >39.00)
LDL Cholesterol: 111 mg/dL — ABNORMAL HIGH (ref 0–99)
NONHDL: 132.89
Total CHOL/HDL Ratio: 5
Triglycerides: 110 mg/dL (ref 0.0–149.0)
VLDL: 22 mg/dL (ref 0.0–40.0)

## 2016-08-06 LAB — BASIC METABOLIC PANEL
BUN: 10 mg/dL (ref 6–23)
CHLORIDE: 106 meq/L (ref 96–112)
CO2: 28 mEq/L (ref 19–32)
Calcium: 8.9 mg/dL (ref 8.4–10.5)
Creatinine, Ser: 1.12 mg/dL (ref 0.40–1.50)
GFR: 68.51 mL/min (ref >60.00)
Glucose, Bld: 143 mg/dL — ABNORMAL HIGH (ref 70–99)
POTASSIUM: 4.5 meq/L (ref 3.5–5.1)
SODIUM: 140 meq/L (ref 135–145)

## 2016-08-06 LAB — HEPATIC FUNCTION PANEL
ALBUMIN: 3.9 g/dL (ref 3.5–5.2)
ALT: 30 U/L (ref 0–53)
AST: 22 U/L (ref 0–37)
Alkaline Phosphatase: 68 U/L (ref 39–117)
Bilirubin, Direct: 0.1 mg/dL (ref 0.0–0.3)
TOTAL PROTEIN: 6.6 g/dL (ref 6.0–8.3)
Total Bilirubin: 0.6 mg/dL (ref 0.2–1.2)

## 2016-08-06 LAB — PSA: PSA: 1.24 ng/mL (ref 0.10–4.00)

## 2016-08-06 LAB — TSH: TSH: 2.66 u[IU]/mL (ref 0.35–4.50)

## 2016-08-12 ENCOUNTER — Encounter: Payer: Self-pay | Admitting: Internal Medicine

## 2016-08-12 ENCOUNTER — Ambulatory Visit (INDEPENDENT_AMBULATORY_CARE_PROVIDER_SITE_OTHER): Payer: Medicare HMO | Admitting: Internal Medicine

## 2016-08-12 DIAGNOSIS — E038 Other specified hypothyroidism: Secondary | ICD-10-CM

## 2016-08-12 DIAGNOSIS — E119 Type 2 diabetes mellitus without complications: Secondary | ICD-10-CM | POA: Diagnosis not present

## 2016-08-12 DIAGNOSIS — E034 Atrophy of thyroid (acquired): Secondary | ICD-10-CM

## 2016-08-12 DIAGNOSIS — Z0000 Encounter for general adult medical examination without abnormal findings: Secondary | ICD-10-CM | POA: Diagnosis not present

## 2016-08-12 DIAGNOSIS — I2510 Atherosclerotic heart disease of native coronary artery without angina pectoris: Secondary | ICD-10-CM

## 2016-08-12 DIAGNOSIS — E785 Hyperlipidemia, unspecified: Secondary | ICD-10-CM | POA: Diagnosis not present

## 2016-08-12 DIAGNOSIS — E1159 Type 2 diabetes mellitus with other circulatory complications: Secondary | ICD-10-CM | POA: Insufficient documentation

## 2016-08-12 DIAGNOSIS — E118 Type 2 diabetes mellitus with unspecified complications: Secondary | ICD-10-CM | POA: Insufficient documentation

## 2016-08-12 LAB — POCT GLYCOSYLATED HEMOGLOBIN (HGB A1C): HEMOGLOBIN A1C: 7.4

## 2016-08-12 MED ORDER — METFORMIN HCL ER (OSM) 500 MG PO TB24: 1000.0000 mg | ORAL_TABLET | Freq: Every day | ORAL | 6 refills | Status: DC

## 2016-08-12 NOTE — Addendum Note (Signed)
Addended by: Marian Sorrow on: 08/12/2016 05:28 PM   Modules accepted: Orders

## 2016-08-12 NOTE — Progress Notes (Signed)
   Subjective:    Patient ID: Adrian Gamble, male    DOB: Apr 09, 1946, 70 y.o.   MRN: [id number]  HPI  Wt Readings from Last 3 Encounters:  08/12/16 221 lb 4 oz (100.4 kg)  04/25/15 221 lb (100.2 kg)  01/03/15 224 lb (101.6 kg)    Review of Systems As above    Objective:   Physical Exam  As above      Assessment & Plan:   As above

## 2016-08-12 NOTE — Patient Instructions (Addendum)
Limit your sodium (Salt) intake    It is important that you exercise regularly, at least 20 minutes 3 to 4 times per week.  If you develop chest pain or shortness of breath seek  medical attention.  You need to lose weight.  Consider a lower calorie diet and regular exercise.   Please check your hemoglobin A1c every 3 months Blood Glucose Monitoring, Adult Monitoring your blood glucose (also know as blood sugar) helps you to manage your diabetes. It also helps you and your health care provider monitor your diabetes and determine how well your treatment plan is working. WHY SHOULD YOU MONITOR YOUR BLOOD GLUCOSE?  It can help you understand how food, exercise, and medicine affect your blood glucose.  It allows you to know what your blood glucose is at any given moment. You can quickly tell if you are having low blood glucose (hypoglycemia) or high blood glucose (hyperglycemia).  It can help you and your health care provider know how to adjust your medicines.  It can help you understand how to manage an illness or adjust medicine for exercise. WHEN SHOULD YOU TEST? Your health care provider will help you decide how often you should check your blood glucose. This may depend on the type of diabetes you have, your diabetes control, or the types of medicines you are taking. Be sure to write down all of your blood glucose readings so that this information can be reviewed with your health care provider. See below for examples of testing times that your health care provider may suggest. Type 1 Diabetes  Test at least 2 times per day if your diabetes is well controlled, if you are using an insulin pump, or if you perform multiple daily injections.  If your diabetes is not well controlled or if you are sick, you may need to test more often.  It is a good idea to also test:  Before every insulin injection.  Before and after exercise.  Between meals and 2 hours after a meal.  Occasionally between  2:00 a.m. and 3:00 a.m. Type 2 Diabetes  If you are taking insulin, test at least 2 times per day. However, it is best to test before every insulin injection.  If you take medicines by mouth (orally), test 2 times a day.  If you are on a controlled diet, test once a day.  If your diabetes is not well controlled or if you are sick, you may need to monitor more often. HOW TO MONITOR YOUR BLOOD GLUCOSE Supplies Needed  Blood glucose meter.  Test strips for your meter. Each meter has its own strips. You must use the strips that go with your own meter.  A pricking needle (lancet).  A device that holds the lancet (lancing device).  A journal or log book to write down your results. Procedure  Wash your hands with soap and water. Alcohol is not preferred.  Prick the side of your finger (not the tip) with the lancet.  Gently milk the finger until a small drop of blood appears.  Follow the instructions that come with your meter for inserting the test strip, applying blood to the strip, and using your blood glucose meter. Other Areas to Get Blood for Testing Some meters allow you to use other areas of your body (other than your finger) to test your blood. These areas are called alternative sites. The most common alternative sites are:  The forearm.  The thigh.  The back area of the  lower leg.  The palm of the hand. The blood flow in these areas is slower. Therefore, the blood glucose values you get may be delayed, and the numbers are different from what you would get from your fingers. Do not use alternative sites if you think you are having hypoglycemia. Your reading will not be accurate. Always use a finger if you are having hypoglycemia. Also, if you cannot feel your lows (hypoglycemia unawareness), always use your fingers for your blood glucose checks. ADDITIONAL TIPS FOR GLUCOSE MONITORING  Do not reuse lancets.  Always carry your supplies with you.  All blood glucose meters  have a 24-hour "hotline" number to call if you have questions or need help.  Adjust (calibrate) your blood glucose meter with a control solution after finishing a few boxes of strips. BLOOD GLUCOSE RECORD KEEPING It is a good idea to keep a daily record or log of your blood glucose readings. Most glucose meters, if not all, keep your glucose records stored in the meter. Some meters come with the ability to download your records to your home computer. Keeping a record of your blood glucose readings is especially helpful if you are wanting to look for patterns. Make notes to go along with the blood glucose readings because you might forget what happened at that exact time. Keeping good records helps you and your health care provider to work together to achieve good diabetes management.    This information is not intended to replace advice given to you by your health care provider. Make sure you discuss any questions you have with your health care provider.   Document Released: 11/21/2003 Document Revised: 12/09/2014 Document Reviewed: 04/12/2013 Elsevier Interactive Patient Education 2016 [employer].  Diabetes and Exercise Exercising regularly is important. It is not just about losing weight. It has many health benefits, such as:  Improving your overall fitness, flexibility, and endurance.  Increasing your bone density.  Helping with weight control.  Decreasing your body fat.  Increasing your muscle strength.  Reducing stress and tension.  Improving your overall health. People with diabetes who exercise gain additional benefits because exercise:  Reduces appetite.  Improves the body's use of blood sugar (glucose).  Helps lower or control blood glucose.  Decreases blood pressure.  Helps control blood lipids (such as cholesterol and triglycerides).  Improves the body's use of the hormone insulin by:  Increasing the body's insulin sensitivity.  Reducing the body's insulin  needs.  Decreases the risk for heart disease because exercising:  Lowers cholesterol and triglycerides levels.  Increases the levels of good cholesterol (such as high-density lipoproteins [HDL]) in the body.  Lowers blood glucose levels. YOUR ACTIVITY PLAN  Choose an activity that you enjoy, and set realistic goals. To exercise safely, you should begin practicing any new physical activity slowly, and gradually increase the intensity of the exercise over time. Your health care provider or diabetes educator can help create an activity plan that works for you. General recommendations include:  Encouraging children to engage in at least 60 minutes of physical activity each day.  Stretching and performing strength training exercises, such as yoga or weight lifting, at least 2 times per week.  Performing a total of at least 150 minutes of moderate-intensity exercise each week, such as brisk walking or water aerobics.  Exercising at least 3 days per week, making sure you allow no more than 2 consecutive days to pass without exercising.  Avoiding long periods of inactivity (90 minutes or more). When  you have to spend an extended period of time sitting down, take frequent breaks to walk or stretch. RECOMMENDATIONS FOR EXERCISING WITH TYPE 1 OR TYPE 2 DIABETES   Check your blood glucose before exercising. If blood glucose levels are greater than 240 mg/dL, check for urine ketones. Do not exercise if ketones are present.  Avoid injecting insulin into areas of the body that are going to be exercised. For example, avoid injecting insulin into:  The arms when playing tennis.  The legs when jogging.  Keep a record of:  Food intake before and after you exercise.  Expected peak times of insulin action.  Blood glucose levels before and after you exercise.  The type and amount of exercise you have done.  Review your records with your health care provider. Your health care provider will help you  to develop guidelines for adjusting food intake and insulin amounts before and after exercising.  If you take insulin or oral hypoglycemic agents, watch for signs and symptoms of hypoglycemia. They include:  Dizziness.  Shaking.  Sweating.  Chills.  Confusion.  Drink plenty of water while you exercise to prevent dehydration or heat stroke. Body water is lost during exercise and must be replaced.  Talk to your health care provider before starting an exercise program to make sure it is safe for you. Remember, almost any type of activity is better than none.   This information is not intended to replace advice given to you by your health care provider. Make sure you discuss any questions you have with your health care provider.   Document Released: 02/08/2004 Document Revised: 04/04/2015 Document Reviewed: 04/27/2013 Elsevier Interactive Patient Education 2016 [employer]. Diabetes Mellitus and Food It is important for you to manage your blood sugar (glucose) level. Your blood glucose level can be greatly affected by what you eat. Eating healthier foods in the appropriate amounts throughout the day at about the same time each day will help you control your blood glucose level. It can also help slow or prevent worsening of your diabetes mellitus. Healthy eating may even help you improve the level of your blood pressure and reach or maintain a healthy weight.  General recommendations for healthful eating and cooking habits include:  Eating meals and snacks regularly. Avoid going long periods of time without eating to lose weight.  Eating a diet that consists mainly of plant-based foods, such as fruits, vegetables, nuts, legumes, and whole grains.  Using low-heat cooking methods, such as baking, instead of high-heat cooking methods, such as deep frying. Work with your dietitian to make sure you understand how to use the Nutrition Facts information on food labels. HOW CAN FOOD AFFECT  ME? Carbohydrates Carbohydrates affect your blood glucose level more than any other type of food. Your dietitian will help you determine how many carbohydrates to eat at each meal and teach you how to count carbohydrates. Counting carbohydrates is important to keep your blood glucose at a healthy level, especially if you are using insulin or taking certain medicines for diabetes mellitus. Alcohol Alcohol can cause sudden decreases in blood glucose (hypoglycemia), especially if you use insulin or take certain medicines for diabetes mellitus. Hypoglycemia can be a life-threatening condition. Symptoms of hypoglycemia (sleepiness, dizziness, and disorientation) are similar to symptoms of having too much alcohol.  If your health care provider has given you approval to drink alcohol, do so in moderation and use the following guidelines:  Women should not have more than one drink per day,  and men should not have more than two drinks per day. One drink is equal to:  12 oz of beer.  5 oz of wine.  1 oz of hard liquor.  Do not drink on an empty stomach.  Keep yourself hydrated. Have water, diet soda, or unsweetened iced tea.  Regular soda, juice, and other mixers might contain a lot of carbohydrates and should be counted. WHAT FOODS ARE NOT RECOMMENDED? As you make food choices, it is important to remember that all foods are not the same. Some foods have fewer nutrients per serving than other foods, even though they might have the same number of calories or carbohydrates. It is difficult to get your body what it needs when you eat foods with fewer nutrients. Examples of foods that you should avoid that are high in calories and carbohydrates but low in nutrients include:  Trans fats (most processed foods list trans fats on the Nutrition Facts label).  Regular soda.  Juice.  Candy.  Sweets, such as cake, pie, doughnuts, and cookies.  Fried foods. WHAT FOODS CAN I EAT? Eat nutrient-rich foods,  which will nourish your body and keep you healthy. The food you should eat also will depend on several factors, including:  The calories you need.  The medicines you take.  Your weight.  Your blood glucose level.  Your blood pressure level.  Your cholesterol level. You should eat a variety of foods, including:  Protein.  Lean cuts of meat.  Proteins low in saturated fats, such as fish, egg whites, and beans. Avoid processed meats.  Fruits and vegetables.  Fruits and vegetables that may help control blood glucose levels, such as apples, mangoes, and yams.  Dairy products.  Choose fat-free or low-fat dairy products, such as milk, yogurt, and cheese.  Grains, bread, pasta, and rice.  Choose whole grain products, such as multigrain bread, whole oats, and brown rice. These foods may help control blood pressure.  Fats.  Foods containing healthful fats, such as nuts, avocado, olive oil, canola oil, and fish. DOES EVERYONE WITH DIABETES MELLITUS HAVE THE SAME MEAL PLAN? Because every person with diabetes mellitus is different, there is not one meal plan that works for everyone. It is very important that you meet with a dietitian who will help you create a meal plan that is just right for you.   This information is not intended to replace advice given to you by your health care provider. Make sure you discuss any questions you have with your health care provider.   Document Released: 08/15/2005 Document Revised: 12/09/2014 Document Reviewed: 10/15/2013 Elsevier Interactive Patient Education 2016 [employer] for Eating Away From Home If You Have Diabetes Controlling your level of blood glucose, also known as blood sugar, can be challenging. It can be even more difficult when you do not prepare your own meals. The following tips can help you manage your diabetes when you eat away from home. PLANNING AHEAD Plan ahead if you know you will be eating away from home:  Ask your  health care provider how to time meals and medicine if you are taking insulin.  Make a list of restaurants near you that offer healthy choices. If they have a carry-out menu, take it home and plan what you will order ahead of time.  Look up the restaurant you want to eat at online. Many chain and fast-food restaurants list nutritional information online. Use this information to choose the healthiest options and to calculate how many  carbohydrates will be in your meal.  Use a carbohydrate-counting book or mobile app to look up the carbohydrate content and serving size of the foods you want to eat.  Become familiar with serving sizes and learn to recognize how many servings are in a portion. This will allow you to estimate how many carbohydrates you can eat. FREE FOODS A "free food" is any food or drink that has less than 5 g of carbohydrates per serving. Free foods include:  Many vegetables.  Hard boiled eggs.  Nuts or seeds.  Olives.  Cheeses.  Meats. These types of foods make good appetizer choices and are often available at salad bars. Lemon juice, vinegar, or a low-calorie salad dressing of fewer than 20 calories per serving can be used as a "free" salad dressing.  CHOICES TO REDUCE CARBOHYDRATES  Substitute nonfat sweetened yogurt with a sugar-free yogurt. Yogurt made from soy milk may also be used, but you will still want a sugar-free or plain option to choose a lower carbohydrate amount.  Ask your server to take away the bread basket or chips from your table.  Order fresh fruit. A salad bar often offers fresh fruit choices. Avoid canned fruit because it is usually packed in sugar or syrup.  Order a salad, and eat it without dressing. Or, create a "free" salad dressing.  Ask for substitutions. For example, instead of [place] fries, request an order of a vegetable such as salad, green beans, or broccoli. OTHER TIPS   If you take insulin, take the insulin once your food arrives  to your table. This will ensure your insulin and food are timed correctly.  Ask your server about the portion size before your order, and ask for a take-out box if the portion has more servings than you should have. When your food comes, leave the amount you should have on the plate, and put the rest in the take-out box.  Consider splitting an entree with someone and ordering a side salad.   This information is not intended to replace advice given to you by your health care provider. Make sure you discuss any questions you have with your health care provider.   Document Released: 11/18/2005 Document Revised: 08/09/2015 Document Reviewed: 02/15/2014 Elsevier Interactive Patient Education [employer].

## 2016-08-12 NOTE — Progress Notes (Signed)
Pre visit review using our clinic review tool, if applicable. No additional management support is needed unless otherwise documented below in the visit note. 

## 2016-08-12 NOTE — Progress Notes (Signed)
Subjective:    Patient ID: Adrian Gamble, male    DOB: [birth year], 70 y.o.   MRN: [id number]  HPI    Subjective:    Patient ID: Adrian Gamble, male    DOB: 1946-02-25, 70 y.o.   MRN: [id number]  HPI 40 -year-old patient who is seen today for a preventive health examination.  He is status post CABG in July of 2013. He is doing quite well. Medical problems include  hypothyroidism and dyslipidemia.  He has a history of diet-controlled type 2 diabetes.  Last hemoglobin A1c approximately one year ago was 6.6.  Follow-up hemoglobin A1c today 7.4   Here for Medicare AWV:   1. Risk factors based on Past M, S, F history: patient has known coronary artery disease, status post stenting And CABG.  2. Physical Activities: no restrictions or limitations -active with golf, health club  3. Depression/mood: history depression, or mood disorder  4. Hearing: no auditory deficits  5. ADL's: completely active and independent in all aspects of daily living  6. Fall Risk: low  7. Home Safety: no problems identified  8. Height, weight, &visual acuity: increase in weight; no change in height. Her weight has had a recent eye examination and glaucoma check  9. Counseling: heart healthy diet or regular exercise encouraged as well as modest weight loss  10. Labs ordered based on risk factors: laboratory studies, including lipid profile reviewed. LDL approximately 100 Will intensify statin therapy  11. Referral Coordination- not appropriate at this time  12. Care Plan- heart healthy diet modest weight loss and exercise regimen encouraged  13. Cognitive Assessment- alert and oriented, with normal affect  14.  Preventive services will include colonoscopies at 10 year intervals.  He will be followed by cardiology and ophthalmology on an annual basis.  He'll be seen annually for a preventive health examination with screening lab 15.  Provider list includes primary care cardiology, ophthalmology.  Preventive  Screening-Counseling & Management  Alcohol-Tobacco  Smoking Status: current   Allergies (verified):  No Known Drug Allergies   Past History:  Past Medical History:    Coronary artery disease status post stenting 2001 Status post CABG July 2013  Hyperlipidemia  Peptic ulcer disease  patient admitted to the hospital for chest pain, and 2005, underwent heart catheterization and also had a negative Cardiolite stress test  GERD  testosterone deficiency  Hypothyroidism .   Type 2 diabetes    Past Surgical History:   Tonsillectomy  colonoscopy 2005  2015 Cardiolite stress test February 2009 Status post CABG July 2013  Family History:   father age 39 memory loss  mother, age 69  One brother deceased with hepatitis and half brother, coronary artery disease  one sister at 63, is well   Social History:   required.  Married  Regular exercise-no    Past Medical History:  Diagnosis Date  . CORONARY ARTERY DISEASE 09/01/2007   s/p MI - s/p PCI to RCA 2001;  [place] 7/13: 3v CAD, good LVF - >  s/p CABG 7/13 (L-LAD, S-OM1/dCFX, S-dRCA)  . Diverticulosis   . Early cataracts, bilateral   . GERD 09/01/2007  . H/O hiatal hernia   . History of gastric ulcer 44yrs ago  . HYPERLIPIDEMIA 09/01/2007   takes Simvastatin daily  . HYPOTHYROIDISM 05/29/2009   takes Synthroid daily  . Impaired glucose tolerance    A1C 6.8 in 06/2012  . MI, old   . Neck pain    pinched nerve  . Nocturia   .  PEPTIC ULCER DISEASE 09/01/2007  . Sleep apnea    pt denies  . TESTOSTERONE DEFICIENCY 09/01/2007    Social History   Social History  . Marital status: Married    Spouse name: N/A  . Number of children: N/A  . Years of education: N/A   Occupational History  . Retired    Social History Main Topics  . Smoking status: Former Smoker    Types: Pipe, Cigars    Quit date: 06/01/2012  . Smokeless tobacco: Never Used     Comment: patient states that he is not somking the pipe or cigars any more  .  Alcohol use Yes     Comment: rare  . Drug use: No  . Sexual activity: Yes   Other Topics Concern  . Not on file   Social History Narrative  . No narrative on file    Past Surgical History:  Procedure Laterality Date  . arm surgery  1957   d/t broken  . CARDIAC CATHETERIZATION  2005; 06/01/2012  . COLONOSCOPY  2005  . CORONARY ANGIOPLASTY WITH STENT PLACEMENT  2001   RCA-1 stent  . CORONARY ARTERY BYPASS GRAFT  06/09/2012   Procedure: CORONARY ARTERY BYPASS GRAFTING (CABG);  Surgeon: Grace Isaac, MD;  Location: [facility];  Service: Open Heart Surgery;  Laterality: N/A;  CABG x four;  using left internal mammary artery and right leg greater saphenous vein harvested endoscopically  . ESOPHAGOGASTRODUODENOSCOPY    . PERCUTANEOUS CORONARY STENT INTERVENTION (PCI-S) N/A 06/01/2012   Procedure: PERCUTANEOUS CORONARY STENT INTERVENTION (PCI-S);  Surgeon: Burnell Blanks, MD;  Location: [facility] CATH LAB;  Service: Cardiovascular;  Laterality: N/A;  . [place]   at age 20    Family History  Problem Relation Age of Onset  . Memory loss Father   . Coronary artery disease Father 63    cabg  . Colon cancer Neg Hx     No Known Allergies  Current Outpatient Prescriptions on File Prior to Visit  Medication Sig Dispense Refill  . aspirin 325 MG tablet Take 325 mg by mouth daily.    Marland Kitchen atorvastatin (LIPITOR) 80 MG tablet Take 1 tablet (80 mg total) by mouth daily. 90 tablet 3  . levothyroxine (SYNTHROID, LEVOTHROID) 125 MCG tablet TAKE ONE TABLET BY MOUTH ONCE DAILY 90 tablet 0  . metoprolol tartrate (LOPRESSOR) 25 MG tablet TAKE ONE-HALF TABLET BY MOUTH TWICE DAILY 30 tablet 0   No current facility-administered medications on file prior to visit.     BP 126/80 (BP Location: Left Arm, Patient Position: Sitting, Cuff Size: Normal)   Pulse 66   Temp 98.2 F (36.8 C) (Oral)   Resp 20   Ht 5\' 7"  (1.702 m)   Wt 221 lb 4 oz (100.4 kg)   SpO2 97%   BMI 34.65  kg/m      Review of Systems  Constitutional: Negative for fever, chills, activity change, appetite change and fatigue.  HENT: Negative for congestion, dental problem, ear pain, hearing loss, mouth sores, rhinorrhea, sinus pressure, sneezing, tinnitus, trouble swallowing and voice change.   Eyes: Negative for photophobia, pain, redness and visual disturbance.  Respiratory: Negative for apnea, cough, choking, chest tightness, shortness of breath and wheezing.   Cardiovascular: Negative for chest pain, palpitations and leg swelling.  Gastrointestinal: Negative for nausea, vomiting, abdominal pain, diarrhea, constipation, blood in stool, abdominal distention, anal bleeding and rectal pain.  Genitourinary: Negative for dysuria, urgency, frequency (nocturia x2), hematuria, flank  pain, decreased urine volume, discharge, penile swelling, scrotal swelling, difficulty urinating, genital sores and testicular pain.  Musculoskeletal: Negative for arthralgias, back pain, gait problem, joint swelling, myalgias, neck pain and neck stiffness.  Skin: Negative for color change, rash and wound.  Neurological: Negative for dizziness, tremors, seizures, syncope, facial asymmetry, speech difficulty, weakness, light-headedness, numbness and headaches.  Hematological: Negative for adenopathy. Does not bruise/bleed easily.  Psychiatric/Behavioral: Negative for suicidal ideas, hallucinations, behavioral problems, confusion, sleep disturbance, self-injury, dysphoric mood, decreased concentration and agitation. The patient is not nervous/anxious.        Objective:   Physical Exam  Constitutional: He appears well-developed and well-nourished.  HENT:  Head: Normocephalic and atraumatic.  Right Ear: External ear normal.  Left Ear: External ear normal.  Nose: Nose normal.  Mouth/Throat: Oropharynx is clear and moist.  Eyes: Conjunctivae and EOM are normal. Pupils are equal, round, and reactive to light. No scleral  icterus.  Neck: Normal range of motion. Neck supple. No JVD present. No thyromegaly present.  Cardiovascular: Regular rhythm, normal heart sounds and intact distal pulses.  Exam reveals no gallop and no friction rub.   No murmur heard. Dorsalis pedis pulses faint posterior tibial pulses full  Pulmonary/Chest: Effort normal and breath sounds normal. He exhibits no tenderness.  Abdominal: Soft. Bowel sounds are normal. He exhibits no distension and no mass. There is no tenderness.  Genitourinary: Penis normal. Guaiac negative stool.  Prostate plus 2  Musculoskeletal: Normal range of motion. He exhibits no edema and no tenderness.  Lymphadenopathy:    He has no cervical adenopathy.  Neurological: He is alert. He has normal reflexes. No cranial nerve deficit. Coordination normal.  Skin: Skin is warm and dry. No rash noted.  Psychiatric: He has a normal mood and affect. His behavior is normal.          Assessment & Plan:  Preventive health exam Coronary artery disease status post CABG Type 2 diabetes.  Formally, diet controlled.  Will place on metformin therapy.  Nonpharmacologic issues discussed at length.  Follow-up 3 months  Hypothyroidis.  TSH therapeutic   Dyslipidemia. Will continue atorvastatin 80   Weight loss, more vigorous exercise regimen.  Encouraged   Review of Systems  As above    Objective:   Physical Exam   As above      Assessment & Plan:   Preventive health exam Coronary artery disease, stable Type 2 diabetes.  Start metformin therapy.  Dietary instructions and general information concerning diabetes dispensed.  Recheck 3 months  Dyslipidemia. Continue atorvastatin  80  Follow-up 3 months  [name]

## 2016-08-15 ENCOUNTER — Telehealth: Payer: Self-pay

## 2016-08-15 NOTE — Telephone Encounter (Signed)
Called the patient for AWV. Was completed by  Dr.k this week Sh

## 2016-08-22 ENCOUNTER — Telehealth: Payer: Self-pay | Admitting: Internal Medicine

## 2016-08-22 DIAGNOSIS — E119 Type 2 diabetes mellitus without complications: Secondary | ICD-10-CM

## 2016-08-22 NOTE — Telephone Encounter (Signed)
Please see message and advise 

## 2016-08-22 NOTE — Telephone Encounter (Signed)
° °  Pt said metformin is causing him a lot of diarrhea. He is asking if this is something that will continue to happen. He is also having some abd pain and is asking if this may be related   Pt would like a call back

## 2016-08-22 NOTE — Telephone Encounter (Signed)
Called and discussed.  Patient will hold metformin for several days and 1 bowel habits normalize, we'll rechallenge with one tablet daily.  He will call the office next week.  If diarrhea recurs on lower dose  Please schedule consultation with dietary due to new onset diabetes

## 2016-08-22 NOTE — Telephone Encounter (Signed)
Left message on voicemail to call office. Let pt know Diabetic counseling has been set up and someone will call him to schedule.

## 2016-08-26 NOTE — Telephone Encounter (Signed)
Spoke to pt, told him order for referral to  Diabetic counseling has been set up and someone will call him to schedule. Pt verbalized understanding.

## 2016-08-29 ENCOUNTER — Other Ambulatory Visit: Payer: Self-pay | Admitting: Cardiology

## 2016-08-29 NOTE — Telephone Encounter (Signed)
Rx request sent to pharmacy.  

## 2016-09-02 ENCOUNTER — Telehealth: Payer: Self-pay | Admitting: Internal Medicine

## 2016-09-02 NOTE — Telephone Encounter (Signed)
° °  Pt sad Dr Raliegh Ip had mention to him that he could get a free kit to test his blood sugar and he called today to request a kit.

## 2016-09-03 NOTE — Telephone Encounter (Signed)
Left message on voicemail to call office. I have a Glucometer for him to pickup will be at the front desk.

## 2016-09-05 NOTE — Telephone Encounter (Signed)
Left message on mobile voicemail that I do have a Glucometer for you to check your sugars I will leave it at the front desk for you. Any questions please call office.

## 2016-09-10 ENCOUNTER — Telehealth: Payer: Self-pay | Admitting: Internal Medicine

## 2016-09-10 NOTE — Telephone Encounter (Addendum)
Pt would like to know time of day to check his blood sugar and   what his range should be. Pt just picked up meter this am.  Ok to leave message.

## 2016-09-10 NOTE — Telephone Encounter (Signed)
Left detailed message on voicemail, to check blood sugar first thing in the morning before you eat or drink anything so you are fasting. Reading should be 100 or below. If you want to continue checking your sugars you will need to contact your insurance to make sure you have the correct meter so I can order you more supplies. Any questions please call office.

## 2016-09-11 NOTE — Telephone Encounter (Signed)
Pt is calling donna back concerning glucometer. Pt is aware donna out of office until monday

## 2016-09-13 DIAGNOSIS — X32XXXD Exposure to sunlight, subsequent encounter: Secondary | ICD-10-CM | POA: Diagnosis not present

## 2016-09-13 DIAGNOSIS — L570 Actinic keratosis: Secondary | ICD-10-CM | POA: Diagnosis not present

## 2016-09-17 NOTE — Telephone Encounter (Signed)
Left message on voicemail to call office.  

## 2016-09-18 NOTE — Telephone Encounter (Signed)
Butch Penny pt returned your call

## 2016-09-20 NOTE — Telephone Encounter (Signed)
Left detailed message on voicemail that I was returning his call. If you have a question please call me back and have the scheduler get me so we can talk.

## 2016-09-23 DIAGNOSIS — R69 Illness, unspecified: Secondary | ICD-10-CM | POA: Diagnosis not present

## 2016-09-23 MED ORDER — GLUCOSE BLOOD VI STRP: ORAL_STRIP | 12 refills | Status: DC

## 2016-09-23 MED ORDER — ONETOUCH DELICA LANCETS FINE MISC: 12 refills | Status: DC

## 2016-09-23 NOTE — Telephone Encounter (Signed)
Pt called back earlier this morning and said that the meter I gave him is the correct one and he will need test strips and lancets. Told him okay I will send Rx's to the pharmacy as soon as we are back online. Pt verbalized understanding and also stated his sugars are running low 2 hours after eating in the 80's and 90's. Asked pt if having any symptoms of hypoglycemia? Pt stated no. Told pt to continue to monitor sugars and if they continue to be low need to make an appt. Pt verbalized understanding.

## 2016-10-01 ENCOUNTER — Ambulatory Visit (INDEPENDENT_AMBULATORY_CARE_PROVIDER_SITE_OTHER): Payer: Medicare HMO | Admitting: Internal Medicine

## 2016-10-01 ENCOUNTER — Encounter: Payer: Self-pay | Admitting: Internal Medicine

## 2016-10-01 VITALS — BP 120/76 | HR 69 | Temp 98.3°F | Resp 20 | Ht 67.0 in | Wt 208.0 lb

## 2016-10-01 DIAGNOSIS — B029 Zoster without complications: Secondary | ICD-10-CM | POA: Diagnosis not present

## 2016-10-01 DIAGNOSIS — E119 Type 2 diabetes mellitus without complications: Secondary | ICD-10-CM

## 2016-10-01 MED ORDER — METFORMIN HCL ER (OSM) 500 MG PO TB24: 500.0000 mg | ORAL_TABLET | Freq: Every day | ORAL | 6 refills | Status: DC

## 2016-10-01 NOTE — Progress Notes (Signed)
Subjective:    Patient ID: Adrian Gamble, male    DOB: [birth year], 70 y.o.   MRN: [id number]  HPI  70 year old patient who has a history of type 2 diabetes.  He presents with a 8-9 day history of a rash involving his right forehead and supraorbital area including the right upper lid.  He had some discomfort over the weekend which has resolved  .  No change in visual acuity  No prior history of shingles or shingles vaccine  He has a history of diabetes which has been well-controlled on metformin 500 mg once a day.  He had GI issues with 1000 mg daily.  Fasting blood sugars are often less than 100.  He is scheduled for follow-up in 2 months Past Medical History:  Diagnosis Date  . CORONARY ARTERY DISEASE 09/01/2007   s/p MI - s/p PCI to RCA 2001;  [place] 7/13: 3v CAD, good LVF - >  s/p CABG 7/13 (L-LAD, S-OM1/dCFX, S-dRCA)  . Diverticulosis   . Early cataracts, bilateral   . GERD 09/01/2007  . H/O hiatal hernia   . History of gastric ulcer 70yrs ago  . HYPERLIPIDEMIA 09/01/2007   takes Simvastatin daily  . HYPOTHYROIDISM 05/29/2009   takes Synthroid daily  . Impaired glucose tolerance    A1C 6.8 in 06/2012  . MI, old   . Neck pain    pinched nerve  . Nocturia   . PEPTIC ULCER DISEASE 09/01/2007  . Sleep apnea    pt denies  . TESTOSTERONE DEFICIENCY 09/01/2007     Social History   Social History  . Marital status: Married    Spouse name: N/A  . Number of children: N/A  . Years of education: N/A   Occupational History  . Retired    Social History Main Topics  . Smoking status: Former Smoker    Types: Pipe, Cigars    Quit date: 06/01/2012  . Smokeless tobacco: Never Used     Comment: patient states that he is not somking the pipe or cigars any more  . Alcohol use Yes     Comment: rare  . Drug use: No  . Sexual activity: Yes   Other Topics Concern  . Not on file   Social History Narrative  . No narrative on file    Past Surgical History:  Procedure Laterality Date  .  arm surgery  1957   d/t broken  . CARDIAC CATHETERIZATION  2005; 06/01/2012  . COLONOSCOPY  2005  . CORONARY ANGIOPLASTY WITH STENT PLACEMENT  2001   RCA-1 stent  . CORONARY ARTERY BYPASS GRAFT  06/09/2012   Procedure: CORONARY ARTERY BYPASS GRAFTING (CABG);  Surgeon: Grace Isaac, MD;  Location: [facility];  Service: Open Heart Surgery;  Laterality: N/A;  CABG x four;  using left internal mammary artery and right leg greater saphenous vein harvested endoscopically  . ESOPHAGOGASTRODUODENOSCOPY    . PERCUTANEOUS CORONARY STENT INTERVENTION (PCI-S) N/A 06/01/2012   Procedure: PERCUTANEOUS CORONARY STENT INTERVENTION (PCI-S);  Surgeon: Burnell Blanks, MD;  Location: [facility] CATH LAB;  Service: Cardiovascular;  Laterality: N/A;  . [place]   at age 17    Family History  Problem Relation Age of Onset  . Memory loss Father   . Coronary artery disease Father 23    cabg  . Colon cancer Neg Hx     No Known Allergies  Current Outpatient Prescriptions on File Prior to Visit  Medication Sig Dispense Refill  .  aspirin 325 MG tablet Take 325 mg by mouth daily.    Marland Kitchen atorvastatin (LIPITOR) 80 MG tablet Take 1 tablet (80 mg total) by mouth daily. 90 tablet 3  . glucose blood (ONETOUCH VERIO) test strip USE TO CHECK BLOOD SUGAR DAILY AND PRN 100 each 12  . levothyroxine (SYNTHROID, LEVOTHROID) 125 MCG tablet TAKE ONE TABLET BY MOUTH ONCE DAILY 90 tablet 0  . metformin (FORTAMET) 500 MG (OSM) 24 hr tablet Take 2 tablets (1,000 mg total) by mouth daily with breakfast. 180 tablet 6  . metoprolol tartrate (LOPRESSOR) 25 MG tablet Take 0.5 tablets (12.5 mg total) by mouth 2 (two) times daily. Please keep your upcoming appointment for refills. 30 tablet 1  . ONETOUCH DELICA LANCETS FINE MISC USE TO CHECK BLOOD SUGAR DAILY AND PRN 100 each 12   No current facility-administered medications on file prior to visit.     BP 120/76 (BP Location: Left Arm, Patient Position: Sitting,  Cuff Size: Normal)   Pulse 69   Temp 98.3 F (36.8 C) (Oral)   Resp 20   Ht 5\' 7"  (1.702 m)   Wt 208 lb (94.3 kg)   BMI 32.58 kg/m    Review of Systems  Constitutional: Negative for appetite change, chills, fatigue and fever.  HENT: Negative for congestion, dental problem, ear pain, hearing loss, sore throat, tinnitus, trouble swallowing and voice change.   Eyes: Negative for pain, discharge and visual disturbance.  Respiratory: Negative for cough, chest tightness, wheezing and stridor.   Cardiovascular: Negative for chest pain, palpitations and leg swelling.  Gastrointestinal: Negative for abdominal distention, abdominal pain, blood in stool, constipation, diarrhea, nausea and vomiting.  Genitourinary: Negative for difficulty urinating, discharge, flank pain, genital sores, hematuria and urgency.  Musculoskeletal: Negative for arthralgias, back pain, gait problem, joint swelling, myalgias and neck stiffness.  Skin: Positive for rash.  Neurological: Negative for dizziness, syncope, speech difficulty, weakness, numbness and headaches.  Hematological: Negative for adenopathy. Does not bruise/bleed easily.  Psychiatric/Behavioral: Negative for behavioral problems and dysphoric mood. The patient is not nervous/anxious.        Objective:   Physical Exam  Constitutional: He appears well-developed and well-nourished. No distress.  Skin:  Five 3-5 mm dry, crusted lesions involving the right fore- head area, right supra orbital area and a single lesion involving the right upper lid There is some mild edema involving the right upper lid Conjunctivae clear          Assessment & Plan:   Herpes zoster rash.  No apparent ocular involvement.  Lesions are dry and healing.  No further discomfort.  Patient made aware of the diagnosis and postherpetic neuralgia discussed.  He will report any new or worsening symptoms Diabetes mellitus.  Recheck in 2 months as scheduled.  Continue metformin  500 mg daily  [name]

## 2016-10-01 NOTE — Progress Notes (Signed)
Pre visit review using our clinic review tool, if applicable. No additional management support is needed unless otherwise documented below in the visit note. 

## 2016-10-01 NOTE — Patient Instructions (Addendum)
Call or return to clinic prn if these symptoms worsen or fail to improve as anticipated.  Postherpetic Neuralgia Postherpetic neuralgia (PHN) is nerve pain that occurs after a shingles infection. Shingles is a painful rash that appears on one side of the body, usually on your trunk or face. Shingles is caused by the varicella-zoster virus. This is the same virus that causes chickenpox. In people who have had chickenpox, the virus can resurface years later and cause shingles. You may have PHN if you continue to have pain for 3 months after your shingles rash has gone away. PHN appears in the same area where you had the shingles rash. For most people, PHN goes away within 1 year.  Getting a vaccination for shingles can prevent PHN. This vaccine is recommended for people older than 50. It may prevent shingles and may also lower your risk of PHN if you do get shingles. CAUSES PHN is caused by damage to your nerves from the varicella-zoster virus. This damage makes your nerves overly sensitive.  RISK FACTORS Aging is the biggest risk factor for developing PHN. Most people who get PHN are older than 60. Other risk factors include:  Having very bad pain before your shingles rash starts.  Having a very bad rash.  Having shingles in the nerve that supplies your face and eye (trigeminal nerve). SIGNS AND SYMPTOMS Pain is the main symptom of PHN. The pain is often very bad and may be described as stabbing, burning, or feeling like an electric shock. The pain may come and go or may be there all the time. Pain may be triggered by light touches on the skin or changes in temperature. You may have itching along with the pain. DIAGNOSIS  Your health care provider may diagnose PHN based on your symptoms and your history of shingles. Lab studies and other diagnostic tests are usually not needed. TREATMENT  There is no cure for PHN. Treatment for PHN will focus on pain relief. Over-the-counter pain relievers do not  usually relieve PHN pain. You may need to work with a pain specialist. Treatment may include:  Antidepressant medicines to help with pain and improve sleep.  Antiseizure medicines to relieve nerve pain.  Strong pain relievers (opioids).  A numbing patch worn on the skin (lidocaine patch). HOME CARE INSTRUCTIONS It may take a long time to recover from PHN. Work closely with your health care provider, and have a good support system at home.   Take all medicines as directed by your health care provider.  Wear loose, comfortable clothing.  Cover sensitive areas with a dressing to reduce friction from clothing rubbing on the area.  If cold does not make your pain worse, try applying a cool compress or cooling gel pack to the area.  Talk to your health care provider if you feel depressed or desperate. Living with long-term pain can be depressing. SEEK MEDICAL CARE IF:  Your medicine is not helping.  You are struggling to manage your pain at home.   This information is not intended to replace advice given to you by your health care provider. Make sure you discuss any questions you have with your health care provider.   Document Released: 02/08/2003 Document Revised: 12/09/2014 Document Reviewed: 11/09/2013 Elsevier Interactive Patient Education [employer].

## 2016-10-02 ENCOUNTER — Ambulatory Visit: Payer: Medicare HMO | Admitting: Cardiology

## 2016-10-18 ENCOUNTER — Other Ambulatory Visit: Payer: Self-pay | Admitting: Internal Medicine

## 2016-10-23 ENCOUNTER — Encounter: Payer: Self-pay | Admitting: *Deleted

## 2016-10-27 NOTE — Progress Notes (Signed)
HPI The patient presents for follow up of CAD.  He is status post remote PCI to his RCA in 2001, HL, DM2, hypothyroidism, GERD. Cardiac cath in  06/01/12 and demonstrated 3 vessel CAD with preserved LV function. CABG was recommended. He was taken off of Plavix and discharged to home and brought back on 06/09/12. He underwent CABG with Dr. Servando Snare with grafts including LIMA-LAD, SVG-OM1 and distal circumflex, SVG-distal RCA. Postop course was fairly uneventful.    Since I last saw him he has done well although he's been diagnosed with diabetes. He's changed his diet. He's not exercising routinely as he gets out of the routine. He denies any cardiovascular symptoms. The patient denies any new symptoms such as chest discomfort, neck or arm discomfort. There has been no new shortness of breath, PND or orthopnea. There have been no reported palpitations, presyncope or syncope.  As he changed his diet he has lost 20 pounds.   No Known Allergies  Current Outpatient Prescriptions  Medication Sig Dispense Refill  . aspirin EC 81 MG tablet Take 81 mg by mouth daily.    Marland Kitchen glucose blood (ONETOUCH VERIO) test strip USE TO CHECK BLOOD SUGAR DAILY AND PRN 100 each 12  . levothyroxine (SYNTHROID, LEVOTHROID) 125 MCG tablet TAKE ONE TABLET BY MOUTH ONCE DAILY 90 tablet 1  . metformin (FORTAMET) 500 MG (OSM) 24 hr tablet Take 1 tablet (500 mg total) by mouth daily with breakfast. (Patient taking differently: Take 250 mg by mouth daily with breakfast. ) 180 tablet 6  . metoprolol tartrate (LOPRESSOR) 25 MG tablet Take 0.5 tablets (12.5 mg total) by mouth 2 (two) times daily. Please keep your upcoming appointment for refills. 30 tablet 1  . ONETOUCH DELICA LANCETS FINE MISC USE TO CHECK BLOOD SUGAR DAILY AND PRN 100 each 12  . rosuvastatin (CRESTOR) 40 MG tablet Take 1 tablet (40 mg total) by mouth daily. 90 tablet 3   No current facility-administered medications for this visit.     Past Medical History:    Diagnosis Date  . CORONARY ARTERY DISEASE 09/01/2007   s/p MI - s/p PCI to RCA 2001;  [place] 7/13: 3v CAD, good LVF - >  s/p CABG 7/13 (L-LAD, S-OM1/dCFX, S-dRCA)  . Diverticulosis   . Early cataracts, bilateral   . GERD 09/01/2007  . H/O hiatal hernia   . History of gastric ulcer 63yrs ago  . HYPERLIPIDEMIA 09/01/2007   takes Simvastatin daily  . HYPOTHYROIDISM 05/29/2009   takes Synthroid daily  . Impaired glucose tolerance    A1C 6.8 in 06/2012  . MI, old   . Neck pain    pinched nerve  . Nocturia   . PEPTIC ULCER DISEASE 09/01/2007  . Sleep apnea    pt denies  . TESTOSTERONE DEFICIENCY 09/01/2007    Past Surgical History:  Procedure Laterality Date  . arm surgery  1957   d/t broken  . CARDIAC CATHETERIZATION  2005; 06/01/2012  . COLONOSCOPY  2005  . CORONARY ANGIOPLASTY WITH STENT PLACEMENT  2001   RCA-1 stent  . CORONARY ARTERY BYPASS GRAFT  06/09/2012   Procedure: CORONARY ARTERY BYPASS GRAFTING (CABG);  Surgeon: Grace Isaac, MD;  Location: [place];  Service: Open Heart Surgery;  Laterality: N/A;  CABG x four;  using left internal mammary artery and right leg greater saphenous vein harvested endoscopically  . ESOPHAGOGASTRODUODENOSCOPY    . PERCUTANEOUS CORONARY STENT INTERVENTION (PCI-S) N/A 06/01/2012   Procedure: PERCUTANEOUS CORONARY STENT INTERVENTION (PCI-S);  Surgeon: Burnell Blanks, MD;  Location[facility] C[facility] LAB;  Service: Cardiovascular;  Laterality: N/A;  . TONSILLECTOMY AND ADENOIDECTOMY  1955   at age 1    ROS:   As stated in the HPI and negative for all other systems.   PHYSICAL EXAM BP 105/64   Pulse (!) 51   Ht 5\' 8"  (1.727 m)   Wt 205 lb 6.4 oz (93.2 kg)   BMI 31.23 kg/m  GENERAL:  Well appearing NECK:  No jugular venous distention, waveform within normal limits, carotid upstroke brisk and symmetric, no bruits, no thyromegaly LUNGS:  Clear to auscultation bilaterally CHEST:  Well healed sternotomy scar. HEART:  PMI not displaced or sustained,S1  and S2 within normal limits, no S3, no S4, no clicks, no rubs, no murmurs ABD:  Flat, positive bowel sounds normal in frequency in pitch, no bruits, no rebound, no guarding, no midline pulsatile mass, no hepatomegaly, no splenomegaly, obese EXT:  2 plus pulses throughout, no edema, no cyanosis no clubbing  Lab Results  Component Value Date   HGBA1C 7.4 08/12/2016   Lab Results  Component Value Date   CHOL 164 08/06/2016   TRIG 110.0 08/06/2016   HDL 31.20 (L) 08/06/2016   LDLCALC 111 (H) 08/06/2016    EKG:  Sinus rhythm, rate 51, axis with in normal limits, intervals within normal limits, no acute ST-T wave changes.  10/29/2016  ASSESSMENT AND PLAN  CAD  The patient has no new sypmtoms.  No further cardiovascular testing is indicated.  We will continue with aggressive risk reduction and meds as listed.   of note he can reduce his aspirin 81 mg daily.   We talked at length about the need to routinely exercise and how he might accomplish this.   DM We talked about diet and exercise and he is due to have his A1c checked again.  Hyperlipidemia  His LDL was not at target. I'm going to change him to Crestor 40 mg daily. Get a lipid and liver in 10 weeks.

## 2016-10-29 ENCOUNTER — Encounter: Payer: Self-pay | Admitting: Cardiology

## 2016-10-29 ENCOUNTER — Ambulatory Visit (INDEPENDENT_AMBULATORY_CARE_PROVIDER_SITE_OTHER): Payer: Medicare HMO | Admitting: Cardiology

## 2016-10-29 VITALS — BP 105/64 | HR 51 | Ht 68.0 in | Wt 205.4 lb

## 2016-10-29 DIAGNOSIS — H524 Presbyopia: Secondary | ICD-10-CM | POA: Diagnosis not present

## 2016-10-29 DIAGNOSIS — I2583 Coronary atherosclerosis due to lipid rich plaque: Secondary | ICD-10-CM

## 2016-10-29 DIAGNOSIS — Z79899 Other long term (current) drug therapy: Secondary | ICD-10-CM | POA: Diagnosis not present

## 2016-10-29 DIAGNOSIS — I2510 Atherosclerotic heart disease of native coronary artery without angina pectoris: Secondary | ICD-10-CM | POA: Diagnosis not present

## 2016-10-29 DIAGNOSIS — E785 Hyperlipidemia, unspecified: Secondary | ICD-10-CM

## 2016-10-29 MED ORDER — ROSUVASTATIN CALCIUM 40 MG PO TABS: 40.0000 mg | ORAL_TABLET | Freq: Every day | ORAL | 3 refills | Status: DC

## 2016-10-29 NOTE — Patient Instructions (Signed)
Medication Instructions:  STOP- Atorvastatin START- Crestor 40 mg daily DECREASE- Aspirin 81 mg daily  Labwork: Fasting Lipids Liver in 10 Weeks  Testing/Procedures: None Ordered  Follow-Up: Your physician wants you to follow-up in: 1 Year. You will receive a reminder letter in the mail two months in advance. If you don't receive a letter, please call our office to schedule the follow-up appointment.   Any Other Special Instructions Will Be Listed Below (If Applicable).     If you need a refill on your cardiac medications before your next appointment, please call your pharmacy.

## 2016-11-11 ENCOUNTER — Encounter: Payer: Self-pay | Admitting: Internal Medicine

## 2016-11-11 ENCOUNTER — Ambulatory Visit (INDEPENDENT_AMBULATORY_CARE_PROVIDER_SITE_OTHER): Payer: Medicare HMO | Admitting: Internal Medicine

## 2016-11-11 VITALS — BP 102/60 | HR 80 | Temp 98.2°F | Ht 68.0 in | Wt 203.0 lb

## 2016-11-11 DIAGNOSIS — Z23 Encounter for immunization: Secondary | ICD-10-CM | POA: Diagnosis not present

## 2016-11-11 DIAGNOSIS — E039 Hypothyroidism, unspecified: Secondary | ICD-10-CM | POA: Diagnosis not present

## 2016-11-11 DIAGNOSIS — E119 Type 2 diabetes mellitus without complications: Secondary | ICD-10-CM

## 2016-11-11 DIAGNOSIS — E785 Hyperlipidemia, unspecified: Secondary | ICD-10-CM | POA: Diagnosis not present

## 2016-11-11 LAB — POCT GLYCOSYLATED HEMOGLOBIN (HGB A1C): Hemoglobin A1C: 5.7

## 2016-11-11 MED ORDER — METFORMIN HCL ER 500 MG PO TB24: 500.0000 mg | ORAL_TABLET | Freq: Every day | ORAL | 5 refills | Status: DC

## 2016-11-11 NOTE — Progress Notes (Signed)
Subjective:    Patient ID: Adrian Gamble, male    DOB: 1946-04-29, 70 y.o.   MRN: [id number]  HPI  70 year old patient who has a recent diagnosis of type 2 diabetes.  Hemoglobin A1c 3 months ago was 7.4.  He is on submaximal metformin dosing 500 mg daily.  He has some occasional loose stool a few times per month but is manageable.  He does hold this medicine when he needs to travel He has seen cardiology recently and statin therapy has been changed to Crestor 40 daily He has hypothyroidism  He has had a recent eye exam  Wt Readings from Last 3 Encounters:  11/11/16 203 lb (92.1 kg)  10/29/16 205 lb 6.4 oz (93.2 kg)  10/01/16 208 lb (94.3 kg)    Past Medical History:  Diagnosis Date  . CORONARY ARTERY DISEASE 09/01/2007   s/p MI - s/p PCI to RCA 2001;  [place] 7/13: 3v CAD, good LVF - >  s/p CABG 7/13 (L-LAD, S-OM1/dCFX, S-dRCA)  . Diverticulosis   . Early cataracts, bilateral   . GERD 09/01/2007  . H/O hiatal hernia   . History of gastric ulcer 64yrs ago  . HYPERLIPIDEMIA 09/01/2007   takes Simvastatin daily  . HYPOTHYROIDISM 05/29/2009   takes Synthroid daily  . Impaired glucose tolerance    A1C 6.8 in 06/2012  . MI, old   . Neck pain    pinched nerve  . Nocturia   . PEPTIC ULCER DISEASE 09/01/2007  . Sleep apnea    pt denies  . TESTOSTERONE DEFICIENCY 09/01/2007     Social History   Social History  . Marital status: Married    Spouse name: N/A  . Number of children: N/A  . Years of education: N/A   Occupational History  . Retired    Social History Main Topics  . Smoking status: Former Smoker    Types: Pipe, Cigars    Quit date: 06/01/2012  . Smokeless tobacco: Never Used     Comment: patient states that he is not somking the pipe or cigars any more  . Alcohol use Yes     Comment: rare  . Drug use: No  . Sexual activity: Yes   Other Topics Concern  . Not on file   Social History Narrative  . No narrative on file    Past Surgical History:  Procedure  Laterality Date  . arm surgery  1957   d/t broken  . CARDIAC CATHETERIZATION  2005; 06/01/2012  . COLONOSCOPY  2005  . CORONARY ANGIOPLASTY WITH STENT PLACEMENT  2001   RCA-1 stent  . CORONARY ARTERY BYPASS GRAFT  06/09/2012   Procedure: CORONARY ARTERY BYPASS GRAFTING (CABG);  Surgeon: Grace Isaac, MD;  Location: [place];  Service: Open Heart Surgery;  Laterality: N/A;  CABG x four;  using left internal mammary artery and right leg greater saphenous vein harvested endoscopically  . ESOPHAGOGASTRODUODENOSCOPY    . PERCUTANEOUS CORONARY STENT INTERVENTION (PCI-S) N/A 06/01/2012   Procedure: PERCUTANEOUS CORONARY STENT INTERVENTION (PCI-S);  Surgeon: Burnell Blanks, MD;  Location[facility] CATH LAB;  Service: Cardiovascular;  Laterality: N/A;  . [place]   at age 63    Family History  Problem Relation Age of Onset  . Memory loss Father   . Coronary artery disease Father 19    cabg  . Colon cancer Neg Hx     No Known Allergies  Current Outpatient Prescriptions on File Prior to Visit  Medication Sig Dispense Refill  . aspirin EC 81 MG tablet Take 81 mg by mouth daily.    Marland Kitchen glucose blood (ONETOUCH VERIO) test strip USE TO CHECK BLOOD SUGAR DAILY AND PRN 100 each 12  . levothyroxine (SYNTHROID, LEVOTHROID) 125 MCG tablet TAKE ONE TABLET BY MOUTH ONCE DAILY 90 tablet 1  . metoprolol tartrate (LOPRESSOR) 25 MG tablet Take 0.5 tablets (12.5 mg total) by mouth 2 (two) times daily. Please keep your upcoming appointment for refills. 30 tablet 1  . ONETOUCH DELICA LANCETS FINE MISC USE TO CHECK BLOOD SUGAR DAILY AND PRN 100 each 12  . rosuvastatin (CRESTOR) 40 MG tablet Take 1 tablet (40 mg total) by mouth daily. 90 tablet 3   No current facility-administered medications on file prior to visit.     BP 102/60 (BP Location: Left Arm, Patient Position: Sitting, Cuff Size: Normal)   Pulse 80   Temp 98.2 F (36.8 C) (Oral)   Ht 5\' 8"  (1.727 m)   Wt 203 lb (92.1  kg)   SpO2 93%   BMI 30.87 kg/m     Review of Systems  Constitutional: Negative for appetite change, chills, fatigue and fever.  HENT: Negative for congestion, dental problem, ear pain, hearing loss, sore throat, tinnitus, trouble swallowing and voice change.   Eyes: Negative for pain, discharge and visual disturbance.  Respiratory: Negative for cough, chest tightness, wheezing and stridor.   Cardiovascular: Negative for chest pain, palpitations and leg swelling.  Gastrointestinal: Positive for diarrhea. Negative for abdominal distention, abdominal pain, blood in stool, constipation, nausea and vomiting.  Genitourinary: Negative for difficulty urinating, discharge, flank pain, genital sores, hematuria and urgency.  Musculoskeletal: Negative for arthralgias, back pain, gait problem, joint swelling, myalgias and neck stiffness.  Skin: Negative for rash.  Neurological: Negative for dizziness, syncope, speech difficulty, weakness, numbness and headaches.  Hematological: Negative for adenopathy. Does not bruise/bleed easily.  Psychiatric/Behavioral: Negative for behavioral problems and dysphoric mood. The patient is not nervous/anxious.        Objective:   Physical Exam  Constitutional: He is oriented to person, place, and time. He appears well-developed.  HENT:  Head: Normocephalic.  Right Ear: External ear normal.  Left Ear: External ear normal.  Eyes: Conjunctivae and EOM are normal.  Neck: Normal range of motion.  Cardiovascular: Normal rate and normal heart sounds.   Pulmonary/Chest: Breath sounds normal.  Abdominal: Bowel sounds are normal.  Musculoskeletal: Normal range of motion. He exhibits no edema or tenderness.  Neurological: He is alert and oriented to person, place, and time.  Psychiatric: He has a normal mood and affect. His behavior is normal.          Assessment & Plan:   Diabetes mellitus type 2.  We'll continue metformin extended release 500 mg daily.   Check hemoglobin A1c  Coronary artery disease Dyslipidemia  Hypothyroidism  Recheck 3-6 months depending on hemoglobin A1c  [name]

## 2016-11-11 NOTE — Addendum Note (Signed)
Addended by: Abelardo Diesel on: 11/11/2016 01:14 PM   Modules accepted: Orders

## 2016-11-11 NOTE — Patient Instructions (Signed)
Limit your sodium (Salt) intake   Please check your hemoglobin A1c every 3 months  Please check your blood pressure on a regular basis.  If it is consistently greater than 150/90, please make an office appointment.  Return in 3 months for follow-up  

## 2016-11-11 NOTE — Progress Notes (Signed)
Pre visit review using our clinic review tool, if applicable. No additional management support is needed unless otherwise documented below in the visit note. 

## 2017-01-01 ENCOUNTER — Other Ambulatory Visit: Payer: Self-pay | Admitting: Cardiology

## 2017-04-07 DIAGNOSIS — R69 Illness, unspecified: Secondary | ICD-10-CM | POA: Diagnosis not present

## 2017-04-17 ENCOUNTER — Other Ambulatory Visit: Payer: Self-pay | Admitting: Internal Medicine

## 2017-06-23 ENCOUNTER — Telehealth: Payer: Self-pay | Admitting: Cardiology

## 2017-06-23 DIAGNOSIS — E785 Hyperlipidemia, unspecified: Secondary | ICD-10-CM

## 2017-06-23 DIAGNOSIS — Z79899 Other long term (current) drug therapy: Secondary | ICD-10-CM

## 2017-06-23 NOTE — Telephone Encounter (Signed)
New message    Pt is calling asking for a call back from RN. He said it's about his prescription for his blood work. Please call.

## 2017-06-23 NOTE — Telephone Encounter (Signed)
Returned call to patient, patient requesting new lab slips to have his lipid panel and hepatic function test drawn that was suppose to be drawn 6 months ago and he lost the slips.  New lab orders placed.  Patient requesting to have these drawn at his PCP.     Lab orders placed and slips placed at front desk for pickup per patient request.

## 2017-06-24 ENCOUNTER — Telehealth: Payer: Self-pay | Admitting: Internal Medicine

## 2017-06-24 NOTE — Telephone Encounter (Signed)
Please advise 

## 2017-06-24 NOTE — Telephone Encounter (Signed)
Pt would like to have his A1C checked when he comes in for other labs that Cardiology wants drawn.

## 2017-06-24 NOTE — Telephone Encounter (Signed)
Okay, will do

## 2017-07-07 ENCOUNTER — Other Ambulatory Visit (INDEPENDENT_AMBULATORY_CARE_PROVIDER_SITE_OTHER): Payer: Medicare HMO

## 2017-07-07 ENCOUNTER — Other Ambulatory Visit: Payer: Self-pay | Admitting: Internal Medicine

## 2017-07-07 DIAGNOSIS — E119 Type 2 diabetes mellitus without complications: Secondary | ICD-10-CM

## 2017-07-07 LAB — LIPID PANEL
CHOLESTEROL: 117 mg/dL (ref 0–200)
HDL: 37.6 mg/dL — ABNORMAL LOW (ref >39.00)
LDL Cholesterol: 63 mg/dL (ref 0–99)
NonHDL: 79.53
TRIGLYCERIDES: 81 mg/dL (ref 0.0–149.0)
Total CHOL/HDL Ratio: 3
VLDL: 16.2 mg/dL (ref 0.0–40.0)

## 2017-07-07 LAB — HEPATIC FUNCTION PANEL
ALK PHOS: 47 U/L (ref 39–117)
ALT: 13 U/L (ref 0–53)
AST: 16 U/L (ref 0–37)
Albumin: 4.1 g/dL (ref 3.5–5.2)
BILIRUBIN DIRECT: 0.2 mg/dL (ref 0.0–0.3)
TOTAL PROTEIN: 6.6 g/dL (ref 6.0–8.3)
Total Bilirubin: 1 mg/dL (ref 0.2–1.2)

## 2017-07-07 LAB — HEMOGLOBIN A1C: HEMOGLOBIN A1C: 6.1 % (ref 4.6–6.5)

## 2017-07-11 ENCOUNTER — Encounter: Payer: Self-pay | Admitting: Internal Medicine

## 2017-07-11 ENCOUNTER — Ambulatory Visit (INDEPENDENT_AMBULATORY_CARE_PROVIDER_SITE_OTHER): Payer: Medicare HMO | Admitting: Internal Medicine

## 2017-07-11 VITALS — BP 138/72 | HR 69 | Temp 98.1°F | Ht 68.0 in | Wt 192.0 lb

## 2017-07-11 DIAGNOSIS — F17201 Nicotine dependence, unspecified, in remission: Secondary | ICD-10-CM

## 2017-07-11 DIAGNOSIS — E785 Hyperlipidemia, unspecified: Secondary | ICD-10-CM | POA: Diagnosis not present

## 2017-07-11 DIAGNOSIS — E119 Type 2 diabetes mellitus without complications: Secondary | ICD-10-CM

## 2017-07-11 DIAGNOSIS — I2510 Atherosclerotic heart disease of native coronary artery without angina pectoris: Secondary | ICD-10-CM

## 2017-07-11 DIAGNOSIS — R69 Illness, unspecified: Secondary | ICD-10-CM | POA: Diagnosis not present

## 2017-07-11 NOTE — Patient Instructions (Addendum)
Limit your sodium (Salt) intake  Please check your blood pressure on a regular basis.  If it is consistently greater than 150/90, please make an office appointment.  Return in 6 months for follow-up  Low intensity chest CT for lung cancer screening

## 2017-07-11 NOTE — Progress Notes (Signed)
Subjective:    Patient ID: Adrian Gamble, male    DOB: 1946-05-01, 71 y.o.   MRN: [id number]  HPI  71 year old patient who is seen today with a number of concerns.  One is a painful nodule involving the left hand near the second MCP joint.  This has larger resolved He has a history of type 2 diabetes  Lab Results  Component Value Date   HGBA1C 6.1 07/07/2017   He has had a colonoscopy in 2015 that revealed significant diverticulosis only.  He does have some occasional left-sided abdominal discomfort.  At times he feels a mass effect in the left lower quadrant.  He has been quite anxious.  His wife has a recent diagnosis of metastatic lung cancer  Patient also gives a history of prior heavy tobacco use and also asbestos exposure  Past Medical History:  Diagnosis Date  . CORONARY ARTERY DISEASE 09/01/2007   s/p MI - s/p PCI to RCA 2001;  [place] 7/13: 3v CAD, good LVF - >  s/p CABG 7/13 (L-LAD, S-OM1/dCFX, S-dRCA)  . Diverticulosis   . Early cataracts, bilateral   . GERD 09/01/2007  . H/O hiatal hernia   . History of gastric ulcer 75yrs ago  . HYPERLIPIDEMIA 09/01/2007   takes Simvastatin daily  . HYPOTHYROIDISM 05/29/2009   takes Synthroid daily  . Impaired glucose tolerance    A1C 6.8 in 06/2012  . MI, old   . Neck pain    pinched nerve  . Nocturia   . PEPTIC ULCER DISEASE 09/01/2007  . Sleep apnea    pt denies  . TESTOSTERONE DEFICIENCY 09/01/2007     Social History   Social History  . Marital status: Married    Spouse name: N/A  . Number of children: N/A  . Years of education: N/A   Occupational History  . Retired    Social History Main Topics  . Smoking status: Former Smoker    Types: Pipe, Cigars    Quit date: 06/01/2012  . Smokeless tobacco: Never Used     Comment: patient states that he is not somking the pipe or cigars any more  . Alcohol use Yes     Comment: rare  . Drug use: No  . Sexual activity: Yes   Other Topics Concern  . Not on file   Social  History Narrative  . No narrative on file    Past Surgical History:  Procedure Laterality Date  . arm surgery  1957   d/t broken  . CARDIAC CATHETERIZATION  2005; 06/01/2012  . COLONOSCOPY  2005  . CORONARY ANGIOPLASTY WITH STENT PLACEMENT  2001   RCA-1 stent  . CORONARY ARTERY BYPASS GRAFT  06/09/2012   Procedure: CORONARY ARTERY BYPASS GRAFTING (CABG);  Surgeon: Grace Isaac, MD;  Location: [place];  Service: Open Heart Surgery;  Laterality: N/A;  CABG x four;  using left internal mammary artery and right leg greater saphenous vein harvested endoscopically  . ESOPHAGOGASTRODUODENOSCOPY    . PERCUTANEOUS CORONARY STENT INTERVENTION (PCI-S) N/A 06/01/2012   Procedure: PERCUTANEOUS CORONARY STENT INTERVENTION (PCI-S);  Surgeon: Burnell Blanks, MD;  Location: [facility] CATH LAB;  Service: Cardiovascular;  Laterality: N/A;  . [place]   at age 54    Family History  Problem Relation Age of Onset  . Memory loss Father   . Coronary artery disease Father 4       cabg  . Colon cancer Neg Hx     No  Known Allergies  Current Outpatient Prescriptions on File Prior to Visit  Medication Sig Dispense Refill  . aspirin EC 81 MG tablet Take 81 mg by mouth daily.    Marland Kitchen glucose blood (ONETOUCH VERIO) test strip USE TO CHECK BLOOD SUGAR DAILY AND PRN 100 each 12  . levothyroxine (SYNTHROID, LEVOTHROID) 125 MCG tablet TAKE ONE TABLET BY MOUTH ONCE DAILY 90 tablet 1  . metFORMIN (GLUCOPHAGE-XR) 500 MG 24 hr tablet Take 1 tablet (500 mg total) by mouth daily with breakfast. 90 tablet 5  . metoprolol tartrate (LOPRESSOR) 25 MG tablet TAKE ONE-HALF TABLET BY MOUTH TWICE DAILY. 30 tablet 11  . ONETOUCH DELICA LANCETS FINE MISC USE TO CHECK BLOOD SUGAR DAILY AND PRN 100 each 12  . rosuvastatin (CRESTOR) 40 MG tablet Take 1 tablet (40 mg total) by mouth daily. 90 tablet 3   No current facility-administered medications on file prior to visit.     BP 138/72 (BP Location: Left  Arm, Patient Position: Sitting, Cuff Size: Normal)   Pulse 69   Temp 98.1 F (36.7 C) (Oral)   Ht 5\' 8"  (1.727 m)   Wt 192 lb (87.1 kg)   SpO2 96%   BMI 29.19 kg/m     Review of Systems  Constitutional: Negative for appetite change, chills, fatigue and fever.  HENT: Negative for congestion, dental problem, ear pain, hearing loss, sore throat, tinnitus, trouble swallowing and voice change.   Eyes: Negative for pain, discharge and visual disturbance.  Respiratory: Negative for cough, chest tightness, wheezing and stridor.   Cardiovascular: Negative for chest pain, palpitations and leg swelling.  Gastrointestinal: Positive for abdominal pain. Negative for abdominal distention, blood in stool, constipation, diarrhea, nausea and vomiting.  Genitourinary: Negative for difficulty urinating, discharge, flank pain, genital sores, hematuria and urgency.  Musculoskeletal: Negative for arthralgias, back pain, gait problem, joint swelling, myalgias and neck stiffness.  Skin: Negative for rash.  Neurological: Negative for dizziness, syncope, speech difficulty, weakness, numbness and headaches.  Hematological: Negative for adenopathy. Does not bruise/bleed easily.  Psychiatric/Behavioral: Negative for behavioral problems and dysphoric mood. The patient is nervous/anxious.        Objective:   Physical Exam  Constitutional: He is oriented to person, place, and time. He appears well-developed.  HENT:  Head: Normocephalic.  Right Ear: External ear normal.  Left Ear: External ear normal.  Eyes: Conjunctivae and EOM are normal.  Neck: Normal range of motion.  Cardiovascular: Normal rate and normal heart sounds.   Pulmonary/Chest: Breath sounds normal.  Abdominal: Bowel sounds are normal.  Normal GI examination without mass  Musculoskeletal: Normal range of motion. He exhibits no edema or tenderness.  The dorsal aspect of the left hand appeared normal  Neurological: He is alert and oriented to  person, place, and time.  Psychiatric: He has a normal mood and affect. His behavior is normal.          Assessment & Plan:   Left hand pain.  Probably had a ganglion cyst that has larger resolved History abdominal pain and fullness.  Probably secondary to constipated stool.  Patient did have a normal colonoscopy except for diverticular disease in 2015 Type 2 diabetes, well controlled History of tobacco use.  Will set up for a low intensity.  Screening chest CT  [name]

## 2017-07-14 ENCOUNTER — Other Ambulatory Visit: Payer: Self-pay | Admitting: Internal Medicine

## 2017-07-14 ENCOUNTER — Telehealth: Payer: Self-pay | Admitting: Cardiology

## 2017-07-14 DIAGNOSIS — F17201 Nicotine dependence, unspecified, in remission: Secondary | ICD-10-CM

## 2017-07-14 NOTE — Telephone Encounter (Signed)
Spoke with pt, aware labs have not been reviewed by dr hochrein yet. We will call once he results labs.

## 2017-07-14 NOTE — Telephone Encounter (Signed)
Patient calling, states that he would like to know if Dr. Percival Spanish would need to change his medication based on the results of his last blood work.

## 2017-07-15 ENCOUNTER — Telehealth: Payer: Self-pay | Admitting: Acute Care

## 2017-07-17 NOTE — Telephone Encounter (Signed)
Will forward to lung nodule pool.  

## 2017-07-18 NOTE — Telephone Encounter (Signed)
Spoke with pt to scheduled SDMV and CT.  Pt is ex cigarette smoker for 20 years but smoker cigars and pipe until 5 years ago.  Will consult with Eric Form, NP regarding pt's smoking history and get back with pt.  Pt verbalized understanding.

## 2017-07-22 NOTE — Telephone Encounter (Signed)
Spoke with pt and advised per Eric Form, NP that due to pt quitting smoking cigarettes 20 years ago pt does not qualify for the lung cancer screening program.  I discussed the out of pocket costs for a CT through [facility].  Pt verbalized understanding.  Letter sent to Dr Darnell Level to make him aware why referral has been cancelled.

## 2017-07-23 ENCOUNTER — Telehealth: Payer: Self-pay | Admitting: Cardiology

## 2017-07-23 NOTE — Telephone Encounter (Signed)
New message   Pt c/o medication issue:  1. Name of Medication: rosuvastatin (CRESTOR) 40 MG tablet(Expired)  2. How are you currently taking this medication (dosage and times per day)? 40mg   3. Are you having a reaction (difficulty breathing--STAT)? NO  4. What is your medication issue? Pt states blood test he had 2 weeks ago was supposed to determine if he should stay on this med but no one ever called him back

## 2017-07-24 NOTE — Telephone Encounter (Signed)
Patient called and made aware to stay on the current dose of Crestor. He verbalized his understanding.

## 2017-07-24 NOTE — Telephone Encounter (Signed)
That lab was drawn by Marletta Lor, MD.  The result did not come to me.  I reviewed and his LDL was excellent.  Liver fine. He should remain on the Crestor.

## 2017-08-21 ENCOUNTER — Encounter: Payer: Self-pay | Admitting: Internal Medicine

## 2017-09-08 DIAGNOSIS — R69 Illness, unspecified: Secondary | ICD-10-CM | POA: Diagnosis not present

## 2017-10-13 ENCOUNTER — Ambulatory Visit (INDEPENDENT_AMBULATORY_CARE_PROVIDER_SITE_OTHER): Payer: Medicare HMO

## 2017-10-13 DIAGNOSIS — Z23 Encounter for immunization: Secondary | ICD-10-CM

## 2017-10-22 ENCOUNTER — Other Ambulatory Visit: Payer: Self-pay | Admitting: Internal Medicine

## 2017-11-16 ENCOUNTER — Other Ambulatory Visit: Payer: Self-pay | Admitting: Cardiology

## 2017-12-08 DIAGNOSIS — H524 Presbyopia: Secondary | ICD-10-CM | POA: Diagnosis not present

## 2017-12-25 ENCOUNTER — Other Ambulatory Visit: Payer: Self-pay | Admitting: Cardiology

## 2017-12-31 ENCOUNTER — Other Ambulatory Visit: Payer: Self-pay | Admitting: Internal Medicine

## 2018-01-15 ENCOUNTER — Ambulatory Visit: Payer: Medicare HMO | Admitting: Internal Medicine

## 2018-02-04 ENCOUNTER — Other Ambulatory Visit: Payer: Self-pay | Admitting: Cardiology

## 2018-02-10 ENCOUNTER — Encounter: Payer: Self-pay | Admitting: Internal Medicine

## 2018-02-10 ENCOUNTER — Ambulatory Visit (INDEPENDENT_AMBULATORY_CARE_PROVIDER_SITE_OTHER): Payer: Medicare HMO | Admitting: Internal Medicine

## 2018-02-10 VITALS — BP 90/50 | HR 69 | Temp 98.2°F | Wt 198.0 lb

## 2018-02-10 DIAGNOSIS — E119 Type 2 diabetes mellitus without complications: Secondary | ICD-10-CM | POA: Diagnosis not present

## 2018-02-10 DIAGNOSIS — E785 Hyperlipidemia, unspecified: Secondary | ICD-10-CM

## 2018-02-10 DIAGNOSIS — I2510 Atherosclerotic heart disease of native coronary artery without angina pectoris: Secondary | ICD-10-CM | POA: Diagnosis not present

## 2018-02-10 NOTE — Progress Notes (Signed)
Subjective:    Patient ID: Adrian Gamble, male    DOB: May 11, 1946, 72 y.o.   MRN: [id number]  HPI  72 year old patient who was seen today for his biannual follow-up. He has a history of type 2 diabetes which has been managed with metformin therapy only. Hemoglobin A1c today 6.0 He has a history of coronary artery disease as well as dyslipidemia.  Remains on high-dose statin therapy which she continues to tolerate. He has hypothyroidism and remains on supplemental levothyroxine.  No cardiopulmonary complaints.  Past Medical History:  Diagnosis Date  . CORONARY ARTERY DISEASE 09/01/2007   s/p MI - s/p PCI to RCA 2001;  [place] 7/13: 3v CAD, good LVF - >  s/p CABG 7/13 (L-LAD, S-OM1/dCFX, S-dRCA)  . Diverticulosis   . Early cataracts, bilateral   . GERD 09/01/2007  . H/O hiatal hernia   . History of gastric ulcer 28yrs ago  . HYPERLIPIDEMIA 09/01/2007   takes Simvastatin daily  . HYPOTHYROIDISM 05/29/2009   takes Synthroid daily  . Impaired glucose tolerance    A1C 6.8 in 06/2012  . MI, old   . Neck pain    pinched nerve  . Nocturia   . PEPTIC ULCER DISEASE 09/01/2007  . Sleep apnea    pt denies  . TESTOSTERONE DEFICIENCY 09/01/2007     Social History   Socioeconomic History  . Marital status: Married    Spouse name: Not on file  . Number of children: Not on file  . Years of education: Not on file  . Highest education level: Not on file  Social Needs  . Financial resource strain: Not on file  . Food insecurity - worry: Not on file  . Food insecurity - inability: Not on file  . Transportation needs - medical: Not on file  . Transportation needs - non-medical: Not on file  Occupational History  . Occupation: Retired  Tobacco Use  . Smoking status: Former Smoker    Types: Pipe, Cigars    Last attempt to quit: 06/01/2012    Years since quitting: 5.6  . Smokeless tobacco: Never Used  . Tobacco comment: patient states that he is not somking the pipe or cigars any more    Substance and Sexual Activity  . Alcohol use: Yes    Comment: rare  . Drug use: No  . Sexual activity: Yes  Other Topics Concern  . Not on file  Social History Narrative  . Not on file    Past Surgical History:  Procedure Laterality Date  . arm surgery  1957   d/t broken  . CARDIAC CATHETERIZATION  2005; 06/01/2012  . COLONOSCOPY  2005  . CORONARY ANGIOPLASTY WITH STENT PLACEMENT  2001   RCA-1 stent  . CORONARY ARTERY BYPASS GRAFT  06/09/2012   Procedure: CORONARY ARTERY BYPASS GRAFTING (CABG);  Surgeon: Grace Isaac, MD;  Location: [facility];  Service: Open Heart Surgery;  Laterality: N/A;  CABG x four;  using left internal mammary artery and right leg greater saphenous vein harvested endoscopically  . ESOPHAGOGASTRODUODENOSCOPY    . PERCUTANEOUS CORONARY STENT INTERVENTION (PCI-S) N/A 06/01/2012   Procedure: PERCUTANEOUS CORONARY STENT INTERVENTION (PCI-S);  Surgeon: Burnell Blanks, MD;  Location[facility] C[facility] LAB;  Service: Cardiovascular;  Laterality: N/A;  . [place]   at age 73    Family History  Problem Relation Age of Onset  . Memory loss Father   . Coronary artery disease Father 6  cabg  . Colon cancer Neg Hx     No Known Allergies  Current Outpatient Medications on File Prior to Visit  Medication Sig Dispense Refill  . aspirin EC 81 MG tablet Take 81 mg by mouth daily.    Marland Kitchen glucose blood (ONETOUCH VERIO) test strip USE TO CHECK BLOOD SUGAR DAILY AND PRN 100 each 12  . levothyroxine (SYNTHROID, LEVOTHROID) 125 MCG tablet TAKE 1 TABLET BY MOUTH ONCE DAILY 90 tablet 1  . metFORMIN (GLUCOPHAGE-XR) 500 MG 24 hr tablet TAKE ONE TABLET BY MOUTH ONCE DAILY WITH  BREAKFAST 90 tablet 5  . metoprolol tartrate (LOPRESSOR) 25 MG tablet TAKE ONE-HALF TABLET BY MOUTH TWICE DAILY. 30 tablet 11  . ONETOUCH DELICA LANCETS FINE MISC USE TO CHECK BLOOD SUGAR DAILY AND PRN 100 each 12  . rosuvastatin (CRESTOR) 40 MG tablet Take 1 tablet (40 mg  total) by mouth daily. PLEASE CONTACT OUR OFFICE FOR AN APPOINTMENT FOR CONTINUATION OF ALL REFILL REQUESTS 30 tablet 0   No current facility-administered medications on file prior to visit.     BP (!) 90/50 (BP Location: Right Arm, Patient Position: Sitting, Cuff Size: Large)   Pulse 69   Temp 98.2 F (36.8 C) (Oral)   Wt 198 lb (89.8 kg)   SpO2 95%   BMI 30.11 kg/m     Review of Systems  Constitutional: Negative for appetite change, chills, fatigue and fever.  HENT: Negative for congestion, dental problem, ear pain, hearing loss, sore throat, tinnitus, trouble swallowing and voice change.   Eyes: Negative for pain, discharge and visual disturbance.  Respiratory: Negative for cough, chest tightness, wheezing and stridor.   Cardiovascular: Negative for chest pain, palpitations and leg swelling.  Gastrointestinal: Negative for abdominal distention, abdominal pain, blood in stool, constipation, diarrhea, nausea and vomiting.  Genitourinary: Negative for difficulty urinating, discharge, flank pain, genital sores, hematuria and urgency.  Musculoskeletal: Negative for arthralgias, back pain, gait problem, joint swelling, myalgias and neck stiffness.  Skin: Negative for rash.  Neurological: Negative for dizziness, syncope, speech difficulty, weakness, numbness and headaches.  Hematological: Negative for adenopathy. Does not bruise/bleed easily.  Psychiatric/Behavioral: Negative for behavioral problems and dysphoric mood. The patient is not nervous/anxious.        Objective:   Physical Exam  Constitutional: He is oriented to person, place, and time. He appears well-developed.  Blood pressure 110/70  Weight 198  HENT:  Head: Normocephalic.  Right Ear: External ear normal.  Left Ear: External ear normal.  Eyes: Conjunctivae and EOM are normal.  Neck: Normal range of motion.  Cardiovascular: Normal rate and normal heart sounds.  Pulmonary/Chest: Breath sounds normal.  Abdominal:  Bowel sounds are normal.  Musculoskeletal: Normal range of motion. He exhibits no edema or tenderness.  Neurological: He is alert and oriented to person, place, and time.  Psychiatric: He has a normal mood and affect. His behavior is normal.          Assessment & Plan:   Diabetes mellitus.  Remains under excellent control.  Continue metformin Hypothyroidism.  Continue supplemental levothyroxine Dyslipidemia continue high-dose statin therapy Coronary artery disease stable  CPX 6 months No change in medical regimen  [name]

## 2018-02-10 NOTE — Patient Instructions (Signed)
Limit your sodium (Salt) intake  Please check your blood pressure on a regular basis.  If it is consistently greater than 150/90, please make an office appointment.    It is important that you exercise regularly, at least 20 minutes 3 to 4 times per week.  If you develop chest pain or shortness of breath seek  medical attention.  You need to lose weight.  Consider a lower calorie diet and regular exercise.   Please check your hemoglobin A1c every 3-6  Months  Return in 6 months for follow-up

## 2018-03-02 ENCOUNTER — Other Ambulatory Visit: Payer: Self-pay | Admitting: Cardiology

## 2018-03-04 DIAGNOSIS — E119 Type 2 diabetes mellitus without complications: Secondary | ICD-10-CM | POA: Diagnosis not present

## 2018-03-04 DIAGNOSIS — I252 Old myocardial infarction: Secondary | ICD-10-CM | POA: Diagnosis not present

## 2018-03-04 DIAGNOSIS — E785 Hyperlipidemia, unspecified: Secondary | ICD-10-CM | POA: Diagnosis not present

## 2018-03-04 DIAGNOSIS — I2510 Atherosclerotic heart disease of native coronary artery without angina pectoris: Secondary | ICD-10-CM | POA: Diagnosis not present

## 2018-03-04 DIAGNOSIS — E039 Hypothyroidism, unspecified: Secondary | ICD-10-CM | POA: Diagnosis not present

## 2018-03-04 DIAGNOSIS — R69 Illness, unspecified: Secondary | ICD-10-CM | POA: Diagnosis not present

## 2018-03-04 DIAGNOSIS — Z7984 Long term (current) use of oral hypoglycemic drugs: Secondary | ICD-10-CM | POA: Diagnosis not present

## 2018-03-04 DIAGNOSIS — Z8249 Family history of ischemic heart disease and other diseases of the circulatory system: Secondary | ICD-10-CM | POA: Diagnosis not present

## 2018-03-04 DIAGNOSIS — I10 Essential (primary) hypertension: Secondary | ICD-10-CM | POA: Diagnosis not present

## 2018-03-30 ENCOUNTER — Other Ambulatory Visit: Payer: Self-pay | Admitting: Cardiology

## 2018-03-30 NOTE — Telephone Encounter (Signed)
REFILL 

## 2018-04-19 ENCOUNTER — Other Ambulatory Visit: Payer: Self-pay | Admitting: Internal Medicine

## 2018-04-22 ENCOUNTER — Other Ambulatory Visit: Payer: Self-pay | Admitting: Internal Medicine

## 2018-04-23 NOTE — Telephone Encounter (Signed)
Can we refill this or is patient due for some labs?

## 2018-04-23 NOTE — Telephone Encounter (Signed)
Okay for refill?  

## 2018-06-09 DIAGNOSIS — L570 Actinic keratosis: Secondary | ICD-10-CM | POA: Diagnosis not present

## 2018-06-09 DIAGNOSIS — B9689 Other specified bacterial agents as the cause of diseases classified elsewhere: Secondary | ICD-10-CM | POA: Diagnosis not present

## 2018-06-09 DIAGNOSIS — L0202 Furuncle of face: Secondary | ICD-10-CM | POA: Diagnosis not present

## 2018-06-09 DIAGNOSIS — X32XXXD Exposure to sunlight, subsequent encounter: Secondary | ICD-10-CM | POA: Diagnosis not present

## 2018-07-07 ENCOUNTER — Encounter: Payer: Self-pay | Admitting: Family Medicine

## 2018-07-07 ENCOUNTER — Ambulatory Visit (INDEPENDENT_AMBULATORY_CARE_PROVIDER_SITE_OTHER): Payer: Medicare HMO | Admitting: Family Medicine

## 2018-07-07 VITALS — BP 102/54 | HR 80 | Temp 98.6°F | Ht 68.0 in | Wt 201.6 lb

## 2018-07-07 DIAGNOSIS — I2510 Atherosclerotic heart disease of native coronary artery without angina pectoris: Secondary | ICD-10-CM

## 2018-07-07 DIAGNOSIS — Z1159 Encounter for screening for other viral diseases: Secondary | ICD-10-CM | POA: Diagnosis not present

## 2018-07-07 DIAGNOSIS — Z23 Encounter for immunization: Secondary | ICD-10-CM | POA: Diagnosis not present

## 2018-07-07 DIAGNOSIS — E039 Hypothyroidism, unspecified: Secondary | ICD-10-CM | POA: Diagnosis not present

## 2018-07-07 DIAGNOSIS — M5412 Radiculopathy, cervical region: Secondary | ICD-10-CM

## 2018-07-07 DIAGNOSIS — E785 Hyperlipidemia, unspecified: Secondary | ICD-10-CM | POA: Diagnosis not present

## 2018-07-07 DIAGNOSIS — E119 Type 2 diabetes mellitus without complications: Secondary | ICD-10-CM

## 2018-07-07 DIAGNOSIS — E1169 Type 2 diabetes mellitus with other specified complication: Secondary | ICD-10-CM | POA: Diagnosis not present

## 2018-07-07 DIAGNOSIS — K219 Gastro-esophageal reflux disease without esophagitis: Secondary | ICD-10-CM | POA: Diagnosis not present

## 2018-07-07 LAB — CBC WITH DIFFERENTIAL/PLATELET
Basophils Absolute: 0 10*3/uL (ref 0.0–0.1)
Basophils Relative: 0.5 % (ref 0.0–3.0)
Eosinophils Absolute: 0.1 10*3/uL (ref 0.0–0.7)
Eosinophils Relative: 1.6 % (ref 0.0–5.0)
HCT: 43.3 % (ref 39.0–52.0)
Hemoglobin: 15 g/dL (ref 13.0–17.0)
Lymphocytes Relative: 22.5 % (ref 12.0–46.0)
Lymphs Abs: 1.3 10*3/uL (ref 0.7–4.0)
MCHC: 34.7 g/dL (ref 30.0–36.0)
MCV: 90.1 fl (ref 78.0–100.0)
Monocytes Absolute: 0.4 10*3/uL (ref 0.1–1.0)
Monocytes Relative: 7.8 % (ref 3.0–12.0)
Neutro Abs: 3.8 10*3/uL (ref 1.4–7.7)
Neutrophils Relative %: 67.6 % (ref 43.0–77.0)
Platelets: 141 10*3/uL — ABNORMAL LOW (ref 150.0–400.0)
RBC: 4.8 Mil/uL (ref 4.22–5.81)
RDW: 13.4 % (ref 11.5–15.5)
WBC: 5.7 10*3/uL (ref 4.0–10.5)

## 2018-07-07 LAB — COMPREHENSIVE METABOLIC PANEL
ALT: 14 U/L (ref 0–53)
AST: 14 U/L (ref 0–37)
Albumin: 4.2 g/dL (ref 3.5–5.2)
Alkaline Phosphatase: 51 U/L (ref 39–117)
BUN: 19 mg/dL (ref 6–23)
CO2: 26 mEq/L (ref 19–32)
Calcium: 9.7 mg/dL (ref 8.4–10.5)
Chloride: 104 mEq/L (ref 96–112)
Creatinine, Ser: 1.02 mg/dL (ref 0.40–1.50)
GFR: 75.92 mL/min (ref >60.00)
Glucose, Bld: 188 mg/dL — ABNORMAL HIGH (ref 70–99)
Potassium: 3.8 mEq/L (ref 3.5–5.1)
Sodium: 138 mEq/L (ref 135–145)
Total Bilirubin: 1 mg/dL (ref 0.2–1.2)
Total Protein: 7 g/dL (ref 6.0–8.3)

## 2018-07-07 LAB — MICROALBUMIN / CREATININE URINE RATIO
Creatinine,U: 102.8 mg/dL
Microalb Creat Ratio: 1.3 mg/g (ref 0.0–30.0)
Microalb, Ur: 1.4 mg/dL (ref 0.0–1.9)

## 2018-07-07 LAB — POCT GLYCOSYLATED HEMOGLOBIN (HGB A1C): Hemoglobin A1C: 6.2 % — AB (ref 4.0–5.6)

## 2018-07-07 LAB — LIPID PANEL
Cholesterol: 143 mg/dL (ref 0–200)
HDL: 36.7 mg/dL — ABNORMAL LOW (ref >39.00)
LDL Cholesterol: 71 mg/dL (ref 0–99)
NonHDL: 105.93
Total CHOL/HDL Ratio: 4
Triglycerides: 177 mg/dL — ABNORMAL HIGH (ref 0.0–149.0)
VLDL: 35.4 mg/dL (ref 0.0–40.0)

## 2018-07-07 MED ORDER — METOPROLOL TARTRATE 25 MG PO TABS: 12.5000 mg | ORAL_TABLET | Freq: Two times a day (BID) | ORAL | 1 refills | Status: DC

## 2018-07-07 MED ORDER — TETANUS-DIPHTH-ACELL PERTUSSIS 5-2.5-18.5 LF-MCG/0.5 IM SUSP: 0.5000 mL | Freq: Once | INTRAMUSCULAR | 0 refills | Status: AC

## 2018-07-07 MED ORDER — METFORMIN HCL ER 500 MG PO TB24: ORAL_TABLET | ORAL | 1 refills | Status: DC

## 2018-07-07 MED ORDER — LEVOTHYROXINE SODIUM 125 MCG PO TABS: 125.0000 ug | ORAL_TABLET | Freq: Every day | ORAL | 1 refills | Status: DC

## 2018-07-07 MED ORDER — ROSUVASTATIN CALCIUM 40 MG PO TABS: 40.0000 mg | ORAL_TABLET | Freq: Every day | ORAL | 0 refills | Status: DC

## 2018-07-07 NOTE — Patient Instructions (Signed)
What is Aldara?  Aldara (imiquimod) is an immune response modifier. Aldara is used to treat actinic keratosis (a condition caused by too much sun exposure) on the face and scalp. Aldara (for the skin) is also used to treat a minor form of skin cancer called superficial basal cell carcinoma, when surgery would not be an appropriate treatment.

## 2018-07-07 NOTE — Progress Notes (Signed)
Adrian Gamble is a 72 y.o. male is here for a new patient visit.  History of Present Illness:   Adrian Gamble, CMA acting as scribe for Dr. Briscoe Deutscher.   HPI: Patient here to establish care. He has a few spots of face that cause discomfort on cheek and forehead. He has seen dermatologist in the past and at last visit he had some areas frozen off but did not address the areas that were tender.   Health Maintenance Due  Topic Date Due  . Hepatitis C Screening  [birth year]  . OPHTHALMOLOGY EXAM  09/06/1954  . URINE MICROALBUMIN  09/06/1954  . TETANUS/TDAP  08/31/2017  . HEMOGLOBIN A1C  01/07/2018  . INFLUENZA VACCINE  07/02/2018   Depression screen [facility] 2/9 07/07/2018 08/12/2016 04/25/2015  Decreased Interest 0 0 0  Down, Depressed, Hopeless 0 0 0  PHQ - 2 Score 0 0 0  Altered sleeping 0 - -  Tired, decreased energy 0 - -  Change in appetite 0 - -  Feeling bad or failure about yourself  0 - -  Trouble concentrating 0 - -  Moving slowly or fidgety/restless 0 - -  Suicidal thoughts 0 - -  PHQ-9 Score 0 - -  Difficult doing work/chores Not difficult at all - -   PMHx, SurgHx, SocialHx, FamHx, Medications, and Allergies were reviewed in the Visit Navigator and updated as appropriate.   Patient Active Problem List   Diagnosis Date Noted  . Type 2 diabetes mellitus treated without insulin ([place]), on Metformin, Crestor, and ASA 08/12/2016  . Hypothyroidism 05/29/2009  . 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 Testosterone deficiency 09/01/2007  . Hyperlipidemia associated with type 2 diabetes mellitus ([place]), on Crestor 09/01/2007  . 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 GERD 09/01/2007  . Peptic ulcer 09/01/2007   Social History   Tobacco Use  . Smoking status: Former Smoker    Types: Pipe, Cigars    Last attempt to quit: 06/01/2012    Years since quitting:  6.1  . Smokeless tobacco: Never Used  . Tobacco comment: patient states that he is not somking the pipe or cigars any more  Substance Use Topics  . Alcohol use: Yes    Comment: rare  . Drug use: No   Current Medications and Allergies:   .  aspirin EC 81 MG tablet, Take 81 mg by mouth daily., Disp: , Rfl:  .  clindamycin (CLINDAGEL) 1 % gel, APPLY THIN LAYER TOPICALLY TO AFFECTED AREA OF BEARD TWICE DAILY AS NEEDED, Disp: , Rfl: 99 .  levothyroxine (SYNTHROID, LEVOTHROID) 125 MCG tablet, TAKE 1 TABLET BY MOUTH ONCE DAILY, Disp: 90 tablet, Rfl: 1 .  metFORMIN (GLUCOPHAGE-XR) 500 MG 24 hr tablet, TAKE ONE TABLET BY MOUTH ONCE DAILY WITH  BREAKFAST, Disp: 90 tablet, Rfl: 5 .  metoprolol tartrate (LOPRESSOR) 25 MG tablet, TAKE ONE-HALF TABLET BY MOUTH TWICE DAILY., Disp: 30 tablet, Rfl: 11 .  rosuvastatin (CRESTOR) 40 MG tablet, Take 1 tablet (40 mg total) by mouth daily. NEED OV., Disp: 90 tablet, Rfl: 0  No Known Allergies   Review of Systems   Pertinent items are noted in the HPI. Otherwise, ROS is negative.  Vitals:   Vitals:   07/07/18 1030  BP: (!) 102/54  Pulse: 80  Temp: 98.6 F (37 C)  TempSrc: Oral  SpO2: 94%  Weight: 201 lb 9.6 oz (91.4 kg)  Height: 5\' 8"  (1.727 m)     Body mass index is 30.65 kg/m.  Physical Exam:   Physical Exam  Constitutional: He is oriented to person, place, and time. He appears well-developed and well-nourished. No distress.  HENT:  Head: Normocephalic and atraumatic.  Right Ear: External ear normal.  Left Ear: External ear normal.  Nose: Nose normal.  Mouth/Throat: Oropharynx is clear and moist.  Eyes: Pupils are equal, round, and reactive to light. Conjunctivae and EOM are normal.  Neck: Normal range of motion. Neck supple.  Cardiovascular: Normal rate, regular rhythm, normal heart sounds and intact distal pulses.  Pulmonary/Chest: Effort normal and breath sounds normal.  Abdominal: Soft. Bowel sounds are normal.  Musculoskeletal:  Normal range of motion.  Neurological: He is alert and oriented to person, place, and time.  Skin: Skin is warm and dry.  Psychiatric: He has a normal mood and affect. His behavior is normal. Judgment and thought content normal.  Nursing note and vitals reviewed.   Assessment and Plan:   Adrian Gamble was seen today for establish care.  Diagnoses and all orders for this visit:  Type 2 diabetes mellitus treated without insulin ([place]), on Metformin, Crestor, and ASA -     CBC with Differential/Platelet -     Microalbumin / creatinine urine ratio -     metFORMIN (GLUCOPHAGE-XR) 500 MG 24 hr tablet; TAKE ONE TABLET BY MOUTH ONCE DAILY WITH  BREAKFAST -     POCT glycosylated hemoglobin (Hb A1C)  Atherosclerosis of native coronary artery of native heart without angina pectoris -     metoprolol tartrate (LOPRESSOR) 25 MG tablet; Take 0.5 tablets (12.5 mg total) by mouth 2 (two) times daily.  Gastroesophageal reflux disease without esophagitis  Acquired hypothyroidism -     levothyroxine (SYNTHROID, LEVOTHROID) 125 MCG tablet; Take 1 tablet (125 mcg total) by mouth daily.  Brachial neuritis or radiculitis  Hyperlipidemia associated with type 2 diabetes mellitus (HCC) -     Comprehensive metabolic panel -     Lipid panel -     rosuvastatin (CRESTOR) 40 MG tablet; Take 1 tablet (40 mg total) by mouth daily. NEED OV.  Encounter for hepatitis C screening test for low risk patient -     Hepatitis C antibody  Need for diphtheria-tetanus-pertussis (Tdap) vaccine -     Tdap (BOOSTRIX) 5-2.5-18.5 LF-MCG/0.5 injection; Inject 0.5 mLs into the muscle once for 1 dose.    . Reviewed expectations re: course of current medical issues. . Discussed self-management of symptoms. . Outlined signs and symptoms indicating need for more acute intervention. . Patient verbalized understanding and all questions were answered. Marland Kitchen Health Maintenance issues including appropriate healthy diet, exercise, and smoking  avoidance were discussed with patient. . See orders for this visit as documented in the electronic medical record. . Patient received an After Visit Summary.  Briscoe Deutscher, DO [place] [place] 07/07/2018  Future Appointments  Date Time Provider [place]  01/12/2019 10:40 AM Briscoe Deutscher, DO [facility]    CMA served as scribe during this visit. History, Physical, and Plan performed by medical provider. The above documentation has been reviewed and is accurate and complete. Briscoe Deutscher, D.O.

## 2018-07-08 LAB — HEPATITIS C ANTIBODY
Hepatitis C Ab: NONREACTIVE
SIGNAL TO CUT-OFF: 0.01 (ref <1.00)

## 2018-08-13 ENCOUNTER — Ambulatory Visit: Payer: Medicare HMO | Admitting: Internal Medicine

## 2018-09-18 ENCOUNTER — Ambulatory Visit: Payer: Medicare HMO | Admitting: Cardiology

## 2018-10-04 NOTE — Progress Notes (Addendum)
HPI The patient presents for follow up of CAD.  He is status post remote PCI to his RCA in 2001, HL, DM2, hypothyroidism, GERD. Cardiac cath in  06/01/12 and demonstrated 3 vessel CAD with preserved LV function. CABG was recommended. He was taken off of Plavix and discharged to home and brought back on 06/09/12. He underwent CABG with Dr. Servando Snare with grafts including LIMA-LAD, SVG-OM1 and distal circumflex, SVG-distal RCA. Postop course was fairly uneventful.    Since I last saw him he has done well except for episodes of fatigue.  This has been going on sporadically.  He will feel fatigued in the afternoon.  This was going on more over the last several months but seems to be better in the last couple of weeks.  Been getting some twinges of chest discomfort.  He has not been going to the gym as regularly because his wife has stage IV lung cancer.  He was on elliptical a couple of weeks ago and he does not get chest pressure, neck or arm discomfort.  Is not describing palpitations, presyncope or syncope.  He describes a vague nervous feeling in his chest.  No Known Allergies  Current Outpatient Medications  Medication Sig Dispense Refill  . aspirin EC 81 MG tablet Take 81 mg by mouth daily.    . clindamycin (CLINDAGEL) 1 % gel APPLY THIN LAYER TOPICALLY TO AFFECTED AREA OF BEARD TWICE DAILY AS NEEDED  99  . levothyroxine (SYNTHROID, LEVOTHROID) 125 MCG tablet Take 1 tablet (125 mcg total) by mouth daily. 90 tablet 1  . metFORMIN (GLUCOPHAGE-XR) 500 MG 24 hr tablet TAKE ONE TABLET BY MOUTH ONCE DAILY WITH  BREAKFAST 90 tablet 1  . metoprolol tartrate (LOPRESSOR) 25 MG tablet Take 0.5 tablets (12.5 mg total) by mouth 2 (two) times daily. 90 tablet 1  . rosuvastatin (CRESTOR) 40 MG tablet Take 1 tablet (40 mg total) by mouth daily. NEED OV. 90 tablet 0   No current facility-administered medications for this visit.     Past Medical History:  Diagnosis Date  . CAD (coronary artery disease), s/p MI -  s/p PCI to RCA 2001;  [facility] 7/13: 3v CAD, good LVF - >  s/p CABG 7/13 (L-LAD, S-OM1/dCFX, S-dRCA)   . Diabetes mellitus type 2, controlled ([place])   . Diverticulosis   . Early cataracts, bilateral   . GERD 09/01/2007  . H/O hiatal hernia   . HLD (hyperlipidemia), on statin   . Hypothyroidism. on Synthroid   . MI, old   . Neck pain, pinched nerv   . Nocturia   . PUD (peptic ulcer disease)   . Sleep apnea, patient denies   . Testosterone deficiency     Past Surgical History:  Procedure Laterality Date  . arm surgery  1957   d/t broken  . CARDIAC CATHETERIZATION  2005; 06/01/2012  . COLONOSCOPY  2005  . CORONARY ANGIOPLASTY WITH STENT PLACEMENT  2001   RCA-1 stent  . CORONARY ARTERY BYPASS GRAFT  06/09/2012   Procedure: CORONARY ARTERY BYPASS GRAFTING (CABG);  Surgeon: Grace Isaac, MD;  Location: [facility];  Service: Open Heart Surgery;  Laterality: N/A;  CABG x four;  using left internal mammary artery and right leg greater saphenous vein harvested endoscopically  . ESOPHAGOGASTRODUODENOSCOPY    . PERCUTANEOUS CORONARY STENT INTERVENTION (PCI-S) N/A 06/01/2012   Procedure: PERCUTANEOUS CORONARY STENT INTERVENTION (PCI-S);  Surgeon: Burnell Blanks, MD;  Location: [facility] CATH LAB;  Service: Cardiovascular;  Laterality: N/A;  .  TONSILLECTOMY AND ADENOIDECTOMY  1955   at age 76    ROS:   As stated in the HPI and negative for all other systems.   PHYSICAL EXAM BP (!) 112/58 (BP Location: Left Arm, Patient Position: Sitting, Cuff Size: Normal)   Pulse 75   Ht 5\' 8"  (1.727 m)   Wt 205 lb (93 kg)   BMI 31.17 kg/m  GENERAL:  Well appearing NECK:  No jugular venous distention, waveform within normal limits, carotid upstroke brisk and symmetric, no bruits, no thyromegaly LUNGS:  Clear to auscultation bilaterally CHEST:  Well healed sternotomy scar. HEART:  PMI not displaced or sustained,S1 and S2 within normal limits, no S3, no S4, no clicks, no rubs, soft apical systolic murmur ABD:   Flat, positive bowel sounds normal in frequency in pitch, no bruits, no rebound, no guarding, no midline    Lab Results  Component Value Date   HGBA1C 6.2 (A) 07/07/2018   Lab Results  Component Value Date   CHOL 143 07/07/2018   TRIG 177.0 (H) 07/07/2018   HDL 36.70 (L) 07/07/2018   LDLCALC 71 07/07/2018    EKG:  Sinus rhythm, rate 75, axis with in normal limits, intervals within normal limits, no acute ST-T wave changes.  10/05/2018  ASSESSMENT AND PLAN  CAD  Patient has some vague symptoms of fatigue and other sensations.  His bypass grafts are now 74 years old. I will bring the patient back for a POET (Plain Old Exercise Test). This will allow me to screen for obstructive coronary disease, risk stratify and very importantly provide a prescription for exercise.  DM His A1c is at target as above.  No change in therapy.  Hyperlipidemia  His LDL was at target as above.  No change in therapy.  HTN The blood pressure is at target. No change in medications is indicated. We will continue with therapeutic lifestyle changes (TLC).

## 2018-10-05 ENCOUNTER — Encounter: Payer: Self-pay | Admitting: Cardiology

## 2018-10-05 ENCOUNTER — Ambulatory Visit: Payer: Medicare HMO | Admitting: Cardiology

## 2018-10-05 VITALS — BP 112/58 | HR 75 | Ht 68.0 in | Wt 205.0 lb

## 2018-10-05 DIAGNOSIS — I152 Hypertension secondary to endocrine disorders: Secondary | ICD-10-CM | POA: Insufficient documentation

## 2018-10-05 DIAGNOSIS — I2510 Atherosclerotic heart disease of native coronary artery without angina pectoris: Secondary | ICD-10-CM | POA: Diagnosis not present

## 2018-10-05 DIAGNOSIS — I10 Essential (primary) hypertension: Secondary | ICD-10-CM

## 2018-10-05 DIAGNOSIS — E1159 Type 2 diabetes mellitus with other circulatory complications: Secondary | ICD-10-CM | POA: Insufficient documentation

## 2018-10-05 DIAGNOSIS — E118 Type 2 diabetes mellitus with unspecified complications: Secondary | ICD-10-CM

## 2018-10-05 DIAGNOSIS — E785 Hyperlipidemia, unspecified: Secondary | ICD-10-CM

## 2018-10-05 NOTE — Patient Instructions (Signed)
Medication Instructions:  Continue current medications  If you need a refill on your cardiac medications before your next appointment, please call your pharmacy.  Labwork: None Ordered   If you have labs (blood work) drawn today and your tests are completely normal, you will receive your results only by: Marland Kitchen MyChart Message (if you have MyChart) OR . A paper copy in the mail If you have any lab test that is abnormal or we need to change your treatment, we will call you to review the results.  Testing/Procedures: Your physician has requested that you have an exercise tolerance test. For further information please visit [email or web address]. Please also follow instruction sheet, as given.   Follow-Up: You will need a follow up appointment in 1 Year.  Please call our office 2 months in advance([phone]) to schedule the (1 Year) appointment.  You may see  DR Percival Spanish or one of the following Advanced Practice Providers on your designated Care Team:   . Jory Sims, DNP, ANP . Rhonda Barrett, PA-C .  Marland Kitchen Kerin Ransom, PA-C . Daleen Snook Kroeger, PA-C . Sande Rives, PA-C .  Marland Kitchen Almyra Deforest, PA-C . Fabian Sharp, PA-C  At [facility], you and your health needs are our priority.  As part of our continuing mission to provide you with exceptional heart care, we have created designated Provider Care Teams.  These Care Teams include your primary Cardiologist (physician) and Advanced Practice Providers (APPs -  Physician Assistants and Nurse Practitioners) who all work together to provide you with the care you need, when you need it.   Thank you for choosing [facility] at [facility]!!

## 2018-10-13 ENCOUNTER — Other Ambulatory Visit: Payer: Self-pay | Admitting: Internal Medicine

## 2018-10-14 ENCOUNTER — Other Ambulatory Visit: Payer: Self-pay | Admitting: Internal Medicine

## 2018-10-15 ENCOUNTER — Other Ambulatory Visit: Payer: Self-pay

## 2018-10-15 DIAGNOSIS — R69 Illness, unspecified: Secondary | ICD-10-CM | POA: Diagnosis not present

## 2018-10-15 MED ORDER — GLUCOSE BLOOD VI STRP: ORAL_STRIP | 3 refills | Status: DC

## 2018-10-22 ENCOUNTER — Other Ambulatory Visit: Payer: Self-pay

## 2018-10-22 DIAGNOSIS — E118 Type 2 diabetes mellitus with unspecified complications: Secondary | ICD-10-CM

## 2018-10-22 MED ORDER — GLUCOSE BLOOD VI STRP: ORAL_STRIP | 3 refills | Status: DC

## 2018-10-24 DIAGNOSIS — R69 Illness, unspecified: Secondary | ICD-10-CM | POA: Diagnosis not present

## 2018-11-06 ENCOUNTER — Telehealth (HOSPITAL_COMMUNITY): Payer: Self-pay

## 2018-11-06 NOTE — Telephone Encounter (Signed)
Encounter complete. 

## 2018-11-11 ENCOUNTER — Ambulatory Visit (HOSPITAL_COMMUNITY)
Admission: RE | Admit: 2018-11-11 | Discharge: 2018-11-11 | Disposition: A | Discharge status: Home / Self Care | Payer: Medicare HMO | Source: Ambulatory Visit | Attending: Cardiovascular Disease | Admitting: Cardiovascular Disease

## 2018-11-11 DIAGNOSIS — I2510 Atherosclerotic heart disease of native coronary artery without angina pectoris: Secondary | ICD-10-CM | POA: Diagnosis not present

## 2018-11-11 LAB — EXERCISE TOLERANCE TEST
CHL CUP MPHR: 146 {beats}/min
CSEPED: 6 min
CSEPEDS: 30 s
CSEPPHR: 133 {beats}/min
Estimated workload: 7.7 METS
Percent HR: 91 %
RPE: 19
Rest HR: 82 {beats}/min

## 2018-11-20 ENCOUNTER — Ambulatory Visit (INDEPENDENT_AMBULATORY_CARE_PROVIDER_SITE_OTHER): Payer: Medicare HMO

## 2018-11-20 DIAGNOSIS — Z23 Encounter for immunization: Secondary | ICD-10-CM | POA: Diagnosis not present

## 2018-12-22 DIAGNOSIS — H524 Presbyopia: Secondary | ICD-10-CM | POA: Diagnosis not present

## 2018-12-22 LAB — HM DIABETES EYE EXAM

## 2018-12-24 ENCOUNTER — Encounter: Payer: Self-pay | Admitting: Family Medicine

## 2019-01-12 ENCOUNTER — Ambulatory Visit (INDEPENDENT_AMBULATORY_CARE_PROVIDER_SITE_OTHER): Payer: Medicare HMO | Admitting: Family Medicine

## 2019-01-12 ENCOUNTER — Encounter: Payer: Self-pay | Admitting: Family Medicine

## 2019-01-12 VITALS — BP 118/60 | HR 83 | Temp 98.0°F | Ht 68.0 in | Wt 206.6 lb

## 2019-01-12 DIAGNOSIS — E118 Type 2 diabetes mellitus with unspecified complications: Secondary | ICD-10-CM | POA: Diagnosis not present

## 2019-01-12 DIAGNOSIS — L821 Other seborrheic keratosis: Secondary | ICD-10-CM

## 2019-01-12 DIAGNOSIS — E1169 Type 2 diabetes mellitus with other specified complication: Secondary | ICD-10-CM

## 2019-01-12 DIAGNOSIS — Z79899 Other long term (current) drug therapy: Secondary | ICD-10-CM

## 2019-01-12 DIAGNOSIS — E785 Hyperlipidemia, unspecified: Secondary | ICD-10-CM

## 2019-01-12 DIAGNOSIS — E039 Hypothyroidism, unspecified: Secondary | ICD-10-CM

## 2019-01-12 LAB — CBC WITH DIFFERENTIAL/PLATELET
Basophils Absolute: 0 10*3/uL (ref 0.0–0.1)
Basophils Relative: 0.7 % (ref 0.0–3.0)
Eosinophils Absolute: 0.1 10*3/uL (ref 0.0–0.7)
Eosinophils Relative: 2.4 % (ref 0.0–5.0)
HCT: 45.1 % (ref 39.0–52.0)
Hemoglobin: 15.5 g/dL (ref 13.0–17.0)
Lymphocytes Relative: 25 % (ref 12.0–46.0)
Lymphs Abs: 1.4 10*3/uL (ref 0.7–4.0)
MCHC: 34.3 g/dL (ref 30.0–36.0)
MCV: 89.5 fl (ref 78.0–100.0)
Monocytes Absolute: 0.4 10*3/uL (ref 0.1–1.0)
Monocytes Relative: 7.9 % (ref 3.0–12.0)
Neutro Abs: 3.5 10*3/uL (ref 1.4–7.7)
Neutrophils Relative %: 64 % (ref 43.0–77.0)
Platelets: 153 10*3/uL (ref 150.0–400.0)
RBC: 5.04 Mil/uL (ref 4.22–5.81)
RDW: 12.9 % (ref 11.5–15.5)
WBC: 5.5 10*3/uL (ref 4.0–10.5)

## 2019-01-12 LAB — VITAMIN B12: Vitamin B-12: 204 pg/mL — ABNORMAL LOW (ref 211–911)

## 2019-01-12 LAB — LIPID PANEL
Cholesterol: 121 mg/dL (ref 0–200)
HDL: 37 mg/dL — ABNORMAL LOW (ref >39.00)
LDL Cholesterol: 68 mg/dL (ref 0–99)
NonHDL: 83.88
Total CHOL/HDL Ratio: 3
Triglycerides: 80 mg/dL (ref 0.0–149.0)
VLDL: 16 mg/dL (ref 0.0–40.0)

## 2019-01-12 LAB — POCT GLYCOSYLATED HEMOGLOBIN (HGB A1C): Hemoglobin A1C: 6.4 % — AB (ref 4.0–5.6)

## 2019-01-12 LAB — COMPREHENSIVE METABOLIC PANEL
ALT: 20 U/L (ref 0–53)
AST: 19 U/L (ref 0–37)
Albumin: 4.3 g/dL (ref 3.5–5.2)
Alkaline Phosphatase: 56 U/L (ref 39–117)
BUN: 13 mg/dL (ref 6–23)
CO2: 26 mEq/L (ref 19–32)
Calcium: 9.2 mg/dL (ref 8.4–10.5)
Chloride: 106 mEq/L (ref 96–112)
Creatinine, Ser: 0.94 mg/dL (ref 0.40–1.50)
GFR: 78.38 mL/min (ref >60.00)
Glucose, Bld: 134 mg/dL — ABNORMAL HIGH (ref 70–99)
Potassium: 4.1 mEq/L (ref 3.5–5.1)
Sodium: 141 mEq/L (ref 135–145)
Total Bilirubin: 0.7 mg/dL (ref 0.2–1.2)
Total Protein: 6.8 g/dL (ref 6.0–8.3)

## 2019-01-12 LAB — TSH: TSH: 0.08 u[IU]/mL — ABNORMAL LOW (ref 0.35–4.50)

## 2019-01-12 LAB — T4, FREE: Free T4: 1.2 ng/dL (ref 0.60–1.60)

## 2019-01-12 NOTE — Progress Notes (Signed)
Adrian Gamble is a 73 y.o. male is here for follow up.  History of Present Illness:   Adrian Gamble, CMA acting as scribe for Dr. Briscoe Deutscher.   HPI:   1. Type 2 diabetes mellitus with complication, without long-term current use of insulin ([place]).  Patient is compliant with medications and without side effects.  He admits that he enjoyed too many sweet foods during holidays.  He denies any headaches, dizziness, chest pain, shortness of breath, or lower extremity edema.   2. Hyperlipidemia associated with type 2 diabetes mellitus ([place]), on Crestor.  Recent ETT with cardiology that was reassuring.   3. Acquired hypothyroidism.  Feels euthyroid.  Denies chest pain, palpitations, shortness of breath, fatigue, skin changes, bowel changes.   Also, he has an upcoming ophthalmologic surgery for cataracts.  Health Maintenance Due  Topic Date Due  . TETANUS/TDAP  08/31/2017  . HEMOGLOBIN A1C  01/07/2019   Depression screen [facility] 2/9 01/12/2019 07/07/2018 08/12/2016  Decreased Interest 0 0 0  Down, Depressed, Hopeless 0 0 0  PHQ - 2 Score 0 0 0  Altered sleeping 1 0 -  Tired, decreased energy 1 0 -  Change in appetite 0 0 -  Feeling bad or failure about yourself  0 0 -  Trouble concentrating 1 0 -  Moving slowly or fidgety/restless 0 0 -  Suicidal thoughts 0 0 -  PHQ-9 Score 3 0 -  Difficult doing work/chores Not difficult at all Not difficult at all -   PMHx, SurgHx, SocialHx, FamHx, Medications, and Allergies were reviewed in the Visit Navigator and updated as appropriate.   Patient Active Problem List   Diagnosis Date Noted  . Essential hypertension 10/05/2018  . Type 2 diabetes mellitus with complication, without long-term current use of insulin ([place]) 08/12/2016  . Hypothyroidism 05/29/2009  . 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 Testosterone deficiency 09/01/2007  . Hyperlipidemia associated with type 2 diabetes mellitus ([place]), on Crestor 09/01/2007  . Coronary  atherosclerosis, s/p MI - s/p PCI to RCA 2001;  LHC 7/13: 3v CAD, good LVF - >  s/p CABG 7/13 (L-LAD, S-OM1/dCFX, S-dRCA) 09/01/2007  . GERD 09/01/2007  . Peptic ulcer 09/01/2007   Social History   Tobacco Use  . Smoking status: Former Smoker    Types: Pipe, Cigars    Last attempt to quit: 06/01/2012    Years since quitting: 6.6  . Smokeless tobacco: Never Used  . Tobacco comment: patient states that he is not somking the pipe or cigars any more  Substance Use Topics  . Alcohol use: Yes    Comment: rare  . Drug use: No   Current Medications and Allergies   .  aspirin EC 81 MG tablet, Take 81 mg by mouth daily., Disp: , Rfl:  .  clindamycin (CLINDAGEL) 1 % gel, APPLY THIN LAYER TOPICALLY TO AFFECTED AREA OF BEARD TWICE DAILY AS NEEDED, Disp: , Rfl: 99 .  glucose blood test strip, Check Blood sugars 2-4 times a day as directed., Disp: 100 each, Rfl: 3 .  levothyroxine (SYNTHROID, LEVOTHROID) 125 MCG tablet, Take 1 tablet (125 mcg total) by mouth daily., Disp: 90 tablet, Rfl: 1 .  metFORMIN (GLUCOPHAGE-XR) 500 MG 24 hr tablet, TAKE ONE TABLET BY MOUTH ONCE DAILY WITH  BREAKFAST, Disp: 90 tablet, Rfl: 1 .  metoprolol tartrate (LOPRESSOR) 25 MG tablet, Take 0.5 tablets (12.5 mg total) by mouth 2 (two) times daily., Disp: 90 tablet, Rfl: 1 .  rosuvastatin (CRESTOR) 40  MG tablet, Take 1 tablet (40 mg total) by mouth daily. NEED OV., Disp: 90 tablet, Rfl: 0  No Known Allergies   Review of Systems   Pertinent items are noted in the HPI. Otherwise, a complete ROS is negative.  Vitals   Vitals:   01/12/19 1027  BP: 118/60  Pulse: 83  Temp: 98 F (36.7 C)  TempSrc: Oral  SpO2: 95%  Weight: 206 lb 9.6 oz (93.7 kg)  Height: 5\' 8"  (1.727 m)     Body mass index is 31.41 kg/m.  Physical Exam   Physical Exam Vitals signs and nursing note reviewed.  Constitutional:      General: He is not in acute distress.    Appearance: He is well-developed.  HENT:     Head: Normocephalic and  atraumatic.     Right Ear: External ear normal.     Left Ear: External ear normal.     Nose: Nose normal.  Eyes:     Conjunctiva/sclera: Conjunctivae normal.     Pupils: Pupils are equal, round, and reactive to light.  Neck:     Musculoskeletal: Neck supple.  Cardiovascular:     Rate and Rhythm: Normal rate and regular rhythm.  Pulmonary:     Effort: Pulmonary effort is normal.  Abdominal:     General: Bowel sounds are normal.     Palpations: Abdomen is soft.  Musculoskeletal: Normal range of motion.  Skin:    General: Skin is warm.     Comments: SK right temple.   Neurological:     Mental Status: He is alert.  Psychiatric:        Behavior: Behavior normal.    Results for orders placed or performed in visit on 01/12/19  POCT glycosylated hemoglobin (Hb A1C)  Result Value Ref Range   Hemoglobin A1C 6.4 (A) 4.0 - 5.6 %   HbA1c POC (<> result, manual entry)     HbA1c, POC (prediabetic range)     HbA1c, POC (controlled diabetic range)     Assessment and Plan   Adrian Gamble was seen today for follow-up.  Diagnoses and all orders for this visit:  Type 2 diabetes mellitus with complication, without long-term current use of insulin (HCC) -     POCT glycosylated hemoglobin (Hb A1C) -     Comprehensive metabolic panel -     CBC with Differential/Platelet  Hyperlipidemia associated with type 2 diabetes mellitus ([place]), on Crestor -     Lipid panel  Acquired hypothyroidism -     TSH -     T4, free  Medication management -     Vitamin B12  SK (seborrheic keratosis)   . Orders and follow up as documented in [place], reviewed diet, exercise and weight control, cardiovascular risk and specific lipid/LDL goals reviewed, reviewed medications and side effects in detail.  . Reviewed expectations re: course of current medical issues. . Outlined signs and symptoms indicating need for more acute intervention. . Patient verbalized understanding and all questions were  answered. . Patient received an After Visit Summary.   Briscoe Deutscher, DO [place], [facility] 01/12/2019

## 2019-01-14 ENCOUNTER — Telehealth: Payer: Self-pay | Admitting: Family Medicine

## 2019-01-14 NOTE — Telephone Encounter (Signed)
Copied from [place] [id number]. Topic: General - Other >> Jan 14, 2019  9:11 AM Keene Breath wrote: Reason for CRM: Patient called to inform the doctor that he has swelling below the eye that he had come in to see her about earlier in the week.  Patient wanted to know should he be concerned and what she should he do.  Please advise and call patient back at [phone]

## 2019-01-14 NOTE — Telephone Encounter (Signed)
See note

## 2019-01-14 NOTE — Telephone Encounter (Signed)
Called patient have made appointment with sam for tomorrow. Will go to ED if any vision changes or increased issues before then.

## 2019-01-15 ENCOUNTER — Encounter: Payer: Self-pay | Admitting: Physician Assistant

## 2019-01-15 ENCOUNTER — Ambulatory Visit (INDEPENDENT_AMBULATORY_CARE_PROVIDER_SITE_OTHER): Payer: Medicare HMO | Admitting: Physician Assistant

## 2019-01-15 VITALS — BP 120/72 | HR 71 | Temp 98.5°F | Ht 68.0 in | Wt 207.0 lb

## 2019-01-15 DIAGNOSIS — H02843 Edema of right eye, unspecified eyelid: Secondary | ICD-10-CM | POA: Diagnosis not present

## 2019-01-15 NOTE — Patient Instructions (Signed)
Use ice packs as discussed to help with your swelling.    Contact a doctor if:  You have a fever.  You do not get better after 1-2 days. Get help right away if:  You have a fever and your symptoms get worse all of a sudden.  You have very bad pain when you move your eye.  Your face: ? Hurts. ? Is red. ? Is swollen.  You have sudden loss of vision. This information is not intended to replace advice given to you by your health care provider. Make sure you discuss any questions you have with your health care provider. Document Released: 08/27/2008 Document Revised: 06/24/2018 Document Reviewed: 06/24/2018 Elsevier Interactive Patient Education  2019 [employer].

## 2019-01-15 NOTE — Progress Notes (Signed)
Adrian Gamble is a 73 y.o. male here for a new problem.  I acted as a Education administrator for [employer], PA-C Anselmo Pickler, LPN  History of Present Illness:   Chief Complaint  Patient presents with  . Edema Right eye    HPI  Pt having edema of right eye since yesterday morning. Pt had a lesion frozen right temporal area by Dr. Juleen China on Tuesday. Pt denies pain, slight watery drainage yesterday from eye. He has history of shingles in R eye about 4 years ago -- cleared up without significant residual eye issues. Denies vision changes, pain with moving eye around or any significant tenderness.  He wears glasses prn for reading. Has put polysporin on area x 1. Has no issues with tolerating this in the past. Has has no issues with having area frozen in the past.  Past Medical History:  Diagnosis Date  . CAD (coronary artery disease), s/p MI - s/p PCI to RCA 2001;  LHC 7/13: 3v CAD, good LVF - >  s/p CABG 7/13 (L-LAD, S-OM1/dCFX, S-dRCA)   . Diabetes mellitus type 2, controlled ([place])   . Diverticulosis   . Early cataracts, bilateral   . GERD 09/01/2007  . H/O hiatal hernia   . HLD (hyperlipidemia), on statin   . Hypothyroidism. on Synthroid   . MI, old   . Neck pain, pinched nerv   . Nocturia   . PUD (peptic ulcer disease)   . Sleep apnea, patient denies   . Testosterone deficiency      Social History   Socioeconomic History  . Marital status: Married    Spouse name: Not on file  . Number of children: Not on file  . Years of education: Not on file  . Highest education level: Not on file  Occupational History  . Occupation: Retired  Scientific laboratory technician  . Financial resource strain: Not on file  . Food insecurity:    Worry: Not on file    Inability: Not on file  . Transportation needs:    Medical: Not on file    Non-medical: Not on file  Tobacco Use  . Smoking status: Former Smoker    Types: Pipe, Cigars    Last attempt to quit: 06/01/2012    Years since quitting: 6.6  . Smokeless  tobacco: Never Used  . Tobacco comment: patient states that he is not somking the pipe or cigars any more  Substance and Sexual Activity  . Alcohol use: Yes    Comment: rare  . Drug use: No  . Sexual activity: Yes  Lifestyle  . Physical activity:    Days per week: Not on file    Minutes per session: Not on file  . Stress: Not on file  Relationships  . Social connections:    Talks on phone: Not on file    Gets together: Not on file    Attends religious service: Not on file    Active member of club or organization: Not on file    Attends meetings of clubs or organizations: Not on file    Relationship status: Not on file  . Intimate partner violence:    Fear of current or ex partner: Not on file    Emotionally abused: Not on file    Physically abused: Not on file    Forced sexual activity: Not on file  Other Topics Concern  . Not on file  Social History Narrative  . Not on file    Past Surgical History:  Procedure Laterality Date  . arm surgery  1957   d/t broken  . CARDIAC CATHETERIZATION  2005; 06/01/2012  . COLONOSCOPY  2005  . CORONARY ANGIOPLASTY WITH STENT PLACEMENT  2001   RCA-1 stent  . CORONARY ARTERY BYPASS GRAFT  06/09/2012   Procedure: CORONARY ARTERY BYPASS GRAFTING (CABG);  Surgeon: Grace Isaac, MD;  Location: [facility];  Service: Open Heart Surgery;  Laterality: N/A;  CABG x four;  using left internal mammary artery and right leg greater saphenous vein harvested endoscopically  . ESOPHAGOGASTRODUODENOSCOPY    . PERCUTANEOUS CORONARY STENT INTERVENTION (PCI-S) N/A 06/01/2012   Procedure: PERCUTANEOUS CORONARY STENT INTERVENTION (PCI-S);  Surgeon: Burnell Blanks, MD;  Location: [facility] CATH LAB;  Service: Cardiovascular;  Laterality: N/A;  . [place]   at age 49    Family History  Problem Relation Age of Onset  . Memory loss Father   . Coronary artery disease Father 56       cabg  . Colon cancer Neg Hx     No Known  Allergies  Current Medications:   Current Outpatient Medications:  .  aspirin EC 81 MG tablet, Take 81 mg by mouth daily., Disp: , Rfl:  .  clindamycin (CLINDAGEL) 1 % gel, APPLY THIN LAYER TOPICALLY TO AFFECTED AREA OF BEARD TWICE DAILY AS NEEDED, Disp: , Rfl: 99 .  glucose blood test strip, Check Blood sugars 2-4 times a day as directed., Disp: 100 each, Rfl: 3 .  levothyroxine (SYNTHROID, LEVOTHROID) 125 MCG tablet, Take 1 tablet (125 mcg total) by mouth daily., Disp: 90 tablet, Rfl: 1 .  metFORMIN (GLUCOPHAGE-XR) 500 MG 24 hr tablet, TAKE ONE TABLET BY MOUTH ONCE DAILY WITH  BREAKFAST, Disp: 90 tablet, Rfl: 1 .  metoprolol tartrate (LOPRESSOR) 25 MG tablet, Take 0.5 tablets (12.5 mg total) by mouth 2 (two) times daily., Disp: 90 tablet, Rfl: 1 .  rosuvastatin (CRESTOR) 40 MG tablet, Take 1 tablet (40 mg total) by mouth daily. NEED OV., Disp: 90 tablet, Rfl: 0   Review of Systems:   Review of Systems  Constitutional: Negative for chills, fever, malaise/fatigue and weight loss.  Eyes: Negative for blurred vision, double vision, photophobia, pain, discharge and redness.  Respiratory: Negative for shortness of breath.   Cardiovascular: Negative for chest pain, orthopnea, claudication and leg swelling.  Gastrointestinal: Negative for heartburn, nausea and vomiting.  Neurological: Negative for dizziness, tingling and headaches.    Vitals:   Vitals:   01/15/19 0914  BP: 120/72  Pulse: 71  Temp: 98.5 F (36.9 C)  TempSrc: Oral  SpO2: 95%  Weight: 207 lb (93.9 kg)  Height: 5\' 8"  (1.727 m)     Body mass index is 31.47 kg/m.  Physical Exam:   Physical Exam Vitals signs and nursing note reviewed.  Constitutional:      General: He is not in acute distress.    Appearance: He is well-developed. He is not ill-appearing or toxic-appearing.  HENT:     Head: Normocephalic and atraumatic.     Right Ear: Tympanic membrane, ear canal and external ear normal. Tympanic membrane is not  erythematous, retracted or bulging.     Left Ear: Tympanic membrane, ear canal and external ear normal. Tympanic membrane is not erythematous, retracted or bulging.     Nose: Nose normal.     Right Sinus: No maxillary sinus tenderness or frontal sinus tenderness.     Left Sinus: No maxillary sinus tenderness or frontal sinus tenderness.  Mouth/Throat:     Pharynx: Uvula midline. No posterior oropharyngeal erythema.  Eyes:     General: Lids are normal. Vision grossly intact.     Extraocular Movements: Extraocular movements intact.     Right eye: Normal extraocular motion and no nystagmus.     Left eye: Normal extraocular motion and no nystagmus.     Conjunctiva/sclera: Conjunctivae normal.     Right eye: Right conjunctiva is not injected.     Left eye: Left conjunctiva is not injected.     Comments: No pain elicited with EOM of bilateral eyes No erythema to upper or lower R lid Swelling appreciated to upper inner R lid and lower L lid No induration  Neck:     Trachea: Trachea normal.  Cardiovascular:     Rate and Rhythm: Normal rate and regular rhythm.     Heart sounds: Normal heart sounds, S1 normal and S2 normal.  Pulmonary:     Effort: Pulmonary effort is normal.     Breath sounds: Normal breath sounds. No decreased breath sounds, wheezing, rhonchi or rales.  Lymphadenopathy:     Cervical: No cervical adenopathy.  Skin:    General: Skin is warm and dry.     Comments: Well healing prior frozen lesion to R temple  Neurological:     Mental Status: He is alert.  Psychiatric:        Speech: Speech normal.        Behavior: Behavior normal. Behavior is cooperative.        Assessment and Plan:   Udell was seen today for edema right eye.  Diagnoses and all orders for this visit:  Swelling of right eyelid   No red flags on exam. No significant pain or erythema. EOM intact without elicited pain. Dr. Dimas Chyle also in to evaluate patient. Possible inflammatory reaction  from recent cryo. No concern for obvious infection. Use ice pack compresses and follow-up as needed. ER precautions given and advised.  . Reviewed expectations re: course of current medical issues. . Discussed self-management of symptoms. . Outlined signs and symptoms indicating need for more acute intervention. . Patient verbalized understanding and all questions were answered. . See orders for this visit as documented in the electronic medical record. . Patient received an After-Visit Summary.  CMA or LPN served as scribe during this visit. History, Physical, and Plan performed by medical provider. The above documentation has been reviewed and is accurate and complete.  Inda Coke, PA-C

## 2019-01-18 ENCOUNTER — Ambulatory Visit: Payer: Self-pay | Admitting: *Deleted

## 2019-01-18 NOTE — Telephone Encounter (Signed)
I returned his call.   He is c/o his right elbow being swollen, red and warm to the touch since Friday evening around 6:00 PM.  He has been leaning on his elbows working on the floorboard of an old car.   He doesn't know if that may have caused it otherwise no injuries or accidents.  I made him an appt with Dr. Juleen China for 01/19/2019 at 1:20.    Reason for Disposition . MILD OR MODERATE joint swelling (e.g., feels or looks mildy swollen or puffy)  Answer Assessment - Initial Assessment Questions 1. LOCATION: "Where is the swelling?" (e.g., left, right, both elbows)     I noticed it Wednesday morning but it was not bad.   Now it's really sore and painful.   I barely touch it and it hurts bad. 2. SIZE and DESCRIPTION: "What does the swelling look like?" (e.g., entire elbow, localized)     It was 2/3 way down forearm.  Elbow is less swollen now.   I'm using an ice pack for the last several days.   It feels warm not as bad as it was.   3. ONSET: "When did the swelling start?" "Does it come and go, or is it there all the time?"     Friday evening about 6:00 PM.   No injuries.   I've been working on an older car I noticed a little dot on it.   I've been on my elbows while working on the car.   4. SETTING: "Has there been any recent work, exercise or other activity that involved that part of the body?"      See above 5. AGGRAVATING FACTORS: "What makes the elbow swelling worse?" (e.g., work, sports activities)     It's getting better with ice.   6. ASSOCIATED SYMPTOMS: "Is there any pain or redness?"     Yes but not near as bad as before.   It hurts a little when I move it.   Friday and Saturday it hurt bad. 7. OTHER SYMPTOMS: "Do you have any other symptoms?" (e.g., fever)     No 8. PREGNANCY: "Is there any chance you are pregnant?" "When was your last menstrual period?"     N/A  Protocols used: ELBOW SWELLING-A-AH

## 2019-01-19 ENCOUNTER — Ambulatory Visit (INDEPENDENT_AMBULATORY_CARE_PROVIDER_SITE_OTHER): Payer: Medicare HMO | Admitting: Family Medicine

## 2019-01-19 ENCOUNTER — Encounter: Payer: Self-pay | Admitting: Family Medicine

## 2019-01-19 VITALS — BP 106/62 | HR 77 | Temp 97.7°F | Ht 68.0 in | Wt 205.6 lb

## 2019-01-19 DIAGNOSIS — Z23 Encounter for immunization: Secondary | ICD-10-CM | POA: Diagnosis not present

## 2019-01-19 DIAGNOSIS — M7021 Olecranon bursitis, right elbow: Secondary | ICD-10-CM | POA: Diagnosis not present

## 2019-01-19 DIAGNOSIS — M25529 Pain in unspecified elbow: Secondary | ICD-10-CM | POA: Diagnosis not present

## 2019-01-19 LAB — COMPREHENSIVE METABOLIC PANEL
ALT: 14 U/L (ref 0–53)
AST: 14 U/L (ref 0–37)
Albumin: 4.2 g/dL (ref 3.5–5.2)
Alkaline Phosphatase: 56 U/L (ref 39–117)
BUN: 13 mg/dL (ref 6–23)
CO2: 24 mEq/L (ref 19–32)
Calcium: 9.7 mg/dL (ref 8.4–10.5)
Chloride: 103 mEq/L (ref 96–112)
Creatinine, Ser: 0.89 mg/dL (ref 0.40–1.50)
GFR: 83.47 mL/min (ref >60.00)
Glucose, Bld: 173 mg/dL — ABNORMAL HIGH (ref 70–99)
Potassium: 4.1 mEq/L (ref 3.5–5.1)
Sodium: 137 mEq/L (ref 135–145)
Total Bilirubin: 0.9 mg/dL (ref 0.2–1.2)
Total Protein: 7.1 g/dL (ref 6.0–8.3)

## 2019-01-19 LAB — URIC ACID: Uric Acid, Serum: 3.9 mg/dL — ABNORMAL LOW (ref 4.0–7.8)

## 2019-01-19 MED ORDER — AMOXICILLIN-POT CLAVULANATE 875-125 MG PO TABS: 1.0000 | ORAL_TABLET | Freq: Two times a day (BID) | ORAL | 0 refills | Status: DC

## 2019-01-19 MED ORDER — TETANUS-DIPHTH-ACELL PERTUSSIS 5-2-15.5 LF-MCG/0.5 IM SUSP: 0.5000 mL | Freq: Once | INTRAMUSCULAR | 0 refills | Status: AC

## 2019-01-19 NOTE — Progress Notes (Signed)
Adrian Gamble is a 73 y.o. male here for an acute visit.  History of Present Illness:   HPI: Right elbow pain and swelling. Acute. With some surrounding redness and induration. No Hx of the same. No injury. No known history of gout. He did have an abrasion in the area last week. No systemic symptoms.   PMHx, SurgHx, SocialHx, Medications, and Allergies were reviewed in the Visit Navigator and updated as appropriate.  Current Medications   .  aspirin EC 81 MG tablet, Take 81 mg by mouth daily., Disp: , Rfl:  .  clindamycin (CLINDAGEL) 1 % gel, APPLY THIN LAYER TOPICALLY TO AFFECTED AREA OF BEARD TWICE DAILY AS NEEDED, Disp: , Rfl: 99 .  glucose blood test strip, Check Blood sugars 2-4 times a day as directed., Disp: 100 each, Rfl: 3 .  levothyroxine (SYNTHROID, LEVOTHROID) 125 MCG tablet, Take 1 tablet (125 mcg total) by mouth daily., Disp: 90 tablet, Rfl: 1 .  metFORMIN (GLUCOPHAGE-XR) 500 MG 24 hr tablet, TAKE ONE TABLET BY MOUTH ONCE DAILY WITH  BREAKFAST, Disp: 90 tablet, Rfl: 1 .  metoprolol tartrate (LOPRESSOR) 25 MG tablet, Take 0.5 tablets (12.5 mg total) by mouth 2 (two) times daily., Disp: 90 tablet, Rfl: 1 .  rosuvastatin (CRESTOR) 40 MG tablet, Take 1 tablet (40 mg total) by mouth daily. NEED OV., Disp: 90 tablet, Rfl: 0  No Known Allergies   Review of Systems   Pertinent items are noted in the HPI. Otherwise, ROS is negative.  Vitals   Vitals:   01/19/19 1329  BP: 106/62  Pulse: 77  Temp: 97.7 F (36.5 C)  TempSrc: Oral  SpO2: 97%  Weight: 205 lb 9.6 oz (93.3 kg)  Height: '5\' 8"'  (1.727 m)     Body mass index is 31.26 kg/m.  Physical Exam   Physical Exam Vitals signs and nursing note reviewed.  Constitutional:      General: He is not in acute distress.    Appearance: He is well-developed.  HENT:     Head: Normocephalic and atraumatic.     Right Ear: External ear normal.     Left Ear: External ear normal.     Nose: Nose normal.  Eyes:   Conjunctiva/sclera: Conjunctivae normal.     Pupils: Pupils are equal, round, and reactive to light.  Neck:     Musculoskeletal: Neck supple.  Cardiovascular:     Rate and Rhythm: Normal rate and regular rhythm.  Pulmonary:     Effort: Pulmonary effort is normal.  Abdominal:     General: Bowel sounds are normal.     Palpations: Abdomen is soft.  Musculoskeletal: Normal range of motion.  Skin:    General: Skin is warm.     Comments: Olecranon bursitis on right with mild erythema and induration of surrounding skin. No streaking or lymphadenopathy.   Neurological:     Mental Status: He is alert.  Psychiatric:        Behavior: Behavior normal.    Assessment and Plan   Adrian Gamble was seen today for pain/swelling r elbow.  Diagnoses and all orders for this visit:  Olecranon bursitis of right elbow -     Comp Met (CMET) -     Uric acid -     amoxicillin-clavulanate (AUGMENTIN) 875-125 MG tablet; Take 1 tablet by mouth 2 (two) times daily. -     Tdap (ADACEL) 04-02-14.5 LF-MCG/0.5 injection; Inject 0.5 mLs into the muscle once for 1 dose.  Need for diphtheria-tetanus-pertussis (Tdap)  vaccine -     Tdap (ADACEL) 04-02-14.5 LF-MCG/0.5 injection; Inject 0.5 mLs into the muscle once for 1 dose.    . Reviewed expectations re: course of current medical issues. . Discussed self-management of symptoms. . Outlined signs and symptoms indicating need for more acute intervention. . Patient verbalized understanding and all questions were answered. Marland Kitchen Health Maintenance issues including appropriate healthy diet, exercise, and smoking avoidance were discussed with patient. . See orders for this visit as documented in the electronic medical record. . Patient received an After Visit Summary.  Briscoe Deutscher, DO [place], [facility] 01/28/2019

## 2019-01-28 ENCOUNTER — Encounter: Payer: Self-pay | Admitting: Family Medicine

## 2019-01-29 ENCOUNTER — Other Ambulatory Visit: Payer: Self-pay

## 2019-01-29 ENCOUNTER — Telehealth: Payer: Self-pay | Admitting: Radiology

## 2019-01-29 DIAGNOSIS — E039 Hypothyroidism, unspecified: Secondary | ICD-10-CM

## 2019-01-29 NOTE — Telephone Encounter (Signed)
Lab orders placed for tsh.  Appt on 3/3.

## 2019-01-29 NOTE — Telephone Encounter (Signed)
Please place future lab orders for Pts lab appt on 02/02/2019. Thanks!

## 2019-02-02 ENCOUNTER — Other Ambulatory Visit (INDEPENDENT_AMBULATORY_CARE_PROVIDER_SITE_OTHER): Payer: Medicare HMO

## 2019-02-02 DIAGNOSIS — E039 Hypothyroidism, unspecified: Secondary | ICD-10-CM | POA: Diagnosis not present

## 2019-02-02 LAB — TSH: TSH: 0.1 u[IU]/mL — ABNORMAL LOW (ref 0.35–4.50)

## 2019-02-09 ENCOUNTER — Other Ambulatory Visit: Payer: Self-pay

## 2019-02-09 DIAGNOSIS — E039 Hypothyroidism, unspecified: Secondary | ICD-10-CM

## 2019-02-09 MED ORDER — LEVOTHYROXINE SODIUM 112 MCG PO TABS: 112.0000 ug | ORAL_TABLET | Freq: Every day | ORAL | 3 refills | Status: DC

## 2019-02-09 NOTE — Addendum Note (Signed)
Addended by: Briscoe Deutscher R on: 02/09/2019 06:10 AM   Modules accepted: Orders

## 2019-02-11 DIAGNOSIS — H40039 Anatomical narrow angle, unspecified eye: Secondary | ICD-10-CM | POA: Diagnosis not present

## 2019-02-11 DIAGNOSIS — E119 Type 2 diabetes mellitus without complications: Secondary | ICD-10-CM | POA: Diagnosis not present

## 2019-02-11 DIAGNOSIS — H2511 Age-related nuclear cataract, right eye: Secondary | ICD-10-CM | POA: Diagnosis not present

## 2019-02-11 DIAGNOSIS — H2512 Age-related nuclear cataract, left eye: Secondary | ICD-10-CM | POA: Diagnosis not present

## 2019-02-11 LAB — HM DIABETES EYE EXAM

## 2019-03-02 ENCOUNTER — Other Ambulatory Visit: Payer: Self-pay | Admitting: Family Medicine

## 2019-03-02 DIAGNOSIS — E785 Hyperlipidemia, unspecified: Principal | ICD-10-CM

## 2019-03-02 DIAGNOSIS — E1169 Type 2 diabetes mellitus with other specified complication: Secondary | ICD-10-CM

## 2019-03-05 ENCOUNTER — Encounter: Payer: Self-pay | Admitting: Family Medicine

## 2019-04-27 DIAGNOSIS — Z01818 Encounter for other preprocedural examination: Secondary | ICD-10-CM | POA: Diagnosis not present

## 2019-04-27 DIAGNOSIS — H2512 Age-related nuclear cataract, left eye: Secondary | ICD-10-CM | POA: Diagnosis not present

## 2019-05-03 ENCOUNTER — Other Ambulatory Visit: Payer: Self-pay

## 2019-05-03 ENCOUNTER — Other Ambulatory Visit (INDEPENDENT_AMBULATORY_CARE_PROVIDER_SITE_OTHER): Payer: Medicare HMO

## 2019-05-03 ENCOUNTER — Telehealth: Payer: Self-pay | Admitting: Family Medicine

## 2019-05-03 DIAGNOSIS — E039 Hypothyroidism, unspecified: Secondary | ICD-10-CM | POA: Diagnosis not present

## 2019-05-03 LAB — T4, FREE: Free T4: 1.28 ng/dL (ref 0.60–1.60)

## 2019-05-03 LAB — TSH: TSH: 0.22 u[IU]/mL — ABNORMAL LOW (ref 0.35–4.50)

## 2019-05-03 NOTE — Telephone Encounter (Signed)
I've been watching this as well. Ask him to call his pharmacy to see what they are doing for this now. I'll update him as soon as I see a guideline change.

## 2019-05-03 NOTE — Telephone Encounter (Signed)
Called patent he will call his pharmacy.

## 2019-05-03 NOTE — Telephone Encounter (Signed)
Pt stated that he heard metFORMIN (GLUCOPHAGE-XR) 500 MG 24 hr tablet was recalled and would like to know if he needs to change this med. Please advise.

## 2019-05-04 MED ORDER — LEVOTHYROXINE SODIUM 100 MCG PO TABS: 100.0000 ug | ORAL_TABLET | Freq: Every day | ORAL | 3 refills | Status: DC

## 2019-05-04 NOTE — Progress Notes (Signed)
TSH still too low.  Will lower dose to 100 mcg/day and recheck in 6 weeks.

## 2019-05-06 DIAGNOSIS — H25812 Combined forms of age-related cataract, left eye: Secondary | ICD-10-CM | POA: Diagnosis not present

## 2019-05-06 DIAGNOSIS — H2512 Age-related nuclear cataract, left eye: Secondary | ICD-10-CM | POA: Diagnosis not present

## 2019-05-11 DIAGNOSIS — N529 Male erectile dysfunction, unspecified: Secondary | ICD-10-CM | POA: Diagnosis not present

## 2019-05-11 DIAGNOSIS — I2510 Atherosclerotic heart disease of native coronary artery without angina pectoris: Secondary | ICD-10-CM | POA: Diagnosis not present

## 2019-05-11 DIAGNOSIS — E039 Hypothyroidism, unspecified: Secondary | ICD-10-CM | POA: Diagnosis not present

## 2019-05-11 DIAGNOSIS — I252 Old myocardial infarction: Secondary | ICD-10-CM | POA: Diagnosis not present

## 2019-05-11 DIAGNOSIS — Z7984 Long term (current) use of oral hypoglycemic drugs: Secondary | ICD-10-CM | POA: Diagnosis not present

## 2019-05-11 DIAGNOSIS — Z87892 Personal history of anaphylaxis: Secondary | ICD-10-CM | POA: Diagnosis not present

## 2019-05-11 DIAGNOSIS — Z7982 Long term (current) use of aspirin: Secondary | ICD-10-CM | POA: Diagnosis not present

## 2019-05-11 DIAGNOSIS — E1165 Type 2 diabetes mellitus with hyperglycemia: Secondary | ICD-10-CM | POA: Diagnosis not present

## 2019-05-11 DIAGNOSIS — Z87891 Personal history of nicotine dependence: Secondary | ICD-10-CM | POA: Diagnosis not present

## 2019-05-11 DIAGNOSIS — E785 Hyperlipidemia, unspecified: Secondary | ICD-10-CM | POA: Diagnosis not present

## 2019-05-25 ENCOUNTER — Other Ambulatory Visit: Payer: Self-pay

## 2019-05-25 ENCOUNTER — Telehealth: Payer: Self-pay | Admitting: Physical Therapy

## 2019-05-25 MED ORDER — METFORMIN HCL 500 MG PO TABS: 1000.0000 mg | ORAL_TABLET | Freq: Two times a day (BID) | ORAL | 1 refills | Status: DC

## 2019-05-25 NOTE — Telephone Encounter (Signed)
Copied from [place] [id number]. Topic: General - Other >> May 25, 2019  2:56 PM Rainey Pines A wrote: Patient would like a cllback from nurse in regards to taking the metformin that was prescribed, Patient stated that pharmacy stated medication is recalled for causing cancer.

## 2019-05-25 NOTE — Telephone Encounter (Signed)
Called patient reviewed information and called in refills. Will call if any issues.

## 2019-05-26 ENCOUNTER — Telehealth: Payer: Self-pay | Admitting: Family Medicine

## 2019-05-26 DIAGNOSIS — E119 Type 2 diabetes mellitus without complications: Secondary | ICD-10-CM

## 2019-05-26 NOTE — Telephone Encounter (Signed)
Pharmacy called and stated that they sent a request for Metformin and it was denied. The provider sent the pharmacy an Rx for regular Metformin. Pharmacy states that they now have a new product in and Metformin 500MG  ER version is now ok to fill. Pt wants the ER version for the next refill, for next time

## 2019-05-30 ENCOUNTER — Other Ambulatory Visit: Payer: Self-pay | Admitting: Family Medicine

## 2019-05-30 DIAGNOSIS — E1169 Type 2 diabetes mellitus with other specified complication: Secondary | ICD-10-CM

## 2019-05-30 DIAGNOSIS — E785 Hyperlipidemia, unspecified: Secondary | ICD-10-CM

## 2019-07-01 DIAGNOSIS — H2511 Age-related nuclear cataract, right eye: Secondary | ICD-10-CM | POA: Diagnosis not present

## 2019-07-01 DIAGNOSIS — H25811 Combined forms of age-related cataract, right eye: Secondary | ICD-10-CM | POA: Diagnosis not present

## 2019-07-06 ENCOUNTER — Telehealth: Payer: Self-pay | Admitting: Family Medicine

## 2019-07-06 NOTE — Telephone Encounter (Signed)
Pt is calling and he would like to go back on metformin er 1000 mg. Pt said that med is back on the market. [facility] [address]

## 2019-07-06 NOTE — Telephone Encounter (Signed)
See note

## 2019-07-07 ENCOUNTER — Other Ambulatory Visit: Payer: Self-pay

## 2019-07-07 MED ORDER — METFORMIN HCL ER (MOD) 1000 MG PO TB24: 1000.0000 mg | ORAL_TABLET | Freq: Every day | ORAL | 1 refills | Status: DC

## 2019-07-07 NOTE — Telephone Encounter (Signed)
Called in and let patient know.

## 2019-07-07 NOTE — Telephone Encounter (Signed)
Ok to send in.  

## 2019-07-07 NOTE — Telephone Encounter (Signed)
Yes

## 2019-07-13 ENCOUNTER — Encounter: Payer: Self-pay | Admitting: Family Medicine

## 2019-07-13 ENCOUNTER — Ambulatory Visit (INDEPENDENT_AMBULATORY_CARE_PROVIDER_SITE_OTHER): Payer: Medicare HMO | Admitting: Family Medicine

## 2019-07-13 ENCOUNTER — Other Ambulatory Visit: Payer: Self-pay

## 2019-07-13 VITALS — Ht 68.0 in | Wt 198.0 lb

## 2019-07-13 DIAGNOSIS — M62830 Muscle spasm of back: Secondary | ICD-10-CM

## 2019-07-13 DIAGNOSIS — E118 Type 2 diabetes mellitus with unspecified complications: Secondary | ICD-10-CM

## 2019-07-13 DIAGNOSIS — E039 Hypothyroidism, unspecified: Secondary | ICD-10-CM | POA: Diagnosis not present

## 2019-07-13 DIAGNOSIS — K5790 Diverticulosis of intestine, part unspecified, without perforation or abscess without bleeding: Secondary | ICD-10-CM | POA: Insufficient documentation

## 2019-07-13 DIAGNOSIS — Z87891 Personal history of nicotine dependence: Secondary | ICD-10-CM | POA: Insufficient documentation

## 2019-07-13 DIAGNOSIS — L989 Disorder of the skin and subcutaneous tissue, unspecified: Secondary | ICD-10-CM

## 2019-07-13 MED ORDER — METFORMIN HCL ER 500 MG PO TB24: 500.0000 mg | ORAL_TABLET | Freq: Every day | ORAL | 3 refills | Status: DC

## 2019-07-13 NOTE — Patient Instructions (Signed)

## 2019-07-13 NOTE — Progress Notes (Signed)
Virtual Visit via Video   Due to the COVID-19 pandemic, this visit was completed with telemedicine (audio/video) technology to reduce patient and provider exposure as well as to preserve personal protective equipment.   I connected with Adrian Gamble by a video enabled telemedicine application and verified that I am speaking with the correct person using two identifiers. Location patient: Home Location provider:  [facility] Office Persons participating in the virtual visit: Adrian Gamble, Adrian Deutscher, DO Adrian Gamble, CMA acting as scribe for Dr. Briscoe Gamble.   I discussed the limitations of evaluation and management by telemedicine and the availability of in person appointments. The patient expressed understanding and agreed to proceed.  Care Team   Patient Care Team: Adrian Deutscher, DO as PCP - General (Family Medicine) Grace Isaac, MD as Consulting Physician (Cardiothoracic Surgery) Minus Breeding, MD as Consulting Physician (Cardiology)  Subjective:   HPI:   Sore on head for over 6 months. Middle right side. Has been there for over 6 months. Feels like "small gouge" some times can be tender. Area has not changed in color or size.   Metformin: he is having trouble getting medications changed back to the XR. He is not able to take the IR due to GI issues.   Back pain. Lt side off and on for about three months. Pain is on left side only when siting. Pain is 3/10 with no radiation. Improves with position changes and resolves in minutes.  Review of Systems  Constitutional: Negative for chills and fever.  HENT: Negative for hearing loss and tinnitus.   Eyes: Negative for blurred vision and double vision.  Respiratory: Negative for cough and wheezing.   Cardiovascular: Negative for chest pain and palpitations.  Gastrointestinal: Negative for nausea and vomiting.  Genitourinary: Negative for dysuria and urgency.  Musculoskeletal: Negative for myalgias.  Neurological:  Negative for dizziness and headaches.  Psychiatric/Behavioral: Negative for depression and suicidal ideas.    Patient Active Problem List   Diagnosis Date Noted  . Diverticulosis 07/13/2019  . Former smoker 07/13/2019  . Essential hypertension, Rx Metoprolol 10/05/2018  . Type 2 diabetes mellitus with complication, without long-term current use of insulin ([place]) 08/12/2016  . Hypothyroidism, Rx Levothyroxine 05/29/2009  . 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 Testosterone deficiency 09/01/2007  . Hyperlipidemia associated with type 2 diabetes mellitus ([place]), Rx Crestor 09/01/2007  . 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 GERD 09/01/2007  . Peptic ulcer 09/01/2007    Social History   Tobacco Use  . Smoking status: Former Smoker    Types: Pipe, Cigars    Quit date: 06/01/2012    Years since quitting: 7.1  . Smokeless tobacco: Never Used  . Tobacco comment: patient states that he is not somking the pipe or cigars any more  Substance Use Topics  . Alcohol use: Yes    Comment: rare    Current Outpatient Medications:  .  aspirin EC 81 MG tablet, Take 81 mg by mouth daily., Disp: , Rfl:  .  clindamycin (CLINDAGEL) 1 % gel, APPLY THIN LAYER TOPICALLY TO AFFECTED AREA OF BEARD TWICE DAILY AS NEEDED, Disp: , Rfl: 99 .  glucose blood test strip, Check Blood sugars 2-4 times a day as directed., Disp: 100 each, Rfl: 3 .  levothyroxine (SYNTHROID) 100 MCG tablet, Take 1 tablet (100 mcg total) by mouth daily.,  Disp: 90 tablet, Rfl: 3 .  metoprolol tartrate (LOPRESSOR) 25 MG tablet, Take 0.5 tablets (12.5 mg total) by mouth 2 (two) times daily., Disp: 90 tablet, Rfl: 1 .  rosuvastatin (CRESTOR) 40 MG tablet, TAKE 1 TABLET BY MOUTH ONCE DAILY OFFICE  VISIT  NEEDED, Disp: 90 tablet, Rfl: 0 .  metFORMIN (GLUCOPHAGE-XR) 500 MG 24 hr tablet, Take 1 tablet (500 mg total) by mouth  daily with breakfast., Disp: 90 tablet, Rfl: 3  No Known Allergies  Objective:   VITALS: Per patient if applicable, see vitals. GENERAL: Alert, appears well and in no acute distress. HEENT: Atraumatic, conjunctiva clear, no obvious abnormalities on inspection of external nose and ears. NECK: Normal movements of the head and neck. CARDIOPULMONARY: No increased WOB. Speaking in clear sentences. I:E ratio WNL.  MS: Moves all visible extremities without noticeable abnormality. PSYCH: Pleasant and cooperative, well-groomed. Speech normal rate and rhythm. Affect is appropriate. Insight and judgement are appropriate. Attention is focused, linear, and appropriate.  NEURO: CN grossly intact. Oriented as arrived to appointment on time with no prompting. Moves both UE equally.  SKIN: No obvious lesions, wounds, erythema, or cyanosis noted on face or hands.  Depression screen [facility] 2/9 01/19/2019 01/12/2019 07/07/2018  Decreased Interest 0 0 0  Down, Depressed, Hopeless 0 0 0  PHQ - 2 Score 0 0 0  Altered sleeping 1 1 0  Tired, decreased energy 1 1 0  Change in appetite 0 0 0  Feeling bad or failure about yourself  0 0 0  Trouble concentrating 0 1 0  Moving slowly or fidgety/restless 0 0 0  Suicidal thoughts 0 0 0  PHQ-9 Score 2 3 0  Difficult doing work/chores Not difficult at all Not difficult at all Not difficult at all    Assessment and Plan:   Adrian Gamble was seen today for follow-up.  Diagnoses and all orders for this visit:  Acquired hypothyroidism Comments: Previous lab with low TSH. Adjusted med, but needs lab recheck.  Type 2 diabetes mellitus with complication, without long-term current use of insulin ([facility]) Comments: Patient previously did very well on metformin 500 xr mg p.o. daily.  We will go ahead and refill that. Orders: -     metFORMIN (GLUCOPHAGE-XR) 500 MG 24 hr tablet; Take 1 tablet (500 mg total) by mouth daily with breakfast.  Spasm of muscle of lower  back Comments: Sounds like a simple muscle spasm.  Recommend stretches.  See AVS.  Scalp lesion Comments: Patient will take picture of the lesion and sent through my chart.  If it looks like something that requires cryotherapy, will have him come in.    Marland Kitchen COVID-19 Education: The signs and symptoms of COVID-19 were discussed with the patient and how to seek care for testing if needed. The importance of social distancing was discussed today. . Reviewed expectations re: course of current medical issues. . Discussed self-management of symptoms. . Outlined signs and symptoms indicating need for more acute intervention. . Patient verbalized understanding and all questions were answered. Marland Kitchen Health Maintenance issues including appropriate healthy diet, exercise, and smoking avoidance were discussed with patient. . See orders for this visit as documented in the electronic medical record.  Adrian Deutscher, DO  Records requested if needed. Time spent: 25 minutes, of which >50% was spent in obtaining information about his symptoms, reviewing his previous labs, evaluations, and treatments, counseling him about his condition (please see the discussed topics above), and developing a plan to further investigate it; he  had a number of questions which I addressed.

## 2019-07-15 NOTE — Telephone Encounter (Signed)
Pt is asking for a call back in regards to the photos of his scalp. Please advise

## 2019-07-19 ENCOUNTER — Other Ambulatory Visit (INDEPENDENT_AMBULATORY_CARE_PROVIDER_SITE_OTHER): Payer: Medicare HMO

## 2019-07-19 ENCOUNTER — Other Ambulatory Visit: Payer: Self-pay

## 2019-07-19 DIAGNOSIS — E039 Hypothyroidism, unspecified: Secondary | ICD-10-CM | POA: Diagnosis not present

## 2019-07-19 LAB — T4, FREE: Free T4: 1.33 ng/dL (ref 0.60–1.60)

## 2019-07-19 LAB — TSH: TSH: 1.27 u[IU]/mL (ref 0.35–4.50)

## 2019-08-02 ENCOUNTER — Ambulatory Visit: Payer: Medicare HMO | Admitting: Family Medicine

## 2019-08-05 NOTE — Progress Notes (Deleted)
Virtual Visit via Video   Due to the COVID-19 pandemic, this visit was completed with telemedicine (audio/video) technology to reduce patient and provider exposure as well as to preserve personal protective equipment.   I connected with Adrian Gamble by a video enabled telemedicine application and verified that I am speaking with the correct person using two identifiers. Location patient: Home Location provider: [facility], [facility] Persons participating in the virtual visit: Taurean Babicz, Briscoe Deutscher, DO   I discussed the limitations of evaluation and management by telemedicine and the availability of in person appointments. The patient expressed understanding and agreed to proceed.  Care Team   Patient Care Team: Briscoe Deutscher, DO as PCP - General (Family Medicine) Grace Isaac, MD as Consulting Physician (Cardiothoracic Surgery) Minus Breeding, MD as Consulting Physician (Cardiology)  Subjective:   HPI:   ROS   Patient Active Problem List   Diagnosis Date Noted  . Diverticulosis 07/13/2019  . Former smoker 07/13/2019  . Essential hypertension, Rx Metoprolol 10/05/2018  . Type 2 diabetes mellitus with complication, without long-term current use of insulin ([place]) 08/12/2016  . Hypothyroidism, Rx Levothyroxine 05/29/2009  . 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 Testosterone deficiency 09/01/2007  . Hyperlipidemia associated with type 2 diabetes mellitus ([place]), Rx Crestor 09/01/2007  . 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 GERD 09/01/2007  . Peptic ulcer 09/01/2007    Social History   Tobacco Use  . Smoking status: Former Smoker    Types: Pipe, Cigars    Quit date: 06/01/2012    Years since quitting: 7.1  . Smokeless tobacco: Never Used  . Tobacco comment: patient states that he is not somking the pipe or cigars any more  Substance Use Topics    . Alcohol use: Yes    Comment: rare    Current Outpatient Medications:  .  aspirin EC 81 MG tablet, Take 81 mg by mouth daily., Disp: , Rfl:  .  clindamycin (CLINDAGEL) 1 % gel, APPLY THIN LAYER TOPICALLY TO AFFECTED AREA OF BEARD TWICE DAILY AS NEEDED, Disp: , Rfl: 99 .  glucose blood test strip, Check Blood sugars 2-4 times a day as directed., Disp: 100 each, Rfl: 3 .  levothyroxine (SYNTHROID) 100 MCG tablet, Take 1 tablet (100 mcg total) by mouth daily., Disp: 90 tablet, Rfl: 3 .  metFORMIN (GLUCOPHAGE-XR) 500 MG 24 hr tablet, Take 1 tablet (500 mg total) by mouth daily with breakfast., Disp: 90 tablet, Rfl: 3 .  metoprolol tartrate (LOPRESSOR) 25 MG tablet, Take 0.5 tablets (12.5 mg total) by mouth 2 (two) times daily., Disp: 90 tablet, Rfl: 1 .  rosuvastatin (CRESTOR) 40 MG tablet, TAKE 1 TABLET BY MOUTH ONCE DAILY OFFICE  VISIT  NEEDED, Disp: 90 tablet, Rfl: 0  No Known Allergies  Objective:   VITALS: Per patient if applicable, see vitals. GENERAL: Alert, appears well and in no acute distress. HEENT: Atraumatic, conjunctiva clear, no obvious abnormalities on inspection of external nose and ears. NECK: Normal movements of the head and neck. CARDIOPULMONARY: No increased WOB. Speaking in clear sentences. I:E ratio WNL.  MS: Moves all visible extremities without noticeable abnormality. PSYCH: Pleasant and cooperative, well-groomed. Speech normal rate and rhythm. Affect is appropriate. Insight and judgement are appropriate. Attention is focused, linear, and appropriate.  NEURO: CN grossly intact. Oriented as arrived to appointment on time with no  prompting. Moves both UE equally.  SKIN: No obvious lesions, wounds, erythema, or cyanosis noted on face or hands.  Depression screen [facility] 2/9 01/19/2019 01/12/2019 07/07/2018  Decreased Interest 0 0 0  Down, Depressed, Hopeless 0 0 0  PHQ - 2 Score 0 0 0  Altered sleeping 1 1 0  Tired, decreased energy 1 1 0  Change in appetite 0 0 0   Feeling bad or failure about yourself  0 0 0  Trouble concentrating 0 1 0  Moving slowly or fidgety/restless 0 0 0  Suicidal thoughts 0 0 0  PHQ-9 Score 2 3 0  Difficult doing work/chores Not difficult at all Not difficult at all Not difficult at all    Assessment and Plan:   There are no diagnoses linked to this encounter.  Marland Kitchen COVID-19 Education: The signs and symptoms of COVID-19 were discussed with the patient and how to seek care for testing if needed. The importance of social distancing was discussed today. . Reviewed expectations re: course of current medical issues. . Discussed self-management of symptoms. . Outlined signs and symptoms indicating need for more acute intervention. . Patient verbalized understanding and all questions were answered. Marland Kitchen Health Maintenance issues including appropriate healthy diet, exercise, and smoking avoidance were discussed with patient. . See orders for this visit as documented in the electronic medical record.  Briscoe Deutscher, DO  Records requested if needed. Time spent: *** minutes, of which >50% was spent in obtaining information about his symptoms, reviewing his previous labs, evaluations, and treatments, counseling him about his condition (please see the discussed topics above), and developing a plan to further investigate it; he had a number of questions which I addressed.

## 2019-08-06 ENCOUNTER — Ambulatory Visit: Payer: Medicare HMO | Admitting: Family Medicine

## 2019-08-06 ENCOUNTER — Ambulatory Visit: Payer: Medicare HMO

## 2019-08-12 ENCOUNTER — Telehealth: Payer: Self-pay | Admitting: Family Medicine

## 2019-08-12 ENCOUNTER — Encounter: Payer: Self-pay | Admitting: Family Medicine

## 2019-08-12 ENCOUNTER — Encounter: Payer: Medicare HMO | Admitting: Family Medicine

## 2019-08-12 ENCOUNTER — Other Ambulatory Visit: Payer: Self-pay

## 2019-08-12 ENCOUNTER — Ambulatory Visit (INDEPENDENT_AMBULATORY_CARE_PROVIDER_SITE_OTHER): Payer: Medicare HMO

## 2019-08-12 DIAGNOSIS — Z0000 Encounter for general adult medical examination without abnormal findings: Secondary | ICD-10-CM | POA: Diagnosis not present

## 2019-08-12 NOTE — Patient Instructions (Signed)
Adrian Gamble , Thank you for taking time to come for your Medicare Wellness Visit. I appreciate your ongoing commitment to your health goals. Please review the following plan we discussed and let me know if I can assist you in the future.   Screening recommendations/referrals: Colorectal Screening: completed 05/05/14  Vision and Dental Exams: Recommended annual ophthalmology exams for early detection of glaucoma and other disorders of the eye Recommended annual dental exams for proper oral hygiene  Diabetic Exams: Diabetic Eye Exam: up to date with Dr. Nicki Reaper  Diabetic Foot Exam: at next office visit   Vaccinations: Influenza vaccine:  recommended this fall either at PCP office or through your local pharmacy  Pneumococcal vaccine: completed 02/25/14 Tdap vaccine: Please call your insurance company to determine your out of pocket expense. You may also receive this vaccine at your local pharmacy or Health Dept. Shingles vaccine: Please call your insurance company to determine your out of pocket expense for the Shingrix vaccine. You may receive this vaccine at your local pharmacy.  Advanced directives: Please let us know if we can assist you with advanced directives; forms are available and somewhere is here to assist you in completion if needed   Goals: Recommend to drink at least 6-8 8oz glasses of water per day.  Next appointment: Please schedule your Annual Wellness Visit with your Nurse Health Advisor in one year.  Preventive Care 38 Years and Older, Male Preventive care refers to lifestyle choices and visits with your health care provider that can promote health and wellness. What does preventive care include?  A yearly physical exam. This is also called an annual well check.  Dental exams once or twice a year.  Routine eye exams. Ask your health care provider how often you should have your eyes checked.  Personal lifestyle choices, including:  Daily care of your teeth and  gums.  Regular physical activity.  Eating a healthy diet.  Avoiding tobacco and drug use.  Limiting alcohol use.  Practicing safe sex.  Taking low doses of aspirin every day if recommended by your health care provider..  Taking vitamin and mineral supplements as recommended by your health care provider. What happens during an annual well check? The services and screenings done by your health care provider during your annual well check will depend on your age, overall health, lifestyle risk factors, and family history of disease. Counseling  Your health care provider may ask you questions about your:  Alcohol use.  Tobacco use.  Drug use.  Emotional well-being.  Home and relationship well-being.  Sexual activity.  Eating habits.  History of falls.  Memory and ability to understand (cognition).  Work and work Statistician. Screening  You may have the following tests or measurements:  Height, weight, and BMI.  Blood pressure.  Lipid and cholesterol levels. These may be checked every 5 years, or more frequently if you are over 12 years old.  Skin check.  Lung cancer screening. You may have this screening every year starting at age 56 if you have a 30-pack-year history of smoking and currently smoke or have quit within the past 15 years.  Fecal occult blood test (FOBT) of the stool. You may have this test every year starting at age 49.  Flexible sigmoidoscopy or colonoscopy. You may have a sigmoidoscopy every 5 years or a colonoscopy every 10 years starting at age 70.  Prostate cancer screening. Recommendations will vary depending on your family history and other risks.  Hepatitis C blood test.  Hepatitis B blood test.  Sexually transmitted disease (STD) testing.  Diabetes screening. This is done by checking your blood sugar (glucose) after you have not eaten for a while (fasting). You may have this done every 1-3 years.  Abdominal aortic aneurysm (AAA)  screening. You may need this if you are a current or former smoker.  Osteoporosis. You may be screened starting at age 55 if you are at high risk. Talk with your health care provider about your test results, treatment options, and if necessary, the need for more tests. Vaccines  Your health care provider may recommend certain vaccines, such as:  Influenza vaccine. This is recommended every year.  Tetanus, diphtheria, and acellular pertussis (Tdap, Td) vaccine. You may need a Td booster every 10 years.  Zoster vaccine. You may need this after age 27.  Pneumococcal 13-valent conjugate (PCV13) vaccine. One dose is recommended after age 56.  Pneumococcal polysaccharide (PPSV23) vaccine. One dose is recommended after age 63. Talk to your health care provider about which screenings and vaccines you need and how often you need them. This information is not intended to replace advice given to you by your health care provider. Make sure you discuss any questions you have with your health care provider. Document Released: 12/15/2015 Document Revised: 08/07/2016 Document Reviewed: 09/19/2015 Elsevier Interactive Patient Education  2017 [place] Prevention in the Home Falls can cause injuries. They can happen to people of all ages. There are many things you can do to make your home safe and to help prevent falls. What can I do on the outside of my home?  Regularly fix the edges of walkways and driveways and fix any cracks.  Remove anything that might make you trip as you walk through a door, such as a raised step or threshold.  Trim any bushes or trees on the path to your home.  Use bright outdoor lighting.  Clear any walking paths of anything that might make someone trip, such as rocks or tools.  Regularly check to see if handrails are loose or broken. Make sure that both sides of any steps have handrails.  Any raised decks and porches should have guardrails on the  edges.  Have any leaves, snow, or ice cleared regularly.  Use sand or salt on walking paths during winter.  Clean up any spills in your garage right away. This includes oil or grease spills. What can I do in the bathroom?  Use night lights.  Install grab bars by the toilet and in the tub and shower. Do not use towel bars as grab bars.  Use non-skid mats or decals in the tub or shower.  If you need to sit down in the shower, use a plastic, non-slip stool.  Keep the floor dry. Clean up any water that spills on the floor as soon as it happens.  Remove soap buildup in the tub or shower regularly.  Attach bath mats securely with double-sided non-slip rug tape.  Do not have throw rugs and other things on the floor that can make you trip. What can I do in the bedroom?  Use night lights.  Make sure that you have a light by your bed that is easy to reach.  Do not use any sheets or blankets that are too big for your bed. They should not hang down onto the floor.  Have a firm chair that has side arms. You can use this for support while you get dressed.  Do not  have throw rugs and other things on the floor that can make you trip. What can I do in the kitchen?  Clean up any spills right away.  Avoid walking on wet floors.  Keep items that you use a lot in easy-to-reach places.  If you need to reach something above you, use a strong step stool that has a grab bar.  Keep electrical cords out of the way.  Do not use floor polish or wax that makes floors slippery. If you must use wax, use non-skid floor wax.  Do not have throw rugs and other things on the floor that can make you trip. What can I do with my stairs?  Do not leave any items on the stairs.  Make sure that there are handrails on both sides of the stairs and use them. Fix handrails that are broken or loose. Make sure that handrails are as long as the stairways.  Check any carpeting to make sure that it is firmly  attached to the stairs. Fix any carpet that is loose or worn.  Avoid having throw rugs at the top or bottom of the stairs. If you do have throw rugs, attach them to the floor with carpet tape.  Make sure that you have a light switch at the top of the stairs and the bottom of the stairs. If you do not have them, ask someone to add them for you. What else can I do to help prevent falls?  Wear shoes that:  Do not have high heels.  Have rubber bottoms.  Are comfortable and fit you well.  Are closed at the toe. Do not wear sandals.  If you use a stepladder:  Make sure that it is fully opened. Do not climb a closed stepladder.  Make sure that both sides of the stepladder are locked into place.  Ask someone to hold it for you, if possible.  Clearly mark and make sure that you can see:  Any grab bars or handrails.  First and last steps.  Where the edge of each step is.  Use tools that help you move around (mobility aids) if they are needed. These include:  Canes.  Walkers.  Scooters.  Crutches.  Turn on the lights when you go into a dark area. Replace any light bulbs as soon as they burn out.  Set up your furniture so you have a clear path. Avoid moving your furniture around.  If any of your floors are uneven, fix them.  If there are any pets around you, be aware of where they are.  Review your medicines with your doctor. Some medicines can make you feel dizzy. This can increase your chance of falling. Ask your doctor what other things that you can do to help prevent falls. This information is not intended to replace advice given to you by your health care provider. Make sure you discuss any questions you have with your health care provider. Document Released: 09/14/2009 Document Revised: 04/25/2016 Document Reviewed: 12/23/2014 Elsevier Interactive Patient Education  2017 [employer].

## 2019-08-12 NOTE — Telephone Encounter (Signed)
Patient is calling to reschedule virtual appt for tomorrow. Please advise [phone]

## 2019-08-12 NOTE — Progress Notes (Signed)
ERROR

## 2019-08-12 NOTE — Progress Notes (Signed)
This visit is being conducted via phone call due to the COVID-19 pandemic. This patient has given me verbal consent via phone to conduct this visit, patient states they are participating from their home address. Some vital signs may be absent or patient reported.   Patient identification: identified by name, DOB, and current address.   Subjective:   Adrian Gamble is a 73 y.o. male who presents for Medicare Annual/Subsequent preventive examination.  Review of Systems:   Cardiac Risk Factors include: advanced age (>71men, >65 women);dyslipidemia;hypertension;male gender     Objective:    Vitals: There were no vitals taken for this visit.  There is no height or weight on file to calculate BMI.  Advanced Directives 08/12/2019 04/21/2014 06/10/2012 06/05/2012 06/02/2012 06/01/2012  Does Patient Have a Medical Advance Directive? No Patient does not have advance directive Patient has advance directive, copy not in chart Patient does not have advance directive;Patient would like information Patient does not have advance directive;Patient would not like information Patient does not have advance directive  Type of Advance Directive - - Living will;Healthcare Power of [place] in Chart? - - Copy requested from family - - -  Would patient like information on creating a medical advance directive? No - Patient declined - - Advance directive packet given - -  Pre-existing out of facility DNR order (yellow form or pink MOST form) - - No No - No    Tobacco Social History   Tobacco Use  Smoking Status Former Smoker   Types: Pipe, Cigars   Quit date: 06/01/2012   Years since quitting: 7.2  Smokeless Tobacco Never Used  Tobacco Comment   patient states that he is not somking the pipe or cigars any more     Counseling given: Not Answered Comment: patient states that he is not somking the pipe or cigars any more   Clinical Intake:  Pre-visit preparation  completed: Yes  Pain : No/denies pain  Diabetes: Yes CBG done?: No Did pt. bring in CBG monitor from home?: No  Interpreter Needed?: No  Information entered by :: Denman George LPN  Past Medical History:  Diagnosis Date   CAD (coronary artery disease), s/p MI - s/p PCI to RCA 2001;  [place] 7/13: 3v CAD, good LVF - >  s/p CABG 7/13 (L-LAD, S-OM1/dCFX, S-dRCA)    Diabetes mellitus type 2, controlled ([place])    Diverticulosis    Early cataracts, bilateral    GERD 09/01/2007   H/O hiatal hernia    HLD (hyperlipidemia), on statin    Hypothyroidism. on Synthroid    MI, old    Neck pain, pinched nerv    Nocturia    PUD (peptic ulcer disease)    Sleep apnea, patient denies    Testosterone deficiency    Past Surgical History:  Procedure Laterality Date   arm surgery  1957   d/t broken   CARDIAC CATHETERIZATION  2005; 06/01/2012   COLONOSCOPY  2005   CORONARY ANGIOPLASTY WITH STENT PLACEMENT  2001   RCA-1 stent   CORONARY ARTERY BYPASS GRAFT  06/09/2012   Procedure: CORONARY ARTERY BYPASS GRAFTING (CABG);  Surgeon: Grace Isaac, MD;  Location: [facility];  Service: Open Heart Surgery;  Laterality: N/A;  CABG x four;  using left internal mammary artery and right leg greater saphenous vein harvested endoscopically   ESOPHAGOGASTRODUODENOSCOPY     PERCUTANEOUS CORONARY STENT INTERVENTION (PCI-S) N/A 06/01/2012   Procedure: PERCUTANEOUS  CORONARY STENT INTERVENTION (PCI-S);  Surgeon: Burnell Blanks, MD;  Location[facility] CATH LAB;  Service: Cardiovascular;  Laterality: N/A;   [place]   at age 27   Family History  Problem Relation Age of Onset   Memory loss Father    Coronary artery disease Father 10       cabg   Colon cancer Neg Hx    Social History   Socioeconomic History   Marital status: Married    Spouse name: Not on file   Number of children: Not on file   Years of education: Not on file   Highest education level:  Not on file  Occupational History   Occupation: Retired  Scientist, product/process development strain: Not on file   Food insecurity    Worry: Not on file    Inability: Not on Lexicographer needs    Medical: Not on file    Non-medical: Not on file  Tobacco Use   Smoking status: Former Smoker    Types: Pipe, Cigars    Quit date: 06/01/2012    Years since quitting: 7.2   Smokeless tobacco: Never Used   Tobacco comment: patient states that he is not somking the pipe or cigars any more  Substance and Sexual Activity   Alcohol use: Yes    Comment: rare   Drug use: No   Sexual activity: Yes  Lifestyle   Physical activity    Days per week: Not on file    Minutes per session: Not on file   Stress: Not on file  Relationships   Social connections    Talks on phone: Not on file    Gets together: Not on file    Attends religious service: Not on file    Active member of club or organization: Not on file    Attends meetings of clubs or organizations: Not on file    Relationship status: Not on file  Other Topics Concern   Not on file  Social History Narrative   Not on file    Outpatient Encounter Medications as of 08/12/2019  Medication Sig   aspirin EC 81 MG tablet Take 81 mg by mouth daily.   clindamycin (CLINDAGEL) 1 % gel APPLY THIN LAYER TOPICALLY TO AFFECTED AREA OF BEARD TWICE DAILY AS NEEDED   glucose blood test strip Check Blood sugars 2-4 times a day as directed.   levothyroxine (SYNTHROID) 100 MCG tablet Take 1 tablet (100 mcg total) by mouth daily.   metFORMIN (GLUCOPHAGE-XR) 500 MG 24 hr tablet Take 1 tablet (500 mg total) by mouth daily with breakfast.   metoprolol tartrate (LOPRESSOR) 25 MG tablet Take 0.5 tablets (12.5 mg total) by mouth 2 (two) times daily.   rosuvastatin (CRESTOR) 40 MG tablet TAKE 1 TABLET BY MOUTH ONCE DAILY OFFICE  VISIT  NEEDED   No facility-administered encounter medications on file as of 08/12/2019.     Activities  of Daily Living In your present state of health, do you have any difficulty performing the following activities: 08/12/2019 07/13/2019  Hearing? N N  Vision? N N  Difficulty concentrating or making decisions? N N  Walking or climbing stairs? N N  Dressing or bathing? N N  Doing errands, shopping? N N  Preparing Food and eating ? N -  Using the Toilet? N -  In the past six months, have you accidently leaked urine? N -  Do you have problems with loss of  bowel control? N -  Managing your Medications? N -  Managing your Finances? N -  Housekeeping or managing your Housekeeping? N -  Some recent data might be hidden    Patient Care Team: Briscoe Deutscher, DO as PCP - General (Family Medicine) Grace Isaac, MD as Consulting Physician (Cardiothoracic Surgery) Minus Breeding, MD as Consulting Physician (Cardiology)   Assessment:   This is a routine wellness examination for Kael.  Exercise Activities and Dietary recommendations Current Exercise Habits: The patient does not participate in regular exercise at present  Goals   None     Fall Risk Fall Risk  08/12/2019 01/12/2019 07/07/2018 08/12/2016 04/25/2015  Falls in the past year? 0 0 No No No  Number falls in past yr: 0 0 - - -  Injury with Fall? 0 0 - - -  Follow up Falls evaluation completed;Education provided - - - -   Is the patient's home free of loose throw rugs in walkways, pet beds, electrical cords, etc?   yes      Grab bars in the bathroom? yes      Handrails on the stairs?   yes      Adequate lighting?   yes    Depression Screen PHQ 2/9 Scores 08/12/2019 01/19/2019 01/12/2019 07/07/2018  PHQ - 2 Score 0 0 0 0  PHQ- 9 Score - 2 3 0    Cognitive Function- no cognitive concerns at this time  Screening declined         Immunization History  Administered Date(s) Administered   Influenza Split 11/01/2011, 09/22/2012   Influenza Whole 09/22/2009, 09/20/2010   Influenza, High Dose Seasonal PF 10/03/2015,  11/11/2016, 10/13/2017, 11/20/2018   Influenza,inj,Quad PF,6+ Mos 09/16/2013, 09/29/2014   Pneumococcal Conjugate-13 02/25/2014   Pneumococcal Polysaccharide-23 09/01/2007, 07/31/2011   Td 09/01/2007    Qualifies for Shingles Vaccine? Discussed and patient will check with pharmacy for coverage.  Patient education handout provided    Screening Tests Health Maintenance  Topic Date Due   TETANUS/TDAP  08/31/2017   FOOT EXAM  02/11/2019   INFLUENZA VACCINE  07/03/2019   URINE MICROALBUMIN  07/08/2019   HEMOGLOBIN A1C  07/13/2019   OPHTHALMOLOGY EXAM  02/11/2020   COLONOSCOPY  05/05/2024   Hepatitis C Screening  Completed   PNA vac Low Risk Adult  Completed   Cancer Screenings: Lung: Low Dose CT Chest recommended if Age 72-80 years, 30 pack-year currently smoking OR have quit w/in 15years. Patient does not qualify. Colorectal: completed 05/2014      Plan:    I have personally reviewed and addressed the Medicare Annual Wellness questionnaire and have noted the following in the patients chart:  A. Medical and social history B. Use of alcohol, tobacco or illicit drugs  C. Current medications and supplements D. Functional ability and status E.  Nutritional status F.  Physical activity G. Advance directives H. List of other physicians I.  Hospitalizations, surgeries, and ER visits in previous 12 months J.  [place] such as hearing and vision if needed, cognitive and depression L. Referrals, records requested, and appointments-   In addition, I have reviewed and discussed with patient certain preventive protocols, quality metrics, and best practice recommendations. A written personalized care plan for preventive services as well as general preventive health recommendations were provided to patient.   Signed,  Denman George, LPN  Nurse Health Advisor   Nurse Notes: patient having problems with dreams that he would like to discuss with provider

## 2019-08-13 ENCOUNTER — Other Ambulatory Visit (INDEPENDENT_AMBULATORY_CARE_PROVIDER_SITE_OTHER): Payer: Medicare HMO

## 2019-08-13 ENCOUNTER — Encounter: Payer: Self-pay | Admitting: Physician Assistant

## 2019-08-13 ENCOUNTER — Other Ambulatory Visit: Payer: Self-pay | Admitting: Family Medicine

## 2019-08-13 ENCOUNTER — Encounter: Payer: Self-pay | Admitting: Family Medicine

## 2019-08-13 ENCOUNTER — Ambulatory Visit (INDEPENDENT_AMBULATORY_CARE_PROVIDER_SITE_OTHER): Payer: Medicare HMO | Admitting: Physician Assistant

## 2019-08-13 DIAGNOSIS — G4752 REM sleep behavior disorder: Secondary | ICD-10-CM | POA: Diagnosis not present

## 2019-08-13 LAB — COMPREHENSIVE METABOLIC PANEL
ALT: 16 U/L (ref 0–53)
AST: 16 U/L (ref 0–37)
Albumin: 4.2 g/dL (ref 3.5–5.2)
Alkaline Phosphatase: 58 U/L (ref 39–117)
BUN: 13 mg/dL (ref 6–23)
CO2: 26 mEq/L (ref 19–32)
Calcium: 9.4 mg/dL (ref 8.4–10.5)
Chloride: 103 mEq/L (ref 96–112)
Creatinine, Ser: 0.96 mg/dL (ref 0.40–1.50)
GFR: 76.37 mL/min (ref >60.00)
Glucose, Bld: 252 mg/dL — ABNORMAL HIGH (ref 70–99)
Potassium: 4.1 mEq/L (ref 3.5–5.1)
Sodium: 138 mEq/L (ref 135–145)
Total Bilirubin: 0.8 mg/dL (ref 0.2–1.2)
Total Protein: 6.6 g/dL (ref 6.0–8.3)

## 2019-08-13 LAB — URINALYSIS, ROUTINE W REFLEX MICROSCOPIC
Bilirubin Urine: NEGATIVE
Ketones, ur: NEGATIVE
Leukocytes,Ua: NEGATIVE
Nitrite: NEGATIVE
Specific Gravity, Urine: 1.025 (ref 1.000–1.030)
Total Protein, Urine: NEGATIVE
Urine Glucose: 500 — AB
Urobilinogen, UA: 0.2 (ref 0.0–1.0)
pH: 5.5 (ref 5.0–8.0)

## 2019-08-13 LAB — CBC WITH DIFFERENTIAL/PLATELET
Basophils Absolute: 0 10*3/uL (ref 0.0–0.1)
Basophils Relative: 0.4 % (ref 0.0–3.0)
Eosinophils Absolute: 0.1 10*3/uL (ref 0.0–0.7)
Eosinophils Relative: 0.9 % (ref 0.0–5.0)
HCT: 43.8 % (ref 39.0–52.0)
Hemoglobin: 15.1 g/dL (ref 13.0–17.0)
Lymphocytes Relative: 20.3 % (ref 12.0–46.0)
Lymphs Abs: 1.3 10*3/uL (ref 0.7–4.0)
MCHC: 34.6 g/dL (ref 30.0–36.0)
MCV: 91.7 fl (ref 78.0–100.0)
Monocytes Absolute: 0.4 10*3/uL (ref 0.1–1.0)
Monocytes Relative: 6.9 % (ref 3.0–12.0)
Neutro Abs: 4.6 10*3/uL (ref 1.4–7.7)
Neutrophils Relative %: 71.5 % (ref 43.0–77.0)
Platelets: 134 10*3/uL — ABNORMAL LOW (ref 150.0–400.0)
RBC: 4.77 Mil/uL (ref 4.22–5.81)
RDW: 13.4 % (ref 11.5–15.5)
WBC: 6.4 10*3/uL (ref 4.0–10.5)

## 2019-08-13 LAB — HEMOGLOBIN A1C: Hgb A1c MFr Bld: 6.7 % — ABNORMAL HIGH (ref 4.6–6.5)

## 2019-08-13 NOTE — Progress Notes (Signed)
TELEPHONE ENCOUNTER   Patient verbally agreed to telephone visit and is aware that copayment and coinsurance may apply. Patient was treated using telemedicine according to accepted telemedicine protocols.  Location of the patient: home Location of provider: [place] of all persons participating in the telemedicine service and role in the encounter:   I acted as a Education administrator for [employer], PA-C Anselmo Pickler, LPN  Subjective:   Chief Complaint  Patient presents with  . nightmares     HPI   Nightmares Patient reports that he has had issues with nightmares for the past 6 years.  At one point about 6 years ago he noticed that he had a dream where he was choking someone, but when he woke up he realized that he was choking his wife, as he was keeping his arm around her at night.  He then started sleeping with his back towards her, and this is helped.  There are times when he acts out his dreams, and he notices that over the past few months the acting out has increased.  These nightmares are typically involved with either defending himself in an attack or trying to catch a bad person.  He denies any specific trauma in his past, was in the [employer] for 2 years, but states that it was relatively benign experience.  Denies any anxiety, his wife currently is fighting cancer so may have some situational depression towards that.  He has never had a sleep study.  He does snore at night.  Denies any history of head trauma.  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  He is extremely compliant with his medications and takes them regularly, especially his thyroid medication every morning on an empty stomach.  He does state that his blood sugars may have been running a little bit higher these days, they are around 130.   He denies: Slurred speech, confusion, falls, weakness on one side of the body  Patient Active Problem List   Diagnosis Date Noted  . Diverticulosis  07/13/2019  . Former smoker 07/13/2019  . Essential hypertension, Rx Metoprolol 10/05/2018  . Type 2 diabetes mellitus with complication, without long-term current use of insulin ([place]) 08/12/2016  . Hypothyroidism, Rx Levothyroxine 05/29/2009  . 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 Testosterone deficiency 09/01/2007  . Hyperlipidemia associated with type 2 diabetes mellitus ([place]), Rx Crestor 09/01/2007  . 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 GERD 09/01/2007  . Peptic ulcer 09/01/2007   Social History   Tobacco Use  . Smoking status: Former Smoker    Types: Pipe, Cigars    Quit date: 06/01/2012    Years since quitting: 7.2  . Smokeless tobacco: Never Used  . Tobacco comment: patient states that he is not somking the pipe or cigars any more  Substance Use Topics  . Alcohol use: Yes    Comment: rare    Current Outpatient Medications:  .  aspirin EC 81 MG tablet, Take 81 mg by mouth daily., Disp: , Rfl:  .  clindamycin (CLINDAGEL) 1 % gel, APPLY THIN LAYER TOPICALLY TO AFFECTED AREA OF BEARD TWICE DAILY AS NEEDED, Disp: , Rfl: 99 .  glucose blood test strip, Check Blood sugars 2-4 times a day as directed., Disp: 100 each, Rfl: 3 .  levothyroxine (SYNTHROID) 100 MCG tablet, Take 1  tablet (100 mcg total) by mouth daily., Disp: 90 tablet, Rfl: 3 .  metFORMIN (GLUCOPHAGE-XR) 500 MG 24 hr tablet, Take 1 tablet (500 mg total) by mouth daily with breakfast., Disp: 90 tablet, Rfl: 3 .  metoprolol tartrate (LOPRESSOR) 25 MG tablet, Take 0.5 tablets (12.5 mg total) by mouth 2 (two) times daily., Disp: 90 tablet, Rfl: 1 .  rosuvastatin (CRESTOR) 40 MG tablet, TAKE 1 TABLET BY MOUTH ONCE DAILY OFFICE  VISIT  NEEDED, Disp: 90 tablet, Rfl: 0 No Known Allergies  Assessment & Plan:   1. REM sleep behavior disorder    Difficult to assess via telephone.  I did recommend that he  come in so we can update labs, specifically hemoglobin A1c as this is not been checked in a while.  We will also check a urine.  Referral to neurology for further evaluation and treatment.  I did recommend that he put all of his knives, guns (if he has any), or anything else that could be used as a weapon, locked away in the interim.  Patient verbalized understanding to plan.   Orders Placed This Encounter  Procedures  . CBC with Differential/Platelet  . Comprehensive metabolic panel  . Hemoglobin A1c  . Urinalysis, Routine w reflex microscopic   No orders of the defined types were placed in this encounter.   Inda Coke, [place] 08/13/2019  Time spent with the patient: 15 minutes, spent in obtaining information about his symptoms, reviewing his previous labs, evaluations, and treatments, counseling him about his condition (please see the discussed topics above), and developing a plan to further investigate it; he had a number of questions which I addressed.

## 2019-08-13 NOTE — Telephone Encounter (Signed)
See below

## 2019-08-13 NOTE — Progress Notes (Signed)
I have reviewed documentation for AWV and Advance Care planning provided by [facility], I agree with documentation, I was immediately available for any questions. Savaughn Karwowski, DO   

## 2019-08-24 ENCOUNTER — Ambulatory Visit: Payer: Medicare HMO | Admitting: Neurology

## 2019-08-24 ENCOUNTER — Other Ambulatory Visit: Payer: Self-pay

## 2019-08-24 ENCOUNTER — Encounter: Payer: Self-pay | Admitting: Neurology

## 2019-08-24 VITALS — BP 124/76 | HR 66 | Temp 97.5°F | Ht 68.0 in | Wt 202.0 lb

## 2019-08-24 DIAGNOSIS — Z87891 Personal history of nicotine dependence: Secondary | ICD-10-CM

## 2019-08-24 DIAGNOSIS — E039 Hypothyroidism, unspecified: Secondary | ICD-10-CM | POA: Diagnosis not present

## 2019-08-24 DIAGNOSIS — G4754 Parasomnia in conditions classified elsewhere: Secondary | ICD-10-CM | POA: Diagnosis not present

## 2019-08-24 DIAGNOSIS — F518 Other sleep disorders not due to a substance or known physiological condition: Secondary | ICD-10-CM | POA: Diagnosis not present

## 2019-08-24 DIAGNOSIS — M5412 Radiculopathy, cervical region: Secondary | ICD-10-CM

## 2019-08-24 DIAGNOSIS — E118 Type 2 diabetes mellitus with unspecified complications: Secondary | ICD-10-CM | POA: Diagnosis not present

## 2019-08-24 DIAGNOSIS — R69 Illness, unspecified: Secondary | ICD-10-CM | POA: Diagnosis not present

## 2019-08-24 MED ORDER — CLONAZEPAM 0.5 MG PO TABS: 0.2500 mg | ORAL_TABLET | Freq: Two times a day (BID) | ORAL | 1 refills | Status: DC | PRN

## 2019-08-24 NOTE — Progress Notes (Signed)
SLEEP MEDICINE CLINIC    Provider:  Larey Seat, MD  Primary Care Physician:  Adrian Gamble, [address] [place] [address]     Referring Provider: Briscoe Deutscher, [address],  [place] [address]          Chief Complaint according to patient   Patient presents with:    . New Patient (Initial Visit)     Patient reports repeated "nightmares' that he enacts. He has dreamt of having to chase someone or defend himself, has kicked and boxed his wife , and has now left the bed in an enactment.       HISTORY OF PRESENT ILLNESS:  Adrian Gamble is a 73 y.o. year old Caucasian male patient seen here on 08/24/2019 in a REM behaviour related sleep consultation.  Chief concern according to patient :" I am getting work with acting out dreams  - started 6 years ago ". He may have several nights with dream enactment in a week and then none for month. He also sleeps no longer in the same bedroom as his wife.    I have the pleasure of seeing Adrian Gamble today, a right -handed Caucasian male with a possible sleep disorder. He  has a past medical history of CAD (coronary artery disease), s/p MI - s/p PCI to RCA 2001;  [place] 7/13: 3v CAD, good LVF - >  s/p CABG 7/13 (L-LAD, S-OM1/dCFX, S-dRCA), Diabetes mellitus type 2, controlled ([facility]), Diverticulosis, Early cataracts, bilateral, GERD (09/01/2007), H/O hiatal hernia, HLD (hyperlipidemia), on statin, Hypothyroidism on Synthroid, Myocardial Infarction, stent in 2001 , Bypass in 2013 Dr. Madolyn Frieze , Neck pain, pinched nerv, Nocturia, PUD (peptic ulcer disease), Sleep apnea, patient denies, and Testosterone deficiency. he has a monotone voice, and low volume voice, a slightly masked face. He is talkative, but seems distracted, he had one of the 3 questionnaires filled out, and did not add it up. He does give very lengthy answers, tangential.  He reports nocturia.   He is worried because his outbursts have been more violent, in his  dream he tried to break a baton- what he tried to break was his wife's arm.   Last month he fell over a piece of furniture walking to the next bed, and 2 month ago similar. He has reportedly most dream activity after 1 AM.   He reports his wife has been diagnosed stage 4 lung cancer and brain metastasis, 16 in the brain, one removed surgically.   Family medical /sleep history: No other family member on CPAP with OSA, insomnia, sleep walkers- .    Social history:  Patient is retired from [employer] as a Teacher, English as a foreign language- and lives in a household with his spouse. Family status is married, with adult children, and grandchildren.  The patient currently is a main caretaker. Pets not present.  Tobacco use_ cigare/ pipe quit 2013, cigarettes quit 1990.   ETOH use ; has been drinking less and less since he got married, none in 3-4 years. Caffeine intake in form of Coffee( 3 cups ) Soda( rare ) Tea ( none ) or energy drinks. Regular exercise in form of golf.  Hobbies  Golf   Sleep habits are as follows: The patient's dinner time is between 5.30-6  PM. The patient goes to bed at 10.30 PM and is asleep promptly- he continues to sleep for 2-5 hours,can be variable- he wakes for 1-3 bathroom breaks, the first time at 2 AM.  The preferred sleep position is lateral , with the support of 2 pillows. Dreams are reportedly frequent/vivid.Marland Kitchen  8.30 AM is the usual rise time.  The patient wakes up spontaneously 7-8 , if he needs to get up earlier he needs with an alarm.  He reports often not feeling refreshed or restored in AM, with symptoms such as dry mouth , but not  morning headaches- some residual fatigue.  Naps are taken infrequently, lasting from 20 to 45 minutes and are as refreshing as nocturnal sleep.    Review of Systems: Out of a complete 14 system review, the patient complains of only the following symptoms, and all other reviewed systems are negative.:  Fatigue, sleepiness , snoring, fragmented  sleep, Insomnia- ENACTMENT OF DREAMS    How likely are you to doze in the following situations: 0 = not likely, 1 = slight chance, 2 = moderate chance, 3 = high chance   Sitting and Reading? Watching Television? Sitting inactive in a public place (theater or meeting)? As a passenger in a car for an hour without a break? Lying down in the afternoon when circumstances permit? Sitting and talking to someone? Sitting quietly after lunch without alcohol? In a car, while stopped for a few minutes in traffic?   Total = 13/ 24 points   FSS endorsed at NA/ 63 points.  GDS was N/.A   Social History   Socioeconomic History  . Marital status: Married    Spouse name: Not on file  . Number of children: Not on file  . Years of education: Not on file  . Highest education level: Not on file  Occupational History  . Occupation: Retired  Scientific laboratory technician  . Financial resource strain: Not on file  . Food insecurity    Worry: Not on file    Inability: Not on file  . Transportation needs    Medical: Not on file    Non-medical: Not on file  Tobacco Use  . Smoking status: Former Smoker    Types: Pipe, Cigars    Quit date: 06/01/2012    Years since quitting: 7.2  . Smokeless tobacco: Never Used  . Tobacco comment: patient states that he is not somking the pipe or cigars any more  Substance and Sexual Activity  . Alcohol use: Yes    Comment: rare  . Drug use: No  . Sexual activity: Yes  Lifestyle  . Physical activity    Days per week: Not on file    Minutes per session: Not on file  . Stress: Not on file  Relationships  . Social Herbalist on phone: Not on file    Gets together: Not on file    Attends religious service: Not on file    Active member of club or organization: Not on file    Attends meetings of clubs or organizations: Not on file    Relationship status: Not on file  Other Topics Concern  . Not on file  Social History Narrative  . Not on file    Family History   Problem Relation Age of Onset  . Memory loss Father   . Coronary artery disease Father 68       cabg  . Colon cancer Neg Hx     Past Medical History:  Diagnosis Date  . CAD (coronary artery disease), s/p MI - s/p PCI to RCA 2001;  [facility] 7/13: 3v CAD, good LVF - >  s/p CABG 7/13 (L-LAD, S-OM1/dCFX, S-dRCA)   .  Diabetes mellitus type 2, controlled ([facility])   . Diverticulosis   . Early cataracts, bilateral   . GERD 09/01/2007  . H/O hiatal hernia   . HLD (hyperlipidemia), on statin   . Hypothyroidism. on Synthroid   . MI, old   . Neck pain, pinched nerv   . Nocturia   . PUD (peptic ulcer disease)   . Sleep apnea, patient denies   . Testosterone deficiency     Past Surgical History:  Procedure Laterality Date  . arm surgery  1957   d/t broken  . CARDIAC CATHETERIZATION  2005; 06/01/2012  . COLONOSCOPY  2005  . CORONARY ANGIOPLASTY WITH STENT PLACEMENT  2001   RCA-1 stent  . CORONARY ARTERY BYPASS GRAFT  06/09/2012   Procedure: CORONARY ARTERY BYPASS GRAFTING (CABG);  Surgeon: Grace Isaac, MD;  Location: [facility];  Service: Open Heart Surgery;  Laterality: N/A;  CABG x four;  using left internal mammary artery and right leg greater saphenous vein harvested endoscopically  . ESOPHAGOGASTRODUODENOSCOPY    . PERCUTANEOUS CORONARY STENT INTERVENTION (PCI-S) N/A 06/01/2012   Procedure: PERCUTANEOUS CORONARY STENT INTERVENTION (PCI-S);  Surgeon: Burnell Blanks, MD;  Location: [facility] CATH LAB;  Service: Cardiovascular;  Laterality: N/A;  . TONSILLECTOMY AND ADENOIDECTOMY  1955   at age 61     Current Outpatient Medications on File Prior to Visit  Medication Sig Dispense Refill  . aspirin EC 81 MG tablet Take 81 mg by mouth daily.    . clindamycin (CLINDAGEL) 1 % gel APPLY THIN LAYER TOPICALLY TO AFFECTED AREA OF BEARD TWICE DAILY AS NEEDED  99  . glucose blood test strip Check Blood sugars 2-4 times a day as directed. 100 each 3  . levothyroxine (SYNTHROID) 100 MCG tablet Take 1  tablet (100 mcg total) by mouth daily. 90 tablet 3  . metFORMIN (GLUCOPHAGE-XR) 500 MG 24 hr tablet Take 1 tablet (500 mg total) by mouth daily with breakfast. 90 tablet 3  . metoprolol tartrate (LOPRESSOR) 25 MG tablet Take 0.5 tablets (12.5 mg total) by mouth 2 (two) times daily. 90 tablet 1  . rosuvastatin (CRESTOR) 40 MG tablet TAKE 1 TABLET BY MOUTH ONCE DAILY OFFICE  VISIT  NEEDED 90 tablet 0   No current facility-administered medications on file prior to visit.     No Known Allergies  Physical exam:  Today's Vitals   08/24/19 1301  BP: 124/76  Pulse: 66  Temp: (!) 97.5 F (36.4 C)  Weight: 202 lb (91.6 kg)  Height: 5\' 8"  (1.727 m)   Body mass index is 30.71 kg/m.   Wt Readings from Last 3 Encounters:  08/24/19 202 lb (91.6 kg)  08/12/19 196 lb (88.9 kg)  07/13/19 198 lb (89.8 kg)     Ht Readings from Last 3 Encounters:  08/24/19 5\' 8"  (1.727 m)  08/12/19 5\' 8"  (1.727 m)  07/13/19 5\' 8"  (1.727 m)      General: The patient is awake, alert and appears not in acute distress. The patient is well groomed. Head: Normocephalic, atraumatic. Neck is supple. Mallampati 4 , narrow and retrognathia, too/ ,  neck circumference:16 inches . Nasal airflow on the left not  patent.  Retrognathia is  seen.   Cardiovascular:  Regular rate and cardiac rhythm by pulse,  without distended neck veins. Respiratory: Lungs are clear to auscultation.  Skin:  Without evidence of ankle edema, or rash. Trunk: The patient's posture is erect.   Neurologic exam : The patient is awake and  alert, oriented to place and time.   Memory subjective described as intact.  Attention span & concentration ability appears normal.  Speech is fluent,  without  dysarthria, dysphonia or aphasia.  Mood and affect are appropriate.   Cranial nerves: no loss of smell or taste reported  Pupils are equal and briskly reactive to light. Funduscopic exam deferred. Had cataract surgery.    Extraocular movements in  vertical and horizontal planes were intact and without nystagmus. No Diplopia. Visual fields by finger perimetry are intact.Hearing was intact to soft voice and finger rubbing.  Facial sensation intact to fine touch.  Facial motor strength is symmetric and tongue and uvula move midline.  Neck ROM : rotation, tilt and flexion extension were normal for age and shoulder shrug was symmetrical.    Motor exam:  Symmetric bulk, tone and ROM.   Normal tone without cog wheeling, symmetric grip strength .   Sensory:  Fine touch, pinprick and vibration were tested  and  normal.  Proprioception tested in the upper extremities was normal.   Coordination: Rapid alternating movements in the fingers/hands were of normal speed.  The Finger-to-nose maneuver was intact without evidence of ataxia, dysmetria or tremor.   Gait and station: Patient could rise unassisted from a seated position, walked without assistive device.  Stance is of normal width/ base .  Toe and heel walk were deferred.  Deep tendon reflexes: in the  upper and lower extremities are symmetric at 2 plus.  Babinski response was deferred.        After spending a total time of 45 minutes face to face and additional time for physical and neurologic examination, review of laboratory studies,  personal review of imaging studies, reports and results of other testing and review of referral information / records as far as provided in visit, I have established the following assessments:  1)  The patient could have OSA , there is a narrow upper airway and BMI of 31, deviated septum.,and he is unsure if he snores.   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  He could be woken easily- Now he is leaving the bed, has fallen nearly out of bed.  Still a progression of REM BD. He is hard to wake up now.   REM BD is usually associated with  Parkinson's disease or Lewy body dementia-    My Plan is to proceed with:  1) expanded EEG montage Sleep study for REM BD/ seizure montage.   2) screening for apnea in the same setting.  3) I will recommend 5 mg melatonin, and I will provide a 0.25 mg Klonopin for nights. He should not take these medications when here in lab.   I would like to thank Adrian Deutscher, DO and Adrian Deutscher, [address] [place],  [place] [address] for allowing me to meet with and to take care of this pleasant patient.   In short, Rocke Casselman is presenting with  Parasomnia activity a symptom that can be attributed to either neurodegenerative disorders or psychological stress .   I plan to follow up either personally or through our NP within 2-3 month.   CC: I will share my notes with PCP.   Electronically signed by: Larey Seat, MD 08/24/2019 1:06 PM  [facility] Board certified by The [employer] of Sleep Medicine and  Diplomate of the [employer] of Sleep Medicine. Board certified In Neurology through the [place], Fellow of the [employer] of Neurology. Medical Director of [facility].

## 2019-08-24 NOTE — Patient Instructions (Addendum)
Sleep Study  Quenton Fetter scheduled for a sleep study -You will leave next morning between 5:00 a.m - 6.00 AM  The sleep study consists of a recording of your brain waves (EEG). Breathing, heart rate and rhythm (ECG), oxygen level, eye movement, and leg movement.  The technician will glue or or paste several electrodes to your scalp, face, chest and legs.  You will have belts around your chest and abdomen to record breathing and a finger clasp to check blood oxygen levels.  A tube at your mouth and nose will detect airflow.  There are no needle sticks or painful procedures of any sort.  You will have your own room, and we will make every effort to attend to your comfort and privacy.  Please prepare for your study by the following steps:   Please avoid coffee, tea, soda, chocolate and other caffeine foods or beverages after 12:00 noon on the day of your sleep study.   You must arrive with clean (no oils), conditioners or make up, and please make sure that you wash your hair to ensure that your hair and scalp are clean, dry and free of any hair extensions on the day of your study.  This will help to get a good reading of study.  Please try not to nap on the day of your study.  Please bring a list of all your medications.  Bring any medications that you might need during the time you are within the laboratory, including insulin, sleeping pills, pain medication and anxiety medications.  Bring snacks, water or juice  Please bring clothes to sleep in and your normal overnight bag.  Please leave valuable at home, as we will not be responsible for any lost items.  If you have any further questions, please feel free to call our office. Thank you  *Please call our office 48 in advance to cancel or reschedule to avoid a no show fee.    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 He indicated that there were weeks on month between enactments of vivid dreams at night.   I reminded Mr Borgo that the prescription of the Clonazepam was meant as a REM sleep supressant for a patient with abnormal dreams, dream enactment.  If he enacts dreams in spite of 5 mg  melatonin, he may use the medication at night. I prefer him not ot take either the night of his sleep test !  I like for the patient to use it when travelling, or when sleeping in a strange environment, to not have spells of threatening behavior and reducing the risk of self injury.

## 2019-08-25 DIAGNOSIS — R69 Illness, unspecified: Secondary | ICD-10-CM | POA: Diagnosis not present

## 2019-08-26 ENCOUNTER — Telehealth: Payer: Self-pay | Admitting: Physical Therapy

## 2019-08-26 ENCOUNTER — Telehealth: Payer: Self-pay | Admitting: Neurology

## 2019-08-26 DIAGNOSIS — R69 Illness, unspecified: Secondary | ICD-10-CM | POA: Diagnosis not present

## 2019-08-26 MED ORDER — CLONAZEPAM 0.5 MG PO TABS: ORAL_TABLET | ORAL | 1 refills | Status: DC

## 2019-08-26 NOTE — Telephone Encounter (Signed)
The patient was first asked to try melatonin , OTC preparation at  5 mg or less, taken at bedtime, and to keep a sleep journal .  I reminded Adrian Gamble that the prescription of the Clonazepam was meant as a REM sleep supressant for a patient with abnormal dreams, dream enactment.  If he enacts dreams in spite of 5 mg  melatonin, he may use the medication at night. I prefer him not ot take either the night of his sleep test ! .  I like for the patient to use it when travelling, or when sleeping in a strange environment, to not have spells of threatening behavior and reducing the risk of self injury.  Adrian and Mrs Amodio were on the phone- signalizing understanding.

## 2019-08-26 NOTE — Telephone Encounter (Signed)
Pt is asking for a call from RN to discuss the clonazePAM (KLONOPIN) 0.5 MG tablet He is unsure as to how to take it.  Please call

## 2019-08-26 NOTE — Telephone Encounter (Signed)
I spoke with Adrian Gamble and informed him of how to take medication, per last note and lab result notes.  Patient informed to continue to take metformin XR 500 mg in the am and 1 in the pm, per [facility] [place]/lab result note.

## 2019-08-26 NOTE — Telephone Encounter (Signed)
Copied from [place] [id number]. Topic: General - Inquiry >> Aug 26, 2019 11:52 AM Mathis Bud wrote: Reason for CRM: Patient has question about his medication metformin on when to take and how much. Call back [phone]

## 2019-08-26 NOTE — Addendum Note (Signed)
Addended by: Larey Seat on: 08/26/2019 05:08 PM   Modules accepted: Orders

## 2019-08-30 ENCOUNTER — Telehealth: Payer: Self-pay | Admitting: Physical Therapy

## 2019-08-30 NOTE — Telephone Encounter (Signed)
Spoke to pt had questions about what other physicians were taking new patients he heard Dr Juleen China is leaving and his wife wants a male and he follows his wife. Told him Dr. Jonni Sanger, Dr. Rogers Blocker and [facility] are all taking patients. Pt verbalized understanding and said okay he will discuss with his wife and call back and speak with the schedulers. Told him okay,.

## 2019-08-30 NOTE — Telephone Encounter (Signed)
Not sure if this should go [facility] or [employer] as both have seen pt so routing to both. Cloyde Reams Copied from [place] [id number]. Topic: General - Call Back - No Documentation >> Aug 30, 2019 12:04 PM Adrian Gamble wrote: Reason for CRM: Requesting call back from [facility] concerning work that he is having  Best contact: [phone] >> Aug 30, 2019 12:07 PM Adrian Gamble wrote: Dr. 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

## 2019-09-06 DIAGNOSIS — R69 Illness, unspecified: Secondary | ICD-10-CM | POA: Diagnosis not present

## 2019-09-16 ENCOUNTER — Ambulatory Visit (INDEPENDENT_AMBULATORY_CARE_PROVIDER_SITE_OTHER): Payer: Medicare HMO | Admitting: Neurology

## 2019-09-16 ENCOUNTER — Encounter: Payer: Self-pay | Admitting: Family Medicine

## 2019-09-16 ENCOUNTER — Ambulatory Visit (INDEPENDENT_AMBULATORY_CARE_PROVIDER_SITE_OTHER): Payer: Medicare HMO | Admitting: Family Medicine

## 2019-09-16 ENCOUNTER — Other Ambulatory Visit: Payer: Self-pay

## 2019-09-16 VITALS — BP 124/74 | HR 67 | Temp 98.0°F | Resp 16 | Ht 68.0 in | Wt 199.2 lb

## 2019-09-16 DIAGNOSIS — E1169 Type 2 diabetes mellitus with other specified complication: Secondary | ICD-10-CM

## 2019-09-16 DIAGNOSIS — E785 Hyperlipidemia, unspecified: Secondary | ICD-10-CM

## 2019-09-16 DIAGNOSIS — E039 Hypothyroidism, unspecified: Secondary | ICD-10-CM | POA: Diagnosis not present

## 2019-09-16 DIAGNOSIS — E118 Type 2 diabetes mellitus with unspecified complications: Secondary | ICD-10-CM

## 2019-09-16 DIAGNOSIS — G4752 REM sleep behavior disorder: Secondary | ICD-10-CM | POA: Diagnosis not present

## 2019-09-16 DIAGNOSIS — Z23 Encounter for immunization: Secondary | ICD-10-CM

## 2019-09-16 DIAGNOSIS — I2510 Atherosclerotic heart disease of native coronary artery without angina pectoris: Secondary | ICD-10-CM

## 2019-09-16 DIAGNOSIS — F518 Other sleep disorders not due to a substance or known physiological condition: Secondary | ICD-10-CM

## 2019-09-16 DIAGNOSIS — G4754 Parasomnia in conditions classified elsewhere: Secondary | ICD-10-CM

## 2019-09-16 DIAGNOSIS — I10 Essential (primary) hypertension: Secondary | ICD-10-CM

## 2019-09-16 DIAGNOSIS — L570 Actinic keratosis: Secondary | ICD-10-CM | POA: Diagnosis not present

## 2019-09-16 DIAGNOSIS — M5412 Radiculopathy, cervical region: Secondary | ICD-10-CM

## 2019-09-16 DIAGNOSIS — Z87891 Personal history of nicotine dependence: Secondary | ICD-10-CM

## 2019-09-16 HISTORY — DX: REM sleep behavior disorder: G47.52

## 2019-09-16 LAB — MICROALBUMIN / CREATININE URINE RATIO
Creatinine,U: 113.3 mg/dL
Microalb Creat Ratio: 18.8 mg/g (ref 0.0–30.0)
Microalb, Ur: 21.4 mg/dL — ABNORMAL HIGH (ref 0.0–1.9)

## 2019-09-16 NOTE — Progress Notes (Addendum)
Subjective  CC:  Chief Complaint  Patient presents with  . Transitions Of Care  . Skin Irritation    Top of head, been present for 6 months.. Reports itching and white area    HPI: Adrian Gamble is a 72 y.o. male who presents to [facility] at [place] today to establish care with me as a new patient.   He has the following concerns or needs:  Very pleasant 73 yo with h/o CAD, DM, HLD, currently being evaluated for REM-BD and possible LBD or parkinson's presents to establish. I reviewed his records in detail and we discussed and updated his PL accordingly. Overall he is doing well. His wife has metastatic lung cancer to brain. They are managing well. Married x > 50 years, 2 grown children and 6 grandchildren. Originally from [facility].  Diabetes follow up: His diabetic control is reported as Improved. Working diet and exercise.  He denies exertional CP or SOB or symptomatic hypoglycemia. He denies foot sores or paresthesias. Eye exam is up todate. Due flu vaccine. Not on ace or arb.  HLD is well controlled.  On statin  C/o flaking red spots on scalp. Has dermatologist and has had cryotherapy for lesions before. No h/o skin cancers.   Hypothyroidism which is currently stable.  Recently dose has been decreased.  He feels well.  Remote history of CAD.  No chest pain.   Immunization History  Administered Date(s) Administered  . Fluad Quad(high Dose 65+) 09/16/2019  . Influenza Split 11/01/2011, 09/22/2012  . Influenza Whole 09/22/2009, 09/20/2010  . Influenza, High Dose Seasonal PF 10/03/2015, 11/11/2016, 10/13/2017, 11/20/2018  . Influenza,inj,Quad PF,6+ Mos 09/16/2013, 09/29/2014  . Pneumococcal Conjugate-13 02/25/2014  . Pneumococcal Polysaccharide-23 09/01/2007, 07/31/2011  . Td 09/01/2007    Diabetes Related Lab Review: Lab Results  Component Value Date   HGBA1C 6.7 (H) 08/13/2019   HGBA1C 6.4 (A) 01/12/2019   HGBA1C 6.2 (A) 07/07/2018    Lab Results   Component Value Date   MICROALBUR 21.4 (H) 09/16/2019   Lab Results  Component Value Date   CREATININE 0.96 08/13/2019   BUN 13 08/13/2019   NA 138 08/13/2019   K 4.1 08/13/2019   CL 103 08/13/2019   CO2 26 08/13/2019   Lab Results  Component Value Date   CHOL 121 01/12/2019   CHOL 143 07/07/2018   CHOL 117 07/07/2017   Lab Results  Component Value Date   HDL 37.00 (L) 01/12/2019   HDL 36.70 (L) 07/07/2018   HDL 37.60 (L) 07/07/2017   Lab Results  Component Value Date   LDLCALC 68 01/12/2019   LDLCALC 71 07/07/2018   LDLCALC 63 07/07/2017   Lab Results  Component Value Date   TRIG 80.0 01/12/2019   TRIG 177.0 (H) 07/07/2018   TRIG 81.0 07/07/2017   Lab Results  Component Value Date   CHOLHDL 3 01/12/2019   CHOLHDL 4 07/07/2018   CHOLHDL 3 07/07/2017   No results found for: LDLDIRECT The ASCVD Risk score Mikey Bussing DC Jr., et al., 2013) failed to calculate for the following reasons:   The patient has a prior MI or stroke diagnosis  BP Readings from Last 3 Encounters:  09/16/19 124/74  08/24/19 124/76  01/19/19 106/62   Wt Readings from Last 3 Encounters:  09/16/19 199 lb 3.2 oz (90.4 kg)  08/24/19 202 lb (91.6 kg)  08/12/19 196 lb (88.9 kg)    Health Maintenance  Topic Date Due  . TETANUS/TDAP  08/31/2017  .  FOOT EXAM  02/11/2019  . INFLUENZA VACCINE  07/03/2019  . URINE MICROALBUMIN  07/08/2019  . HEMOGLOBIN A1C  02/10/2020  . OPHTHALMOLOGY EXAM  02/11/2020  . COLONOSCOPY  05/05/2024  . Hepatitis C Screening  Completed  . PNA vac Low Risk Adult  Completed     Assessment  1. Type 2 diabetes mellitus with complication, without long-term current use of insulin ([place])   2. Hyperlipidemia associated with type 2 diabetes mellitus ([place]), Rx Crestor   3. Acquired hypothyroidism   4. Atherosclerosis of native coronary artery of native heart without angina pectoris   5. Essential hypertension, Rx Metoprolol   6. Sleep behavior disorder, REM   7. Actinic  keratoses   8. Need for immunization against influenza      Plan   Type 2 diabetes is currently well controlled.  Continue current medications.  Recheck 3 months.  Encourage diabetic diet, exercise, weight loss.  Continue good foot care.  Eye exam is up-to-date.  Check urine microalbuminuria today.  If positive start ACE or ARB.  Hyperlipidemia and hypothyroidism both are well controlled currently.  No changes in medications  Hypertension on low-dose beta-blocker well-controlled.  Again consider ACE inhibitor given he has diabetes  Neurology to continue work-up for sleep behavior disorder and possible neurodegenerative etiology  AK's: Education and counseling given.  Routine postprocedure care instructions given.  Cryotherapy tolerated well  Updated flu shot today.  Follow up:  Return in about 8 weeks (around 11/11/2019) for follow up Diabetes. Orders Placed This Encounter  Procedures  . Flu Vaccine QUAD High Dose(Fluad)  . Microalbumin / creatinine urine ratio   No orders of the defined types were placed in this encounter.    Depression screen [facility] 2/9 08/12/2019 01/19/2019 01/12/2019 07/07/2018 08/12/2016  Decreased Interest 0 0 0 0 0  Down, Depressed, Hopeless 0 0 0 0 0  PHQ - 2 Score 0 0 0 0 0  Altered sleeping - 1 1 0 -  Tired, decreased energy - 1 1 0 -  Change in appetite - 0 0 0 -  Feeling bad or failure about yourself  - 0 0 0 -  Trouble concentrating - 0 1 0 -  Moving slowly or fidgety/restless - 0 0 0 -  Suicidal thoughts - 0 0 0 -  PHQ-9 Score - 2 3 0 -  Difficult doing work/chores - Not difficult at all Not difficult at all Not difficult at all -    We updated and reviewed the patient's past history in detail and it is documented below.  Patient Active Problem List   Diagnosis Date Noted  . Sleep behavior disorder, REM 09/16/2019    Priority: High    Neurology evaluating 08/2019; ? 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    . Essential hypertension, Rx  Metoprolol 10/05/2018    Priority: High  . Type 2 diabetes mellitus with complication, without long-term current use of insulin ([place]) 08/12/2016    Priority: High  . Hypothyroidism, Rx Levothyroxine 05/29/2009    Priority: High  . Hyperlipidemia associated with type 2 diabetes mellitus ([place]), Rx Crestor 09/01/2007    Priority: High  . Coronary atherosclerosis, s/p MI - s/p PCI to RCA 2001;  [facility] 7/13: 3v CAD, good LVF - >  s/p CABG 7/13 (L-LAD, S-OM1/dCFX, S-dRCA) 09/01/2007    Priority: High  . Diverticulosis 07/13/2019    Priority: Medium  . GERD 09/01/2007    Priority: Medium  . Peptic ulcer 09/01/2007  Priority: Medium  . Former smoker 07/13/2019    Priority: Low  . Testosterone deficiency 09/01/2007    Priority: Low  . Brachial neuritis or radiculitis, right 02/20/2009   Health Maintenance  Topic Date Due  . TETANUS/TDAP  08/31/2017  . FOOT EXAM  02/11/2019  . INFLUENZA VACCINE  07/03/2019  . URINE MICROALBUMIN  07/08/2019  . HEMOGLOBIN A1C  02/10/2020  . OPHTHALMOLOGY EXAM  02/11/2020  . COLONOSCOPY  05/05/2024  . Hepatitis C Screening  Completed  . PNA vac Low Risk Adult  Completed   Immunization History  Administered Date(s) Administered  . Fluad Quad(high Dose 65+) 09/16/2019  . Influenza Split 11/01/2011, 09/22/2012  . Influenza Whole 09/22/2009, 09/20/2010  . Influenza, High Dose Seasonal PF 10/03/2015, 11/11/2016, 10/13/2017, 11/20/2018  . Influenza,inj,Quad PF,6+ Mos 09/16/2013, 09/29/2014  . Pneumococcal Conjugate-13 02/25/2014  . Pneumococcal Polysaccharide-23 09/01/2007, 07/31/2011  . Td 09/01/2007   Current Meds  Medication Sig  . aspirin EC 81 MG tablet Take 81 mg by mouth daily.  Marland Kitchen levothyroxine (SYNTHROID) 100 MCG tablet Take 1 tablet (100 mcg total) by mouth daily.  . metFORMIN (GLUCOPHAGE-XR) 500 MG 24 hr tablet Take 1 tablet (500 mg total) by mouth daily with breakfast.  . metoprolol tartrate (LOPRESSOR) 25 MG tablet Take 0.5 tablets (12.5 mg  total) by mouth 2 (two) times daily.  . rosuvastatin (CRESTOR) 40 MG tablet TAKE 1 TABLET BY MOUTH ONCE DAILY OFFICE  VISIT  NEEDED    Allergies: Patient has No Known Allergies. Past Medical History Patient  has a past medical history of CAD (coronary artery disease), s/p MI - s/p PCI to RCA 2001;  [place] 7/13: 3v CAD, good LVF - >  s/p CABG 7/13 (L-LAD, S-OM1/dCFX, S-dRCA), Diabetes mellitus type 2, controlled ([place]), Diverticulosis, Early cataracts, bilateral, GERD (09/01/2007), H/O hiatal hernia, HLD (hyperlipidemia), on statin, Hypothyroidism. on Synthroid, MI, old, Neck pain, pinched nerv, Nocturia, PUD (peptic ulcer disease), Sleep apnea, patient denies, Sleep behavior disorder, REM (09/16/2019), and Testosterone deficiency. Past Surgical History Patient  has a past surgical history that includes Cardiac catheterization (2005; 06/01/2012); Tonsillectomy and adenoidectomy (1955); arm surgery [id number]); Colonoscopy (2005); Esophagogastroduodenoscopy; Coronary angioplasty with stent (2001); Coronary artery bypass graft (06/09/2012); and percutaneous coronary stent intervention (pci-s) (N/A, 06/01/2012). Family History: Patient family history includes Coronary artery disease (age of onset: 81) in his father; Memory loss in his father. Social History:  Patient  reports that he quit smoking about 7 years ago. His smoking use included pipe and cigars. He has never used smokeless tobacco. He reports current alcohol use. He reports that he does not use drugs.  Review of Systems: Constitutional: negative for fever or malaise Ophthalmic: negative for photophobia, double vision or loss of vision Cardiovascular: negative for chest pain, dyspnea on exertion, or new LE swelling Respiratory: negative for SOB or persistent cough Gastrointestinal: negative for abdominal pain, change in bowel habits or melena Genitourinary: negative for dysuria or gross hematuria Musculoskeletal: negative for new gait disturbance or  muscular weakness Integumentary: negative for new or persistent rashes Neurological: negative for TIA or stroke symptoms Psychiatric: negative for SI or delusions Allergic/Immunologic: negative for hives  Patient Care Team    Relationship Specialty Notifications Start End  Leamon Arnt, MD PCP - General Family Medicine  09/16/19   Grace Isaac, MD Consulting Physician Cardiothoracic Surgery  06/02/12   Minus Breeding, MD Consulting Physician Cardiology  06/02/12   Allyn Kenner, MD  Dermatology  09/16/19   Minus Breeding, MD Consulting  Physician Cardiology  09/16/19     Objective  Vitals: BP 124/74   Pulse 67   Temp 98 F (36.7 C) (Tympanic)   Resp 16   Ht 5\' 8"  (1.727 m)   Wt 199 lb 3.2 oz (90.4 kg)   SpO2 95%   BMI 30.29 kg/m  General:  Well developed, well nourished, no acute distress  Psych:  Alert and oriented,normal mood and affect HEENT:  Normocephalic, atraumatic, non-icteric sclera, PERRL, oropharynx is without mass or exudate, supple neck without adenopathy, mass or thyromegaly Cardiovascular:  RRR without gallop, rub or murmur, nondisplaced PMI Respiratory:  Good breath sounds bilaterally, CTAB with normal respiratory effort Gastrointestinal: normal bowel sounds, soft, non-tender, no noted masses. No HSM MSK: no deformities, contusions. Joints are without erythema or swelling Skin:  Warm, multiple sun related skin damage changes on scalp.  3 discrete flaking erythematous areas noted Neurologic:    Mental status is normal. Gross motor and sensory exams are normal. Normal gait  Cryotherapy Procedure Note  Pre-operative Diagnosis: Actinic keratosis of scalp. 3 discrete lesions  Post-operative Diagnosis: Actinic keratosis, same  Locations: scalp  Indications: precancerous  Anesthesia: none  Procedure Details   Patient informed of risks (permanent scarring, infection, light or dark discoloration, bleeding, infection, weakness, numbness and recurrence of the  lesion) and benefits of the procedure and verbal informed consent obtained. Universal time out performed  The 3 areas are treated with liquid nitrogen therapy, frozen until ice ball extended 2 mm beyond lesion, allowed to thaw, and treated again. The patient tolerated procedure well.  The patient was instructed on post-op care, warned that there may be blister formation, redness and pain. Recommend OTC analgesia as needed for pain.  Condition: Stable  Complications: none.   Commons side effects, risks, benefits, and alternatives for medications and treatment plan prescribed today were discussed, and the patient expressed understanding of the given instructions. Patient is instructed to call or message via MyChart if he/she has any questions or concerns regarding our treatment plan. No barriers to understanding were identified. We discussed Red Flag symptoms and signs in detail. Patient expressed understanding regarding what to do in case of urgent or emergency type symptoms.   Medication list was reconciled, printed and provided to the patient in AVS. Patient instructions and summary information was reviewed with the patient as documented in the AVS. This note was prepared with assistance of Dragon voice recognition software. Occasional wrong-word or sound-a-like substitutions may have occurred due to the inherent limitations of voice recognition software

## 2019-09-16 NOTE — Patient Instructions (Signed)
Please return in December for diabetes recheck.   Take the metformin ER 500mg  once a day; eat better and walk for exercise.  Today you were given your flu vaccination.    It was a pleasure meeting you today! Thank you for choosing [place] to meet your healthcare needs! I truly look forward to working with you. If you have any questions or concerns, please send me a message via Mychart or call the office at [phone].
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
## 2019-09-29 ENCOUNTER — Telehealth: Payer: Self-pay | Admitting: Neurology

## 2019-09-29 ENCOUNTER — Other Ambulatory Visit: Payer: Self-pay | Admitting: *Deleted

## 2019-09-29 DIAGNOSIS — R69 Illness, unspecified: Secondary | ICD-10-CM | POA: Diagnosis not present

## 2019-09-29 DIAGNOSIS — E118 Type 2 diabetes mellitus with unspecified complications: Secondary | ICD-10-CM

## 2019-09-29 MED ORDER — ONETOUCH DELICA LANCETS 30G MISC: 1.0000 | Freq: Once | 2 refills | Status: AC

## 2019-09-29 NOTE — Telephone Encounter (Signed)
-----   Message from Larey Seat, MD sent at 09/24/2019  2:04 PM EDT ----- Treatment for REM BD is indicated, usually first line agent is Melatonin, and if this doesn't work we try SSRI or Klonopin at lowest effective dose.

## 2019-09-29 NOTE — Telephone Encounter (Signed)
Called and spoke with the wife. She states the patient left house and should be back around 30 min. I advised I will give a call back. She was appreciative.

## 2019-10-04 ENCOUNTER — Other Ambulatory Visit: Payer: Self-pay | Admitting: Family Medicine

## 2019-10-04 DIAGNOSIS — I2510 Atherosclerotic heart disease of native coronary artery without angina pectoris: Secondary | ICD-10-CM

## 2019-10-04 NOTE — Telephone Encounter (Signed)
Pt returned call and I was able to review the sleep study results with him. Informed him that Dr Brett Fairy does feel that REM BD is present based off what the sleep study showed. Advised the patient she would recommend first the patient trying melatonin 5 mg which he is taking and states that he does sleep better with it. He will continue to take this medication. Offered to scheduled the pt with a follow up. Pt would prefer to hold off for now with scheduling as he doesn't feel its necessary. Informed the patient to reach out if he has any issues or concerns. Pt verbalized understanding. Pt had no questions at this time but was encouraged to call back if questions arise.

## 2019-11-04 NOTE — Progress Notes (Signed)
Virtual Visit via Telephone Note   This visit type was conducted due to national recommendations for restrictions regarding the COVID-19 Pandemic (e.g. social distancing) in an effort to limit this patient's exposure and mitigate transmission in our community.  Due to his co-morbid illnesses, this patient is at least at moderate risk for complications without adequate follow up.  This format is felt to be most appropriate for this patient at this time.  The patient did not have access to video technology/had technical difficulties with video requiring transitioning to audio format only (telephone).  All issues noted in this document were discussed and addressed.  No physical exam could be performed with this format.  Please refer to the patient's chart for his  consent to telehealth for [facility].   Date:  11/05/2019   ID:  Adrian Gamble, DOB 27-Mar-1946, MRN [id number]  Patient Location: Home Provider Location: Home  PCP:  Leamon Arnt, MD  Cardiologist:  Minus Breeding, MD  Electrophysiologist:  None   Evaluation Performed:  Follow-Up Visit  Chief Complaint:  CAD   History of Present Illness:    Adrian Gamble is a 73 y.o. male who presents for follow up of CAD.  He is status post remote PCI to his RCA in 2001, HL, DM2, hypothyroidism, GERD. Cardiac cath in  06/01/12 and demonstrated 3 vessel CAD with preserved LV function. CABG was recommended. He was taken off of Plavix and discharged to home and brought back on 06/09/12. He underwent CABG with Dr. Servando Snare with grafts including LIMA-LAD, SVG-OM1 and distal circumflex, SVG-distal RCA. Postop course was fairly uneventful.    Since I last saw him he has done well.  The patient denies any new symptoms such as chest discomfort, neck or arm discomfort. There has been no new shortness of breath, PND or orthopnea. There have been no reported palpitations, presyncope or syncope.  He is trying to do some walking.    The patient does not have  symptoms concerning for COVID-19 infection (fever, chills, cough, or new shortness of breath).    Past Medical History:  Diagnosis Date  . CAD (coronary artery disease), s/p MI - s/p PCI to RCA 2001;  LHC 7/13: 3v CAD, good LVF - >  s/p CABG 7/13 (L-LAD, S-OM1/dCFX, S-dRCA)   . Diabetes mellitus type 2, controlled ([place])   . Diverticulosis   . Early cataracts, bilateral   . GERD 09/01/2007  . H/O hiatal hernia   . HLD (hyperlipidemia), on statin   . Hypothyroidism. on Synthroid   . Nocturia   . PUD (peptic ulcer disease)   . Sleep apnea, patient denies   . 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 Testosterone deficiency    Past Surgical History:  Procedure Laterality Date  . arm surgery  1957   d/t broken  . CARDIAC CATHETERIZATION  2005; 06/01/2012  . COLONOSCOPY  2005  . CORONARY ANGIOPLASTY WITH STENT PLACEMENT  2001   RCA-1 stent  . CORONARY ARTERY BYPASS GRAFT  06/09/2012   Procedure: CORONARY ARTERY BYPASS GRAFTING (CABG);  Surgeon: Grace Isaac, MD;  Location: [facility];  Service: Open Heart Surgery;  Laterality: N/A;  CABG x four;  using left internal mammary artery and right leg greater saphenous vein harvested endoscopically  . ESOPHAGOGASTRODUODENOSCOPY    . PERCUTANEOUS CORONARY STENT INTERVENTION (PCI-S) N/A 06/01/2012   Procedure: PERCUTANEOUS CORONARY STENT INTERVENTION (PCI-S);  Surgeon: Burnell Blanks, MD;  Location: [facility] CATH LAB;  Service:  Cardiovascular;  Laterality: N/A;  . TONSILLECTOMY AND ADENOIDECTOMY  1955   at age 55     Current Meds  Medication Sig  . aspirin EC 81 MG tablet Take 81 mg by mouth daily.  . clonazePAM (KLONOPIN) 0.5 MG tablet Use 1/2 tablet PRN at bedtime for REM sleep suppression. May take second half if needed.  Marland Kitchen glucose blood test strip Check Blood sugars 2-4 times a day as directed.  Marland Kitchen levothyroxine (SYNTHROID) 100 MCG tablet Take 1 tablet (100 mcg total) by mouth daily.  . metFORMIN (GLUCOPHAGE-XR) 500 MG 24 hr  tablet Take 1 tablet (500 mg total) by mouth daily with breakfast.  . metoprolol tartrate (LOPRESSOR) 25 MG tablet Take 1/2 (one-half) tablet by mouth twice daily  . rosuvastatin (CRESTOR) 40 MG tablet TAKE 1 TABLET BY MOUTH ONCE DAILY OFFICE  VISIT  NEEDED     Allergies:   Patient has no known allergies.   Social History   Tobacco Use  . Smoking status: Former Smoker    Types: Pipe, Cigars    Quit date: 06/01/2012    Years since quitting: 7.4  . Smokeless tobacco: Never Used  . Tobacco comment: patient states that he is not somking the pipe or cigars any more  Substance Use Topics  . Alcohol use: Yes    Comment: rare  . Drug use: No     Family Hx: The patient's family history includes Coronary artery disease (age of onset: 75) in his father; Memory loss in his father. There is no history of Colon cancer.  ROS:   Please see the history of present illness.    As stated in the HPI and negative for all other systems.    Prior CV studies:   The following studies were reviewed today:  None  Labs/Other Tests and Data Reviewed:    EKG:  No ECG reviewed.  Recent Labs: 07/19/2019: TSH 1.27 08/13/2019: ALT 16; BUN 13; Creatinine, Ser 0.96; Hemoglobin 15.1; Platelets 134.0; Potassium 4.1; Sodium 138   Recent Lipid Panel Lab Results  Component Value Date/Time   CHOL 121 01/12/2019 10:58 AM   TRIG 80.0 01/12/2019 10:58 AM   HDL 37.00 (L) 01/12/2019 10:58 AM   CHOLHDL 3 01/12/2019 10:58 AM   LDLCALC 68 01/12/2019 10:58 AM    Wt Readings from Last 3 Encounters:  11/05/19 195 lb (88.5 kg)  09/16/19 199 lb 3.2 oz (90.4 kg)  08/24/19 202 lb (91.6 kg)     Objective:    Vital Signs:  Ht 5\' 8"  (1.727 m)   Wt 195 lb (88.5 kg)   BMI 29.65 kg/m    VITAL SIGNS:  reviewed  ASSESSMENT & PLAN:     CAD  The patient has no new sypmtoms.  No further cardiovascular testing is indicated.  We will continue with aggressive risk reduction and meds as listed.  DM His A1C has been  creeping up but it is OK.  No change in therapy.   Hyperlipidemia  LDL was at target and he will have this repeated at his next visit with Leamon Arnt, MD   HTN His blood pressure has been normal at previous visits.  He does not have a cuff.  No change in therapy.   COVID-19 Education: The signs and symptoms of COVID-19 were discussed with the patient and how to seek care for testing (follow up with PCP or arrange E-visit).  We talked about the vaccine. The importance of social distancing was discussed today.  Time:   Today, I have spent 16 minutes with the patient with telehealth technology discussing the above problems.     Medication Adjustments/Labs and Tests Ordered: Current medicines are reviewed at length with the patient today.  Concerns regarding medicines are outlined above.   Tests Ordered: No orders of the defined types were placed in this encounter.   Medication Changes: No orders of the defined types were placed in this encounter.   Follow Up:  In Person in one year.  Signed, Minus Breeding, MD  11/05/2019 10:54 AM    [facility]

## 2019-11-05 ENCOUNTER — Telehealth (INDEPENDENT_AMBULATORY_CARE_PROVIDER_SITE_OTHER): Payer: Medicare HMO | Admitting: Cardiology

## 2019-11-05 ENCOUNTER — Encounter: Payer: Self-pay | Admitting: Cardiology

## 2019-11-05 VITALS — Ht 68.0 in | Wt 195.0 lb

## 2019-11-05 DIAGNOSIS — I10 Essential (primary) hypertension: Secondary | ICD-10-CM

## 2019-11-05 DIAGNOSIS — I2510 Atherosclerotic heart disease of native coronary artery without angina pectoris: Secondary | ICD-10-CM

## 2019-11-05 DIAGNOSIS — E785 Hyperlipidemia, unspecified: Secondary | ICD-10-CM

## 2019-11-05 DIAGNOSIS — E118 Type 2 diabetes mellitus with unspecified complications: Secondary | ICD-10-CM

## 2019-11-05 NOTE — Patient Instructions (Signed)
Medication Instructions:  Your physician recommends that you continue on your current medications as directed. Please refer to the Current Medication list given to you today.  *If you need a refill on your cardiac medications before your next appointment, please call your pharmacy*  Lab Work: none If you have labs (blood work) drawn today and your tests are completely normal, you will receive your results only by: Marland Kitchen MyChart Message (if you have MyChart) OR . A paper copy in the mail If you have any lab test that is abnormal or we need to change your treatment, we will call you to review the results.  Testing/Procedures: none  Follow-Up: At [facility], you and your health needs are our priority.  As part of our continuing mission to provide you with exceptional heart care, we have created designated Provider Care Teams.  These Care Teams include your primary Cardiologist (physician) and Advanced Practice Providers (APPs -  Physician Assistants and Nurse Practitioners) who all work together to provide you with the care you need, when you need it.  Your next appointment:   12 month(s)  The format for your next appointment:   Either In Person or Virtual  Provider:   Minus Breeding, MD

## 2019-11-11 ENCOUNTER — Other Ambulatory Visit: Payer: Self-pay

## 2019-11-12 ENCOUNTER — Encounter: Payer: Self-pay | Admitting: Family Medicine

## 2019-11-12 ENCOUNTER — Ambulatory Visit (INDEPENDENT_AMBULATORY_CARE_PROVIDER_SITE_OTHER): Payer: Medicare HMO | Admitting: Family Medicine

## 2019-11-12 VITALS — BP 126/78 | HR 70 | Temp 97.5°F | Ht 68.0 in | Wt 203.0 lb

## 2019-11-12 DIAGNOSIS — I2510 Atherosclerotic heart disease of native coronary artery without angina pectoris: Secondary | ICD-10-CM

## 2019-11-12 DIAGNOSIS — E1169 Type 2 diabetes mellitus with other specified complication: Secondary | ICD-10-CM

## 2019-11-12 DIAGNOSIS — E785 Hyperlipidemia, unspecified: Secondary | ICD-10-CM | POA: Diagnosis not present

## 2019-11-12 DIAGNOSIS — I10 Essential (primary) hypertension: Secondary | ICD-10-CM

## 2019-11-12 DIAGNOSIS — E118 Type 2 diabetes mellitus with unspecified complications: Secondary | ICD-10-CM

## 2019-11-12 LAB — POCT GLYCOSYLATED HEMOGLOBIN (HGB A1C): Hemoglobin A1C: 6.4 % — AB (ref 4.0–5.6)

## 2019-11-12 NOTE — Progress Notes (Signed)
Subjective  CC:  Chief Complaint  Patient presents with  . Diabetes  . Hypertension  . Hyperlipidemia    HPI: Adrian Gamble is a 73 y.o. male who presents to the office today for follow up of diabetes, hypertension and problems listed above in the chief complaint.   Diabetic f/u: His diabetic control is reported as Unchanged. Has occ high bs but mostly controlled. No sxs of hyperglycemia. Recent urine microalbuminuria was positive; this is a change from last year. He is not on and ace. This ratio was < 30.  He denies exertional CP or SOB or symptomatic hypoglycemia. He denies foot sores.    Hypertension f/u: Control is good . Pt reports he is doing well. taking medications as instructed, no medication side effects noted, no TIAs, no chest pain on exertion, no dyspnea on exertion, no swelling of ankles. He is on low dose bb for CAD and ? bp control. He denies adverse effects from his BP medications. Compliance with medication is good.    Hyperlipidemia f/u: at goal on statin. Next due in march.   CAD: stable. On low dose bb and asa. Reviewed dr. Cherlyn Cushing note.   Assessment  1. Type 2 diabetes mellitus with complication, without long-term current use of insulin ([place])   2. Essential hypertension, Rx Metoprolol   3. Hyperlipidemia associated with type 2 diabetes mellitus ([place]), Rx Crestor   4. Atherosclerosis of native coronary artery of native heart without angina pectoris      Plan   Diabetes is currently well controlled. Could increase metformin to lower a1c a bit more but pt declines. He will work on diet. rec consideration of low dose ace. Will reassess in 3 months. If worsening microalbuminuria, will add.   Hypertension is currently very well controlled.   Hyperlipidemia f/u:  Very well controlled.   CAD: stable. Has annual f/u with cards. Diabetic education: ongoing education regarding chronic disease management for diabetes was given today. We continue to reinforce the  ABC's of diabetic management: A1c (<7 or 8 dependent upon patient), tight blood pressure control, and cholesterol management with goal LDL < 100 minimally. We discuss diet strategies, exercise recommendations, medication options and possible side effects. At each visit, we review recommended immunizations and preventive care recommendations for diabetics and stress that good diabetic control can prevent other problems. See below for this patient's data. Hypertension education: ongoing education regarding management of these chronic disease states was given. Management strategies discussed on successive visits include dietary and exercise recommendations, goals of achieving and maintaining IBW, and lifestyle modifications aiming for adequate sleep and minimizing stressors.   Follow up: March 2021 for cpe and DM f/u.  Orders Placed This Encounter  Procedures  . POCT glycosylated hemoglobin (Hb A1C)   No orders of the defined types were placed in this encounter.     I reviewed the patients updated PMH, FH, and SocHx.  Patient Active Problem List   Diagnosis Date Noted  . 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 Essential hypertension, Rx Metoprolol 10/05/2018    Priority: High  . Type 2 diabetes mellitus with complication, without long-term current use of insulin ([place]) 08/12/2016    Priority: High  . Hypothyroidism, Rx Levothyroxine 05/29/2009    Priority: High  . Hyperlipidemia associated with type 2 diabetes mellitus ([place]), Rx Crestor 09/01/2007    Priority: High  . Coronary atherosclerosis, s/p MI - s/p PCI to RCA 2001;  LHC 7/13: 3v CAD,  good LVF - >  s/p CABG 7/13 (L-LAD, S-OM1/dCFX, S-dRCA) 09/01/2007    Priority: High  . Diverticulosis 07/13/2019    Priority: Medium  . GERD 09/01/2007    Priority: Medium  . Peptic ulcer 09/01/2007    Priority: Medium  . Former smoker 07/13/2019    Priority: Low  . Testosterone deficiency 09/01/2007    Priority: Low  .  Brachial neuritis or radiculitis, right 02/20/2009   Immunization History  Administered Date(s) Administered  . Fluad Quad(high Dose 65+) 09/16/2019  . Influenza Split 11/01/2011, 09/22/2012  . Influenza Whole 09/22/2009, 09/20/2010  . Influenza, High Dose Seasonal PF 10/03/2015, 11/11/2016, 10/13/2017, 11/20/2018  . Influenza,inj,Quad PF,6+ Mos 09/16/2013, 09/29/2014  . Pneumococcal Conjugate-13 02/25/2014  . Pneumococcal Polysaccharide-23 09/01/2007, 07/31/2011  . Td 09/01/2007   Health Maintenance  Topic Date Due  . TETANUS/TDAP  08/31/2017  . OPHTHALMOLOGY EXAM  02/11/2020  . HEMOGLOBIN A1C  05/12/2020  . URINE MICROALBUMIN  09/15/2020  . FOOT EXAM  11/11/2020  . COLONOSCOPY  05/05/2024  . INFLUENZA VACCINE  Completed  . Hepatitis C Screening  Completed  . PNA vac Low Risk Adult  Completed   Diabetes and HTN Related Lab Review: Lab Results  Component Value Date   HGBA1C 6.4 (A) 11/12/2019   HGBA1C 6.7 (H) 08/13/2019   HGBA1C 6.4 (A) 01/12/2019    Lab Results  Component Value Date   MICROALBUR 21.4 (H) 09/16/2019   Lab Results  Component Value Date   CREATININE 0.96 08/13/2019   BUN 13 08/13/2019   NA 138 08/13/2019   K 4.1 08/13/2019   CL 103 08/13/2019   CO2 26 08/13/2019   Lab Results  Component Value Date   CHOL 121 01/12/2019   CHOL 143 07/07/2018   CHOL 117 07/07/2017   Lab Results  Component Value Date   HDL 37.00 (L) 01/12/2019   HDL 36.70 (L) 07/07/2018   HDL 37.60 (L) 07/07/2017   Lab Results  Component Value Date   LDLCALC 68 01/12/2019   LDLCALC 71 07/07/2018   LDLCALC 63 07/07/2017   Lab Results  Component Value Date   TRIG 80.0 01/12/2019   TRIG 177.0 (H) 07/07/2018   TRIG 81.0 07/07/2017   Lab Results  Component Value Date   CHOLHDL 3 01/12/2019   CHOLHDL 4 07/07/2018   CHOLHDL 3 07/07/2017   No results found for: LDLDIRECT The ASCVD Risk score Mikey Bussing DC Jr., et al., 2013) failed to calculate for the following reasons:    The patient has a prior MI or stroke diagnosis  BP Readings from Last 3 Encounters:  11/12/19 126/78  09/16/19 124/74  08/24/19 124/76   Wt Readings from Last 3 Encounters:  11/12/19 203 lb (92.1 kg)  11/05/19 195 lb (88.5 kg)  09/16/19 199 lb 3.2 oz (90.4 kg)    Allergies: Patient has No Known Allergies. Family History: Patient family history includes Coronary artery disease (age of onset: 62) in his father; Memory loss in his father. Social History:  Patient  reports that he quit smoking about 7 years ago. His smoking use included pipe and cigars. He has never used smokeless tobacco. He reports current alcohol use. He reports that he does not use drugs.  Review of Systems: Ophthalmic: negative for eye pain, loss of vision or double vision Cardiovascular: negative for chest pain Respiratory: negative for SOB or persistent cough Gastrointestinal: negative for abdominal pain Genitourinary: negative for dysuria or gross hematuria MSK: negative for foot lesions Neurologic: negative for weakness  or gait disturbance Current Meds  Medication Sig  . aspirin EC 81 MG tablet Take 81 mg by mouth daily.  Marland Kitchen glucose blood test strip Check Blood sugars 2-4 times a day as directed.  Marland Kitchen levothyroxine (SYNTHROID) 100 MCG tablet Take 1 tablet (100 mcg total) by mouth daily.  Marland Kitchen MELATONIN ER PO Take by mouth.  . metFORMIN (GLUCOPHAGE-XR) 500 MG 24 hr tablet Take 1 tablet (500 mg total) by mouth daily with breakfast.  . metoprolol tartrate (LOPRESSOR) 25 MG tablet Take 1/2 (one-half) tablet by mouth twice daily  . rosuvastatin (CRESTOR) 40 MG tablet TAKE 1 TABLET BY MOUTH ONCE DAILY OFFICE  VISIT  NEEDED    Objective  Vitals: BP 126/78 (BP Location: Left Arm, Patient Position: Sitting, Cuff Size: Normal)   Pulse 70   Temp (!) 97.5 F (36.4 C) (Temporal)   Ht 5\' 8"  (1.727 m)   Wt 203 lb (92.1 kg)   SpO2 96%   BMI 30.87 kg/m  General: well appearing, no acute distress  Psych:  Alert and  oriented, normal mood and affect HEENT:  Normocephalic, atraumatic Cardiovascular:  Nl S1 and S2, RRR without murmur, gallop or rub. no edema Respiratory:  Good breath sounds bilaterally, CTAB with normal effort, no rales Skin:  Warm, no rashes Neurologic:   Mental status is normal.  Foot exam: no erythema, pallor, or cyanosis visible nl proprioception and sensation to monofilament testing bilaterally, +2 distal pulses bilaterally   Commons side effects, risks, benefits, and alternatives for medications and treatment plan prescribed today were discussed, and the patient expressed understanding of the given instructions. Patient is instructed to call or message via MyChart if he/she has any questions or concerns regarding our treatment plan. No barriers to understanding were identified. We discussed Red Flag symptoms and signs in detail. Patient expressed understanding regarding what to do in case of urgent or emergency type symptoms.   Medication list was reconciled, printed and provided to the patient in AVS. Patient instructions and summary information was reviewed with the patient as documented in the AVS. This note was prepared with assistance of Dragon voice recognition software. Occasional wrong-word or sound-a-like substitutions may have occurred due to the inherent limitations of voice recognition software

## 2019-11-12 NOTE — Patient Instructions (Signed)
Please return in March 2021 for your annual complete physical; please come fasting. We will recheck your urine and labs at that time.  I have not changed any medications today.  Things look great.   Happy Holidays to you and yours.   If you have any questions or concerns, please don't hesitate to send me a message via MyChart or call the office at [phone]. Thank you for visiting with [place] today! It's our pleasure caring for you.

## 2019-11-29 ENCOUNTER — Other Ambulatory Visit: Payer: Self-pay | Admitting: Family Medicine

## 2019-11-29 DIAGNOSIS — E785 Hyperlipidemia, unspecified: Secondary | ICD-10-CM

## 2019-11-29 DIAGNOSIS — E1169 Type 2 diabetes mellitus with other specified complication: Secondary | ICD-10-CM

## 2020-01-12 ENCOUNTER — Other Ambulatory Visit: Payer: Self-pay | Admitting: Family Medicine

## 2020-01-12 DIAGNOSIS — E118 Type 2 diabetes mellitus with unspecified complications: Secondary | ICD-10-CM

## 2020-01-14 ENCOUNTER — Telehealth: Payer: Self-pay

## 2020-01-14 ENCOUNTER — Other Ambulatory Visit: Payer: Self-pay | Admitting: Family Medicine

## 2020-01-14 DIAGNOSIS — R69 Illness, unspecified: Secondary | ICD-10-CM | POA: Diagnosis not present

## 2020-01-14 DIAGNOSIS — E118 Type 2 diabetes mellitus with unspecified complications: Secondary | ICD-10-CM

## 2020-01-14 NOTE — Telephone Encounter (Signed)
MEDICATION:glucose blood test strip and one touch  PHARMACY: [facility], [place] [address] Phone:  [phone]  Fax:  [phone]       Comments:   **Let patient know to contact pharmacy at the end of the day to make sure medication is ready. **  ** Please notify patient to allow 48-72 hours to process**  **Encourage patient to contact the pharmacy for refills or they can request refills through [facility]**

## 2020-01-14 NOTE — Telephone Encounter (Signed)
Glucose test strips sent to [place]

## 2020-01-19 ENCOUNTER — Telehealth: Payer: Self-pay | Admitting: Family Medicine

## 2020-01-19 NOTE — Telephone Encounter (Signed)
Patient called In saying that [employer] refused payment on his test strips and when he spoke with them on the phone they told him, Dr.Andy needed to call [phone] to go over clinical questions as to why he needs the test strips.

## 2020-01-25 NOTE — Telephone Encounter (Signed)
Spoke with [employer] to complete PA for test strips. Informed that a PA is not needed for a 90 day supply of 300 test strips.   Case No. [id number]  Spoke with [facility] to inform, states that pt has already picked up rx at no charge.

## 2020-02-07 ENCOUNTER — Telehealth: Payer: Self-pay | Admitting: Family Medicine

## 2020-02-07 ENCOUNTER — Other Ambulatory Visit: Payer: Self-pay

## 2020-02-07 DIAGNOSIS — E039 Hypothyroidism, unspecified: Secondary | ICD-10-CM

## 2020-02-07 MED ORDER — LEVOTHYROXINE SODIUM 100 MCG PO TABS: 100.0000 ug | ORAL_TABLET | Freq: Every day | ORAL | 3 refills | Status: DC

## 2020-02-07 NOTE — Telephone Encounter (Signed)
Levothyroxine sent to patient's pharmacy.

## 2020-02-07 NOTE — Telephone Encounter (Signed)
.. °.. °  LAST APPOINTMENT DATE: 01/19/2020   NEXT APPOINTMENT DATE:@5 /26/2021  MEDICATION: Levothyroxine 100 MCG  PHARMACY: [facility] on [address]  **Let patient know to contact pharmacy at the end of the day to make sure medication is ready. **  ** Please notify patient to allow 48-72 hours to process**  **Encourage patient to contact the pharmacy for refills or they can request refills through [facility]**  CLINICAL FILLS OUT ALL BELOW:   LAST REFILL:  QTY:  REFILL DATE:    OTHER COMMENTS: Patients pharmacy has been submitting these refill request to Dr. Juleen China at her new office location.  I have advised for the patient to update this information at his pharmacy.    Okay for refill?  Please advise

## 2020-03-02 ENCOUNTER — Encounter: Payer: Medicare HMO | Admitting: Family Medicine

## 2020-03-14 ENCOUNTER — Other Ambulatory Visit: Payer: Self-pay | Admitting: Family Medicine

## 2020-03-14 DIAGNOSIS — E1169 Type 2 diabetes mellitus with other specified complication: Secondary | ICD-10-CM

## 2020-03-14 DIAGNOSIS — E785 Hyperlipidemia, unspecified: Secondary | ICD-10-CM

## 2020-03-27 ENCOUNTER — Other Ambulatory Visit: Payer: Self-pay | Admitting: Family Medicine

## 2020-03-27 DIAGNOSIS — I2510 Atherosclerotic heart disease of native coronary artery without angina pectoris: Secondary | ICD-10-CM

## 2020-04-25 DIAGNOSIS — R69 Illness, unspecified: Secondary | ICD-10-CM | POA: Diagnosis not present

## 2020-04-26 ENCOUNTER — Ambulatory Visit (INDEPENDENT_AMBULATORY_CARE_PROVIDER_SITE_OTHER): Payer: Medicare HMO | Admitting: Family Medicine

## 2020-04-26 ENCOUNTER — Encounter: Payer: Self-pay | Admitting: Family Medicine

## 2020-04-26 ENCOUNTER — Other Ambulatory Visit: Payer: Self-pay

## 2020-04-26 ENCOUNTER — Ambulatory Visit (INDEPENDENT_AMBULATORY_CARE_PROVIDER_SITE_OTHER): Payer: Medicare HMO

## 2020-04-26 VITALS — BP 118/68 | HR 71 | Temp 98.2°F | Resp 16 | Ht 68.0 in | Wt 211.0 lb

## 2020-04-26 DIAGNOSIS — Z951 Presence of aortocoronary bypass graft: Secondary | ICD-10-CM

## 2020-04-26 DIAGNOSIS — M25561 Pain in right knee: Secondary | ICD-10-CM

## 2020-04-26 DIAGNOSIS — I2510 Atherosclerotic heart disease of native coronary artery without angina pectoris: Secondary | ICD-10-CM | POA: Diagnosis not present

## 2020-04-26 DIAGNOSIS — I252 Old myocardial infarction: Secondary | ICD-10-CM | POA: Diagnosis not present

## 2020-04-26 DIAGNOSIS — Z0000 Encounter for general adult medical examination without abnormal findings: Secondary | ICD-10-CM

## 2020-04-26 DIAGNOSIS — E118 Type 2 diabetes mellitus with unspecified complications: Secondary | ICD-10-CM

## 2020-04-26 DIAGNOSIS — I10 Essential (primary) hypertension: Secondary | ICD-10-CM | POA: Diagnosis not present

## 2020-04-26 DIAGNOSIS — E039 Hypothyroidism, unspecified: Secondary | ICD-10-CM

## 2020-04-26 DIAGNOSIS — M25562 Pain in left knee: Secondary | ICD-10-CM

## 2020-04-26 DIAGNOSIS — E1169 Type 2 diabetes mellitus with other specified complication: Secondary | ICD-10-CM | POA: Diagnosis not present

## 2020-04-26 DIAGNOSIS — E785 Hyperlipidemia, unspecified: Secondary | ICD-10-CM | POA: Diagnosis not present

## 2020-04-26 DIAGNOSIS — M1711 Unilateral primary osteoarthritis, right knee: Secondary | ICD-10-CM | POA: Diagnosis not present

## 2020-04-26 DIAGNOSIS — M1712 Unilateral primary osteoarthritis, left knee: Secondary | ICD-10-CM | POA: Diagnosis not present

## 2020-04-26 LAB — COMPREHENSIVE METABOLIC PANEL
ALT: 18 U/L (ref 0–53)
AST: 17 U/L (ref 0–37)
Albumin: 4.4 g/dL (ref 3.5–5.2)
Alkaline Phosphatase: 54 U/L (ref 39–117)
BUN: 13 mg/dL (ref 6–23)
CO2: 27 mEq/L (ref 19–32)
Calcium: 9.1 mg/dL (ref 8.4–10.5)
Chloride: 106 mEq/L (ref 96–112)
Creatinine, Ser: 1.05 mg/dL (ref 0.40–1.50)
GFR: 68.74 mL/min (ref >60.00)
Glucose, Bld: 169 mg/dL — ABNORMAL HIGH (ref 70–99)
Potassium: 3.9 mEq/L (ref 3.5–5.1)
Sodium: 141 mEq/L (ref 135–145)
Total Bilirubin: 0.6 mg/dL (ref 0.2–1.2)
Total Protein: 6.7 g/dL (ref 6.0–8.3)

## 2020-04-26 LAB — CBC WITH DIFFERENTIAL/PLATELET
Basophils Absolute: 0 10*3/uL (ref 0.0–0.1)
Basophils Relative: 0.6 % (ref 0.0–3.0)
Eosinophils Absolute: 0.1 10*3/uL (ref 0.0–0.7)
Eosinophils Relative: 1.2 % (ref 0.0–5.0)
HCT: 43.4 % (ref 39.0–52.0)
Hemoglobin: 14.6 g/dL (ref 13.0–17.0)
Lymphocytes Relative: 22 % (ref 12.0–46.0)
Lymphs Abs: 1.3 10*3/uL (ref 0.7–4.0)
MCHC: 33.7 g/dL (ref 30.0–36.0)
MCV: 91.2 fl (ref 78.0–100.0)
Monocytes Absolute: 0.5 10*3/uL (ref 0.1–1.0)
Monocytes Relative: 9.5 % (ref 3.0–12.0)
Neutro Abs: 3.8 10*3/uL (ref 1.4–7.7)
Neutrophils Relative %: 66.7 % (ref 43.0–77.0)
Platelets: 150 10*3/uL (ref 150.0–400.0)
RBC: 4.75 Mil/uL (ref 4.22–5.81)
RDW: 13 % (ref 11.5–15.5)
WBC: 5.7 10*3/uL (ref 4.0–10.5)

## 2020-04-26 LAB — LIPID PANEL
Cholesterol: 131 mg/dL (ref 0–200)
HDL: 37.9 mg/dL — ABNORMAL LOW (ref >39.00)
LDL Cholesterol: 73 mg/dL (ref 0–99)
NonHDL: 93.25
Total CHOL/HDL Ratio: 3
Triglycerides: 100 mg/dL (ref 0.0–149.0)
VLDL: 20 mg/dL (ref 0.0–40.0)

## 2020-04-26 LAB — MICROALBUMIN / CREATININE URINE RATIO
Creatinine,U: 198.7 mg/dL
Microalb Creat Ratio: 7.9 mg/g (ref 0.0–30.0)
Microalb, Ur: 15.8 mg/dL — ABNORMAL HIGH (ref 0.0–1.9)

## 2020-04-26 LAB — POCT GLYCOSYLATED HEMOGLOBIN (HGB A1C): Hemoglobin A1C: 7.5 % — AB (ref 4.0–5.6)

## 2020-04-26 LAB — TSH: TSH: 8.41 u[IU]/mL — ABNORMAL HIGH (ref 0.35–4.50)

## 2020-04-26 MED ORDER — SHINGRIX 50 MCG/0.5ML IM SUSR: 0.5000 mL | Freq: Once | INTRAMUSCULAR | 0 refills | Status: AC

## 2020-04-26 MED ORDER — DAPAGLIFLOZIN PROPANEDIOL 10 MG PO TABS: 10.0000 mg | ORAL_TABLET | Freq: Every day | ORAL | 3 refills | Status: DC

## 2020-04-26 NOTE — Patient Instructions (Signed)
Please return in 3 months for diabetes follow up  I will release your lab results to you on your MyChart account with further instructions. Please reply with any questions.   Please start the [place] daily in addition to the metformin to help control your diabetes.  Let me know if a different medication in this class has better coverager (SGLT 2 inhibitor).  I suspect you may have arthritis in your knees. Use ice, tylenol or topical voltaren gel to help as needed.   If you have any questions or concerns, please don't hesitate to send me a message via MyChart or call the office at [phone]. Thank you for visiting with Adrian Gamble today! It's our pleasure caring for you.  Dapagliflozin tablets  What is this medicine? DAPAGLIFLOZIN (DAP a gli FLOE zin) controls blood sugar in people with diabetes. It is used with lifestyle changes like diet and exercise. It also treats heart failure. It may lower the need for treatment of heart failure in the hospital. This medicine may be used for other purposes; ask your health care provider or pharmacist if you have questions. COMMON BRAND NAME(S): [name] What should I tell my health care provider before I take this medicine? They need to know if you have any of these conditions:  dehydration  diabetic ketoacidosis  diet low in salt  eating less due to illness, surgery, dieting, or any other reason  having surgery  history of pancreatitis or pancreas problems  history of yeast infection of the penis or vagina  if you often drink alcohol  infections in the bladder, kidneys, or urinary tract  kidney disease  low blood pressure  on hemodialysis  problems urinating  type 1 diabetes  uncircumcised male  an unusual or allergic reaction to dapagliflozin, other medicines, foods, dyes, or preservatives  pregnant or trying to get pregnant  breast-feeding How should I use this medicine? Take this medicine by mouth with a glass of water. Follow  the directions on the prescription label. You can take it with or without food. If it upsets your stomach, take it with food. Take this medicine in the morning. Take your dose at the same time each day. Do not take more often than directed. Do not stop taking except on your doctor's advice. A special MedGuide will be given to you by the pharmacist with each prescription and refill. Be sure to read this information carefully each time. Talk to your pediatrician regarding the use of this medicine in children. Special care may be needed. Overdosage: If you think you have taken too much of this medicine contact a poison control center or emergency room at once. NOTE: This medicine is only for you. Do not share this medicine with others. What if I miss a dose? If you miss a dose, take it as soon as you can. If it is almost time for your next dose, take only that dose. Do not take double or extra doses. What may interact with this medicine? Do not take this medicine with any of the following medications:  gatifloxacin This medicine may also interact with the following medications:  alcohol  certain medicines for blood pressure, heart disease  diuretics  insulin  nateglinide  pioglitazone  quinolone antibiotics like ciprofloxacin, levofloxacin, ofloxacin  repaglinide  some herbal dietary supplements  steroid medicines like prednisone or cortisone  sulfonylureas like glimepiride, glipizide, glyburide  thyroid medicine This list may not describe all possible interactions. Give your health care provider a list of all  the medicines, herbs, non-prescription drugs, or dietary supplements you use. Also tell them if you smoke, drink alcohol, or use illegal drugs. Some items may interact with your medicine. What should I watch for while using this medicine? Visit your doctor or health care professional for regular checks on your progress. This medicine can cause a serious condition in which  there is too much acid in the blood. If you develop nausea, vomiting, stomach pain, unusual tiredness, or breathing problems, stop taking this medicine and call your doctor right away. If possible, use a ketone dipstick to check for ketones in your urine. A test called the HbA1C (A1C) will be monitored. This is a simple blood test. It measures your blood sugar control over the last 2 to 3 months. You will receive this test every 3 to 6 months. Learn how to check your blood sugar. Learn the symptoms of low and high blood sugar and how to manage them. Always carry a quick-source of sugar with you in case you have symptoms of low blood sugar. Examples include hard sugar candy or glucose tablets. Make sure others know that you can choke if you eat or drink when you develop serious symptoms of low blood sugar, such as seizures or unconsciousness. They must get medical help at once. Tell your doctor or health care professional if you have high blood sugar. You might need to change the dose of your medicine. If you are sick or exercising more than usual, you might need to change the dose of your medicine. Do not skip meals. Ask your doctor or health care professional if you should avoid alcohol. Many nonprescription cough and cold products contain sugar or alcohol. These can affect blood sugar. Wear a medical ID bracelet or chain, and carry a card that describes your disease and details of your medicine and dosage times. What side effects may I notice from receiving this medicine? Side effects that you should report to your doctor or health care professional as soon as possible:  allergic reactions like skin rash, itching or hives, swelling of the face, lips, or tongue  breathing problems  dizziness  feeling faint or lightheaded, falls  muscle weakness  nausea, vomiting, unusual stomach upset or pain  new pain or tenderness, change in skin color, sores or ulcers, or infection in legs or feet  penile  discharge, itching, or pain in men  signs and symptoms of a genital infection, such as fever; tenderness, redness, or swelling in the genitals or area from the genitals to the back of the rectum  signs and symptoms of low blood sugar such as feeling anxious, confusion, dizziness, increased hunger, unusually weak or tired, sweating, shakiness, cold, irritable, headache, blurred vision, fast heartbeat, loss of consciousness  signs and symptoms of a urinary tract infection, such as fever, chills, a burning feeling when urinating, blood in the urine, back pain  trouble passing urine or change in the amount of urine, including an urgent need to urinate more often, in larger amounts, or at night  unusual tiredness  vaginal discharge, itching, or odor in women Side effects that usually do not require medical attention (report to your doctor or health care professional if they continue or are bothersome):  mild increase in urination  thirsty This list may not describe all possible side effects. Call your doctor for medical advice about side effects. You may report side effects to FDA at 1-800-FDA-1088. Where should I keep my medicine? Keep out of the reach of  children. Store at room temperature between 15 and 30 degrees C (59 and 86 degrees F). Throw away any unused medicine after the expiration date. NOTE: This sheet is a summary. It may not cover all possible information. If you have questions about this medicine, talk to your doctor, pharmacist, or health care provider.  2020 Elsevier/Gold Standard (2019-04-08 18:58:14)

## 2020-04-26 NOTE — Addendum Note (Signed)
Addended by: Francis Dowse T on: 04/26/2020 12:20 PM   Modules accepted: Orders

## 2020-04-26 NOTE — Progress Notes (Signed)
Subjective  Chief Complaint  Patient presents with  . Annual Exam  . Knee Pain    both but greater pain in left knee - worse in AM  . Diverticulitis    bilateral  lower abdominal pain  . Diabetes    >190 readings at home    HPI: Adrian Gamble is a 74 y.o. male who presents to [facility] [place] today for a Male Wellness Visit. He also has the concerns and/or needs as listed above in the chief complaint. These will be addressed in addition to the Health Maintenance Visit.   Wellness Visit: annual visit with health maintenance review and exam    HM: screens up to date; due shingrix, weight is up in spite of trying to eat better. Lifestyle: Body mass index is 32.08 kg/m. Wt Readings from Last 3 Encounters:  04/26/20 211 lb (95.7 kg)  11/12/19 203 lb (92.1 kg)  11/05/19 195 lb (88.5 kg)    Chronic disease management visit and/or acute problem visit:  Diabetes follow up: His diabetic control is reported as Worse. fastings are worsening even when he eats well. + increased urination. On low dose metformin only due to GI intolerances in the past.  He denies exertional CP or SOB or symptomatic hypoglycemia. He denies foot sores or paresthesias.  HTN: well controlled on BB due to CAD. No ACE, Pos urine microalbuminuria with neg ratio.   CAD w/o angina. No claudication sxs.  Low thyroid due for recheck. Denies sxs of low or high thyroid although his weight is increasing. No LE edema or skin changes no fatigue  C/o 2-3 months of am knee stiffness but worsening over the last 3 weeks: am medical pain and stiffness x 5-10 minutes, then improves. No locking or give way. No swelling or injury no h/o knee problems. No other joint pain  Lower abdominal pain, typically will get pains on left but recently mild pain bilaterally. No associated n/v/d but some constipation. No urinary sxs. Nl appetite. Comes and goes, is mild and brief.   Immunization History  Administered  Date(s) Administered  . Fluad Quad(high Dose 65+) 09/16/2019  . Influenza Split 11/01/2011, 09/22/2012  . Influenza Whole 09/22/2009, 09/20/2010  . Influenza, High Dose Seasonal PF 10/03/2015, 11/11/2016, 10/13/2017, 11/20/2018  . Influenza,inj,Quad PF,6+ Mos 09/16/2013, 09/29/2014  . PFIZER SARS-COV-2 Vaccination 02/15/2020, 03/07/2020  . Pneumococcal Conjugate-13 02/25/2014  . Pneumococcal Polysaccharide-23 09/01/2007, 07/31/2011  . Td 09/01/2007    Diabetes Related Lab Review: Lab Results  Component Value Date   HGBA1C 7.5 (A) 04/26/2020   HGBA1C 6.4 (A) 11/12/2019   HGBA1C 6.7 (H) 08/13/2019    Lab Results  Component Value Date   MICROALBUR 21.4 (H) 09/16/2019   Lab Results  Component Value Date   CREATININE 0.96 08/13/2019   BUN 13 08/13/2019   NA 138 08/13/2019   K 4.1 08/13/2019   CL 103 08/13/2019   CO2 26 08/13/2019   Lab Results  Component Value Date   CHOL 121 01/12/2019   CHOL 143 07/07/2018   CHOL 117 07/07/2017   Lab Results  Component Value Date   HDL 37.00 (L) 01/12/2019   HDL 36.70 (L) 07/07/2018   HDL 37.60 (L) 07/07/2017   Lab Results  Component Value Date   LDLCALC 68 01/12/2019   LDLCALC 71 07/07/2018   LDLCALC 63 07/07/2017   Lab Results  Component Value Date   TRIG 80.0 01/12/2019   TRIG 177.0 (H) 07/07/2018   TRIG 81.0  07/07/2017   Lab Results  Component Value Date   CHOLHDL 3 01/12/2019   CHOLHDL 4 07/07/2018   CHOLHDL 3 07/07/2017   No results found for: LDLDIRECT The ASCVD Risk score Mikey Bussing DC Jr., et al., 2013) failed to calculate for the following reasons:   The patient has a prior MI or stroke diagnosis  BP Readings from Last 3 Encounters:  04/26/20 118/68  11/12/19 126/78  09/16/19 124/74   Wt Readings from Last 3 Encounters:  04/26/20 211 lb (95.7 kg)  11/12/19 203 lb (92.1 kg)  11/05/19 195 lb (88.5 kg)    Health Maintenance  Topic Date Due  . TETANUS/TDAP  08/31/2017  . INFLUENZA VACCINE  07/02/2020  .  URINE MICROALBUMIN  09/15/2020  . HEMOGLOBIN A1C  10/27/2020  . OPHTHALMOLOGY EXAM  12/27/2020  . FOOT EXAM  04/26/2021  . COLONOSCOPY  05/05/2024  . COVID-19 Vaccine  Completed  . Hepatitis C Screening  Completed  . PNA vac Low Risk Adult  Completed     Patient Active Problem List   Diagnosis Date Noted  . History of MI (myocardial infarction) 04/26/2020  . Hx of CABG 04/26/2020  . 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 Essential hypertension 10/05/2018  . Type 2 diabetes mellitus with complication, without long-term current use of insulin ([place]) 08/12/2016  . Hypothyroidism (acquired) 05/29/2009  . Hyperlipidemia associated with type 2 diabetes mellitus ([place]) 09/01/2007  . CAD (coronary artery disease), native coronary artery 09/01/2007  . Diverticulosis 07/13/2019  . GERD 09/01/2007  . Peptic ulcer 09/01/2007  . Former smoker 07/13/2019  . Testosterone deficiency 09/01/2007  . Brachial neuritis or radiculitis, right 02/20/2009   Health Maintenance  Topic Date Due  . TETANUS/TDAP  08/31/2017  . INFLUENZA VACCINE  07/02/2020  . URINE MICROALBUMIN  09/15/2020  . HEMOGLOBIN A1C  10/27/2020  . OPHTHALMOLOGY EXAM  12/27/2020  . FOOT EXAM  04/26/2021  . COLONOSCOPY  05/05/2024  . COVID-19 Vaccine  Completed  . Hepatitis C Screening  Completed  . PNA vac Low Risk Adult  Completed   Immunization History  Administered Date(s) Administered  . Fluad Quad(high Dose 65+) 09/16/2019  . Influenza Split 11/01/2011, 09/22/2012  . Influenza Whole 09/22/2009, 09/20/2010  . Influenza, High Dose Seasonal PF 10/03/2015, 11/11/2016, 10/13/2017, 11/20/2018  . Influenza,inj,Quad PF,6+ Mos 09/16/2013, 09/29/2014  . PFIZER SARS-COV-2 Vaccination 02/15/2020, 03/07/2020  . Pneumococcal Conjugate-13 02/25/2014  . Pneumococcal Polysaccharide-23 09/01/2007, 07/31/2011  . Td 09/01/2007   We updated and reviewed the patient's past history in detail and it is documented  below. Allergies: Patient has No Known Allergies. Past Medical History  has a past medical history of CAD (coronary artery disease), s/p MI - s/p PCI to RCA 2001;  [place] 7/13: 3v CAD, good LVF - >  s/p CABG 7/13 (L-LAD, S-OM1/dCFX, S-dRCA), Diabetes mellitus type 2, controlled ([place]), Diverticulosis, Early cataracts, bilateral, GERD (09/01/2007), H/O hiatal hernia, HLD (hyperlipidemia), on statin, Hypothyroidism. on Synthroid, PUD (peptic ulcer disease), Sleep apnea, patient denies, Sleep behavior disorder, REM (09/16/2019), and Testosterone deficiency. Past Surgical History Patient  has a past surgical history that includes Cardiac catheterization (2005; 06/01/2012); Tonsillectomy and adenoidectomy (1955); arm surgery [id number]); Colonoscopy (2005); Esophagogastroduodenoscopy; Coronary angioplasty with stent (2001); Coronary artery bypass graft (06/09/2012); and percutaneous coronary stent intervention (pci-s) (N/A, 06/01/2012). Social History Patient  reports that he quit smoking about 7 years ago. His smoking use included pipe and cigars. He has never used smokeless tobacco. He reports current alcohol use. He reports that  he does not use drugs. Family History family history includes Coronary artery disease (age of onset: 99) in his father; Memory loss in his father. Review of Systems: Constitutional: negative for fever or malaise Ophthalmic: negative for photophobia, double vision or loss of vision Cardiovascular: negative for chest pain, dyspnea on exertion, or new LE swelling Respiratory: negative for SOB or persistent cough Gastrointestinal: negative for abdominal pain, change in bowel habits or melena Genitourinary: negative for dysuria or gross hematuria Musculoskeletal: negative for new gait disturbance or muscular weakness. + new knee pain Integumentary: negative for new or persistent rashes Neurological: negative for TIA or stroke symptoms Psychiatric: negative for SI or  delusions Allergic/Immunologic: negative for hives  Patient Care Team    Relationship Specialty Notifications Start End  Leamon Arnt, MD PCP - General Family Medicine  09/16/19   Minus Breeding, MD PCP - Cardiology Cardiology Admissions 11/05/19   Grace Isaac, MD Consulting Physician Cardiothoracic Surgery  06/02/12   Allyn Kenner, MD  Dermatology  09/16/19   Minus Breeding, MD Consulting Physician Cardiology  09/16/19   Dohmeier, Asencion Partridge, MD Consulting Physician Neurology  04/26/20    Objective  Vitals: BP 118/68   Pulse 71   Temp 98.2 F (36.8 C) (Temporal)   Resp 16   Ht 5\' 8"  (1.727 m)   Wt 211 lb (95.7 kg)   SpO2 94%   BMI 32.08 kg/m  General:  Well developed, well nourished, no acute distress  Psych:  Alert and orientedx3,normal mood and affect HEENT:  Normocephalic, atraumatic, non-icteric sclera, PERRL,  supple neck without adenopathy, mass or thyromegaly Cardiovascular:  Normal S1, S2, RRR without gallop, rub or murmur, nondisplaced PMI, +1 distal pulses in bilateral lower extremities. Respiratory:  Good breath sounds bilaterally, CTAB with normal respiratory effort Gastrointestinal: normal bowel sounds, soft, non-tender, no noted masses. No HSM MSK: no deformities, contusions. Joints are without erythema or swelling. bilatearl knee exams are normal, Spine and CVA region are nontender Skin:  Warm, no rashes or suspicious lesions noted Neurologic:    Mental status is normal. CN 2-11 are normal. Gross motor and sensory exams are normal. Stable gait. No tremor GU: No inguinal hernias or adenopathy are appreciated bilaterally Diabetic Foot Exam: Appearance - no lesions, ulcers or calluses Skin - no sigificant pallor or erythema Monofilament testing - sensitive bilaterally in following locations:  Right - Great toe, medial, central, lateral ball and posterior foot intact  Left - Great toe, medial, central, lateral ball and posterior foot intact Pulses - +1 distally  bilaterally    Assessment  1. Annual physical exam   2. Type 2 diabetes mellitus with complication, without long-term current use of insulin ([place])   3. Essential hypertension, Rx Metoprolol   4. Hyperlipidemia associated with type 2 diabetes mellitus ([place])   5. Hypothyroidism (acquired)   6. Coronary artery disease involving native coronary artery of native heart without angina pectoris   7. History of MI (myocardial infarction)   8. Hx of CABG   9. Acute pain of both knees      Plan  Male Wellness Visit:  Age appropriate Health Maintenance and Prevention measures were discussed with patient. Included topics are cancer screening recommendations, ways to keep healthy (see AVS) including dietary and exercise recommendations, regular eye and dental care, use of seat belts, and avoidance of moderate alcohol use and tobacco use.   BMI: discussed patient's BMI and encouraged positive lifestyle modifications to help get to or maintain a target BMI.  HM needs and immunizations were addressed and ordered. See below for orders. See HM and immunization section for updates. Recommend shingrix, RX printed  Routine labs and screening tests ordered including cmp, cbc and lipids where appropriate.  Discussed recommendations regarding Vit D and calcium supplementation (see AVS)  Chronic disease f/u and/or acute problem visit: (deemed necessary to be done in addition to the wellness visit):  DM: worsening control. Education given. Start [place]. Educated on expectations, risks/benefits. Continue metformin. Check labs.   HTN is controlled on BB. No ace. farxiga for renal protection instead  HLD and low thyroid: recheck with labs today. Have been controlled.  Knee pain: suspect mild OA; tylenol, ice and voltaren gel. Check xrays.  Lower abd pain: no red flags. May be related to constipation. Return if worsens. H/o diverticulitis. No consistent sxs now  CAD: stable clinically. Will forward labs to  Dr. Percival Spanish.   Follow up: Return in about 3 months (around 07/27/2020) for follow up of diabetes and hypertension.   Commons side effects, risks, benefits, and alternatives for medications and treatment plan prescribed today were discussed, and the patient expressed understanding of the given instructions. Patient is instructed to call or message via MyChart if he/she has any questions or concerns regarding our treatment plan. No barriers to understanding were identified. We discussed Red Flag symptoms and signs in detail. Patient expressed understanding regarding what to do in case of urgent or emergency type symptoms.   Medication list was reconciled, printed and provided to the patient in AVS. Patient instructions and summary information was reviewed with the patient as documented in the AVS. This note was prepared with assistance of Dragon voice recognition software. Occasional wrong-word or sound-a-like substitutions may have occurred due to the inherent limitations of voice recognition software  This visit occurred during the SARS-CoV-2 public health emergency.  Safety protocols were in place, including screening questions prior to the visit, additional usage of staff PPE, and extensive cleaning of exam room while observing appropriate contact time as indicated for disinfecting solutions.   Orders Placed This Encounter  Procedures  . DG Knee 1-2 Views Left  . DG Knee 1-2 Views Right  . CBC with Differential/Platelet  . Comprehensive metabolic panel  . Lipid panel  . TSH  . Microalbumin / creatinine urine ratio  . POCT HgB A1C   Meds ordered this encounter  Medications  . Zoster Vaccine Adjuvanted [facility]) injection    Sig: Inject 0.5 mLs into the muscle once for 1 dose. Please give 2nd dose 2-6 months after first dose    Dispense:  2 each    Refill:  0  . dapagliflozin propanediol ([name]) 10 MG TABS tablet    Sig: Take 1 tablet (10 mg total) by mouth daily.    Dispense:  90  tablet    Refill:  3

## 2020-04-26 NOTE — Addendum Note (Signed)
Addended by: Christiana Fuchs on: 04/26/2020 10:15 AM   Modules accepted: Orders

## 2020-04-27 ENCOUNTER — Encounter: Payer: Self-pay | Admitting: Family Medicine

## 2020-04-27 ENCOUNTER — Other Ambulatory Visit: Payer: Self-pay

## 2020-04-27 DIAGNOSIS — M170 Bilateral primary osteoarthritis of knee: Secondary | ICD-10-CM

## 2020-04-27 DIAGNOSIS — E039 Hypothyroidism, unspecified: Secondary | ICD-10-CM

## 2020-04-27 HISTORY — DX: Bilateral primary osteoarthritis of knee: M17.0

## 2020-04-27 MED ORDER — LEVOTHYROXINE SODIUM 112 MCG PO TABS
112.0000 ug | ORAL_TABLET | Freq: Every day | ORAL | 0 refills | Status: DC
Start: 2020-04-27 — End: 2020-07-20

## 2020-05-15 ENCOUNTER — Other Ambulatory Visit: Payer: Self-pay | Admitting: Family Medicine

## 2020-05-15 DIAGNOSIS — R69 Illness, unspecified: Secondary | ICD-10-CM | POA: Diagnosis not present

## 2020-05-15 DIAGNOSIS — E118 Type 2 diabetes mellitus with unspecified complications: Secondary | ICD-10-CM

## 2020-05-29 ENCOUNTER — Other Ambulatory Visit: Payer: Self-pay | Admitting: Family Medicine

## 2020-05-29 DIAGNOSIS — E1169 Type 2 diabetes mellitus with other specified complication: Secondary | ICD-10-CM

## 2020-05-29 DIAGNOSIS — E785 Hyperlipidemia, unspecified: Secondary | ICD-10-CM

## 2020-06-19 ENCOUNTER — Ambulatory Visit (INDEPENDENT_AMBULATORY_CARE_PROVIDER_SITE_OTHER): Payer: Medicare HMO | Admitting: Family Medicine

## 2020-06-19 ENCOUNTER — Encounter: Payer: Self-pay | Admitting: Family Medicine

## 2020-06-19 ENCOUNTER — Other Ambulatory Visit: Payer: Self-pay

## 2020-06-19 VITALS — BP 116/70 | HR 75 | Temp 97.3°F | Ht 68.0 in | Wt 201.4 lb

## 2020-06-19 DIAGNOSIS — E039 Hypothyroidism, unspecified: Secondary | ICD-10-CM

## 2020-06-19 DIAGNOSIS — I10 Essential (primary) hypertension: Secondary | ICD-10-CM

## 2020-06-19 DIAGNOSIS — E118 Type 2 diabetes mellitus with unspecified complications: Secondary | ICD-10-CM | POA: Diagnosis not present

## 2020-06-19 DIAGNOSIS — M170 Bilateral primary osteoarthritis of knee: Secondary | ICD-10-CM | POA: Diagnosis not present

## 2020-06-19 LAB — TSH: TSH: 1.88 u[IU]/mL (ref 0.35–4.50)

## 2020-06-19 NOTE — Patient Instructions (Signed)
Please return in 3 months for diabetes follow up  I will release your lab results to you on your MyChart account with further instructions. Please reply with any questions.   Please make an appointment with Dr. Nevada Crane to check your scalp lesion.   If you have any questions or concerns, please don't hesitate to send me a message via MyChart or call the office at [phone]. Thank you for visiting with Adrian Gamble today! It's our pleasure caring for you.

## 2020-06-19 NOTE — Progress Notes (Signed)
Subjective  CC:  Chief Complaint  Patient presents with  . Hypothyroidism    Taking Levothyroxine 112 mcg. no side effects    HPI: Adrian Gamble is a 74 y.o. male who presents to the office today to address the problems listed above in the chief complaint.  Hypothyroidism:  recheck labs today and adjust meds accordingly. Pt is clinicall euthyroid now.  He does admit that he had some fatigue when he was on the lower dose of thyroid medication.  We switched from 100 mcg daily to 112 mcg daily back in May.  He denies palpitations or lower extremity edema.   Lab Results  Component Value Date   TSH 8.41 (H) 04/26/2020    Diabetes: Restart [name] at last visit due to an elevated A1c.  He is tolerating this well.  He denies any adverse effects.  Has not noticed any symptoms or effects from the medication. Lab Results  Component Value Date   HGBA1C 7.5 (A) 04/26/2020    Bilateral knee arthritis: We reviewed his x-rays taken in May.  He seems to be doing well.  He reports that he has been doing stiffness and pain upon the first 5-10 steps in the morning but after that he does fairly well.  He occasionally uses the topical Voltaren gel.  Does not need to take any oral medications for pain.  Symptoms are not worsening.  Hypertension: Blood pressure remains well controlled on medications.  He denies chest pain.  He does have a history of coronary disease.   Assessment  1. Hypothyroidism (acquired)   2. Bilateral primary osteoarthritis of knee   3. Essential hypertension   4. Type 2 diabetes mellitus with complication, without long-term current use of insulin (HCC)      Plan   Hypothyroidism: Recheck TSH on increased dose of levothyroxine.  Adjust dose if needed.  Knee arthritis: Tolerating well.  No changes noted.  Hypertension remains well controlled  Type 2 diabetes: Tolerating [name].  Will return in 2 to 3 months for recheck of A1c to see if he is at goal.  Continue diabetic  diet.   Follow up: Recheck diabetes in 3 months  No orders of the defined types were placed in this encounter.  No orders of the defined types were placed in this encounter.      I reviewed the patients updated PMH, FH, and SocHx.    Patient Active Problem List   Diagnosis Date Noted  . History of MI (myocardial infarction) 04/26/2020    Priority: High  . Hx of CABG 04/26/2020    Priority: High  . 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 Essential hypertension 10/05/2018    Priority: High  . Type 2 diabetes mellitus with complication, without long-term current use of insulin ([place]) 08/12/2016    Priority: High  . Hypothyroidism (acquired) 05/29/2009    Priority: High  . Hyperlipidemia associated with type 2 diabetes mellitus ([place]) 09/01/2007    Priority: High  . CAD (coronary artery disease), native coronary artery 09/01/2007    Priority: High  . Bilateral primary osteoarthritis of knee 04/27/2020    Priority: Medium  . Diverticulosis 07/13/2019    Priority: Medium  . GERD 09/01/2007    Priority: Medium  . Peptic ulcer 09/01/2007    Priority: Medium  . Former smoker 07/13/2019    Priority: Low  . Testosterone deficiency 09/01/2007    Priority: Low  . Brachial neuritis or radiculitis, right 02/20/2009  Current Meds  Medication Sig  . aspirin EC 81 MG tablet Take 81 mg by mouth daily.  . dapagliflozin propanediol ([name]) 10 MG TABS tablet Take 1 tablet (10 mg total) by mouth daily.  Marland Kitchen levothyroxine (SYNTHROID) 112 MCG tablet Take 1 tablet (112 mcg total) by mouth daily.  Marland Kitchen MELATONIN ER PO Take by mouth.  . metFORMIN (GLUCOPHAGE-XR) 500 MG 24 hr tablet Take 1 tablet (500 mg total) by mouth daily with breakfast.  . metoprolol tartrate (LOPRESSOR) 25 MG tablet Take 1/2 (one-half) tablet by mouth twice daily  . ONETOUCH VERIO test strip CHECK BLOOD SUGAR 2 TO 4 TIMES DAILY AS DIRECTED  . rosuvastatin (CRESTOR) 40 MG tablet Take 1 tablet by mouth  once daily  . vitamin B-12 (CYANOCOBALAMIN) 100 MCG tablet Take 100 mcg by mouth daily.    Allergies: Patient has No Known Allergies. Family History: Patient family history includes Coronary artery disease (age of onset: 75) in his father; Memory loss in his father. Social History:  Patient  reports that he quit smoking about 8 years ago. His smoking use included pipe and cigars. He has never used smokeless tobacco. He reports current alcohol use. He reports that he does not use drugs.  Review of Systems: Constitutional: Negative for fever malaise or anorexia Cardiovascular: negative for chest pain Respiratory: negative for SOB or persistent cough Gastrointestinal: negative for abdominal pain  Objective  Vitals: BP 116/70 (BP Location: Right Arm, Patient Position: Sitting, Cuff Size: Normal)   Pulse 75   Temp (!) 97.3 F (36.3 C) (Temporal)   Ht 5\' 8"  (1.727 m)   Wt 201 lb 6.4 oz (91.4 kg)   SpO2 93%   BMI 30.62 kg/m  General: no acute distress , A&Ox3 HEENT: PEERL, no proptosis or lid lag, conjunctiva normal, Oropharynx moist,neck is supple without goiter or thyromegaly or thyroid nodules Cardiovascular:  RRR without murmur or gallop. No peripheral edema Respiratory:  Good breath sounds bilaterally, CTAB with normal respiratory effort Skin:  Warm, no rashes Neuro: no tremor     Commons side effects, risks, benefits, and alternatives for medications and treatment plan prescribed today were discussed, and the patient expressed understanding of the given instructions. Patient is instructed to call or message via MyChart if he/she has any questions or concerns regarding our treatment plan. No barriers to understanding were identified. We discussed Red Flag symptoms and signs in detail. Patient expressed understanding regarding what to do in case of urgent or emergency type symptoms.   Medication list was reconciled, printed and provided to the patient in AVS. Patient instructions  and summary information was reviewed with the patient as documented in the AVS. This note was prepared with assistance of Dragon voice recognition software. Occasional wrong-word or sound-a-like substitutions may have occurred due to the inherent limitations of voice recognition software

## 2020-06-23 DIAGNOSIS — L218 Other seborrheic dermatitis: Secondary | ICD-10-CM | POA: Diagnosis not present

## 2020-06-23 DIAGNOSIS — X32XXXD Exposure to sunlight, subsequent encounter: Secondary | ICD-10-CM | POA: Diagnosis not present

## 2020-06-23 DIAGNOSIS — S0003XA Contusion of scalp, initial encounter: Secondary | ICD-10-CM | POA: Diagnosis not present

## 2020-06-23 DIAGNOSIS — L570 Actinic keratosis: Secondary | ICD-10-CM | POA: Diagnosis not present

## 2020-07-07 DIAGNOSIS — S0003XD Contusion of scalp, subsequent encounter: Secondary | ICD-10-CM | POA: Diagnosis not present

## 2020-07-20 ENCOUNTER — Other Ambulatory Visit: Payer: Self-pay | Admitting: Family Medicine

## 2020-08-01 ENCOUNTER — Ambulatory Visit: Payer: Medicare HMO | Admitting: Family Medicine

## 2020-08-08 ENCOUNTER — Other Ambulatory Visit: Payer: Self-pay | Admitting: Family Medicine

## 2020-08-08 DIAGNOSIS — E118 Type 2 diabetes mellitus with unspecified complications: Secondary | ICD-10-CM

## 2020-08-15 ENCOUNTER — Other Ambulatory Visit: Payer: Self-pay | Admitting: Family Medicine

## 2020-08-15 DIAGNOSIS — E118 Type 2 diabetes mellitus with unspecified complications: Secondary | ICD-10-CM

## 2020-08-23 ENCOUNTER — Telehealth: Payer: Self-pay

## 2020-08-23 ENCOUNTER — Other Ambulatory Visit: Payer: Self-pay

## 2020-08-23 DIAGNOSIS — E118 Type 2 diabetes mellitus with unspecified complications: Secondary | ICD-10-CM

## 2020-08-23 MED ORDER — METFORMIN HCL ER 500 MG PO TB24: 500.0000 mg | ORAL_TABLET | Freq: Every day | ORAL | 3 refills | Status: DC

## 2020-08-23 NOTE — Telephone Encounter (Signed)
I am not sure why or who denied his metformin request. I will send in new script for that. Do you want him to restart [name]? Please advise

## 2020-08-23 NOTE — Telephone Encounter (Signed)
Patient aware to restart medication.

## 2020-08-23 NOTE — Telephone Encounter (Signed)
Patient states that he did experience a yeast infection located on his penis about 4 weeks after starting [place]. Cleared up after 5 days off medication.

## 2020-08-23 NOTE — Telephone Encounter (Signed)
Ok, this is not too uncommon.  Ok to continue medication.

## 2020-08-23 NOTE — Telephone Encounter (Signed)
Pt was unable to get his metformin. Pharmacy told him that the Dr. Isabelle Course it. He wants to know if this is because Dr. Jonni Sanger doesn't want him on it or for another reason.  Pt also wants to know if he should continue his [place]. He had an infection and quit taking it and the infection cleared up immediately.

## 2020-08-23 NOTE — Telephone Encounter (Signed)
Yes, please refill his metformin. I did not deny it.  Please clarify "infection": is he talking about a yeast infection (balanitis) or something else? Yes, I'd like him to be on it unless there is a reason he can't tolerate it.  Thanks.

## 2020-09-04 ENCOUNTER — Ambulatory Visit (INDEPENDENT_AMBULATORY_CARE_PROVIDER_SITE_OTHER): Payer: Medicare HMO

## 2020-09-04 DIAGNOSIS — Z0000 Encounter for general adult medical examination without abnormal findings: Secondary | ICD-10-CM

## 2020-09-04 DIAGNOSIS — R21 Rash and other nonspecific skin eruption: Secondary | ICD-10-CM

## 2020-09-04 HISTORY — DX: Rash and other nonspecific skin eruption: R21

## 2020-09-04 NOTE — Progress Notes (Signed)
Virtual Visit via Telephone Note  I connected with  Quenton Fetter on 09/04/20 at 11:00 AM EDT by telephone and verified that I am speaking with the correct person using two identifiers.  Medicare Annual Wellness visit completed telephonically due to Covid-19 pandemic.   Persons participating in this call: This Health Coach and this patient and wife Shelia  Location: Patient: Home Provider: Office   I discussed the limitations, risks, security and privacy concerns of performing an evaluation and management service by telephone and the availability of in person appointments. The patient expressed understanding and agreed to proceed.  Unable to perform video visit due to video visit attempted and failed and/or patient does not have video capability.   Some vital signs may be absent or patient reported.   Willette Brace, LPN    Subjective:   Abdiel Blackerby is a 74 y.o. male who presents for Medicare Annual/Subsequent preventive examination.  Review of Systems     Cardiac Risk Factors include: hypertension;diabetes mellitus;dyslipidemia;male gender;obesity (BMI >30kg/m2)     Objective:    There were no vitals filed for this visit. There is no height or weight on file to calculate BMI.  Advanced Directives 09/04/2020 08/12/2019 04/21/2014 06/10/2012 06/05/2012 06/02/2012 06/01/2012  Does Patient Have a Medical Advance Directive? Yes No Patient does not have advance directive Patient has advance directive, copy not in chart Patient does not have advance directive;Patient would like information Patient does not have advance directive;Patient would not like information Patient does not have advance directive  Type of Advance Directive [place];Living will - - Living will;Healthcare Power of Attorney - - -  Copy of [place] in Chart? No - copy requested - - Copy requested from family - - -  Would patient like information on creating a medical advance directive? -  No - Patient declined - - Advance directive packet given - -  Pre-existing out of facility DNR order (yellow form or pink MOST form) - - - No No - No    Current Medications (verified) Outpatient Encounter Medications as of 09/04/2020  Medication Sig  . aspirin EC 81 MG tablet Take 81 mg by mouth daily.  . dapagliflozin propanediol ([name]) 10 MG TABS tablet Take 1 tablet (10 mg total) by mouth daily.  Marland Kitchen levothyroxine (SYNTHROID) 112 MCG tablet Take 1 tablet by mouth once daily  . MELATONIN ER PO Take by mouth.  . metFORMIN (GLUCOPHAGE-XR) 500 MG 24 hr tablet Take 1 tablet (500 mg total) by mouth daily with breakfast.  . metoprolol tartrate (LOPRESSOR) 25 MG tablet Take 1/2 (one-half) tablet by mouth twice daily  . ONETOUCH VERIO test strip CHECK BLOOD SUGAR 2 TO 4 TIMES DAILY AS DIRECTED  . rosuvastatin (CRESTOR) 40 MG tablet Take 1 tablet by mouth once daily  . vitamin B-12 (CYANOCOBALAMIN) 100 MCG tablet Take 100 mcg by mouth daily.  . [DISCONTINUED] clonazePAM (KLONOPIN) 0.5 MG tablet Use 1/2 tablet PRN at bedtime for REM sleep suppression. May take second half if needed. (Patient not taking: Reported on 06/19/2020)   No facility-administered encounter medications on file as of 09/04/2020.    Allergies (verified) Patient has no known allergies.   History: Past Medical History:  Diagnosis Date  . Bilateral primary osteoarthritis of knee 04/27/2020   xrays 04/2020, tricompartmental, worse medially  . CAD (coronary artery disease), s/p MI - s/p PCI to RCA 2001;  LHC 7/13: 3v CAD, good LVF - >  s/p CABG 7/13 (L-LAD, S-OM1/dCFX,  S-dRCA)   . Diabetes mellitus type 2, controlled ([place])   . Diverticulosis   . Early cataracts, bilateral   . GERD 09/01/2007  . H/O hiatal hernia   . HLD (hyperlipidemia), on statin   . Hypothyroidism. on Synthroid   . PUD (peptic ulcer disease)   . Rash 09/04/2020   rash on penis area  . Sleep apnea, patient denies   . Sleep behavior disorder, REM  09/16/2019   REM disorder  . Testosterone deficiency    Past Surgical History:  Procedure Laterality Date  . arm surgery  1957   d/t broken  . CARDIAC CATHETERIZATION  2005; 06/01/2012  . COLONOSCOPY  2005  . CORONARY ANGIOPLASTY WITH STENT PLACEMENT  2001   RCA-1 stent  . CORONARY ARTERY BYPASS GRAFT  06/09/2012   Procedure: CORONARY ARTERY BYPASS GRAFTING (CABG);  Surgeon: Grace Isaac, MD;  Location: [facility];  Service: Open Heart Surgery;  Laterality: N/A;  CABG x four;  using left internal mammary artery and right leg greater saphenous vein harvested endoscopically  . ESOPHAGOGASTRODUODENOSCOPY    . PERCUTANEOUS CORONARY STENT INTERVENTION (PCI-S) N/A 06/01/2012   Procedure: PERCUTANEOUS CORONARY STENT INTERVENTION (PCI-S);  Surgeon: Burnell Blanks, MD;  Location[facility] C[facility] LAB;  Service: Cardiovascular;  Laterality: N/A;  . Ramos   at age 36   Family History  Problem Relation Age of Onset  . Memory loss Father   . Coronary artery disease Father 63       cabg  . Colon cancer Neg Hx    Social History   Socioeconomic History  . Marital status: Married    Spouse name: Not on file  . Number of children: Not on file  . Years of education: Not on file  . Highest education level: Not on file  Occupational History  . Occupation: Retired  Tobacco Use  . Smoking status: Former Smoker    Types: Pipe, Cigars    Quit date: 06/01/2012    Years since quitting: 8.2  . Smokeless tobacco: Never Used  . Tobacco comment: patient states that he is not somking the pipe or cigars any more  Substance and Sexual Activity  . Alcohol use: Yes    Comment: rare  . Drug use: No  . Sexual activity: Yes  Other Topics Concern  . Not on file  Social History Narrative  . Not on file   Social Determinants of Health   Financial Resource Strain: Low Risk   . Difficulty of Paying Living Expenses: Not hard at all  Food Insecurity: No Food Insecurity  . Worried  About Charity fundraiser in the Last Year: Never true  . Ran Out of Food in the Last Year: Never true  Transportation Needs: No Transportation Needs  . Lack of Transportation (Medical): No  . Lack of Transportation (Non-Medical): No  Physical Activity: Inactive  . Days of Exercise per Week: 0 days  . Minutes of Exercise per Session: 0 min  Stress: No Stress Concern Present  . Feeling of Stress : Not at all  Social Connections: Moderately Integrated  . Frequency of Communication with Friends and Family: More than three times a week  . Frequency of Social Gatherings with Friends and Family: Twice a week  . Attends Religious Services: More than 4 times per year  . Active Member of Clubs or Organizations: No  . Attends Archivist Meetings: Never  . Marital Status: Married    Tobacco  Counseling Counseling given: Not Answered Comment: patient states that he is not somking the pipe or cigars any more   Clinical Intake:  Pre-visit preparation completed: Yes  Pain : No/denies pain     BMI - recorded: 30.63 Nutritional Status: BMI > 30  Obese Nutritional Risks: None Diabetes: Yes CBG done?: No Did pt. bring in CBG monitor from home?: No  How often do you need to have someone help you when you read instructions, pamphlets, or other written materials from your doctor or pharmacy?: 1 - Never  Diabetic?Yes  Interpreter Needed?: No  Information entered by :: Charlott Rakes, LPN   Activities of Daily Living In your present state of health, do you have any difficulty performing the following activities: 09/04/2020 04/26/2020  Hearing? N N  Vision? N N  Difficulty concentrating or making decisions? Y N  Comment short term off at times -  Walking or climbing stairs? N N  Dressing or bathing? N N  Doing errands, shopping? N N  Preparing Food and eating ? N -  Using the Toilet? N -  In the past six months, have you accidently leaked urine? Y -  Comment slight leakage  at time, wears depends for long occassions -  Do you have problems with loss of bowel control? N -  Managing your Medications? N -  Managing your Finances? N -  Housekeeping or managing your Housekeeping? N -  Some recent data might be hidden    Patient Care Team: Leamon Arnt, MD as PCP - General (Family Medicine) Minus Breeding, MD as PCP - Cardiology (Cardiology) Grace Isaac, MD as Consulting Physician (Cardiothoracic Surgery) Allyn Kenner, MD (Dermatology) Minus Breeding, MD as Consulting Physician (Cardiology) Dohmeier, Asencion Partridge, MD as Consulting Physician (Neurology)  Indicate any recent Medical Services you may have received from other than [facility] providers in the past year (date may be approximate).     Assessment:   This is a routine wellness examination for Emanuell.  Hearing/Vision screen  Hearing Screening   125Hz  250Hz  500Hz  1000Hz  2000Hz  3000Hz  4000Hz  6000Hz  8000Hz   Right ear:           Left ear:           Comments: Pt denies any hearing issues at this time  Vision Screening Comments: Follows up with Dr Luretha Rued annually for eye exams  Dietary issues and exercise activities discussed: Current Exercise Habits: The patient does not participate in regular exercise at present  Goals    . Patient Stated     Not at this time      Depression Screen PHQ 2/9 Scores 09/04/2020 04/26/2020 08/12/2019 01/19/2019 01/12/2019 07/07/2018 08/12/2016  PHQ - 2 Score 0 1 0 0 0 0 0  PHQ- 9 Score - - - 2 3 0 -    Fall Risk Fall Risk  09/04/2020 04/26/2020 08/24/2019 08/12/2019 01/12/2019  Falls in the past year? 0 0 0 0 0  Number falls in past yr: 0 0 - 0 0  Injury with Fall? 0 0 - 0 0  Risk for fall due to : Impaired vision;Impaired mobility - - - -  Follow up - - - Falls evaluation completed;Education provided -    Any stairs in or around the home? Yes  If so, are there any without handrails? No  Home free of loose throw rugs in walkways, pet beds, electrical cords, etc? Yes    Adequate lighting in your home to reduce risk of falls? Yes  ASSISTIVE DEVICES UTILIZED TO PREVENT FALLS:  Life alert? No  Use of a cane, walker or w/c? No  Grab bars in the bathroom? Yes  Shower chair or bench in shower? No  Elevated toilet seat or a handicapped toilet? Yes   TIMED UP AND GO:  Was the test performed? No .     Cognitive Function:     6CIT Screen 09/04/2020  What Year? 0 points  What month? 0 points  Count back from 20 0 points  Months in reverse 0 points  Repeat phrase 0 points    Immunizations Immunization History  Administered Date(s) Administered  . Fluad Quad(high Dose 65+) 09/16/2019  . Influenza Split 11/01/2011, 09/22/2012  . Influenza Whole 09/22/2009, 09/20/2010  . Influenza, High Dose Seasonal PF 10/03/2015, 11/11/2016, 10/13/2017, 11/20/2018  . Influenza,inj,Quad PF,6+ Mos 09/16/2013, 09/29/2014  . PFIZER SARS-COV-2 Vaccination 02/15/2020, 03/07/2020  . Pneumococcal Conjugate-13 02/25/2014  . Pneumococcal Polysaccharide-23 09/01/2007, 07/31/2011  . Td 09/01/2007    TDAP status: Due, Education has been provided regarding the importance of this vaccine. Advised may receive this vaccine at local pharmacy or Health Dept. Aware to provide a copy of the vaccination record if obtained from local pharmacy or Health Dept. Verbalized acceptance and understanding. Flu Vaccine status: Declined, Education has been provided regarding the importance of this vaccine but patient still declined. Advised may receive this vaccine at local pharmacy or Health Dept. Aware to provide a copy of the vaccination record if obtained from local pharmacy or Health Dept. Verbalized acceptance and understanding. Pneumococcal vaccine status: Up to date Covid-19 vaccine status: Completed vaccines  Qualifies for Shingles Vaccine? Yes   Zostavax completed Yes   Shingrix Completed?: Yes  Screening Tests Health Maintenance  Topic Date Due  . TETANUS/TDAP  08/31/2017  .  INFLUENZA VACCINE  07/02/2020  . HEMOGLOBIN A1C  10/27/2020  . OPHTHALMOLOGY EXAM  12/27/2020  . FOOT EXAM  04/26/2021  . URINE MICROALBUMIN  04/26/2021  . COLONOSCOPY  05/05/2024  . COVID-19 Vaccine  Completed  . Hepatitis C Screening  Completed  . PNA vac Low Risk Adult  Completed    Health Maintenance  Health Maintenance Due  Topic Date Due  . TETANUS/TDAP  08/31/2017  . INFLUENZA VACCINE  07/02/2020    Colorectal cancer screening: Completed 05/05/14. Repeat every 10 years   Additional Screening:  Hepatitis C Screening:  Completed 07/07/18  Vision Screening: Recommended annual ophthalmology exams for early detection of glaucoma and other disorders of the eye. Is the patient up to date with their annual eye exam?  Yes  Who is the provider or what is the name of the office in which the patient attends annual eye exams? Dr Luretha Rued   Dental Screening: Recommended annual dental exams for proper oral hygiene  Community Resource Referral / Chronic Care Management: CRR required this visit?  No   CCM required this visit?  No      Plan:     I have personally reviewed and noted the following in the patient's chart:   . Medical and social history . Use of alcohol, tobacco or illicit drugs  . Current medications and supplements . Functional ability and status . Nutritional status . Physical activity . Advanced directives . List of other physicians . Hospitalizations, surgeries, and ER visits in previous 12 months . Vitals . Screenings to include cognitive, depression, and falls . Referrals and appointments  In addition, I have reviewed and discussed with patient certain preventive protocols, quality metrics,  and best practice recommendations. A written personalized care plan for preventive services as well as general preventive health recommendations were provided to patient.     Willette Brace, LPN   30/07/6577   Nurse Notes: Pt will come in to see Dr Yong Channel,  09/05/20 for Concerns of a rash possibly due to medication reaction per pt.

## 2020-09-04 NOTE — Patient Instructions (Signed)
[name] , Thank you for taking time to come for your Medicare Wellness Visit. I appreciate your ongoing commitment to your health goals. Please review the following plan we discussed and let me know if I can assist you in the future.   Screening recommendations/referrals: Colonoscopy: Done 05/05/14 Recommended yearly ophthalmology/optometry visit for glaucoma screening and checkup Recommended yearly dental visit for hygiene and checkup  Vaccinations: Influenza vaccine: Due and discussed Pneumococcal vaccine: Up to date Tdap vaccine: Due and discussed Shingles vaccine: Pt stated had 1st vaccine at [place] will confirm date  Covid-19: Completed 3/16 & 03/07/20  Advanced directives: Please bring a copy of your health care power of attorney and living will to the office at your convenience.  Conditions/risks identified: None at this time  Next appointment: Follow up in one year for your annual wellness visit.   Preventive Care 25 Years and Older, Male Preventive care refers to lifestyle choices and visits with your health care provider that can promote health and wellness. What does preventive care include?  A yearly physical exam. This is also called an annual well check.  Dental exams once or twice a year.  Routine eye exams. Ask your health care provider how often you should have your eyes checked.  Personal lifestyle choices, including:  Daily care of your teeth and gums.  Regular physical activity.  Eating a healthy diet.  Avoiding tobacco and drug use.  Limiting alcohol use.  Practicing safe sex.  Taking low doses of aspirin every day.  Taking vitamin and mineral supplements as recommended by your health care provider. What happens during an annual well check? The services and screenings done by your health care provider during your annual well check will depend on your age, overall health, lifestyle risk factors, and family history of disease. Counseling  Your  health care provider may ask you questions about your:  Alcohol use.  Tobacco use.  Drug use.  Emotional well-being.  Home and relationship well-being.  Sexual activity.  Eating habits.  History of falls.  Memory and ability to understand (cognition).  Work and work Statistician. Screening  You may have the following tests or measurements:  Height, weight, and BMI.  Blood pressure.  Lipid and cholesterol levels. These may be checked every 5 years, or more frequently if you are over 34 years old.  Skin check.  Lung cancer screening. You may have this screening every year starting at age 27 if you have a 30-pack-year history of smoking and currently smoke or have quit within the past 15 years.  Fecal occult blood test (FOBT) of the stool. You may have this test every year starting at age 31.  Flexible sigmoidoscopy or colonoscopy. You may have a sigmoidoscopy every 5 years or a colonoscopy every 10 years starting at age 13.  Prostate cancer screening. Recommendations will vary depending on your family history and other risks.  Hepatitis C blood test.  Hepatitis B blood test.  Sexually transmitted disease (STD) testing.  Diabetes screening. This is done by checking your blood sugar (glucose) after you have not eaten for a while (fasting). You may have this done every 1-3 years.  Abdominal aortic aneurysm (AAA) screening. You may need this if you are a current or former smoker.  Osteoporosis. You may be screened starting at age 64 if you are at high risk. Talk with your health care provider about your test results, treatment options, and if necessary, the need for more tests. Vaccines  Your health  care provider may recommend certain vaccines, such as:  Influenza vaccine. This is recommended every year.  Tetanus, diphtheria, and acellular pertussis (Tdap, Td) vaccine. You may need a Td booster every 10 years.  Zoster vaccine. You may need this after age  81.  Pneumococcal 13-valent conjugate (PCV13) vaccine. One dose is recommended after age 1.  Pneumococcal polysaccharide (PPSV23) vaccine. One dose is recommended after age 45. Talk to your health care provider about which screenings and vaccines you need and how often you need them. This information is not intended to replace advice given to you by your health care provider. Make sure you discuss any questions you have with your health care provider. Document Released: 12/15/2015 Document Revised: 08/07/2016 Document Reviewed: 09/19/2015 Elsevier Interactive Patient Education  2017 [place] Prevention in the Home Falls can cause injuries. They can happen to people of all ages. There are many things you can do to make your home safe and to help prevent falls. What can I do on the outside of my home?  Regularly fix the edges of walkways and driveways and fix any cracks.  Remove anything that might make you trip as you walk through a door, such as a raised step or threshold.  Trim any bushes or trees on the path to your home.  Use bright outdoor lighting.  Clear any walking paths of anything that might make someone trip, such as rocks or tools.  Regularly check to see if handrails are loose or broken. Make sure that both sides of any steps have handrails.  Any raised decks and porches should have guardrails on the edges.  Have any leaves, snow, or ice cleared regularly.  Use sand or salt on walking paths during winter.  Clean up any spills in your garage right away. This includes oil or grease spills. What can I do in the bathroom?  Use night lights.  Install grab bars by the toilet and in the tub and shower. Do not use towel bars as grab bars.  Use non-skid mats or decals in the tub or shower.  If you need to sit down in the shower, use a plastic, non-slip stool.  Keep the floor dry. Clean up any water that spills on the floor as soon as it happens.  Remove  soap buildup in the tub or shower regularly.  Attach bath mats securely with double-sided non-slip rug tape.  Do not have throw rugs and other things on the floor that can make you trip. What can I do in the bedroom?  Use night lights.  Make sure that you have a light by your bed that is easy to reach.  Do not use any sheets or blankets that are too big for your bed. They should not hang down onto the floor.  Have a firm chair that has side arms. You can use this for support while you get dressed.  Do not have throw rugs and other things on the floor that can make you trip. What can I do in the kitchen?  Clean up any spills right away.  Avoid walking on wet floors.  Keep items that you use a lot in easy-to-reach places.  If you need to reach something above you, use a strong step stool that has a grab bar.  Keep electrical cords out of the way.  Do not use floor polish or wax that makes floors slippery. If you must use wax, use non-skid floor wax.  Do not have  throw rugs and other things on the floor that can make you trip. What can I do with my stairs?  Do not leave any items on the stairs.  Make sure that there are handrails on both sides of the stairs and use them. Fix handrails that are broken or loose. Make sure that handrails are as long as the stairways.  Check any carpeting to make sure that it is firmly attached to the stairs. Fix any carpet that is loose or worn.  Avoid having throw rugs at the top or bottom of the stairs. If you do have throw rugs, attach them to the floor with carpet tape.  Make sure that you have a light switch at the top of the stairs and the bottom of the stairs. If you do not have them, ask someone to add them for you. What else can I do to help prevent falls?  Wear shoes that:  Do not have high heels.  Have rubber bottoms.  Are comfortable and fit you well.  Are closed at the toe. Do not wear sandals.  If you use a  stepladder:  Make sure that it is fully opened. Do not climb a closed stepladder.  Make sure that both sides of the stepladder are locked into place.  Ask someone to hold it for you, if possible.  Clearly mark and make sure that you can see:  Any grab bars or handrails.  First and last steps.  Where the edge of each step is.  Use tools that help you move around (mobility aids) if they are needed. These include:  Canes.  Walkers.  Scooters.  Crutches.  Turn on the lights when you go into a dark area. Replace any light bulbs as soon as they burn out.  Set up your furniture so you have a clear path. Avoid moving your furniture around.  If any of your floors are uneven, fix them.  If there are any pets around you, be aware of where they are.  Review your medicines with your doctor. Some medicines can make you feel dizzy. This can increase your chance of falling. Ask your doctor what other things that you can do to help prevent falls. This information is not intended to replace advice given to you by your health care provider. Make sure you discuss any questions you have with your health care provider. Document Released: 09/14/2009 Document Revised: 04/25/2016 Document Reviewed: 12/23/2014 Elsevier Interactive Patient Education  2017 [employer].

## 2020-09-04 NOTE — Patient Instructions (Addendum)
Health Maintenance Due  Topic Date Due  . INFLUENZA VACCINE In office flu shot high dose 07/02/2020   This looks like a yeast infection on penis  Use salt water cleanses twice a day, then dry off well and can apply cream twice a day clotrimazole for a minimum 1 week and up to 3 weeks.   Lets hold on farxiga for now since you are seeing Dr. Jonni Sanger so soon and then can decide on next steps.

## 2020-09-04 NOTE — Progress Notes (Signed)
Phone [phone] In person visit   Subjective:   Carlee Tesfaye is a 74 y.o. year old very pleasant male patient who presents for/with See problem oriented charting Chief Complaint  Patient presents with  . Rash   This visit occurred during the SARS-CoV-2 public health emergency.  Safety protocols were in place, including screening questions prior to the visit, additional usage of staff PPE, and extensive cleaning of exam room while observing appropriate contact time as indicated for disinfecting solutions.   Past Medical History-  Patient Active Problem List   Diagnosis Date Noted  . Bilateral primary osteoarthritis of knee 04/27/2020  . History of MI (myocardial infarction) 04/26/2020  . Hx of CABG 04/26/2020  . 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 Diverticulosis 07/13/2019  . Former smoker 07/13/2019  . Essential hypertension 10/05/2018  . Type 2 diabetes mellitus with complication, without long-term current use of insulin ([place]) 08/12/2016  . Hypothyroidism (acquired) 05/29/2009  . 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 Testosterone deficiency 09/01/2007  . Hyperlipidemia associated with type 2 diabetes mellitus ([place]) 09/01/2007  . CAD (coronary artery disease), native coronary artery 09/01/2007  . GERD 09/01/2007  . Peptic ulcer 09/01/2007    Medications- reviewed and updated Current Outpatient Medications  Medication Sig Dispense Refill  . aspirin EC 81 MG tablet Take 81 mg by mouth daily.    . dapagliflozin propanediol ([name]) 10 MG TABS tablet Take 1 tablet (10 mg total) by mouth daily. 90 tablet 3  . levothyroxine (SYNTHROID) 112 MCG tablet Take 1 tablet by mouth once daily 90 tablet 0  . MELATONIN ER PO Take by mouth.    . metFORMIN (GLUCOPHAGE-XR) 500 MG 24 hr tablet Take 1 tablet (500 mg total) by mouth daily with breakfast. 90 tablet 3  . metoprolol tartrate (LOPRESSOR) 25 MG tablet Take 1/2 (one-half) tablet by mouth twice daily 90 tablet 0    . ONETOUCH VERIO test strip CHECK BLOOD SUGAR 2 TO 4 TIMES DAILY AS DIRECTED 100 each 0  . rosuvastatin (CRESTOR) 40 MG tablet Take 1 tablet by mouth once daily 90 tablet 0  . Scar Treatment Products [facility]) GEL Apply 1 application topically daily.    . vitamin B-12 (CYANOCOBALAMIN) 100 MCG tablet Take 100 mcg by mouth daily.    . clotrimazole (LOTRIMIN) 1 % cream Apply 1 application topically 2 (two) times daily. For 1 to 3 weeks 30 g 1   No current facility-administered medications for this visit.     Objective:  BP 118/70   Pulse 72   Temp 98 F (36.7 C) (Temporal)   Resp 18   Ht 5\' 8"  (1.727 m)   Wt 203 lb (92.1 kg)   SpO2 95%   BMI 30.87 kg/m  Gen: NAD, resting comfortably GU: Uncircumcised male.  When he retracts foreskin on the glans of the penis multiple white patches noted surrounded by erythema-patient mildly tender    Assessment and Plan  #Rash on penis S:Patient mentioned that the rash appeared a couple weeks ago while he was taking farxiga. He stopped the medication for 5 days and the rash went away. Also used nystatin. Got 90% better.   Restarted last Wednesday and Friday was significantly worse.  He stated that there is some pain associated with his rash. Also somewhat itchy.   Uses his body soap on penis daily as well- uses dove. Also rinses off with water after urination ROS-not ill appearing, no fever/chills. No new medications other than farxiga and  Mederma through dermatology. Not immunocompromised. No mucus membrane involvement.  A/P: Patient with candidal balanitis on exam.  Has improved when he took several days off of [name] and tried warm water cleanses/rinses after peeing.  Probability of this occurring  increased by [place] he would like to hold this medication for now until he sees Dr. Jonni Sanger back in 2 weeks.  We are going to do salt water cleanses twice a day and and treat with clotrimazole.  Salt water cleanses may be adequate as he is uncircumcised  but he is having some discomfort so wanted to be somewhat more aggressive  # Diabetes S: Medication:metformin 500mg  XR- when takes higher dose gets frequent BMs/diarrhea (does fine with 1 ).  On [name] but having rash issues in the groin as above Lab Results  Component Value Date   HGBA1C 7.5 (A) 04/26/2020   HGBA1C 6.4 (A) 11/12/2019   HGBA1C 6.7 (H) 08/13/2019   A/P: Poor control on last visit and started on [name].  He does not tolerate higher doses of Metformin due to GI issues.  Since he has very close follow-up with Dr. Jonni Sanger in 2 weeks we opted to hold [place] for now understanding this may increase sugar short-term  Recommended follow up: Keep follow-up in 2 weeks with Dr. Jonni Sanger unless has new or worsening symptoms Future Appointments  Date Time Provider [place]  09/20/2020 11:00 AM Leamon Arnt, MD [facility]  09/07/2021 11:45 AM [facility] COACH [facility]    Lab/Order associations:   ICD-10-CM   1. Candidal balanitis  B37.42   2. Type 2 diabetes mellitus with complication, without long-term current use of insulin (HCC)  E11.8     Meds ordered this encounter  Medications  . clotrimazole (LOTRIMIN) 1 % cream    Sig: Apply 1 application topically 2 (two) times daily. For 1 to 3 weeks    Dispense:  30 g    Refill:  1   Return precautions advised.  Garret Reddish, MD

## 2020-09-05 ENCOUNTER — Other Ambulatory Visit: Payer: Self-pay

## 2020-09-05 ENCOUNTER — Ambulatory Visit (INDEPENDENT_AMBULATORY_CARE_PROVIDER_SITE_OTHER): Payer: Medicare HMO | Admitting: Family Medicine

## 2020-09-05 ENCOUNTER — Encounter: Payer: Self-pay | Admitting: Family Medicine

## 2020-09-05 VITALS — BP 118/70 | HR 72 | Temp 98.0°F | Resp 18 | Ht 68.0 in | Wt 203.0 lb

## 2020-09-05 DIAGNOSIS — B3742 Candidal balanitis: Secondary | ICD-10-CM

## 2020-09-05 DIAGNOSIS — E118 Type 2 diabetes mellitus with unspecified complications: Secondary | ICD-10-CM | POA: Diagnosis not present

## 2020-09-05 DIAGNOSIS — Z23 Encounter for immunization: Secondary | ICD-10-CM

## 2020-09-05 MED ORDER — CLOTRIMAZOLE 1 % EX CREA: 1.0000 "application " | TOPICAL_CREAM | Freq: Two times a day (BID) | CUTANEOUS | 1 refills | Status: DC

## 2020-09-05 NOTE — Addendum Note (Signed)
Addended by: Thomes Cake on: 09/05/2020 10:54 AM   Modules accepted: Orders

## 2020-09-11 DIAGNOSIS — R69 Illness, unspecified: Secondary | ICD-10-CM | POA: Diagnosis not present

## 2020-09-11 DIAGNOSIS — Z8249 Family history of ischemic heart disease and other diseases of the circulatory system: Secondary | ICD-10-CM | POA: Diagnosis not present

## 2020-09-11 DIAGNOSIS — Z823 Family history of stroke: Secondary | ICD-10-CM | POA: Diagnosis not present

## 2020-09-11 DIAGNOSIS — N529 Male erectile dysfunction, unspecified: Secondary | ICD-10-CM | POA: Diagnosis not present

## 2020-09-11 DIAGNOSIS — Z7982 Long term (current) use of aspirin: Secondary | ICD-10-CM | POA: Diagnosis not present

## 2020-09-11 DIAGNOSIS — K219 Gastro-esophageal reflux disease without esophagitis: Secondary | ICD-10-CM | POA: Diagnosis not present

## 2020-09-11 DIAGNOSIS — E785 Hyperlipidemia, unspecified: Secondary | ICD-10-CM | POA: Diagnosis not present

## 2020-09-11 DIAGNOSIS — I10 Essential (primary) hypertension: Secondary | ICD-10-CM | POA: Diagnosis not present

## 2020-09-11 DIAGNOSIS — Z7984 Long term (current) use of oral hypoglycemic drugs: Secondary | ICD-10-CM | POA: Diagnosis not present

## 2020-09-11 DIAGNOSIS — E119 Type 2 diabetes mellitus without complications: Secondary | ICD-10-CM | POA: Diagnosis not present

## 2020-09-20 ENCOUNTER — Other Ambulatory Visit: Payer: Self-pay | Admitting: Family Medicine

## 2020-09-20 ENCOUNTER — Encounter: Payer: Self-pay | Admitting: Family Medicine

## 2020-09-20 ENCOUNTER — Other Ambulatory Visit: Payer: Self-pay

## 2020-09-20 ENCOUNTER — Ambulatory Visit (INDEPENDENT_AMBULATORY_CARE_PROVIDER_SITE_OTHER): Payer: Medicare HMO | Admitting: Family Medicine

## 2020-09-20 VITALS — BP 120/72 | HR 71 | Temp 97.9°F | Resp 16 | Ht 68.0 in | Wt 203.2 lb

## 2020-09-20 DIAGNOSIS — E118 Type 2 diabetes mellitus with unspecified complications: Secondary | ICD-10-CM | POA: Diagnosis not present

## 2020-09-20 DIAGNOSIS — E039 Hypothyroidism, unspecified: Secondary | ICD-10-CM

## 2020-09-20 DIAGNOSIS — I10 Essential (primary) hypertension: Secondary | ICD-10-CM | POA: Diagnosis not present

## 2020-09-20 DIAGNOSIS — E1169 Type 2 diabetes mellitus with other specified complication: Secondary | ICD-10-CM

## 2020-09-20 DIAGNOSIS — E785 Hyperlipidemia, unspecified: Secondary | ICD-10-CM

## 2020-09-20 LAB — POCT GLYCOSYLATED HEMOGLOBIN (HGB A1C): Hemoglobin A1C: 6.5 % — AB (ref 4.0–5.6)

## 2020-09-20 MED ORDER — FLUCONAZOLE 150 MG PO TABS: ORAL_TABLET | ORAL | 0 refills | Status: DC

## 2020-09-20 NOTE — Progress Notes (Signed)
Subjective   CC:  Chief Complaint  Patient presents with  . Diabetes    stopped [name] at last visit with Dr. Yong Channel - reoccuring yeast infections, sugars at home <130's   . Hyperlipidemia  . Hypertension    HPI: Adrian Gamble is a 74 y.o. male who presents to the office today for follow up of diabetes and problems listed above in the chief complaint.   Diabetes follow up: His diabetic control is reported as Improved.  However he had balanitis with [place].  I reviewed most recent office visit, treated with Chlortrimazole.  Now resolved.  Has not been on medications for the last 2 months.  Feels fine. He denies exertional CP or SOB or symptomatic hypoglycemia. He denies foot sores or paresthesias.   Hypertension: No chest pain or shortness of breath.  Continues on medication.  Not currently on any ACE inhibitor.  Mild microalbuminuria with normal albumin to creatinine ratio.  HLD and tolerating statin.  LDL 73  Low thyroid: TSH now normalized.  Telemetry medications.  No symptoms of low or high thyroid.   Wt Readings from Last 3 Encounters:  09/20/20 203 lb 3.2 oz (92.2 kg)  09/05/20 203 lb (92.1 kg)  06/19/20 201 lb 6.4 oz (91.4 kg)     BP Readings from Last 3 Encounters:  09/20/20 120/72  09/05/20 118/70  06/19/20 116/70    Assessment  1. Type 2 diabetes mellitus with complication, without long-term current use of insulin ([place])   2. Essential hypertension   3. Hypothyroidism (acquired)   4. Hyperlipidemia associated with type 2 diabetes mellitus ([place])      Plan   Diabetes is currently very well controlled.  Much improved on [name].  Had one yeast infection.  Education given.  Hydrate, keep area clean and dry, Diflucan if needed.  If becomes a recurrent problem will stop medication.  Continue diabetic diet.  Immunizations are up-to-date.  Hypertension remains well controlled.  Will monitor for elevated microalbumin creatinine ratio.  Thyroid and lipids seem well  controlled clinically and by lab work.  Follow up: 3 months for recheck. Orders Placed This Encounter  Procedures  . POCT HgB A1C   Meds ordered this encounter  Medications  . fluconazole (DIFLUCAN) 150 MG tablet    Sig: Take one tablet once for yeast infection as needed; may repeat in 3 days if symptoms persist    Dispense:  2 tablet    Refill:  0      Immunization History  Administered Date(s) Administered  . Fluad Quad(high Dose 65+) 09/16/2019, 09/05/2020  . Influenza Split 11/01/2011, 09/22/2012  . Influenza Whole 09/22/2009, 09/20/2010  . Influenza, High Dose Seasonal PF 10/03/2015, 11/11/2016, 10/13/2017, 11/20/2018  . Influenza,inj,Quad PF,6+ Mos 09/16/2013, 09/29/2014  . PFIZER SARS-COV-2 Vaccination 02/15/2020, 03/07/2020  . Pneumococcal Conjugate-13 02/25/2014  . Pneumococcal Polysaccharide-23 09/01/2007, 07/31/2011  . Td 09/01/2007  . Zoster Recombinat (Shingrix) 08/10/2020    Diabetes Related Lab Review: Lab Results  Component Value Date   HGBA1C 6.5 (A) 09/20/2020   HGBA1C 7.5 (A) 04/26/2020   HGBA1C 6.4 (A) 11/12/2019    Lab Results  Component Value Date   MICROALBUR 15.8 (H) 04/26/2020   Lab Results  Component Value Date   CREATININE 1.05 04/26/2020   BUN 13 04/26/2020   NA 141 04/26/2020   K 3.9 04/26/2020   CL 106 04/26/2020   CO2 27 04/26/2020   Lab Results  Component Value Date   CHOL 131 04/26/2020   CHOL  121 01/12/2019   CHOL 143 07/07/2018   Lab Results  Component Value Date   HDL 37.90 (L) 04/26/2020   HDL 37.00 (L) 01/12/2019   HDL 36.70 (L) 07/07/2018   Lab Results  Component Value Date   LDLCALC 73 04/26/2020   LDLCALC 68 01/12/2019   LDLCALC 71 07/07/2018   Lab Results  Component Value Date   TRIG 100.0 04/26/2020   TRIG 80.0 01/12/2019   TRIG 177.0 (H) 07/07/2018   Lab Results  Component Value Date   CHOLHDL 3 04/26/2020   CHOLHDL 3 01/12/2019   CHOLHDL 4 07/07/2018   No results found for: LDLDIRECT The  ASCVD Risk score Mikey Bussing DC Jr., et al., 2013) failed to calculate for the following reasons:   The patient has a prior MI or stroke diagnosis I have reviewed the [place], Fam and Soc history. Patient Active Problem List   Diagnosis Date Noted  . History of MI (myocardial infarction) 04/26/2020    Priority: High    2001   . Hx of CABG 04/26/2020    Priority: High    2013   . Sleep behavior disorder, REM 09/16/2019    Priority: High    Neurology evaluating 08/2019; ? 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    . Essential hypertension 10/05/2018    Priority: High  . Type 2 diabetes mellitus with complication, without long-term current use of insulin ([place]) 08/12/2016    Priority: High  . Hypothyroidism (acquired) 05/29/2009    Priority: High  . Hyperlipidemia associated with type 2 diabetes mellitus ([place]) 09/01/2007    Priority: High  . CAD (coronary artery disease), native coronary artery 09/01/2007    Priority: High  . Bilateral primary osteoarthritis of knee 04/27/2020    Priority: Medium    xrays 04/2020, tricompartmental, worse medially   . Diverticulosis 07/13/2019    Priority: Medium  . GERD 09/01/2007    Priority: Medium  . Peptic ulcer 09/01/2007    Priority: Medium  . Former smoker 07/13/2019    Priority: Low  . Testosterone deficiency 09/01/2007    Priority: Low  . Brachial neuritis or radiculitis, right 02/20/2009    Social History: Patient  reports that he quit smoking about 8 years ago. His smoking use included pipe and cigars. He has never used smokeless tobacco. He reports current alcohol use. He reports that he does not use drugs.  Review of Systems: Ophthalmic: negative for eye pain, loss of vision or double vision Cardiovascular: negative for chest pain Respiratory: negative for SOB or persistent cough Gastrointestinal: negative for abdominal pain Genitourinary: negative for dysuria or gross hematuria MSK: negative for foot lesions Neurologic:  negative for weakness or gait disturbance  Objective  Vitals: BP 120/72   Pulse 71   Temp 97.9 F (36.6 C) (Temporal)   Resp 16   Ht 5\' 8"  (1.727 m)   Wt 203 lb 3.2 oz (92.2 kg)   SpO2 96%   BMI 30.90 kg/m  General: well appearing, no acute distress  Psych:  Alert and oriented, normal mood and affect  Cardiovascular:  Nl S1 and S2, RRR without murmur, gallop or rub. no edema Respiratory:  Good breath sounds bilaterally, CTAB with normal effort, no rales 89    Diabetic education: ongoing education regarding chronic disease management for diabetes was given today. We continue to reinforce the ABC's of diabetic management: A1c (<7 or 8 dependent upon patient), tight blood pressure control, and cholesterol management with goal LDL < 100 minimally.  We discuss diet strategies, exercise recommendations, medication options and possible side effects. At each visit, we review recommended immunizations and preventive care recommendations for diabetics and stress that good diabetic control can prevent other problems. See below for this patient's data.    Commons side effects, risks, benefits, and alternatives for medications and treatment plan prescribed today were discussed, and the patient expressed understanding of the given instructions. Patient is instructed to call or message via MyChart if he/she has any questions or concerns regarding our treatment plan. No barriers to understanding were identified. We discussed Red Flag symptoms and signs in detail. Patient expressed understanding regarding what to do in case of urgent or emergency type symptoms.   Medication list was reconciled, printed and provided to the patient in AVS. Patient instructions and summary information was reviewed with the patient as documented in the AVS. This note was prepared with assistance of Dragon voice recognition software. Occasional wrong-word or sound-a-like substitutions may have occurred due to the inherent  limitations of voice recognition software  This visit occurred during the SARS-CoV-2 public health emergency.  Safety protocols were in place, including screening questions prior to the visit, additional usage of staff PPE, and extensive cleaning of exam room while observing appropriate contact time as indicated for disinfecting solutions.

## 2020-09-20 NOTE — Patient Instructions (Signed)
Please return in 3 months to recheck your diabetes and see how you are tolerating the medication.   Continue the farxiga and use the yeast pill if needed.   If you have any questions or concerns, please don't hesitate to send me a message via MyChart or call the office at [phone]. Thank you for visiting with [place] today! It's our pleasure caring for you.

## 2020-09-28 ENCOUNTER — Other Ambulatory Visit: Payer: Self-pay | Admitting: Family Medicine

## 2020-09-28 DIAGNOSIS — E118 Type 2 diabetes mellitus with unspecified complications: Secondary | ICD-10-CM

## 2020-10-05 ENCOUNTER — Other Ambulatory Visit: Payer: Self-pay | Admitting: Family Medicine

## 2020-10-05 DIAGNOSIS — I2510 Atherosclerotic heart disease of native coronary artery without angina pectoris: Secondary | ICD-10-CM

## 2020-10-10 ENCOUNTER — Other Ambulatory Visit: Payer: Self-pay

## 2020-10-10 DIAGNOSIS — E118 Type 2 diabetes mellitus with unspecified complications: Secondary | ICD-10-CM

## 2020-10-10 DIAGNOSIS — R69 Illness, unspecified: Secondary | ICD-10-CM | POA: Diagnosis not present

## 2020-10-10 MED ORDER — ONETOUCH VERIO VI STRP: 1.0000 | ORAL_STRIP | Freq: Three times a day (TID) | 0 refills | Status: DC

## 2020-10-16 ENCOUNTER — Other Ambulatory Visit: Payer: Self-pay | Admitting: Family Medicine

## 2020-10-20 ENCOUNTER — Other Ambulatory Visit: Payer: Self-pay

## 2020-10-20 ENCOUNTER — Inpatient Hospital Stay: Payer: Medicare HMO | Attending: Hematology & Oncology

## 2020-10-20 DIAGNOSIS — Z23 Encounter for immunization: Secondary | ICD-10-CM | POA: Diagnosis present

## 2020-10-20 NOTE — Progress Notes (Signed)
   Covid-19 Vaccination Clinic  Name:  Pasqual Farias    MRN: [id number] DOB: July 13, 1946  10/20/2020  Mr. Noblet was observed post Covid-19 immunization for 15 minutes without incident. He was provided with Vaccine Information Sheet and instruction to access the V-Safe system.   Mr. Boschert was instructed to call 911 with any severe reactions post vaccine: Marland Kitchen Difficulty breathing  . Swelling of face and throat  . A fast heartbeat  . A bad rash all over body  . Dizziness and weakness   Immunizations Administered    Name Date Dose VIS Date Route   Pfizer COVID-19 Vaccine 10/20/2020  1:21 PM 0.3 mL 09/20/2020 Intramuscular   Manufacturer: [place]   Lot: [id number]   NDC: [address]-5

## 2020-11-05 DIAGNOSIS — R69 Illness, unspecified: Secondary | ICD-10-CM | POA: Diagnosis not present

## 2020-11-06 ENCOUNTER — Encounter: Payer: Self-pay | Admitting: Family Medicine

## 2020-12-22 ENCOUNTER — Ambulatory Visit: Payer: Medicare HMO | Admitting: Family Medicine

## 2020-12-27 ENCOUNTER — Encounter: Payer: Self-pay | Admitting: Family Medicine

## 2020-12-27 ENCOUNTER — Ambulatory Visit (INDEPENDENT_AMBULATORY_CARE_PROVIDER_SITE_OTHER): Payer: Medicare HMO | Admitting: Family Medicine

## 2020-12-27 VITALS — BP 100/60 | HR 62 | Temp 98.1°F | Ht 68.0 in | Wt 194.6 lb

## 2020-12-27 DIAGNOSIS — E039 Hypothyroidism, unspecified: Secondary | ICD-10-CM

## 2020-12-27 DIAGNOSIS — E118 Type 2 diabetes mellitus with unspecified complications: Secondary | ICD-10-CM

## 2020-12-27 DIAGNOSIS — I2510 Atherosclerotic heart disease of native coronary artery without angina pectoris: Secondary | ICD-10-CM

## 2020-12-27 DIAGNOSIS — B3742 Candidal balanitis: Secondary | ICD-10-CM

## 2020-12-27 DIAGNOSIS — I10 Essential (primary) hypertension: Secondary | ICD-10-CM

## 2020-12-27 LAB — POCT GLYCOSYLATED HEMOGLOBIN (HGB A1C): Hemoglobin A1C: 5.8 % — AB (ref 4.0–5.6)

## 2020-12-27 NOTE — Patient Instructions (Signed)
Please return in may for cpe and diabetes recheck.   If you have any questions or concerns, please don't hesitate to send me a message via MyChart or call the office at [phone]. Thank you for visiting with [place] today! It's our pleasure caring for you.

## 2020-12-27 NOTE — Progress Notes (Signed)
Subjective  CC:  Chief Complaint  Patient presents with  . Diabetes  . Medication Problem    [name] causes yeast infection.    HPI: Adrian Gamble is a 75 y.o. male who presents to the office today for follow up of diabetes and problems listed above in the chief complaint.   Diabetes follow up: His diabetic control is reported as Improved. He is eating better avoiding most sweets. Weight is down further. Tolerating farxiga although still with intermittent yeast balanitis resolves with 2days of clotrimazole.  He denies exertional CP or SOB or symptomatic hypoglycemia. He denies foot sores or paresthesias. Due eye exam  CAD w/o any chest pain, sob or le edema. On asp and bb and statin.   HLD at goal on high intensity statin w/o AEs.   Hypothyroidism: reviewed most recent tsh. Had to increase dose of med; now remains clinically stable. Feels well. Compliant with meds.   Wt Readings from Last 3 Encounters:  12/27/20 194 lb 9.6 oz (88.3 kg)  09/20/20 203 lb 3.2 oz (92.2 kg)  09/05/20 203 lb (92.1 kg)    BP Readings from Last 3 Encounters:  12/27/20 100/60  09/20/20 120/72  09/05/20 118/70    Assessment  1. Type 2 diabetes mellitus with complication, without long-term current use of insulin ([place])   2. Essential hypertension   3. Coronary artery disease involving native coronary artery of native heart without angina pectoris   4. Hypothyroidism (acquired)   5. Candidal balanitis      Plan   Diabetes is currently very well controlled. [name] has helped control. Diet is improved. Limited by balanitis. Discussed options. Pt will continue for 3 more months and continue to work on weight loss. If balanitis recurs, will stop or decrease to 5mg  daily. Pt agrees. imms up to date. To schedule eye exam  HTN: remains well controlled. Even a little low w/o sxs of orthostatic hypotension.   HLD: ldl 70s: will recheck in may and adjust up statin if remains > 70. Would like closer to 50 due  to cad.   CAD: no angina  Balanitis: rec full 7 days of treatment with next recurrence to cure. Good hygiene encouraged. Difficult due to adhesive foreskin. Should improve now that diabetic control is so good  Low thyroid now stable. Recheck 3 months to ensure stability.   Follow up: may for cpe and dm f/u. . Orders Placed This Encounter  Procedures  . POCT HgB A1C   No orders of the defined types were placed in this encounter.     Immunization History  Administered Date(s) Administered  . Fluad Quad(high Dose 65+) 09/16/2019, 09/05/2020  . Influenza Split 11/01/2011, 09/22/2012  . Influenza Whole 09/22/2009, 09/20/2010  . Influenza, High Dose Seasonal PF 10/03/2015, 11/11/2016, 10/13/2017, 11/20/2018  . Influenza,inj,Quad PF,6+ Mos 09/16/2013, 09/29/2014  . PFIZER(Purple Top)SARS-COV-2 Vaccination 02/15/2020, 03/07/2020, 10/20/2020  . Pneumococcal Conjugate-13 02/25/2014  . Pneumococcal Polysaccharide-23 09/01/2007, 07/31/2011  . Td 09/01/2007  . Zoster Recombinat (Shingrix) 08/10/2020, 10/10/2020    Diabetes Related Lab Review: Lab Results  Component Value Date   HGBA1C 5.8 (A) 12/27/2020   HGBA1C 6.5 (A) 09/20/2020   HGBA1C 7.5 (A) 04/26/2020    Lab Results  Component Value Date   MICROALBUR 15.8 (H) 04/26/2020   Lab Results  Component Value Date   CREATININE 1.05 04/26/2020   BUN 13 04/26/2020   NA 141 04/26/2020   K 3.9 04/26/2020   CL 106 04/26/2020   CO2 27 04/26/2020  Lab Results  Component Value Date   CHOL 131 04/26/2020   CHOL 121 01/12/2019   CHOL 143 07/07/2018   Lab Results  Component Value Date   HDL 37.90 (L) 04/26/2020   HDL 37.00 (L) 01/12/2019   HDL 36.70 (L) 07/07/2018   Lab Results  Component Value Date   LDLCALC 73 04/26/2020   LDLCALC 68 01/12/2019   LDLCALC 71 07/07/2018   Lab Results  Component Value Date   TRIG 100.0 04/26/2020   TRIG 80.0 01/12/2019   TRIG 177.0 (H) 07/07/2018   Lab Results  Component Value Date    CHOLHDL 3 04/26/2020   CHOLHDL 3 01/12/2019   CHOLHDL 4 07/07/2018   Lab Results  Component Value Date   TSH 1.88 06/19/2020    I have reviewed the PMH, Fam and Soc history. Patient Active Problem List   Diagnosis Date Noted  . History of MI (myocardial infarction) 04/26/2020    Priority: High    2001   . Hx of CABG 04/26/2020    Priority: High    2013   . Sleep behavior disorder, REM 09/16/2019    Priority: High    Neurology evaluating 08/2019; ? 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    . Essential hypertension 10/05/2018    Priority: High  . Type 2 diabetes mellitus with complication, without long-term current use of insulin ([place]) 08/12/2016    Priority: High    Low dose metformin due to GI side effects   . Hypothyroidism (acquired) 05/29/2009    Priority: High  . Hyperlipidemia associated with type 2 diabetes mellitus ([place]) 09/01/2007    Priority: High  . CAD (coronary artery disease), native coronary artery 09/01/2007    Priority: High  . Bilateral primary osteoarthritis of knee 04/27/2020    Priority: Medium    xrays 04/2020, tricompartmental, worse medially   . Diverticulosis 07/13/2019    Priority: Medium  . GERD 09/01/2007    Priority: Medium  . Peptic ulcer 09/01/2007    Priority: Medium  . Former smoker 07/13/2019    Priority: Low  . Testosterone deficiency 09/01/2007    Priority: Low  . Brachial neuritis or radiculitis, right 02/20/2009    Social History: Patient  reports that he quit smoking about 8 years ago. His smoking use included pipe and cigars. He has never used smokeless tobacco. He reports current alcohol use. He reports that he does not use drugs.  Review of Systems: Ophthalmic: negative for eye pain, loss of vision or double vision Cardiovascular: negative for chest pain Respiratory: negative for SOB or persistent cough Gastrointestinal: negative for abdominal pain Genitourinary: negative for dysuria or gross hematuria MSK:  negative for foot lesions Neurologic: negative for weakness or gait disturbance  Objective  Vitals: BP 100/60   Pulse 62   Temp 98.1 F (36.7 C) (Temporal)   Ht 5\' 8"  (1.727 m)   Wt 194 lb 9.6 oz (88.3 kg)   SpO2 96%   BMI 29.59 kg/m  General: well appearing, no acute distress  Psych:  Alert and oriented, normal mood and affect HEENT:  Normocephalic, atraumatic, moist mucous membranes, supple neck  Cardiovascular:  Nl S1 and S2, RRR without murmur, gallop or rub. no edema Respiratory:  Good breath sounds bilaterally, CTAB with normal effort, no rales  Diabetic education: ongoing education regarding chronic disease management for diabetes was given today. We continue to reinforce the ABC's of diabetic management: A1c (<7 or 8 dependent upon patient), tight blood pressure control,  and cholesterol management with goal LDL < 100 minimally. We discuss diet strategies, exercise recommendations, medication options and possible side effects. At each visit, we review recommended immunizations and preventive care recommendations for diabetics and stress that good diabetic control can prevent other problems. See below for this patient's data.    Commons side effects, risks, benefits, and alternatives for medications and treatment plan prescribed today were discussed, and the patient expressed understanding of the given instructions. Patient is instructed to call or message via MyChart if he/she has any questions or concerns regarding our treatment plan. No barriers to understanding were identified. We discussed Red Flag symptoms and signs in detail. Patient expressed understanding regarding what to do in case of urgent or emergency type symptoms.   Medication list was reconciled, printed and provided to the patient in AVS. Patient instructions and summary information was reviewed with the patient as documented in the AVS. This note was prepared with assistance of Dragon voice recognition software.  Occasional wrong-word or sound-a-like substitutions may have occurred due to the inherent limitations of voice recognition software  This visit occurred during the SARS-CoV-2 public health emergency.  Safety protocols were in place, including screening questions prior to the visit, additional usage of staff PPE, and extensive cleaning of exam room while observing appropriate contact time as indicated for disinfecting solutions.

## 2021-01-17 ENCOUNTER — Other Ambulatory Visit: Payer: Self-pay | Admitting: Family Medicine

## 2021-03-13 ENCOUNTER — Other Ambulatory Visit: Payer: Self-pay

## 2021-03-13 ENCOUNTER — Ambulatory Visit (INDEPENDENT_AMBULATORY_CARE_PROVIDER_SITE_OTHER): Payer: Medicare HMO | Admitting: Family Medicine

## 2021-03-13 ENCOUNTER — Encounter: Payer: Self-pay | Admitting: Family Medicine

## 2021-03-13 VITALS — BP 112/68 | HR 73 | Temp 97.5°F | Ht 68.0 in | Wt 193.8 lb

## 2021-03-13 DIAGNOSIS — E118 Type 2 diabetes mellitus with unspecified complications: Secondary | ICD-10-CM

## 2021-03-13 DIAGNOSIS — T50905A Adverse effect of unspecified drugs, medicaments and biological substances, initial encounter: Secondary | ICD-10-CM | POA: Diagnosis not present

## 2021-03-13 LAB — POCT GLYCOSYLATED HEMOGLOBIN (HGB A1C): Hemoglobin A1C: 5.8 % — AB (ref 4.0–5.6)

## 2021-03-13 NOTE — Progress Notes (Signed)
Subjective  CC:  Chief Complaint  Patient presents with  . Diabetes    Constant yeast infection from [name]     HPI: Adrian Gamble is a 75 y.o. male who presents to the office today for follow up of diabetes and problems listed above in the chief complaint.   Diabetes follow up: His diabetic control is reported as Unchanged.  However he has persistent and recurrent yeast balanitis due to the [place].  He often will have redness, soreness and dysuria.  This is been ongoing for the last 3 to 4 months. He denies exertional CP or SOB or symptomatic hypoglycemia. He denies foot sores or paresthesias.  He has decreased his weight since October, eating better.  He tolerates Metformin XR 500 mg daily.  Higher doses give him loose stools.   Wt Readings from Last 3 Encounters:  03/13/21 193 lb 12.8 oz (87.9 kg)  12/27/20 194 lb 9.6 oz (88.3 kg)  09/20/20 203 lb 3.2 oz (92.2 kg)    BP Readings from Last 3 Encounters:  03/13/21 112/68  12/27/20 100/60  09/20/20 120/72    Assessment  1. Type 2 diabetes mellitus with complication, without long-term current use of insulin ([name])   2. Adverse effect of drug, initial encounter      Plan   Diabetes is currently very well controlled. We will stop [name] due to adverse effects.  Hopefully with weight loss and diet he will not need any other medications.  Can recheck in 3 months.  He will check some fasting and postprandial sugars remain prior to his next visit.  We can try [name] 5 mg daily or a low-dose glipizide.  Follow up: as scheduled for cpe in may. Orders Placed This Encounter  Procedures  . POCT HgB A1C   No orders of the defined types were placed in this encounter.     Immunization History  Administered Date(s) Administered  . Fluad Quad(high Dose 65+) 09/16/2019, 09/05/2020  . Influenza Split 11/01/2011, 09/22/2012  . Influenza Whole 09/22/2009, 09/20/2010  . Influenza, High Dose Seasonal PF 10/03/2015, 11/11/2016,  10/13/2017, 11/20/2018  . Influenza,inj,Quad PF,6+ Mos 09/16/2013, 09/29/2014  . PFIZER(Purple Top)SARS-COV-2 Vaccination 02/15/2020, 03/07/2020, 10/20/2020  . Pneumococcal Conjugate-13 02/25/2014  . Pneumococcal Polysaccharide-23 09/01/2007, 07/31/2011  . Td 09/01/2007  . Zoster Recombinat (Shingrix) 08/10/2020, 10/10/2020    Diabetes Related Lab Review: Lab Results  Component Value Date   HGBA1C 5.8 (A) 03/13/2021   HGBA1C 5.8 (A) 12/27/2020   HGBA1C 6.5 (A) 09/20/2020    Lab Results  Component Value Date   MICROALBUR 15.8 (H) 04/26/2020   Lab Results  Component Value Date   CREATININE 1.05 04/26/2020   BUN 13 04/26/2020   NA 141 04/26/2020   K 3.9 04/26/2020   CL 106 04/26/2020   CO2 27 04/26/2020   Lab Results  Component Value Date   CHOL 131 04/26/2020   CHOL 121 01/12/2019   CHOL 143 07/07/2018   Lab Results  Component Value Date   HDL 37.90 (L) 04/26/2020   HDL 37.00 (L) 01/12/2019   HDL 36.70 (L) 07/07/2018   Lab Results  Component Value Date   LDLCALC 73 04/26/2020   LDLCALC 68 01/12/2019   LDLCALC 71 07/07/2018   Lab Results  Component Value Date   TRIG 100.0 04/26/2020   TRIG 80.0 01/12/2019   TRIG 177.0 (H) 07/07/2018   Lab Results  Component Value Date   CHOLHDL 3 04/26/2020   CHOLHDL 3 01/12/2019   CHOLHDL 4  07/07/2018   No results found for: LDLDIRECT The ASCVD Risk score Adrian Gamble., et al., 2013) failed to calculate for the following reasons:   The patient has a prior MI or stroke diagnosis I have reviewed the [place], Fam and Soc history. Patient Active Problem List   Diagnosis Date Noted  . History of MI (myocardial infarction) 04/26/2020    Priority: High    2001   . Hx of CABG 04/26/2020    Priority: High    2013   . Sleep behavior disorder, REM 09/16/2019    Priority: High    Neurology evaluating 08/2019; ? 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    . Essential hypertension 10/05/2018    Priority: High  . Type 2  diabetes mellitus with complication, without long-term current use of insulin ([place]) 08/12/2016    Priority: High    Low dose metformin due to GI side effects   . Hypothyroidism (acquired) 05/29/2009    Priority: High  . Hyperlipidemia associated with type 2 diabetes mellitus ([place]) 09/01/2007    Priority: High  . CAD (coronary artery disease), native coronary artery 09/01/2007    Priority: High  . Bilateral primary osteoarthritis of knee 04/27/2020    Priority: Medium    xrays 04/2020, tricompartmental, worse medially   . Diverticulosis 07/13/2019    Priority: Medium  . GERD 09/01/2007    Priority: Medium  . Peptic ulcer 09/01/2007    Priority: Medium  . Former smoker 07/13/2019    Priority: Low  . Testosterone deficiency 09/01/2007    Priority: Low  . Brachial neuritis or radiculitis, right 02/20/2009    Social History: Patient  reports that he quit smoking about 8 years ago. His smoking use included pipe and cigars. He has never used smokeless tobacco. He reports current alcohol use. He reports that he does not use drugs.  Review of Systems: Ophthalmic: negative for eye pain, loss of vision or double vision Cardiovascular: negative for chest pain Respiratory: negative for SOB or persistent cough Gastrointestinal: negative for abdominal pain Genitourinary: negative for dysuria or gross hematuria MSK: negative for foot lesions Neurologic: negative for weakness or gait disturbance  Objective  Vitals: BP 112/68   Pulse 73   Temp (!) 97.5 F (36.4 C) (Temporal)   Ht 5\' 8"  (1.727 m)   Wt 193 lb 12.8 oz (87.9 kg)   SpO2 95%   BMI 29.47 kg/m  General: well appearing, no acute distress    Diabetic education: ongoing education regarding chronic disease management for diabetes was given today. We continue to reinforce the ABC's of diabetic management: A1c (<7 or 8 dependent upon patient), tight blood pressure control, and cholesterol management with goal LDL < 100 minimally.  We discuss diet strategies, exercise recommendations, medication options and possible side effects. At each visit, we review recommended immunizations and preventive care recommendations for diabetics and stress that good diabetic control can prevent other problems. See below for this patient's data.    Commons side effects, risks, benefits, and alternatives for medications and treatment plan prescribed today were discussed, and the patient expressed understanding of the given instructions. Patient is instructed to call or message via MyChart if he/she has any questions or concerns regarding our treatment plan. No barriers to understanding were identified. We discussed Red Flag symptoms and signs in detail. Patient expressed understanding regarding what to do in case of urgent or emergency type symptoms.   Medication list was reconciled, printed and provided to the patient  in AVS. Patient instructions and summary information was reviewed with the patient as documented in the AVS. This note was prepared with assistance of Dragon voice recognition software. Occasional wrong-word or sound-a-like substitutions may have occurred due to the inherent limitations of voice recognition software  This visit occurred during the SARS-CoV-2 public health emergency.  Safety protocols were in place, including screening questions prior to the visit, additional usage of staff PPE, and extensive cleaning of exam room while observing appropriate contact time as indicated for disinfecting solutions.

## 2021-03-13 NOTE — Patient Instructions (Addendum)
Please follow up as scheduled for your next visit with me: 05/01/2021   Please bring in a log of your sugars: fastings and/or 2 hours after a meal. Start checking intermittently in May.  Stop the farxiga. Continue to follow a diabetic diet.   If you have any questions or concerns, please don't hesitate to send me a message via MyChart or call the office at [phone]. Thank you for visiting with [place] today! It's our pleasure caring for you.

## 2021-03-20 ENCOUNTER — Telehealth: Payer: Self-pay | Admitting: Family Medicine

## 2021-03-20 NOTE — Progress Notes (Signed)
  Chronic Care Management   Outreach Note  03/20/2021 Name: Adrian Gamble MRN: [id number] DOB: 17-Jan-1946  Referred by: Leamon Arnt, MD Reason for referral : No chief complaint on file.   An unsuccessful telephone outreach was attempted today. The patient was referred to the pharmacist for assistance with care management and care coordination.   Follow Up Plan:   [name] Upstream Scheduler

## 2021-03-20 NOTE — Chronic Care Management (AMB) (Signed)
  Chronic Care Management   Note  03/20/2021 Name: Adrian Gamble MRN: [id number] DOB: 1946-10-23  Adrian Gamble is a 75 y.o. year old male who is a primary care patient of Leamon Arnt, MD. I reached out to Quenton Fetter by phone today in response to a referral sent by [name] PCP, Leamon Arnt, MD.   [name] was given information about Chronic Care Management services today including:  1. CCM service includes personalized support from designated clinical staff supervised by his physician, including individualized plan of care and coordination with other care providers 2. 24/7 contact phone numbers for assistance for urgent and routine care needs. 3. Service will only be billed when office clinical staff spend 20 minutes or more in a month to coordinate care. 4. Only one practitioner may furnish and bill the service in a calendar month. 5. The patient may stop CCM services at any time (effective at the end of the month) by phone call to the office staff.   Patient agreed to services and verbal consent obtained.   Follow up plan:   [name] Upstream Scheduler

## 2021-04-05 ENCOUNTER — Encounter: Payer: Self-pay | Admitting: Family Medicine

## 2021-04-11 ENCOUNTER — Telehealth: Payer: Medicare HMO

## 2021-04-11 ENCOUNTER — Other Ambulatory Visit: Payer: Self-pay | Admitting: Family Medicine

## 2021-04-17 NOTE — Progress Notes (Signed)
Chronic Care Management Pharmacy Note  04/23/2021 Name:  Adrian Gamble MRN:  [id number] DOB:  [birth year]  Subjective: Adrian Gamble is an 75 y.o. year old male who is a primary patient of Leamon Arnt, MD.  The CCM team was consulted for assistance with disease management and care coordination needs.    Engaged with patient by telephone for initial visit in response to provider referral for pharmacy case management and/or care coordination services.   Consent to Services:  The patient was given the following information about Chronic Care Management services today, agreed to services, and gave verbal consent: 1. CCM service includes personalized support from designated clinical staff supervised by the primary care provider, including individualized plan of care and coordination with other care providers 2. 24/7 contact phone numbers for assistance for urgent and routine care needs. 3. Service will only be billed when office clinical staff spend 20 minutes or more in a month to coordinate care. 4. Only one practitioner may furnish and bill the service in a calendar month. 5.The patient may stop CCM services at any time (effective at the end of the month) by phone call to the office staff. 6. The patient will be responsible for cost sharing (co-pay) of up to 20% of the service fee (after annual deductible is met). Patient agreed to services and consent obtained.  Patient Care Team: Leamon Arnt, MD as PCP - General (Family Medicine) Minus Breeding, MD as PCP - Cardiology (Cardiology) Grace Isaac, MD (Inactive) as Consulting Physician (Cardiothoracic Surgery) Allyn Kenner, MD (Dermatology) Minus Breeding, MD as Consulting Physician (Cardiology) Dohmeier, Asencion Partridge, MD as Consulting Physician (Neurology) Madelin Rear, [facility] as Pharmacist (Pharmacist)  Hospital visits: None in previous 6 months  Objective:  Lab Results  Component Value Date   CREATININE 1.05 04/26/2020   CREATININE  0.96 08/13/2019   CREATININE 0.89 01/19/2019    Lab Results  Component Value Date   HGBA1C 5.8 (A) 03/13/2021   Last diabetic Eye exam:  Lab Results  Component Value Date/Time   HMDIABEYEEXA No Retinopathy 02/11/2019 12:00 AM    Last diabetic Foot exam: No results found for: HMDIABFOOTEX      Component Value Date/Time   CHOL 131 04/26/2020 0957   TRIG 100.0 04/26/2020 0957   HDL 37.90 (L) 04/26/2020 0957   CHOLHDL 3 04/26/2020 0957   VLDL 20.0 04/26/2020 0957   LDLCALC 73 04/26/2020 0957    Hepatic Function Latest Ref Rng & Units 04/26/2020 08/13/2019 01/19/2019  Total Protein 6.0 - 8.3 g/dL 6.7 6.6 7.1  Albumin 3.5 - 5.2 g/dL 4.4 4.2 4.2  AST 0 - 37 U/L _0 ALT 0 - 53 U/L _1 Alk Phosphatase 39 - 117 U/L 54 58 56  Total Bilirubin 0.2 - 1.2 mg/dL 0.6 0.8 0.9  Bilirubin, Direct 0.0 - 0.3 mg/dL - - -    Lab Results  Component Value Date/Time   TSH 1.88 06/19/2020 10:17 AM   TSH 8.41 (H) 04/26/2020 09:57 AM   FREET4 1.33 07/19/2019 09:24 AM   FREET4 1.28 05/03/2019 08:24 AM    CBC Latest Ref Rng & Units 04/26/2020 08/13/2019 01/12/2019  WBC 4.0 - 10.5 K/uL 5.7 6.4 5.5  Hemoglobin 13.0 - 17.0 g/dL 14.6 15.1 15.5  Hematocrit 39.0 - 52.0 % 43.4 43.8 45.1  Platelets 150.0 - 400.0 K/uL 150.0 134.0(L) 153.0    No results found for: VD25OH  Clinical ASCVD: Yes  The ASCVD Risk score Mikey Bussing DC  Jr., et al., 2013) failed to calculate for the following reasons:   The patient has a prior MI or stroke diagnosis    Other: (CHADS2VASc if Afib, PHQ9 if depression, MMRC or CAT for COPD, ACT, DEXA)  Social History   Tobacco Use  Smoking Status Former Smoker  . Types: Pipe, Cigars  . Quit date: 06/01/2012  . Years since quitting: 8.8  Smokeless Tobacco Never Used  Tobacco Comment   patient states that he is not somking the pipe or cigars any more   BP Readings from Last 3 Encounters:  03/13/21 112/68  12/27/20 100/60  09/20/20 120/72   Pulse Readings from Last 3  Encounters:  03/13/21 73  12/27/20 62  09/20/20 71   Wt Readings from Last 3 Encounters:  03/13/21 193 lb 12.8 oz (87.9 kg)  12/27/20 194 lb 9.6 oz (88.3 kg)  09/20/20 203 lb 3.2 oz (92.2 kg)    Assessment: Review of patient past medical history, allergies, medications, health status, including review of consultants reports, laboratory and other test data, was performed as part of comprehensive evaluation and provision of chronic care management services.   SDOH:  (Social Determinants of Health) assessments and interventions performed: Yes   CCM Care Plan  No Known Allergies  Medications Reviewed Today    Reviewed by Madelin Rear, [facility] (Pharmacist) on 04/23/21 at [phone]  Med List Status: <None>  Medication Order Taking? Sig Documenting Provider Last Dose Status Informant  aspirin EC 81 MG tablet [id number] Yes Take 81 mg by mouth daily. [provider] Taking Active   glucose blood (ONETOUCH VERIO) test strip [id number] Yes 1 each by Other route in the morning, at noon, and at bedtime. Check blood sugar up to 3 times daily Leamon Arnt, MD Taking Active   levothyroxine (SYNTHROID) 112 MCG tablet [id number] Yes Take 1 tablet by mouth once daily Leamon Arnt, MD Taking Active   MELATONIN ER PO [id number] Yes Take by mouth. [provider] Taking Active   metFORMIN (GLUCOPHAGE-XR) 500 MG 24 hr tablet [id number] Yes Take 1 tablet (500 mg total) by mouth daily with breakfast. Leamon Arnt, MD Taking Active   metoprolol tartrate (LOPRESSOR) 25 MG tablet [id number] Yes Take 1/2 (one-half) tablet by mouth twice daily Leamon Arnt, MD Taking Active   rosuvastatin (CRESTOR) 40 MG tablet [id number] Yes Take 1 tablet by mouth once daily Leamon Arnt, MD Taking Active   Scar Treatment Products [facility]) GEL [id number] Yes Apply 1 application topically daily. [provider] Taking Active   vitamin B-12 (CYANOCOBALAMIN) 100 MCG tablet [id number] Yes Take 100 mcg by  mouth daily. [provider] Taking Active Self          Patient Active Problem List   Diagnosis Date Noted  . Bilateral primary osteoarthritis of knee 04/27/2020  . History of MI (myocardial infarction) 04/26/2020  . Hx of CABG 04/26/2020  . 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 Diverticulosis 07/13/2019  . Former smoker 07/13/2019  . Essential hypertension 10/05/2018  . Type 2 diabetes mellitus with complication, without long-term current use of insulin ([place]) 08/12/2016  . Hypothyroidism (acquired) 05/29/2009  . 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 Testosterone deficiency 09/01/2007  . Hyperlipidemia associated with type 2 diabetes mellitus ([place]) 09/01/2007  . CAD (coronary artery disease), native coronary artery 09/01/2007  . GERD 09/01/2007  . Peptic ulcer 09/01/2007    Immunization History  Administered Date(s) Administered  . Fluad Quad(high Dose 65+) 09/16/2019,  09/05/2020  . Influenza Split 11/01/2011, 09/22/2012  . Influenza Whole 09/22/2009, 09/20/2010  . Influenza, High Dose Seasonal PF 10/03/2015, 11/11/2016, 10/13/2017, 11/20/2018  . Influenza,inj,Quad PF,6+ Mos 09/16/2013, 09/29/2014  . PFIZER(Purple Top)SARS-COV-2 Vaccination 02/15/2020, 03/07/2020, 10/20/2020  . Pneumococcal Conjugate-13 02/25/2014  . Pneumococcal Polysaccharide-23 09/01/2007, 07/31/2011  . Td 09/01/2007  . Zoster Recombinat (Shingrix) 08/10/2020, 10/10/2020    Conditions to be addressed/monitored: HTN, DM II, CAD - hx of MI, hypothyroidism, GERD - peptic ulcer, former smoker, OA  Care Plan : [place]  Updates made by Madelin Rear, [facility] since 04/23/2021 12:00 AM    Problem: HTN, DM II, CAD - hx of MI, hypothyroidism, GERD - peptic ulcer, former smoker, OA   Priority: High    Long-Range Goal: Disease Management   Start Date: 04/23/2021  Expected End Date: 04/23/2022  This Visit's Progress: On track  Priority: High  Note:   Current Barriers:   Increase routine exercise, get back to [facility]  Pharmacist Clinical Goal(s):  Marland Kitchen Patient will contact provider office for questions/concerns as evidenced notation of same in electronic health record through collaboration with PharmD and provider.   Interventions: . 1:1 collaboration with Leamon Arnt, MD regarding development and update of comprehensive plan of care as evidenced by provider attestation and co-signature . Inter-disciplinary care team collaboration (see longitudinal plan of care) . Comprehensive medication review performed; medication list updated in electronic medical record . No Rx Changes  Hypertension (BP goal <130/80) -Controlled -Secondary prevention  -Current treatment: . Metoprolol tartrate 25 mg tablet - 1/2 tablet (12.5 mg twice daily) -Current home readings: never any high BP, consistently at goal  -Denies hypotensive/hypertensive symptoms -Educated on Exercise goal of 150 minutes per week; -Counseled to monitor BP at home at least once every 1-2 weeks, document, and provide log at future appointments -Counseled on diet and exercise extensively  Hyperlipidemia: (LDL goal < 70) -Controlled, near goal -Current treatment: . Rosuvastatin 40 mg once daily -Medications previously tried: atorvastatin 80 mg, simvastatin 40 mg   -Reviewed side effects - no problems noted -Educated on Cholesterol goals;  -Recommended to continue current medication  Diabetes (A1c goal <7%) -Controlled, last a1c 5.8% when on both metformin and farxiga -Current medications: Marland Kitchen Metformin XR 500 mg once daily -Medications previously tried: farxiga 10 mg (recurrent balanitis - stopped 03/13/2021), metformin 1000 mg/day (diarrhea) -Current home glucose readings . fasting glucose: 105-140 . post prandial glucose: <200 -Denies hypoglycemic/hyperglycemic symptoms -Current meal patterns: occasional sweets, eggs for breakfast. Drinks: coffee, coke zero, water. No alcohol.  -Current exercise:  walking routinely, projects like sanding the car. Trying to get back to the [employer] -Educated on A1c and blood sugar goals; -Recommended to continue current medication. Pt preference to optimize DM with metformin + exercise, GLP could be considered due to CV benefit   -CPE already scheduled EOM  Patient Goals/Self-Care Activities . Patient will:  - target a minimum of 150 minutes of moderate intensity exercise weekly     Medication Assistance: None required.  Patient affirms current coverage meets needs. Patient's preferred pharmacy is: [facility] [address], [place] - [address] [place] [address] Phone: [phone] Fax: [phone]  Follow Up:  Patient agrees to Care Plan and Follow-up. Plan: CPP telephone visit 6-8 months - DM/HTN/HLD at goal  Future Appointments  Date Time Provider [place]  05/01/2021  8:00 AM Leamon Arnt, MD [facility]  09/07/2021 11:45 AM [facility] HEALTH COACH [facility]  12/25/2021  9:00 AM [facility] CCM PHARMACIST [facility]   Madelin Rear, PharmD, CPP Clinical Pharmacist Practitioner  [facility]  [phone]

## 2021-04-17 NOTE — Patient Instructions (Signed)
Adrian Gamble,  Thank you for talking with me today. I have included our care plan/goals in the following pages.   Please review and call me at [phone] with any questions.  Thanks! Ellin Mayhew, PharmD, CPP Clinical Pharmacist Practitioner  [facility]  [phone]  There are no care plans to display for this patient.   The patient was given the following information about Chronic Care Management services today, agreed to services, and gave verbal consent: 1. CCM service includes personalized support from designated clinical staff supervised by the primary care provider, including individualized plan of care and coordination with other care providers 2. 24/7 contact phone numbers for assistance for urgent and routine care needs. 3. Service will only be billed when office clinical staff spend 20 minutes or more in a month to coordinate care. 4. Only one practitioner may furnish and bill the service in a calendar month. 5.The patient may stop CCM services at any time (effective at the end of the month) by phone call to the office staff. 6. The patient will be responsible for cost sharing (co-pay) of up to 20% of the service fee (after annual deductible is met). Patient agreed to services and consent obtained.  The patient verbalized understanding of instructions provided today and agreed to receive a MyChart copy of patient instruction and/or educational materials. Telephone follow up appointment with pharmacy team member scheduled for: See next appointment with "Care Management Staff" under "What's Next" below.   Hypertension, Adult High blood pressure (hypertension) is when the force of blood pumping through the arteries is too strong. The arteries are the blood vessels that carry blood from the heart throughout the body. Hypertension forces the heart to work harder to pump blood and may cause arteries to become narrow or stiff. Untreated or uncontrolled hypertension can  cause a heart attack, heart failure, a stroke, kidney disease, and other problems. A blood pressure reading consists of a higher number over a lower number. Ideally, your blood pressure should be below 120/80. The first ("top") number is called the systolic pressure. It is a measure of the pressure in your arteries as your heart beats. The second ("bottom") number is called the diastolic pressure. It is a measure of the pressure in your arteries as the heart relaxes. What are the causes? The exact cause of this condition is not known. There are some conditions that result in or are related to high blood pressure. What increases the risk? Some risk factors for high blood pressure are under your control. The following factors may make you more likely to develop this condition:  Smoking.  Having type 2 diabetes mellitus, high cholesterol, or both.  Not getting enough exercise or physical activity.  Being overweight.  Having too much fat, sugar, calories, or salt (sodium) in your diet.  Drinking too much alcohol. Some risk factors for high blood pressure may be difficult or impossible to change. Some of these factors include:  Having chronic kidney disease.  Having a family history of high blood pressure.  Age. Risk increases with age.  Race. You may be at higher risk if you are African American.  Gender. Men are at higher risk than women before age 44. After age 19, women are at higher risk than men.  Having obstructive sleep apnea.  Stress. What are the signs or symptoms? High blood pressure may not cause symptoms. Very high blood pressure (hypertensive crisis) may cause:  Headache.  Anxiety.  Shortness  of breath.  Nosebleed.  Nausea and vomiting.  Vision changes.  Severe chest pain.  Seizures. How is this diagnosed? This condition is diagnosed by measuring your blood pressure while you are seated, with your arm resting on a flat surface, your legs uncrossed, and  your feet flat on the floor. The cuff of the blood pressure monitor will be placed directly against the skin of your upper arm at the level of your heart. It should be measured at least twice using the same arm. Certain conditions can cause a difference in blood pressure between your right and left arms. Certain factors can cause blood pressure readings to be lower or higher than normal for a short period of time:  When your blood pressure is higher when you are in a health care provider's office than when you are at home, this is called white coat hypertension. Most people with this condition do not need medicines.  When your blood pressure is higher at home than when you are in a health care provider's office, this is called masked hypertension. Most people with this condition may need medicines to control blood pressure. If you have a high blood pressure reading during one visit or you have normal blood pressure with other risk factors, you may be asked to:  Return on a different day to have your blood pressure checked again.  Monitor your blood pressure at home for 1 week or longer. If you are diagnosed with hypertension, you may have other blood or imaging tests to help your health care provider understand your overall risk for other conditions. How is this treated? This condition is treated by making healthy lifestyle changes, such as eating healthy foods, exercising more, and reducing your alcohol intake. Your health care provider may prescribe medicine if lifestyle changes are not enough to get your blood pressure under control, and if:  Your systolic blood pressure is above 130.  Your diastolic blood pressure is above 80. Your personal target blood pressure may vary depending on your medical conditions, your age, and other factors. Follow these instructions at home: Eating and drinking  Eat a diet that is high in fiber and potassium, and low in sodium, added sugar, and fat. An example  eating plan is called the DASH (Dietary Approaches to Stop Hypertension) diet. To eat this way: ? Eat plenty of fresh fruits and vegetables. Try to fill one half of your plate at each meal with fruits and vegetables. ? Eat whole grains, such as whole-wheat pasta, brown rice, or whole-grain bread. Fill about one fourth of your plate with whole grains. ? Eat or drink low-fat dairy products, such as skim milk or low-fat yogurt. ? Avoid fatty cuts of meat, processed or cured meats, and poultry with skin. Fill about one fourth of your plate with lean proteins, such as fish, chicken without skin, beans, eggs, or tofu. ? Avoid pre-made and processed foods. These tend to be higher in sodium, added sugar, and fat.  Reduce your daily sodium intake. Most people with hypertension should eat less than 1,500 mg of sodium a day.  Do not drink alcohol if: ? Your health care provider tells you not to drink. ? You are pregnant, may be pregnant, or are planning to become pregnant.  If you drink alcohol: ? Limit how much you use to:  0-1 drink a day for women.  0-2 drinks a day for men. ? Be aware of how much alcohol is in your drink. In the [place]  one drink equals one 12 oz bottle of beer (355 mL), one 5 oz glass of wine (148 mL), or one 1 oz glass of hard liquor (44 mL).   Lifestyle  Work with your health care provider to maintain a healthy body weight or to lose weight. Ask what an ideal weight is for you.  Get at least 30 minutes of exercise most days of the week. Activities may include walking, swimming, or biking.  Include exercise to strengthen your muscles (resistance exercise), such as Pilates or lifting weights, as part of your weekly exercise routine. Try to do these types of exercises for 30 minutes at least 3 days a week.  Do not use any products that contain nicotine or tobacco, such as cigarettes, e-cigarettes, and chewing tobacco. If you need help quitting, ask your health care  provider.  Monitor your blood pressure at home as told by your health care provider.  Keep all follow-up visits as told by your health care provider. This is important.   Medicines  Take over-the-counter and prescription medicines only as told by your health care provider. Follow directions carefully. Blood pressure medicines must be taken as prescribed.  Do not skip doses of blood pressure medicine. Doing this puts you at risk for problems and can make the medicine less effective.  Ask your health care provider about side effects or reactions to medicines that you should watch for. Contact a health care provider if you:  Think you are having a reaction to a medicine you are taking.  Have headaches that keep coming back (recurring).  Feel dizzy.  Have swelling in your ankles.  Have trouble with your vision. Get help right away if you:  Develop a severe headache or confusion.  Have unusual weakness or numbness.  Feel faint.  Have severe pain in your chest or abdomen.  Vomit repeatedly.  Have trouble breathing. Summary  Hypertension is when the force of blood pumping through your arteries is too strong. If this condition is not controlled, it may put you at risk for serious complications.  Your personal target blood pressure may vary depending on your medical conditions, your age, and other factors. For most people, a normal blood pressure is less than 120/80.  Hypertension is treated with lifestyle changes, medicines, or a combination of both. Lifestyle changes include losing weight, eating a healthy, low-sodium diet, exercising more, and limiting alcohol. This information is not intended to replace advice given to you by your health care provider. Make sure you discuss any questions you have with your health care provider. Document Revised: 07/29/2018 Document Reviewed: 07/29/2018 Elsevier Patient Education  2021 [employer].

## 2021-04-18 ENCOUNTER — Telehealth: Payer: Self-pay

## 2021-04-18 NOTE — Chronic Care Management (AMB) (Signed)
    Chronic Care Management Pharmacy Assistant   Name: Adrian Gamble  MRN: [id number] DOB: Mar 27, 1946  Adrian Gamble is an 75 y.o. year old male who presents for his initial CCM visit with the clinical pharmacist.  Reason for Encounter: Chart Prep  Recent office visits:  03/13/21- Billey Chang, MD- Seen for type 2 DM, stopped farxiga, follow up 3 months  12/27/20- Billey Chang, MD- seen for chronic conditions, no medication changes, follow up as scheduled  Recent consult visits:  No visits noted   Hospital visits:  None in previous 6 months  Medications: Outpatient Encounter Medications as of 04/18/2021  Medication Sig  . aspirin EC 81 MG tablet Take 81 mg by mouth daily.  . clotrimazole (LOTRIMIN) 1 % cream Apply 1 application topically 2 (two) times daily. For 1 to 3 weeks  . glucose blood (ONETOUCH VERIO) test strip 1 each by Other route in the morning, at noon, and at bedtime. Check blood sugar up to 3 times daily  . levothyroxine (SYNTHROID) 112 MCG tablet Take 1 tablet by mouth once daily  . MELATONIN ER PO Take by mouth.  . metFORMIN (GLUCOPHAGE-XR) 500 MG 24 hr tablet Take 1 tablet (500 mg total) by mouth daily with breakfast.  . metoprolol tartrate (LOPRESSOR) 25 MG tablet Take 1/2 (one-half) tablet by mouth twice daily  . rosuvastatin (CRESTOR) 40 MG tablet Take 1 tablet by mouth once daily  . Scar Treatment Products [facility]) GEL Apply 1 application topically daily.  . vitamin B-12 (CYANOCOBALAMIN) 100 MCG tablet Take 100 mcg by mouth daily.   No facility-administered encounter medications on file as of 04/18/2021.   Several unsuccessful attempts made to contact patient   Current Documented Medications Asprin 81 mg- Clotrimazole 1%-  Levothyroxine 112 mcg- 90 DS last filled 01/19/21 Metformin 500 mg- 90 DS last filled 02/21/21 Metoprolol Tartrate 25 mg- 90 DS last filled 10/05/20 Rosuvastatin 40 mg- 90 DS last filled 02/07/21 Vitamin B-12 Melatonin  Mederma  Have  you seen any other providers since your last visit?   Any changes in your medications or health?    Any side effects from any medications?    Do you have an symptoms or problems not managed by your medications?   Any concerns about your health right now?   Has your provider asked that you check blood pressure, blood sugar, or follow special diet at home?    Do you get any type of exercise on a regular basis?    Can you think of a goal you would like to reach for your health?    Do you have any problems getting your medications?    Is there anything that you would like to discuss during the appointment?    Please bring medications and supplements to appointment Reminded patient of initial phone visit with CPP on 05/23 at 9 am  [place]

## 2021-04-20 ENCOUNTER — Other Ambulatory Visit: Payer: Self-pay | Admitting: Family Medicine

## 2021-04-20 DIAGNOSIS — I2510 Atherosclerotic heart disease of native coronary artery without angina pectoris: Secondary | ICD-10-CM

## 2021-04-23 ENCOUNTER — Ambulatory Visit (INDEPENDENT_AMBULATORY_CARE_PROVIDER_SITE_OTHER): Payer: Medicare HMO

## 2021-04-23 ENCOUNTER — Telehealth: Payer: Self-pay | Admitting: Cardiology

## 2021-04-23 DIAGNOSIS — E118 Type 2 diabetes mellitus with unspecified complications: Secondary | ICD-10-CM

## 2021-04-23 DIAGNOSIS — E1169 Type 2 diabetes mellitus with other specified complication: Secondary | ICD-10-CM | POA: Diagnosis not present

## 2021-04-23 DIAGNOSIS — E785 Hyperlipidemia, unspecified: Secondary | ICD-10-CM | POA: Diagnosis not present

## 2021-04-23 DIAGNOSIS — I10 Essential (primary) hypertension: Secondary | ICD-10-CM

## 2021-04-23 NOTE — Telephone Encounter (Signed)
Pt c/o of Chest Pain: STAT if CP now or developed within 24 hours  1. Are you having CP right now? NO  2. Are you experiencing any other symptoms (ex. SOB, nausea, vomiting, sweating)? none  3. How long have you been experiencing CP? A few months  4. Is your CP continuous or coming and going? Coming and going   5. Have you taken Nitroglycerin? No ?

## 2021-04-23 NOTE — Telephone Encounter (Signed)
Returned call to patient-patient reports increased exertional chest pain over the last 2 months.   Reports increasing in frequency.   He push mows the yard and reports having to take 4 breaks while mowing due to chest pain/discomfort.  He reports he has to sit down for about 10 mins and the pain resolves.  Reports a little SOB with discomfort but denies N/V/D.   He also notices the pain/discomfort when he walks from one room to the other.    Resolves with rest.   Does not have ntg at home.  Reports BP has been "good", never been elevated in the past.  Reports having issues with elevated blood sugars but his doctor is working on medication changes for this.   He reports with medication.    Last OV with Dr. Percival Spanish (virtual) 11/2019 Hx of CAD s/p CABG 2013  Appt scheduled with Dr. Percival Spanish 5/26 at 2pm ER precautions discussed with patient, he verbalized understanding.

## 2021-04-23 NOTE — Telephone Encounter (Signed)
Yes renew NTG.

## 2021-04-24 DIAGNOSIS — E669 Obesity, unspecified: Secondary | ICD-10-CM | POA: Diagnosis not present

## 2021-04-24 DIAGNOSIS — N529 Male erectile dysfunction, unspecified: Secondary | ICD-10-CM | POA: Diagnosis not present

## 2021-04-24 DIAGNOSIS — I2510 Atherosclerotic heart disease of native coronary artery without angina pectoris: Secondary | ICD-10-CM | POA: Diagnosis not present

## 2021-04-24 DIAGNOSIS — E1165 Type 2 diabetes mellitus with hyperglycemia: Secondary | ICD-10-CM | POA: Diagnosis not present

## 2021-04-24 DIAGNOSIS — E039 Hypothyroidism, unspecified: Secondary | ICD-10-CM | POA: Diagnosis not present

## 2021-04-24 DIAGNOSIS — R32 Unspecified urinary incontinence: Secondary | ICD-10-CM | POA: Diagnosis not present

## 2021-04-24 DIAGNOSIS — E785 Hyperlipidemia, unspecified: Secondary | ICD-10-CM | POA: Diagnosis not present

## 2021-04-24 DIAGNOSIS — Z6830 Body mass index (BMI) 30.0-30.9, adult: Secondary | ICD-10-CM | POA: Diagnosis not present

## 2021-04-24 DIAGNOSIS — I252 Old myocardial infarction: Secondary | ICD-10-CM | POA: Diagnosis not present

## 2021-04-24 DIAGNOSIS — M1990 Unspecified osteoarthritis, unspecified site: Secondary | ICD-10-CM | POA: Diagnosis not present

## 2021-04-24 MED ORDER — NITROGLYCERIN 0.4 MG SL SUBL: 0.4000 mg | SUBLINGUAL_TABLET | SUBLINGUAL | 3 refills | Status: AC | PRN

## 2021-04-24 NOTE — Telephone Encounter (Signed)
rx sent to pharmacy

## 2021-04-24 NOTE — Addendum Note (Signed)
Addended by: Patria Mane A on: 04/24/2021 08:04 AM   Modules accepted: Orders

## 2021-04-25 NOTE — H&P (View-Only) (Signed)
Cardiology Office Note   Date:  04/26/2021   ID:  Adrian Gamble, DOB [birth year], MRN [id number]  PCP:  Adrian Arnt, MD  Cardiologist:   Adrian Breeding, MD   Chief Complaint  Patient presents with  . Chest Pain      History of Present Illness: Adrian Gamble is a 75 y.o. male who presents for follow up of CAD.  He is status post remote PCI to his RCA in 2001, HL, DM2, hypothyroidism, GERD. Cardiac cath in  06/01/12 and demonstrated 3 vessel CAD with preserved LV function. CABG was recommended. He was taken off of Plavix and discharged to home and brought back on 06/09/12. He underwent CABG with Dr. Servando Gamble with grafts including LIMA-LAD, SVG-OM1 and distal circumflex, SVG-distal RCA. Postop course was fairly uneventful.     I talked to him on a telehealth visit last year.  Since that time he has started having chest discomfort.  This has been happening over the last several weeks and getting worse.  It happens with exertion.  He used to be able to cut his small yard without breaks.  Now he has to stop after 10 minutes.  He gets some mid chest aching.  He goes away the rest for 10 minutes.  He has had a progression of this when he is now starting to get some of this if he walks across the room in his house.  He has not had any at rest.  He thinks it does feel reminiscent of his previous angina.  It can be 5 out of 10 in intensity.  He has not had any nitroglycerin.  He has not had any resting exertional shortness of breath, PND or orthopnea.  He has had no palpitations, presyncope or syncope.  He has had no weight gain or edema. walking.      Past Medical History:  Diagnosis Date  . Bilateral primary osteoarthritis of knee 04/27/2020   xrays 04/2020, tricompartmental, worse medially  . CAD (coronary artery disease), s/p MI - s/p PCI to RCA 2001;  LHC 7/13: 3v CAD, good LVF - >  s/p CABG 7/13 (L-LAD, S-OM1/dCFX, S-dRCA)   . Diabetes mellitus type 2, controlled ([place])   . Diverticulosis   .  Early cataracts, bilateral   . GERD 09/01/2007  . H/O hiatal hernia   . HLD (hyperlipidemia), on statin   . Hypothyroidism. on Synthroid   . PUD (peptic ulcer disease)   . Rash 09/04/2020   rash on penis area  . Sleep apnea, patient denies   . 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 Testosterone deficiency     Past Surgical History:  Procedure Laterality Date  . arm surgery  1957   d/t broken  . CARDIAC CATHETERIZATION  2005; 06/01/2012  . COLONOSCOPY  2005  . CORONARY ANGIOPLASTY WITH STENT PLACEMENT  2001   RCA-1 stent  . CORONARY ARTERY BYPASS GRAFT  06/09/2012   Procedure: CORONARY ARTERY BYPASS GRAFTING (CABG);  Surgeon: Grace Isaac, MD;  Location: [facility];  Service: Open Heart Surgery;  Laterality: N/A;  CABG x four;  using left internal mammary artery and right leg greater saphenous vein harvested endoscopically  . ESOPHAGOGASTRODUODENOSCOPY    . PERCUTANEOUS CORONARY STENT INTERVENTION (PCI-S) N/A 06/01/2012   Procedure: PERCUTANEOUS CORONARY STENT INTERVENTION (PCI-S);  Surgeon: Burnell Blanks, MD;  Location: [facility] CATH LAB;  Service: Cardiovascular;  Laterality: N/A;  . [place]   at  age 58     Current Outpatient Medications  Medication Sig Dispense Refill  . aspirin EC 81 MG tablet Take 81 mg by mouth daily.    Marland Kitchen glucose blood (ONETOUCH VERIO) test strip 1 each by Other route in the morning, at noon, and at bedtime. Check blood sugar up to 3 times daily 100 each 0  . levothyroxine (SYNTHROID) 112 MCG tablet Take 1 tablet by mouth once daily 90 tablet 0  . MELATONIN ER PO Take by mouth.    . metFORMIN (GLUCOPHAGE-XR) 500 MG 24 hr tablet Take 1 tablet (500 mg total) by mouth daily with breakfast. 90 tablet 3  . nitroGLYCERIN (NITROSTAT) 0.4 MG SL tablet Place 1 tablet (0.4 mg total) under the tongue every 5 (five) minutes as needed. 25 tablet 3  . rosuvastatin (CRESTOR) 40 MG tablet Take 1 tablet by mouth once daily 90  tablet 3  . Scar Treatment Products [facility]) GEL Apply 1 application topically daily.    . vitamin B-12 (CYANOCOBALAMIN) 100 MCG tablet Take 100 mcg by mouth daily.    . metoprolol tartrate (LOPRESSOR) 25 MG tablet Take 1 tablet (25 mg total) by mouth 2 (two) times daily. 180 tablet 3   No current facility-administered medications for this visit.    Allergies:   Patient has no known allergies.   ROS:  Please see the history of present illness.   Otherwise, review of systems are positive for none.   All other systems are reviewed and negative.    PHYSICAL EXAM: VS:  BP 124/70   Pulse 60   Ht 5\' 7"  (1.702 m)   Wt 195 lb (88.5 kg)   SpO2 98%   BMI 30.54 kg/m  , BMI Body mass index is 30.54 kg/m. GENERAL:  Well appearing NECK:  No jugular venous distention, waveform within normal limits, carotid upstroke brisk and symmetric, right bruits, no thyromegaly LUNGS:  Clear to auscultation bilaterally CHEST:  Well healed sternotomy scar. HEART:  PMI not displaced or sustained,S1 and S2 within normal limits, no S3, no S4, no clicks, no rubs, no murmurs ABD:  Flat, positive bowel sounds normal in frequency in pitch, no bruits, no rebound, no guarding, no midline pulsatile mass, no hepatomegaly, no splenomegaly EXT:  2 plus pulses throughout, no edema, no cyanosis no clubbing   EKG:  EKG is ordered today. The ekg ordered today demonstrates sinus rhythm, rate 60, axis within normal limits, intervals within normal limits, no acute ST-T wave changes.   Recent Labs: 06/19/2020: TSH 1.88    Lipid Panel    Component Value Date/Time   CHOL 131 04/26/2020 0957   TRIG 100.0 04/26/2020 0957   HDL 37.90 (L) 04/26/2020 0957   CHOLHDL 3 04/26/2020 0957   VLDL 20.0 04/26/2020 0957   LDLCALC 73 04/26/2020 0957      Wt Readings from Last 3 Encounters:  04/26/21 195 lb (88.5 kg)  03/13/21 193 lb 12.8 oz (87.9 kg)  12/27/20 194 lb 9.6 oz (88.3 kg)      Other studies Reviewed: Additional  studies/ records that were reviewed today include: Labs. Review of the above records demonstrates:  Please see elsewhere in the note.     ASSESSMENT AND PLAN:  CAD The patient has unstable angina which is progressive exertional.  He does have sublingual nitroglycerin now and he is instructed on how to use it.  She come to the emergency room if he has any symptoms at rest.  I am going to increase  his metoprolol to 25 mg twice daily.  He needs a cardiac catheterization.   The patient understands that risks included but are not limited to stroke (1 in 1000), death (1 in 89), kidney failure [usually temporary] (1 in 500), bleeding (1 in 200), allergic reaction [possibly serious] (1 in 200).  The patient understands and agrees to proceed.   DM His A1C was well controlled but he was on [name] and had urinary problems with this.  I will defer to his primary provider.    Hyperlipidemia LDL was 73 last year.  I am going to have him come back tomorrow for his precath labs and check a lipid profile.   HTN The blood pressure is well controlled other than the increase in beta-blocker as listed.   BRUIT I will schedule a Doppler after the next visit.   Current medicines are reviewed at length with the patient today.  The patient does not have concerns regarding medicines.  The following changes have been made:  no change  Labs/ tests ordered today include:   Orders Placed This Encounter  Procedures  . Basic metabolic panel  . CBC  . Lipid panel  . EKG 12-Lead     Disposition:   FU with me after the cath.     Signed, Adrian Breeding, MD  04/26/2021 3:12 PM    [facility]

## 2021-04-25 NOTE — Progress Notes (Signed)
Cardiology Office Note   Date:  04/26/2021   ID:  Adrian Gamble, DOB 1946/03/02, MRN [id number]  PCP:  Leamon Arnt, MD  Cardiologist:   Minus Breeding, MD   Chief Complaint  Patient presents with  . Chest Pain      History of Present Illness: Adrian Gamble is a 75 y.o. male who presents for follow up of CAD.  He is status post remote PCI to his RCA in 2001, HL, DM2, hypothyroidism, GERD. Cardiac cath in  06/01/12 and demonstrated 3 vessel CAD with preserved LV function. CABG was recommended. He was taken off of Plavix and discharged to home and brought back on 06/09/12. He underwent CABG with Dr. Servando Snare with grafts including LIMA-LAD, SVG-OM1 and distal circumflex, SVG-distal RCA. Postop course was fairly uneventful.     I talked to him on a telehealth visit last year.  Since that time he has started having chest discomfort.  This has been happening over the last several weeks and getting worse.  It happens with exertion.  He used to be able to cut his small yard without breaks.  Now he has to stop after 10 minutes.  He gets some mid chest aching.  He goes away the rest for 10 minutes.  He has had a progression of this when he is now starting to get some of this if he walks across the room in his house.  He has not had any at rest.  He thinks it does feel reminiscent of his previous angina.  It can be 5 out of 10 in intensity.  He has not had any nitroglycerin.  He has not had any resting exertional shortness of breath, PND or orthopnea.  He has had no palpitations, presyncope or syncope.  He has had no weight gain or edema. walking.      Past Medical History:  Diagnosis Date  . Bilateral primary osteoarthritis of knee 04/27/2020   xrays 04/2020, tricompartmental, worse medially  . CAD (coronary artery disease), s/p MI - s/p PCI to RCA 2001;  LHC 7/13: 3v CAD, good LVF - >  s/p CABG 7/13 (L-LAD, S-OM1/dCFX, S-dRCA)   . Diabetes mellitus type 2, controlled ([place])   . Diverticulosis   .  Early cataracts, bilateral   . GERD 09/01/2007  . H/O hiatal hernia   . HLD (hyperlipidemia), on statin   . Hypothyroidism. on Synthroid   . PUD (peptic ulcer disease)   . Rash 09/04/2020   rash on penis area  . Sleep apnea, patient denies   . 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 Testosterone deficiency     Past Surgical History:  Procedure Laterality Date  . arm surgery  1957   d/t broken  . CARDIAC CATHETERIZATION  2005; 06/01/2012  . COLONOSCOPY  2005  . CORONARY ANGIOPLASTY WITH STENT PLACEMENT  2001   RCA-1 stent  . CORONARY ARTERY BYPASS GRAFT  06/09/2012   Procedure: CORONARY ARTERY BYPASS GRAFTING (CABG);  Surgeon: Grace Isaac, MD;  Location: [facility];  Service: Open Heart Surgery;  Laterality: N/A;  CABG x four;  using left internal mammary artery and right leg greater saphenous vein harvested endoscopically  . ESOPHAGOGASTRODUODENOSCOPY    . PERCUTANEOUS CORONARY STENT INTERVENTION (PCI-S) N/A 06/01/2012   Procedure: PERCUTANEOUS CORONARY STENT INTERVENTION (PCI-S);  Surgeon: Burnell Blanks, MD;  Location: [facility] CATH LAB;  Service: Cardiovascular;  Laterality: N/A;  . [place]   at  age 40     Current Outpatient Medications  Medication Sig Dispense Refill  . aspirin EC 81 MG tablet Take 81 mg by mouth daily.    Marland Kitchen glucose blood (ONETOUCH VERIO) test strip 1 each by Other route in the morning, at noon, and at bedtime. Check blood sugar up to 3 times daily 100 each 0  . levothyroxine (SYNTHROID) 112 MCG tablet Take 1 tablet by mouth once daily 90 tablet 0  . MELATONIN ER PO Take by mouth.    . metFORMIN (GLUCOPHAGE-XR) 500 MG 24 hr tablet Take 1 tablet (500 mg total) by mouth daily with breakfast. 90 tablet 3  . nitroGLYCERIN (NITROSTAT) 0.4 MG SL tablet Place 1 tablet (0.4 mg total) under the tongue every 5 (five) minutes as needed. 25 tablet 3  . rosuvastatin (CRESTOR) 40 MG tablet Take 1 tablet by mouth once daily 90  tablet 3  . Scar Treatment Products [facility]) GEL Apply 1 application topically daily.    . vitamin B-12 (CYANOCOBALAMIN) 100 MCG tablet Take 100 mcg by mouth daily.    . metoprolol tartrate (LOPRESSOR) 25 MG tablet Take 1 tablet (25 mg total) by mouth 2 (two) times daily. 180 tablet 3   No current facility-administered medications for this visit.    Allergies:   Patient has no known allergies.   ROS:  Please see the history of present illness.   Otherwise, review of systems are positive for none.   All other systems are reviewed and negative.    PHYSICAL EXAM: VS:  BP 124/70   Pulse 60   Ht 5\' 7"  (1.702 m)   Wt 195 lb (88.5 kg)   SpO2 98%   BMI 30.54 kg/m  , BMI Body mass index is 30.54 kg/m. GENERAL:  Well appearing NECK:  No jugular venous distention, waveform within normal limits, carotid upstroke brisk and symmetric, right bruits, no thyromegaly LUNGS:  Clear to auscultation bilaterally CHEST:  Well healed sternotomy scar. HEART:  PMI not displaced or sustained,S1 and S2 within normal limits, no S3, no S4, no clicks, no rubs, no murmurs ABD:  Flat, positive bowel sounds normal in frequency in pitch, no bruits, no rebound, no guarding, no midline pulsatile mass, no hepatomegaly, no splenomegaly EXT:  2 plus pulses throughout, no edema, no cyanosis no clubbing   EKG:  EKG is ordered today. The ekg ordered today demonstrates sinus rhythm, rate 60, axis within normal limits, intervals within normal limits, no acute ST-T wave changes.   Recent Labs: 06/19/2020: TSH 1.88    Lipid Panel    Component Value Date/Time   CHOL 131 04/26/2020 0957   TRIG 100.0 04/26/2020 0957   HDL 37.90 (L) 04/26/2020 0957   CHOLHDL 3 04/26/2020 0957   VLDL 20.0 04/26/2020 0957   LDLCALC 73 04/26/2020 0957      Wt Readings from Last 3 Encounters:  04/26/21 195 lb (88.5 kg)  03/13/21 193 lb 12.8 oz (87.9 kg)  12/27/20 194 lb 9.6 oz (88.3 kg)      Other studies Reviewed: Additional  studies/ records that were reviewed today include: Labs. Review of the above records demonstrates:  Please see elsewhere in the note.     ASSESSMENT AND PLAN:  CAD The patient has unstable angina which is progressive exertional.  He does have sublingual nitroglycerin now and he is instructed on how to use it.  She come to the emergency room if he has any symptoms at rest.  I am going to increase  his metoprolol to 25 mg twice daily.  He needs a cardiac catheterization.   The patient understands that risks included but are not limited to stroke (1 in 1000), death (1 in 93), kidney failure [usually temporary] (1 in 500), bleeding (1 in 200), allergic reaction [possibly serious] (1 in 200).  The patient understands and agrees to proceed.   DM His A1C was well controlled but he was on [name] and had urinary problems with this.  I will defer to his primary provider.    Hyperlipidemia LDL was 73 last year.  I am going to have him come back tomorrow for his precath labs and check a lipid profile.   HTN The blood pressure is well controlled other than the increase in beta-blocker as listed.   BRUIT I will schedule a Doppler after the next visit.   Current medicines are reviewed at length with the patient today.  The patient does not have concerns regarding medicines.  The following changes have been made:  no change  Labs/ tests ordered today include:   Orders Placed This Encounter  Procedures  . Basic metabolic panel  . CBC  . Lipid panel  . EKG 12-Lead     Disposition:   FU with me after the cath.     Signed, Minus Breeding, MD  04/26/2021 3:12 PM    [facility]

## 2021-04-26 ENCOUNTER — Ambulatory Visit: Payer: Medicare HMO | Admitting: Cardiology

## 2021-04-26 ENCOUNTER — Encounter: Payer: Self-pay | Admitting: Cardiology

## 2021-04-26 ENCOUNTER — Other Ambulatory Visit: Payer: Self-pay

## 2021-04-26 VITALS — BP 124/70 | HR 60 | Ht 67.0 in | Wt 195.0 lb

## 2021-04-26 DIAGNOSIS — Z01818 Encounter for other preprocedural examination: Secondary | ICD-10-CM

## 2021-04-26 DIAGNOSIS — I2510 Atherosclerotic heart disease of native coronary artery without angina pectoris: Secondary | ICD-10-CM | POA: Diagnosis not present

## 2021-04-26 DIAGNOSIS — Z01812 Encounter for preprocedural laboratory examination: Secondary | ICD-10-CM

## 2021-04-26 DIAGNOSIS — E118 Type 2 diabetes mellitus with unspecified complications: Secondary | ICD-10-CM

## 2021-04-26 DIAGNOSIS — I10 Essential (primary) hypertension: Secondary | ICD-10-CM

## 2021-04-26 DIAGNOSIS — E785 Hyperlipidemia, unspecified: Secondary | ICD-10-CM

## 2021-04-26 MED ORDER — METOPROLOL TARTRATE 25 MG PO TABS: 25.0000 mg | ORAL_TABLET | Freq: Two times a day (BID) | ORAL | 3 refills | Status: DC

## 2021-04-26 NOTE — Patient Instructions (Addendum)
Medication Instructions:  INCREASE METOPROLOL TO A WHOLE TABLET (25mg ) TWICE DAILY  *If you need a refill on your cardiac medications before your next appointment, please call your pharmacy*  Lab Work: [place] [facility] If you have labs (blood work) drawn today and your tests are completely normal, you will receive your results only by: Marland Kitchen MyChart Message (if you have MyChart) OR . A paper copy in the mail If you have any lab test that is abnormal or we need to change your treatment, we will call you to review the results.  Testing/Procedures: Your physician has requested that you have a cardiac catheterization. Cardiac catheterization is used to diagnose and/or treat various heart conditions. Doctors may recommend this procedure for a number of different reasons. The most common reason is to evaluate chest pain. Chest pain can be a symptom of coronary artery disease (CAD), and cardiac catheterization can show whether plaque is narrowing or blocking your heart's arteries. This procedure is also used to evaluate the valves, as well as measure the blood flow and oxygen levels in different parts of your heart. For further information please visit [facility]. Please follow instruction sheet, as given.  Follow-Up: At [facility], you and your health needs are our priority.  As part of our continuing mission to provide you with exceptional heart care, we have created designated Provider Care Teams.  These Care Teams include your primary Cardiologist (physician) and Advanced Practice Providers (APPs -  Physician Assistants and Nurse Practitioners) who all work together to provide you with the care you need, when you need it.  Your next appointment:   2 week(s) AFTER HEART CATH   The format for your next appointment:   In Person  Provider:   You may see Minus Breeding, MD or one of the following Advanced Practice Providers on your designated Care Team:    Rosaria Ferries,  PA-C  Jory Sims, DNP, ANP  Other Instructions    [address] [place] [address] Dept: [phone] Loc: [phone]  Adrian Gamble  04/26/2021  You are scheduled for a Cardiac Catheterization on Thursday, June 2 with Dr. Peter Martinique.  1. Please arrive at the [facility] (Main Entrance A) at [facility]: [address] [place],  [address] at 8:30 AM (This time is two hours before your procedure to ensure your preparation). Free valet parking service is available.   Special note: Every effort is made to have your procedure done on time. Please understand that emergencies sometimes delay scheduled procedures.  2. Diet: Do not eat solid foods after midnight.  The patient may have clear liquids until 5am upon the day of the procedure.  3. Labs: You will need to have blood drawn on Friday, May 27 at [facility]  Open: 8am - 5pm (Lunch 12:30 - 1:30)   Phone: [phone]. You do not need to be fasting.  4. Medication instructions in preparation for your procedure:   Contrast Allergy: No  *For reference purposes while preparing patient instructions.   Delete this med list prior to printing instructions for patient.*  Do not take Diabetes Med Glucophage (Metformin) on the day of the procedure and HOLD 48 HOURS AFTER THE PROCEDURE.  On the morning of your procedure, take your Aspirin and any morning medicines NOT listed above.  You may use sips of water.  5. Plan for one night  stay--bring personal belongings. 6. Bring a current list of your medications and current insurance cards. 7. You MUST have a responsible person to drive you home. 8. Someone MUST be with you the first 24 hours after you arrive home or your discharge will be delayed. 9. Please wear clothes that are easy to get on and off and wear slip-on shoes.  Thank  you for allowing [place] to care for you!   -- [name]

## 2021-04-27 DIAGNOSIS — Z01812 Encounter for preprocedural laboratory examination: Secondary | ICD-10-CM | POA: Diagnosis not present

## 2021-04-27 DIAGNOSIS — I2510 Atherosclerotic heart disease of native coronary artery without angina pectoris: Secondary | ICD-10-CM | POA: Diagnosis not present

## 2021-04-27 DIAGNOSIS — I10 Essential (primary) hypertension: Secondary | ICD-10-CM | POA: Diagnosis not present

## 2021-04-27 DIAGNOSIS — E118 Type 2 diabetes mellitus with unspecified complications: Secondary | ICD-10-CM | POA: Diagnosis not present

## 2021-04-27 DIAGNOSIS — E785 Hyperlipidemia, unspecified: Secondary | ICD-10-CM | POA: Diagnosis not present

## 2021-04-27 LAB — BASIC METABOLIC PANEL
BUN/Creatinine Ratio: 16 (ref 10–24)
BUN: 18 mg/dL (ref 8–27)
CO2: 23 mmol/L (ref 20–29)
Calcium: 9.7 mg/dL (ref 8.6–10.2)
Chloride: 101 mmol/L (ref 96–106)
Creatinine, Ser: 1.11 mg/dL (ref 0.76–1.27)
Glucose: 118 mg/dL — ABNORMAL HIGH (ref 65–99)
Potassium: 4.2 mmol/L (ref 3.5–5.2)
Sodium: 140 mmol/L (ref 134–144)
eGFR: 69 mL/min/{1.73_m2} (ref >59)

## 2021-04-27 LAB — LIPID PANEL
Chol/HDL Ratio: 3.5 ratio (ref 0.0–5.0)
Cholesterol, Total: 145 mg/dL (ref 100–199)
HDL: 42 mg/dL (ref >39)
LDL Chol Calc (NIH): 84 mg/dL (ref 0–99)
Triglycerides: 101 mg/dL (ref 0–149)
VLDL Cholesterol Cal: 19 mg/dL (ref 5–40)

## 2021-04-27 LAB — CBC
Hematocrit: 39.5 % (ref 37.5–51.0)
Hemoglobin: 13.3 g/dL (ref 13.0–17.7)
MCH: 31.1 pg (ref 26.6–33.0)
MCHC: 33.7 g/dL (ref 31.5–35.7)
MCV: 92 fL (ref 79–97)
Platelets: 255 10*3/uL (ref 150–450)
RBC: 4.28 x10E6/uL (ref 4.14–5.80)
RDW: 11.9 % (ref 11.6–15.4)
WBC: 4.5 10*3/uL (ref 3.4–10.8)

## 2021-04-30 ENCOUNTER — Encounter: Payer: Medicare HMO | Admitting: Family Medicine

## 2021-05-01 ENCOUNTER — Encounter: Payer: Medicare HMO | Admitting: Family Medicine

## 2021-05-01 ENCOUNTER — Telehealth: Payer: Self-pay

## 2021-05-01 ENCOUNTER — Telehealth: Payer: Self-pay | Admitting: *Deleted

## 2021-05-01 DIAGNOSIS — E785 Hyperlipidemia, unspecified: Secondary | ICD-10-CM

## 2021-05-01 MED ORDER — EZETIMIBE 10 MG PO TABS: 10.0000 mg | ORAL_TABLET | Freq: Every day | ORAL | 3 refills | Status: DC

## 2021-05-01 NOTE — Progress Notes (Incomplete)
Phone: [phone]   Subjective:  Patient presents today for their annual physical. Chief complaint-noted.   See problem oriented charting- ROS- full  review of systems was completed and negative  except for: ***  The following were reviewed and entered/updated in epic: Past Medical History:  Diagnosis Date  . Bilateral primary osteoarthritis of knee 04/27/2020   xrays 04/2020, tricompartmental, worse medially  . CAD (coronary artery disease), s/p MI - s/p PCI to RCA 2001;  LHC 7/13: 3v CAD, good LVF - >  s/p CABG 7/13 (L-LAD, S-OM1/dCFX, S-dRCA)   . Diabetes mellitus type 2, controlled ([place])   . Diverticulosis   . Early cataracts, bilateral   . GERD 09/01/2007  . H/O hiatal hernia   . HLD (hyperlipidemia), on statin   . Hypothyroidism. on Synthroid   . PUD (peptic ulcer disease)   . Rash 09/04/2020   rash on penis area  . Sleep apnea, patient denies   . 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 Testosterone deficiency    Patient Active Problem List   Diagnosis Date Noted  . Bilateral primary osteoarthritis of knee 04/27/2020  . History of MI (myocardial infarction) 04/26/2020  . Hx of CABG 04/26/2020  . 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 Diverticulosis 07/13/2019  . Former smoker 07/13/2019  . Essential hypertension 10/05/2018  . Type 2 diabetes mellitus with complication, without long-term current use of insulin ([place]) 08/12/2016  . Hypothyroidism (acquired) 05/29/2009  . 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 Testosterone deficiency 09/01/2007  . Hyperlipidemia associated with type 2 diabetes mellitus ([place]) 09/01/2007  . CAD (coronary artery disease), native coronary artery 09/01/2007  . GERD 09/01/2007  . Peptic ulcer 09/01/2007   Past Surgical History:  Procedure Laterality Date  . arm surgery  1957   d/t broken  . CARDIAC CATHETERIZATION  2005; 06/01/2012  . COLONOSCOPY  2005  . CORONARY ANGIOPLASTY WITH STENT PLACEMENT   2001   RCA-1 stent  . CORONARY ARTERY BYPASS GRAFT  06/09/2012   Procedure: CORONARY ARTERY BYPASS GRAFTING (CABG);  Surgeon: Grace Isaac, MD;  Location: [facility];  Service: Open Heart Surgery;  Laterality: N/A;  CABG x four;  using left internal mammary artery and right leg greater saphenous vein harvested endoscopically  . ESOPHAGOGASTRODUODENOSCOPY    . PERCUTANEOUS CORONARY STENT INTERVENTION (PCI-S) N/A 06/01/2012   Procedure: PERCUTANEOUS CORONARY STENT INTERVENTION (PCI-S);  Surgeon: Burnell Blanks, MD;  Location[facility] C[facility];  Service: Cardiovascular;  Laterality: N/A;  . [place]   at age 26    Family History  Problem Relation Age of Onset  . Memory loss Father   . Coronary artery disease Father 21       cabg  . Colon cancer Neg Hx     Medications- reviewed and updated Current Outpatient Medications  Medication Sig Dispense Refill  . aspirin EC 81 MG tablet Take 81 mg by mouth daily.    Marland Kitchen glucose blood (ONETOUCH VERIO) test strip 1 each by Other route in the morning, at noon, and at bedtime. Check blood sugar up to 3 times daily 100 each 0  . levothyroxine (SYNTHROID) 112 MCG tablet Take 1 tablet by mouth once daily (Patient taking differently: Take 112 mcg by mouth daily before breakfast.) 90 tablet 0  . Melatonin ER 5 MG TBCR Take 5 mg by mouth at bedtime.    . metFORMIN (GLUCOPHAGE-XR) 500 MG 24 hr tablet Take 1 tablet (  500 mg total) by mouth daily with breakfast. 90 tablet 3  . metoprolol tartrate (LOPRESSOR) 25 MG tablet Take 1 tablet (25 mg total) by mouth 2 (two) times daily. 180 tablet 3  . nitroGLYCERIN (NITROSTAT) 0.4 MG SL tablet Place 1 tablet (0.4 mg total) under the tongue every 5 (five) minutes as needed. 25 tablet 3  . rosuvastatin (CRESTOR) 40 MG tablet Take 1 tablet by mouth once daily (Patient taking differently: Take 40 mg by mouth daily.) 90 tablet 3  . Scar Treatment Products ([place]) GEL Apply 1 application topically 3  (three) times a week.    . vitamin B-12 (CYANOCOBALAMIN) 100 MCG tablet Take 100 mcg by mouth daily.     No current facility-administered medications for this visit.    Allergies-reviewed and updated Allergies  Allergen Reactions  . Oxycontin [Oxycodone] Nausea Only    Any Contin    Social History   Social History Narrative  . Not on file   Objective  Objective:  There were no vitals taken for this visit. Gen: NAD, resting comfortably HEENT: Mucous membranes are moist. Oropharynx normal Neck: no thyromegaly CV: RRR no murmurs rubs or gallops Lungs: CTAB no crackles, wheeze, rhonchi Abdomen: soft/nontender/nondistended/normal bowel sounds. No rebound or guarding.  Ext: no edema Skin: warm, dry Neuro: grossly normal, moves all extremities, PERRLA ***   Assessment and Plan  75 y.o. male presenting for annual physical.  Health Maintenance counseling: 1. Anticipatory guidance: Patient counseled regarding regular dental exams ***q6 months, eye exams ***yearly,  avoiding smoking and second hand smoke*** , limiting alcohol to 2 beverages per day ***.   2. Risk factor reduction:  Advised patient of need for regular exercise and diet rich and fruits and vegetables to reduce risk of heart attack and stroke. Exercise- ***. Diet-***.  Wt Readings from Last 3 Encounters:  04/26/21 195 lb (88.5 kg)  03/13/21 193 lb 12.8 oz (87.9 kg)  12/27/20 194 lb 9.6 oz (88.3 kg)   3. Immunizations/screenings/ancillary studies Immunization History  Administered Date(s) Administered  . Fluad Quad(high Dose 65+) 09/16/2019, 09/05/2020  . Influenza Split 11/01/2011, 09/22/2012  . Influenza Whole 09/22/2009, 09/20/2010  . Influenza, High Dose Seasonal PF 10/03/2015, 11/11/2016, 10/13/2017, 11/20/2018  . Influenza,inj,Quad PF,6+ Mos 09/16/2013, 09/29/2014  . PFIZER(Purple Top)SARS-COV-2 Vaccination 02/15/2020, 03/07/2020, 10/20/2020  . Pneumococcal Conjugate-13 02/25/2014  . Pneumococcal  Polysaccharide-23 09/01/2007, 07/31/2011  . Td 09/01/2007  . Zoster Recombinat (Shingrix) 08/10/2020, 10/10/2020   Health Maintenance Due  Topic Date Due  . TETANUS/TDAP  08/31/2017  . OPHTHALMOLOGY EXAM  12/27/2020  . COVID-19 Vaccine (4 - Booster for Pfizer series) 01/20/2021  . FOOT EXAM  04/26/2021  . URINE MICROALBUMIN  04/26/2021   4. Prostate cancer screening- ***  Lab Results  Component Value Date   PSA 1.24 08/06/2016   PSA 1.21 04/18/2015   PSA 1.15 02/18/2014   5. Colon cancer screening - *** 6. Skin cancer screening- ***advised regular sunscreen use. Denies worrisome, changing, or new skin lesions.  7. *** smoker 8. STD screening - ***  Status of chronic or acute concerns   @SPECCOMM @ *** No diagnosis found.  Recommended follow up: ***No follow-ups on file. Future Appointments  Date Time Provider [place]  05/01/2021  8:00 AM Leamon Arnt, MD [facility]  05/18/2021  9:20 AM Minus Breeding, MD [facility]  09/07/2021 11:45 AM [facility] HEALTH COACH [facility]  12/25/2021  9:00 AM [facility]CM PHARMACIST [facility]    No chief complaint on file.  Lab/Order associations:*** fasting No diagnosis found.  No orders of the defined types were placed in this encounter.   Return precautions advised.  Pamella Pert

## 2021-05-01 NOTE — Telephone Encounter (Signed)
Please advise 

## 2021-05-01 NOTE — Telephone Encounter (Signed)
-----   Message from Minus Breeding, MD sent at 04/29/2021  1:21 PM EDT ----- His LDL is not at target.  I would suggest starting Zetia 10 mg po daily.  Repeat a lipid and liver in 3 months.  Call Mr. Quincy with the results and send results to Leamon Arnt, MD

## 2021-05-01 NOTE — Telephone Encounter (Signed)
Pt scheduled for CPE and diabetes follow up for 05/02/2021 at 2 pm

## 2021-05-01 NOTE — Telephone Encounter (Signed)
Please get patient scheduled for OV to check diabetes.

## 2021-05-01 NOTE — Chronic Care Management (AMB) (Signed)
    Chronic Care Management Pharmacy Assistant   Name: Christpher Stogsdill  MRN: [id number] DOB: 02/28/1946  Reason for Encounter:Chart Review   Medications: Outpatient Encounter Medications as of 05/01/2021  Medication Sig  . aspirin EC 81 MG tablet Take 81 mg by mouth daily.  Marland Kitchen ezetimibe (ZETIA) 10 MG tablet Take 1 tablet (10 mg total) by mouth daily.  Marland Kitchen glucose blood (ONETOUCH VERIO) test strip 1 each by Other route in the morning, at noon, and at bedtime. Check blood sugar up to 3 times daily  . levothyroxine (SYNTHROID) 112 MCG tablet Take 1 tablet by mouth once daily (Patient taking differently: Take 112 mcg by mouth daily before breakfast.)  . Melatonin ER 5 MG TBCR Take 5 mg by mouth at bedtime.  . metFORMIN (GLUCOPHAGE-XR) 500 MG 24 hr tablet Take 1 tablet (500 mg total) by mouth daily with breakfast.  . metoprolol tartrate (LOPRESSOR) 25 MG tablet Take 1 tablet (25 mg total) by mouth 2 (two) times daily.  . nitroGLYCERIN (NITROSTAT) 0.4 MG SL tablet Place 1 tablet (0.4 mg total) under the tongue every 5 (five) minutes as needed.  . rosuvastatin (CRESTOR) 40 MG tablet Take 1 tablet by mouth once daily (Patient taking differently: Take 40 mg by mouth daily.)  . Scar Treatment Products ([place]) GEL Apply 1 application topically 3 (three) times a week.  . vitamin B-12 (CYANOCOBALAMIN) 100 MCG tablet Take 100 mcg by mouth daily.   No facility-administered encounter medications on file as of 05/01/2021.   Reviewed chart for medication changes and adherence.   Recent OV, Consult or Hospital visit:   04/26/21- Minus Breeding, MD ( Cardiology)- seen for follow up of CAD, increased metroprolol from 1/2 tab twice daily  to 1 tab twice daily, follow up after cardiac cath  Recent medication changes indicated:   04/26/21-  increased metroprolol from 1/2 tab twice daily  to 1 tab twice daily  No gaps in adherence identified. Patient has follow up scheduled with pharmacy team. No further action  required.  Wilford Sports CPA, CMA

## 2021-05-01 NOTE — Telephone Encounter (Signed)
Patient had an appt for today 05/01/21 but Dr. Jonni Sanger was out of the office.  Patient could not come in on Friday due to having a heart cath on Thursday.    I have rescheduled patients CPE to next available in September.  Patient would like to know if Dr.Andy would like him to come in for an OV before hand to follow up on diabetes or to get labs.  Patient has also been keeping up with sugar levels the last two weeks.

## 2021-05-01 NOTE — Telephone Encounter (Signed)
pt aware of results  New script sent to the pharmacy  Lab orders mailed to the pt  

## 2021-05-01 NOTE — Telephone Encounter (Signed)
Got it. Yes, let's schedule a diabetes office visit in the next few weeks.  Thank you!

## 2021-05-02 ENCOUNTER — Other Ambulatory Visit: Payer: Self-pay

## 2021-05-02 ENCOUNTER — Ambulatory Visit (INDEPENDENT_AMBULATORY_CARE_PROVIDER_SITE_OTHER): Payer: Medicare HMO | Admitting: Family Medicine

## 2021-05-02 ENCOUNTER — Telehealth: Payer: Self-pay

## 2021-05-02 ENCOUNTER — Encounter: Payer: Self-pay | Admitting: Family Medicine

## 2021-05-02 VITALS — BP 148/98 | HR 63 | Temp 98.2°F | Ht 67.0 in | Wt 194.0 lb

## 2021-05-02 DIAGNOSIS — I10 Essential (primary) hypertension: Secondary | ICD-10-CM | POA: Diagnosis not present

## 2021-05-02 DIAGNOSIS — I25118 Atherosclerotic heart disease of native coronary artery with other forms of angina pectoris: Secondary | ICD-10-CM | POA: Diagnosis not present

## 2021-05-02 DIAGNOSIS — E039 Hypothyroidism, unspecified: Secondary | ICD-10-CM | POA: Diagnosis not present

## 2021-05-02 DIAGNOSIS — E785 Hyperlipidemia, unspecified: Secondary | ICD-10-CM | POA: Diagnosis not present

## 2021-05-02 DIAGNOSIS — E118 Type 2 diabetes mellitus with unspecified complications: Secondary | ICD-10-CM | POA: Diagnosis not present

## 2021-05-02 DIAGNOSIS — Z0000 Encounter for general adult medical examination without abnormal findings: Secondary | ICD-10-CM | POA: Diagnosis not present

## 2021-05-02 DIAGNOSIS — Z79899 Other long term (current) drug therapy: Secondary | ICD-10-CM

## 2021-05-02 DIAGNOSIS — G4752 REM sleep behavior disorder: Secondary | ICD-10-CM

## 2021-05-02 DIAGNOSIS — E1169 Type 2 diabetes mellitus with other specified complication: Secondary | ICD-10-CM | POA: Diagnosis not present

## 2021-05-02 LAB — TSH: TSH: 0.78 u[IU]/mL (ref 0.35–4.50)

## 2021-05-02 LAB — VITAMIN B12: Vitamin B-12: >1550 pg/mL — ABNORMAL HIGH (ref 211–911)

## 2021-05-02 MED ORDER — VANCOMYCIN HCL IN DEXTROSE 1-5 GM/200ML-% IV SOLN
INTRAVENOUS | Status: AC
  Filled 2021-05-02: qty 200

## 2021-05-02 MED ORDER — LIDOCAINE HCL (PF) 1 % IJ SOLN
INTRAMUSCULAR | Status: AC
  Filled 2021-05-02: qty 60

## 2021-05-02 MED ORDER — SODIUM CHLORIDE 0.9 % IV SOLN
INTRAVENOUS | Status: AC
  Filled 2021-05-02: qty 2

## 2021-05-02 NOTE — Telephone Encounter (Addendum)
Attempted to call the pt several times but unable to get passed his "call screener" and have the call connected. Will try again this afternoon.   Pt contacted pre-catheterization scheduled at [facility] for: 05/03/21 Verified arrival time and place: [facility]) at: 8:30 am.   No solid food after midnight prior to cath, clear liquids until 5 AM day of procedure.  CONTRAST ALLERGY:  AM meds can be  taken pre-cath with sips of water including: ASA 81 mg  Hold Metformiin   Confirmed patient has responsible adult to drive home post procedure and be with patient first 24 hours after arriving home:  You are allowed ONE visitor in the waiting room during the time you are at the hospital for your procedure. Both you and your visitor must wear a mask once you enter the hospital.   Patient reports does not currently have any symptoms concerning for COVID-19 and no household members with COVID-19 like illness.

## 2021-05-02 NOTE — Progress Notes (Signed)
Subjective  Chief Complaint  Patient presents with  . Annual Exam    Fasting  . Diabetes  . Hypertension  . Hyperlipidemia    HPI: Adrian Gamble is a 76 y.o. male who presents to [place] [facility] today for a Male Wellness Visit. He also has the concerns and/or needs as listed above in the chief complaint. These will be addressed in addition to the Health Maintenance Visit.   Wellness Visit: annual visit with health maintenance review and exam    HM: due eye exam although he thinks he had one this year. imms are up to date.   Body mass index is 30.38 kg/m. Wt Readings from Last 3 Encounters:  05/02/21 194 lb (88 kg)  04/26/21 195 lb (88.5 kg)  03/13/21 193 lb 12.8 oz (87.9 kg)     Chronic disease management visit and/or acute problem visit:  CAD with angina. Reviewed cardiology notes. For cardiac cath on Friday. On increased bb and has ntg if needed.   DM: no longer with balanitis since stopping farxiga. fastings are on avg 120s. Checking bid. On low dose metformin. Feels ok w/o sxs of hyperglycemia or neuropathy.   HTN and HLD: reviewed recent bmp and lipid panel. LDL is above goal on crestor 20; cards added zetia but cost may be an issue. bp has been well controlled. Elevated today: ? Stress response. Metoprolol dose was increased. He is not on an ace or arb  Low thyroid on meds and feels well. Due for recheck.   Denies mood problems or falls  Patient Active Problem List   Diagnosis Date Noted  . History of MI (myocardial infarction) 04/26/2020  . Hx of CABG 04/26/2020  . 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 Essential hypertension 10/05/2018  . Type 2 diabetes mellitus with complication, without long-term current use of insulin ([place]) 08/12/2016  . Hypothyroidism (acquired) 05/29/2009  . Hyperlipidemia associated with type 2 diabetes mellitus ([place]) 09/01/2007  . CAD (coronary artery disease), native coronary artery 09/01/2007  . Bilateral  primary osteoarthritis of knee 04/27/2020  . Diverticulosis 07/13/2019  . GERD 09/01/2007  . Peptic ulcer 09/01/2007  . Former smoker 07/13/2019  . Testosterone deficiency 09/01/2007  . Brachial neuritis or radiculitis, right 02/20/2009   Health Maintenance  Topic Date Due  . TETANUS/TDAP  08/31/2017  . OPHTHALMOLOGY EXAM  12/27/2020  . COVID-19 Vaccine (4 - Booster for Pfizer series) 01/20/2021  . FOOT EXAM  04/26/2021  . URINE MICROALBUMIN  04/26/2021  . INFLUENZA VACCINE  07/02/2021  . HEMOGLOBIN A1C  09/12/2021  . Hepatitis C Screening  Completed  . PNA vac Low Risk Adult  Completed  . Zoster Vaccines- Shingrix  Completed  . HPV VACCINES  Aged Out   Immunization History  Administered Date(s) Administered  . Fluad Quad(high Dose 65+) 09/16/2019, 09/05/2020  . Influenza Split 11/01/2011, 09/22/2012  . Influenza Whole 09/22/2009, 09/20/2010  . Influenza, High Dose Seasonal PF 10/03/2015, 11/11/2016, 10/13/2017, 11/20/2018  . Influenza,inj,Quad PF,6+ Mos 09/16/2013, 09/29/2014  . PFIZER(Purple Top)SARS-COV-2 Vaccination 02/15/2020, 03/07/2020, 10/20/2020  . Pneumococcal Conjugate-13 02/25/2014  . Pneumococcal Polysaccharide-23 09/01/2007, 07/31/2011  . Td 09/01/2007  . Zoster Recombinat (Shingrix) 08/10/2020, 10/10/2020   We updated and reviewed the patient's past history in detail and it is documented below. Allergies: Patient is allergic to oxycontin [oxycodone]. Past Medical History  has a past medical history of Bilateral primary osteoarthritis of knee (04/27/2020), CAD (coronary artery disease), s/p MI - s/p PCI to RCA  2001;  [place] 7/13: 3v CAD, good LVF - >  s/p CABG 7/13 (L-LAD, S-OM1/dCFX, S-dRCA), Diabetes mellitus type 2, controlled ([place]), Diverticulosis, Early cataracts, bilateral, GERD (09/01/2007), H/O hiatal hernia, HLD (hyperlipidemia), on statin, Hypothyroidism. on Synthroid, PUD (peptic ulcer disease), Rash (09/04/2020), Sleep apnea, patient denies, Sleep behavior  disorder, REM (09/16/2019), and Testosterone deficiency. Past Surgical History Patient  has a past surgical history that includes Cardiac catheterization (2005; 06/01/2012); Tonsillectomy and adenoidectomy (1955); arm surgery (6144); Colonoscopy (2005); Esophagogastroduodenoscopy; Coronary angioplasty with stent (2001); Coronary artery bypass graft (06/09/2012); and percutaneous coronary stent intervention (pci-s) (N/A, 06/01/2012). Social History Patient  reports that he quit smoking about 8 years ago. His smoking use included pipe and cigars. He has never used smokeless tobacco. He reports current alcohol use. He reports that he does not use drugs. Family History family history includes Coronary artery disease (age of onset: 62) in his father; Memory loss in his father. Review of Systems: Constitutional: negative for fever or malaise Ophthalmic: negative for photophobia, double vision or loss of vision Cardiovascular: POSITIVE for chest pain on exertion, no new LE swelling Respiratory: negative for SOB or persistent cough Gastrointestinal: negative for abdominal pain, change in bowel habits or melena Genitourinary: negative for dysuria or gross hematuria Musculoskeletal: negative for new gait disturbance or muscular weakness Integumentary: negative for new or persistent rashes Neurological: negative for TIA or stroke symptoms Psychiatric: negative for SI or delusions Allergic/Immunologic: negative for hives  Patient Care Team    Relationship Specialty Notifications Start End  Leamon Arnt, MD PCP - General Family Medicine  09/16/19   Minus Breeding, MD PCP - Cardiology Cardiology Admissions 11/05/19   Grace Isaac, MD (Inactive) Consulting Physician Cardiothoracic Surgery  06/02/12   Allyn Kenner, MD  Dermatology  09/16/19   Minus Breeding, MD Consulting Physician Cardiology  09/16/19   Dohmeier, Asencion Partridge, MD Consulting Physician Neurology  04/26/20   Madelin Rear, [facility] Pharmacist  Pharmacist  03/20/21    Comment: Phone [phone]   Objective  Vitals: BP (!) 148/98   Pulse 63   Temp 98.2 F (36.8 C) (Temporal)   Ht _0  (1.702 m)   Wt 194 lb (88 kg)   SpO2 95%   BMI 30.38 kg/m  General:  Well developed, well nourished, no acute distress  Psych:  Alert and orientedx3, flat mood and affect today HEENT:  Normocephalic, atraumatic, non-icteric sclera,  supple neck without adenopathy, mass or thyromegaly Cardiovascular:  Normal S1, S2, RRR without gallop, rub or murmur,  +2 distal pulses in bilateral upper and lower extremities. Respiratory:  Good breath sounds bilaterally, CTAB with normal respiratory effort Gastrointestinal: normal bowel sounds, soft, non-tender, no noted masses. No HSM MSK: no deformities, contusions. Joints are without erythema or swelling. Spine and CVA region are nontender Skin:  Warm, no rashes or suspicious lesions noted Neurologic:    Mental status is normal. CN 2-11 are normal. Gross motor and sensory exams are normal. Stable gait. No tremor GU: No inguinal hernias or adenopathy are appreciated bilaterally   Diabetic Foot Exam - Simple   Simple Foot Form Diabetic Foot exam was performed with the following findings: Yes 05/02/2021  2:34 PM  Visual Inspection No deformities, no ulcerations, no other skin breakdown bilaterally: Yes Sensation Testing Intact to touch and monofilament testing bilaterally: Yes Pulse Check Posterior Tibialis and Dorsalis pulse intact bilaterally: Yes Comments     Assessment  1. Annual physical exam   2. Coronary artery disease with exertional angina ([place])  3. Essential hypertension   4. Type 2 diabetes mellitus with complication, without long-term current use of insulin ([place])   5. Hypothyroidism (acquired)   6. Hyperlipidemia associated with type 2 diabetes mellitus ([place])   7. Sleep behavior disorder, REM   8. Long-term current use of proton pump inhibitor therapy      Plan  Male Wellness  Visit:  Age appropriate Health Maintenance and Prevention measures were discussed with patient. Included topics are cancer screening recommendations, ways to keep healthy (see AVS) including dietary and exercise recommendations, regular eye and dental care, use of seat belts, and avoidance of moderate alcohol use and tobacco use.   BMI: discussed patient's BMI and encouraged positive lifestyle modifications to help get to or maintain a target BMI.  HM needs and immunizations were addressed and ordered. See below for orders. See HM and immunization section for updates.  Routine labs and screening tests ordered including cmp, cbc and lipids where appropriate.  Discussed recommendations regarding Vit D and calcium supplementation (see AVS)  Chronic disease f/u and/or acute problem visit: (deemed necessary to be done in addition to the wellness visit):  Cad with angina; for cath. See cards notes.  Check thyroid; clinically stable  HLD: crestor 40 and added zetia  HTN is typically controlled. Will recheck next visit. ? Stress induced  Chronic ppi; check b12 levels. gerd is controlled.   Dm: doing ok on low dose metformin. Need urine MAC - ? Need for ace.    Follow up: 8 weeks for dm f/u   Commons side effects, risks, benefits, and alternatives for medications and treatment plan prescribed today were discussed, and the patient expressed understanding of the given instructions. Patient is instructed to call or message via MyChart if he/she has any questions or concerns regarding our treatment plan. No barriers to understanding were identified. We discussed Red Flag symptoms and signs in detail. Patient expressed understanding regarding what to do in case of urgent or emergency type symptoms.   Medication list was reconciled, printed and provided to the patient in AVS. Patient instructions and summary information was reviewed with the patient as documented in the AVS. This note was prepared  with assistance of Dragon voice recognition software. Occasional wrong-word or sound-a-like substitutions may have occurred due to the inherent limitations of voice recognition software  This visit occurred during the SARS-CoV-2 public health emergency.  Safety protocols were in place, including screening questions prior to the visit, additional usage of staff PPE, and extensive cleaning of exam room while observing appropriate contact time as indicated for disinfecting solutions.   Orders Placed This Encounter  Procedures  . Hepatic function panel  . TSH  . Vitamin B12  . Microalbumin / creatinine urine ratio   No orders of the defined types were placed in this encounter.

## 2021-05-02 NOTE — Patient Instructions (Signed)
Please return in 6-8 weeks to recheck diabetes.  Please check to see when your last eye exam was; Dr. Nicki Reaper can send [place] the report.  Good luck on your cardiac catheterization. May it all go well.  If you have any questions or concerns, please don't hesitate to send me a message via MyChart or call the office at [phone]. Thank you for visiting with [place] today! It's our pleasure caring for you.

## 2021-05-03 ENCOUNTER — Other Ambulatory Visit: Payer: Self-pay

## 2021-05-03 ENCOUNTER — Ambulatory Visit (HOSPITAL_COMMUNITY)
Admission: RE | Admit: 2021-05-03 | Discharge: 2021-05-03 | Disposition: A | Discharge status: Home / Self Care | Payer: Medicare HMO | Attending: Cardiology | Admitting: Cardiology

## 2021-05-03 ENCOUNTER — Encounter (HOSPITAL_COMMUNITY)
Admission: RE | Disposition: A | Discharge status: Home / Self Care | Payer: Self-pay | Source: Home / Self Care | Attending: Cardiology

## 2021-05-03 DIAGNOSIS — R0989 Other specified symptoms and signs involving the circulatory and respiratory systems: Secondary | ICD-10-CM | POA: Diagnosis not present

## 2021-05-03 DIAGNOSIS — E118 Type 2 diabetes mellitus with unspecified complications: Secondary | ICD-10-CM

## 2021-05-03 DIAGNOSIS — I25119 Atherosclerotic heart disease of native coronary artery with unspecified angina pectoris: Secondary | ICD-10-CM

## 2021-05-03 DIAGNOSIS — I10 Essential (primary) hypertension: Secondary | ICD-10-CM | POA: Diagnosis present

## 2021-05-03 DIAGNOSIS — Z7982 Long term (current) use of aspirin: Secondary | ICD-10-CM | POA: Diagnosis not present

## 2021-05-03 DIAGNOSIS — Z7984 Long term (current) use of oral hypoglycemic drugs: Secondary | ICD-10-CM | POA: Insufficient documentation

## 2021-05-03 DIAGNOSIS — E1159 Type 2 diabetes mellitus with other circulatory complications: Secondary | ICD-10-CM | POA: Diagnosis present

## 2021-05-03 DIAGNOSIS — E785 Hyperlipidemia, unspecified: Secondary | ICD-10-CM | POA: Diagnosis not present

## 2021-05-03 DIAGNOSIS — Z79890 Hormone replacement therapy (postmenopausal): Secondary | ICD-10-CM | POA: Insufficient documentation

## 2021-05-03 DIAGNOSIS — E119 Type 2 diabetes mellitus without complications: Secondary | ICD-10-CM | POA: Diagnosis not present

## 2021-05-03 DIAGNOSIS — Z79899 Other long term (current) drug therapy: Secondary | ICD-10-CM | POA: Insufficient documentation

## 2021-05-03 DIAGNOSIS — E1169 Type 2 diabetes mellitus with other specified complication: Secondary | ICD-10-CM | POA: Diagnosis present

## 2021-05-03 DIAGNOSIS — I25110 Atherosclerotic heart disease of native coronary artery with unstable angina pectoris: Secondary | ICD-10-CM | POA: Insufficient documentation

## 2021-05-03 DIAGNOSIS — I200 Unstable angina: Secondary | ICD-10-CM | POA: Diagnosis present

## 2021-05-03 DIAGNOSIS — Z951 Presence of aortocoronary bypass graft: Secondary | ICD-10-CM | POA: Diagnosis not present

## 2021-05-03 DIAGNOSIS — I152 Hypertension secondary to endocrine disorders: Secondary | ICD-10-CM | POA: Diagnosis present

## 2021-05-03 HISTORY — PX: LEFT HEART CATH AND CORONARY ANGIOGRAPHY: CATH118249

## 2021-05-03 LAB — GLUCOSE, CAPILLARY: Glucose-Capillary: 121 mg/dL — ABNORMAL HIGH (ref 70–99)

## 2021-05-03 SURGERY — LEFT HEART CATH AND CORONARY ANGIOGRAPHY
Anesthesia: LOCAL

## 2021-05-03 MED ORDER — MIDAZOLAM HCL 2 MG/2ML IJ SOLN
INTRAMUSCULAR | Status: DC | PRN
  Administered 2021-05-03: 1 mg via INTRAVENOUS

## 2021-05-03 MED ORDER — SODIUM CHLORIDE 0.9 % WEIGHT BASED INFUSION: 1.0000 mL/kg/h | INTRAVENOUS | Status: DC

## 2021-05-03 MED ORDER — SODIUM CHLORIDE 0.9% FLUSH: 3.0000 mL | Freq: Two times a day (BID) | INTRAVENOUS | Status: DC

## 2021-05-03 MED ORDER — SODIUM CHLORIDE 0.9 % WEIGHT BASED INFUSION
3.0000 mL/kg/h | INTRAVENOUS | Status: AC
  Administered 2021-05-03: 3 mL/kg/h via INTRAVENOUS

## 2021-05-03 MED ORDER — HEPARIN (PORCINE) IN NACL 1000-0.9 UT/500ML-% IV SOLN
INTRAVENOUS | Status: AC
  Filled 2021-05-03: qty 500

## 2021-05-03 MED ORDER — IOHEXOL 350 MG/ML SOLN
INTRAVENOUS | Status: DC | PRN
  Administered 2021-05-03: 80 mL via INTRA_ARTERIAL

## 2021-05-03 MED ORDER — FENTANYL CITRATE (PF) 100 MCG/2ML IJ SOLN
INTRAMUSCULAR | Status: AC
  Filled 2021-05-03: qty 2

## 2021-05-03 MED ORDER — ONDANSETRON HCL 4 MG/2ML IJ SOLN: 4.0000 mg | Freq: Four times a day (QID) | INTRAMUSCULAR | Status: DC | PRN

## 2021-05-03 MED ORDER — ASPIRIN 81 MG PO CHEW: 81.0000 mg | CHEWABLE_TABLET | ORAL | Status: AC

## 2021-05-03 MED ORDER — VERAPAMIL HCL 2.5 MG/ML IV SOLN
INTRAVENOUS | Status: DC | PRN
  Administered 2021-05-03: 10 mL via INTRA_ARTERIAL

## 2021-05-03 MED ORDER — SODIUM CHLORIDE 0.9 % IV SOLN: 250.0000 mL | INTRAVENOUS | Status: DC | PRN

## 2021-05-03 MED ORDER — LIDOCAINE HCL (PF) 1 % IJ SOLN
INTRAMUSCULAR | Status: AC
  Filled 2021-05-03: qty 30

## 2021-05-03 MED ORDER — HEPARIN (PORCINE) IN NACL 1000-0.9 UT/500ML-% IV SOLN
INTRAVENOUS | Status: DC | PRN
  Administered 2021-05-03 (×2): 500 mL

## 2021-05-03 MED ORDER — SODIUM CHLORIDE 0.9% FLUSH: 3.0000 mL | INTRAVENOUS | Status: DC | PRN

## 2021-05-03 MED ORDER — METFORMIN HCL ER 500 MG PO TB24: 500.0000 mg | ORAL_TABLET | Freq: Every day | ORAL | 3 refills | Status: DC

## 2021-05-03 MED ORDER — MIDAZOLAM HCL 2 MG/2ML IJ SOLN
INTRAMUSCULAR | Status: AC
  Filled 2021-05-03: qty 2

## 2021-05-03 MED ORDER — ACETAMINOPHEN 325 MG PO TABS: 650.0000 mg | ORAL_TABLET | ORAL | Status: DC | PRN

## 2021-05-03 MED ORDER — FENTANYL CITRATE (PF) 100 MCG/2ML IJ SOLN
INTRAMUSCULAR | Status: DC | PRN
  Administered 2021-05-03: 25 ug via INTRAVENOUS

## 2021-05-03 MED ORDER — ASPIRIN 81 MG PO CHEW
CHEWABLE_TABLET | ORAL | Status: AC
  Administered 2021-05-03: 81 mg via ORAL
  Filled 2021-05-03: qty 1

## 2021-05-03 MED ORDER — VERAPAMIL HCL 2.5 MG/ML IV SOLN
INTRAVENOUS | Status: AC
  Filled 2021-05-03: qty 2

## 2021-05-03 MED ORDER — HEPARIN SODIUM (PORCINE) 1000 UNIT/ML IJ SOLN
INTRAMUSCULAR | Status: DC | PRN
  Administered 2021-05-03: 4500 [IU] via INTRAVENOUS

## 2021-05-03 MED ORDER — LIDOCAINE HCL (PF) 1 % IJ SOLN
INTRAMUSCULAR | Status: DC | PRN
  Administered 2021-05-03: 2 mL via INTRADERMAL

## 2021-05-03 SURGICAL SUPPLY — 11 items
CATH INFINITI 5 FR IM (CATHETERS) ×2 IMPLANT
CATH INFINITI 5FR MULTPACK ANG (CATHETERS) ×2 IMPLANT
GLIDESHEATH SLEND SS 6F .021 (SHEATH) ×2 IMPLANT
GUIDEWIRE INQWIRE 1.5J.035X260 (WIRE) ×1 IMPLANT
INQWIRE 1.5J .035X260CM (WIRE) ×2
KIT HEART LEFT (KITS) ×2 IMPLANT
PACK CARDIAC CATHETERIZATION (CUSTOM PROCEDURE TRAY) ×2 IMPLANT
TRANSDUCER W/STOPCOCK (MISCELLANEOUS) ×2 IMPLANT
TUBING ART PRESS 72  MALE/FEM (TUBING) ×2
TUBING ART PRESS 72 MALE/FEM (TUBING) ×1 IMPLANT
TUBING CIL FLEX 10 FLL-RA (TUBING) ×2 IMPLANT

## 2021-05-03 NOTE — Discharge Instructions (Signed)
Radial Site Care  This sheet gives you information about how to care for yourself after your procedure. Your health care provider may also give you more specific instructions. If you have problems or questions, contact your health care provider. What can I expect after the procedure? After the procedure, it is common to have:  Bruising and tenderness at the catheter insertion area. Follow these instructions at home: Medicines  Take over-the-counter and prescription medicines only as told by your health care provider. Insertion site care  Follow instructions from your health care provider about how to take care of your insertion site. Make sure you: ? Wash your hands with soap and water before you change your bandage (dressing). If soap and water are not available, use hand sanitizer. ? Change your dressing as told by your health care provider. ? Leave stitches (sutures), skin glue, or adhesive strips in place. These skin closures may need to stay in place for 2 weeks or longer. If adhesive strip edges start to loosen and curl up, you may trim the loose edges. Do not remove adhesive strips completely unless your health care provider tells you to do that.  Check your insertion site every day for signs of infection. Check for: ? Redness, swelling, or pain. ? Fluid or blood. ? Pus or a bad smell. ? Warmth.  Do not take baths, swim, or use a hot tub until your health care provider approves.  You may shower 24-48 hours after the procedure, or as directed by your health care provider. ? Remove the dressing and gently wash the site with plain soap and water. ? Pat the area dry with a clean towel. ? Do not rub the site. That could cause bleeding.  Do not apply powder or lotion to the site. Activity  For 24 hours after the procedure, or as directed by your health care provider: ? Do not flex or bend the affected arm. ? Do not push or pull heavy objects with the affected arm. ? Do not drive  yourself home from the hospital or clinic. You may drive 24 hours after the procedure unless your health care provider tells you not to. ? Do not operate machinery or power tools.  Do not lift anything that is heavier than 10 lb (4.5 kg), or the limit that you are told, until your health care provider says that it is safe.  Ask your health care provider when it is okay to: ? Return to work or school. ? Resume usual physical activities or sports. ? Resume sexual activity.   General instructions  If the catheter site starts to bleed, raise your arm and put firm pressure on the site. If the bleeding does not stop, get help right away. This is a medical emergency.  If you went home on the same day as your procedure, a responsible adult should be with you for the first 24 hours after you arrive home.  Keep all follow-up visits as told by your health care provider. This is important. Contact a health care provider if:  You have a fever.  You have redness, swelling, or yellow drainage around your insertion site. Get help right away if:  You have unusual pain at the radial site.  The catheter insertion area swells very fast.  The insertion area is bleeding, and the bleeding does not stop when you hold steady pressure on the area.  Your arm or hand becomes pale, cool, tingly, or numb. These symptoms may represent a serious   problem that is an emergency. Do not wait to see if the symptoms will go away. Get medical help right away. Call your local emergency services (911 in the [place] Do not drive yourself to the hospital. Summary  After the procedure, it is common to have bruising and tenderness at the site.  Follow instructions from your health care provider about how to take care of your radial site wound. Check the wound every day for signs of infection.  Do not lift anything that is heavier than 10 lb (4.5 kg), or the limit that you are told, until your health care provider says that it  is safe. This information is not intended to replace advice given to you by your health care provider. Make sure you discuss any questions you have with your health care provider. Document Revised: 12/24/2017 Document Reviewed: 12/24/2017 Elsevier Patient Education  2021 Elsevier Inc.  

## 2021-05-03 NOTE — Interval H&P Note (Signed)
History and Physical Interval Note:  05/03/2021 10:29 AM  Adrian Gamble  has presented today for surgery, with the diagnosis of unstable angina.  The various methods of treatment have been discussed with the patient and family. After consideration of risks, benefits and other options for treatment, the patient has consented to  Procedure(s): LEFT HEART CATH AND CORONARY ANGIOGRAPHY (N/A) as a surgical intervention.  The patient's history has been reviewed, patient examined, no change in status, stable for surgery.  I have reviewed the patient's chart and labs.  Questions were answered to the patient's satisfaction.   Cath Lab Visit (complete for each Cath Lab visit)  Clinical Evaluation Leading to the Procedure:   ACS: Yes.    Non-ACS:    Anginal Classification: CCS III  Anti-ischemic medical therapy: Minimal Therapy (1 class of medications)  Non-Invasive Test Results: No non-invasive testing performed  Prior CABG: Previous CABG        Adrian Gamble 05/03/2021 10:30 AM

## 2021-05-04 ENCOUNTER — Encounter (HOSPITAL_COMMUNITY): Payer: Self-pay | Admitting: Cardiology

## 2021-05-04 LAB — HEPATIC FUNCTION PANEL
ALT: 12 U/L (ref 0–53)
AST: 18 U/L (ref 0–37)
Albumin: 4.5 g/dL (ref 3.5–5.2)
Alkaline Phosphatase: 57 U/L (ref 39–117)
Bilirubin, Direct: 0.1 mg/dL (ref 0.0–0.3)
Total Bilirubin: 0.4 mg/dL (ref 0.2–1.2)
Total Protein: 7.6 g/dL (ref 6.0–8.3)

## 2021-05-10 ENCOUNTER — Telehealth: Payer: Self-pay

## 2021-05-10 NOTE — Progress Notes (Addendum)
Chronic Care Management Pharmacy Assistant   Name: Adrian Gamble  MRN: [id number] DOB: 06-Jul-1946  Reason for Encounter:Diabetes Mellitus Disease State call   Recent office visits:  05/02/21-Camille Jonni Sanger MD (PCP)- Office visit for annual physical. Labs ordered.  No medications.  Follow up in 6-8 weeks.  Recent consult visits:  05/03/21-Peter Martinique MD (Cardiology)-Office visit for Procedure (Left heart cath and coronary angiography) No medication changes. Follow up as scheduled.   Hospital visits:  Medication Reconciliation was completed by comparing discharge summary, patient's EMR and Pharmacy list, and upon discussion with patient.  Admitted to the hospital on 05/03/21 due to Unstable Angina. Discharge date was 05/03/21. Discharged from [facility]?Medications Started at [facility] Discharge:?? -started None   Medication Changes at Hospital Discharge: -Changed None  Medications Discontinued at Hospital Discharge: -Stopped None  Medications that remain the same after Hospital Discharge:??  -All other medications will remain the same.    Medications: Outpatient Encounter Medications as of 05/10/2021  Medication Sig Note   aspirin EC 81 MG tablet Take 81 mg by mouth daily.    ezetimibe (ZETIA) 10 MG tablet Take 1 tablet (10 mg total) by mouth daily. 05/03/2021: Not started yet   glucose blood (ONETOUCH VERIO) test strip 1 each by Other route in the morning, at noon, and at bedtime. Check blood sugar up to 3 times daily    levothyroxine (SYNTHROID) 112 MCG tablet Take 1 tablet by mouth once daily (Patient taking differently: Take 112 mcg by mouth daily before breakfast.)    Melatonin ER 5 MG TBCR Take 5 mg by mouth at bedtime.    metFORMIN (GLUCOPHAGE-XR) 500 MG 24 hr tablet Take 1 tablet (500 mg total) by mouth daily with breakfast.    metoprolol tartrate (LOPRESSOR) 25 MG tablet Take 1 tablet (25 mg total) by mouth 2 (two) times daily.    nitroGLYCERIN  (NITROSTAT) 0.4 MG SL tablet Place 1 tablet (0.4 mg total) under the tongue every 5 (five) minutes as needed.    rosuvastatin (CRESTOR) 40 MG tablet Take 1 tablet by mouth once daily (Patient taking differently: Take 40 mg by mouth daily.)    Scar Treatment Products (MEDERMA) GEL Apply 1 application topically 3 (three) times a week.    vitamin B-12 (CYANOCOBALAMIN) 100 MCG tablet Take 100 mcg by mouth daily.    No facility-administered encounter medications on file as of 05/10/2021.   Recent Relevant Labs: Lab Results  Component Value Date/Time   HGBA1C 5.8 (A) 03/13/2021 11:07 AM   HGBA1C 5.8 (A) 12/27/2020 01:43 PM   HGBA1C 6.7 (H) 08/13/2019 02:01 PM   HGBA1C 6.1 07/07/2017 08:58 AM   MICROALBUR 15.8 (H) 04/26/2020 12:20 PM   MICROALBUR 21.4 (H) 09/16/2019 11:33 AM    Kidney Function Lab Results  Component Value Date/Time   CREATININE 1.11 04/27/2021 08:44 AM   CREATININE 1.05 04/26/2020 09:57 AM   GFR 68.74 04/26/2020 09:57 AM   GFRNONAA 87 (L) 06/12/2012 06:00 AM   GFRAA >90 06/12/2012 06:00 AM   Patient wanted to inform Dr Jonni Sanger that his last eye exam was January 2021 and has one coming up 08/2021.    Current antihyperglycemic regimen:  Metformin 500mg  take 1 daily What recent interventions/DTPs have been made to improve glycemic control:  None Have there been any recent hospitalizations or ED visits since last visit with CPP? Yes Patient denies hypoglycemic symptoms, including Pale, Sweaty, Shaky, Hungry, Nervous/irritable, and Vision changes Patient denies hyperglycemic symptoms,  including blurry vision, excessive thirst, fatigue, polyuria, and weakness How often are you checking your blood sugar? once daily What are your blood sugars ranging?  Fasting: n/a Before meals: n/a After meals: 134 Bedtime: n/a During the week, how often does your blood glucose drop below 70? Never Are you checking your feet daily/regularly? Yes  Adherence Review: Is the patient currently on  a STATIN medication? Yes Is the patient currently on ACE/ARB medication? No Does the patient have >5 day gap between last estimated fill dates? No  Care Gap:  Last eye exam 12/2019, next one 08/2021.  Star Rating Drugs:  Metformin 500mg   Last filled 02/21/21  90 DS Rosuvastatin 40mg   Last filled 02/07/21  90DS

## 2021-05-17 DIAGNOSIS — R0989 Other specified symptoms and signs involving the circulatory and respiratory systems: Secondary | ICD-10-CM | POA: Insufficient documentation

## 2021-05-17 NOTE — Progress Notes (Signed)
Cardiology Office Note   Date:  05/18/2021   ID:  Adrian Gamble, DOB 1946-10-31, MRN [id number]  PCP:  Leamon Arnt, MD  Cardiologist:   Minus Breeding, MD   Chief Complaint  Patient presents with   Chest Pain       History of Present Illness: Adrian Gamble is a 75 y.o. male who presents for follow up of CAD.  He is status post remote PCI to his RCA in 2001, HL, DM2, hypothyroidism, GERD. Cardiac cath in  06/01/12 and demonstrated 3 vessel CAD with preserved LV function. CABG was recommended. He was taken off of Plavix and discharged to home and brought back on 06/09/12. He underwent CABG with Dr. Servando Snare with grafts including LIMA-LAD, SVG-OM1 and distal circumflex, SVG-distal RCA. Postop course was fairly uneventful.     At the last visit he had chest pain.  I sent him for a cath with results as below.  He is still getting the chest discomfort when he does something like pushing a lawnmower for several minutes.  He actually had some chest discomfort after he got back and got into bed last night after going to the bathroom.  He does think it is different than his reflux but he does have reflux and a hiatal hernia.  He has not taken any nitroglycerin.  Its not worse than it was.  He is not having any new shortness of breath, PND or orthopnea.  Has had no new palpitations, presyncope or syncope.   Past Medical History:  Diagnosis Date   Bilateral primary osteoarthritis of knee 04/27/2020   xrays 04/2020, tricompartmental, worse medially   CAD (coronary artery disease), s/p MI - s/p PCI to RCA 2001;  [place] 7/13: 3v CAD, good LVF - >  s/p CABG 7/13 (L-LAD, S-OM1/dCFX, S-dRCA)    Diabetes mellitus type 2, controlled ([place])    Diverticulosis    Early cataracts, bilateral    GERD 09/01/2007   H/O hiatal hernia    HLD (hyperlipidemia), on statin    Hypothyroidism. 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   REM disorder   Testosterone deficiency     Past Surgical History:  Procedure Laterality Date   arm surgery  1957   d/t broken   CARDIAC CATHETERIZATION  2005; 06/01/2012   COLONOSCOPY  2005   CORONARY ANGIOPLASTY WITH STENT PLACEMENT  2001   RCA-1 stent   CORONARY ARTERY BYPASS GRAFT  06/09/2012   Procedure: CORONARY ARTERY BYPASS GRAFTING (CABG);  Surgeon: Grace Isaac, MD;  Location: [facility];  Service: Open Heart Surgery;  Laterality: N/A;  CABG x four;  using left internal mammary artery and right leg greater saphenous vein harvested endoscopically   ESOPHAGOGASTRODUODENOSCOPY     LEFT HEART CATH AND CORONARY ANGIOGRAPHY N/A 05/03/2021   Procedure: LEFT HEART CATH AND CORONARY ANGIOGRAPHY;  Surgeon: Martinique, Peter M, MD;  Location: [facility];  Service: Cardiovascular;  Laterality: N/A;   PERCUTANEOUS CORONARY STENT INTERVENTION (PCI-S) N/A 06/01/2012   Procedure: PERCUTANEOUS CORONARY STENT INTERVENTION (PCI-S);  Surgeon: Burnell Blanks, MD;  Location: [facility] CATH LAB;  Service: Cardiovascular;  Laterality: N/A;   TONSILLECTOMY AND ADENOIDECTOMY  1955   at age 66     Current Outpatient Medications  Medication Sig Dispense Refill   aspirin EC 81 MG tablet  Take 81 mg by mouth daily.     ezetimibe (ZETIA) 10 MG tablet Take 1 tablet (10 mg total) by mouth daily. 90 tablet 3   glucose blood (ONETOUCH VERIO) test strip 1 each by Other route in the morning, at noon, and at bedtime. Check blood sugar up to 3 times daily 100 each 0   isosorbide mononitrate (IMDUR) 60 MG 24 hr tablet Take 1 tablet (60 mg total) by mouth daily. 90 tablet 3   levothyroxine (SYNTHROID) 112 MCG tablet Take 1 tablet by mouth once daily (Patient taking differently: Take 112 mcg by mouth daily before breakfast.) 90 tablet 0   Melatonin ER 5 MG TBCR Take 5 mg by mouth at bedtime.     metFORMIN (GLUCOPHAGE-XR) 500 MG 24 hr tablet Take 1 tablet (500 mg total) by mouth daily  with breakfast. 90 tablet 3   metoprolol tartrate (LOPRESSOR) 25 MG tablet Take 1 tablet (25 mg total) by mouth 2 (two) times daily. 180 tablet 3   nitroGLYCERIN (NITROSTAT) 0.4 MG SL tablet Place 1 tablet (0.4 mg total) under the tongue every 5 (five) minutes as needed. 25 tablet 3   rosuvastatin (CRESTOR) 40 MG tablet Take 1 tablet by mouth once daily (Patient taking differently: Take 40 mg by mouth daily.) 90 tablet 3   Scar Treatment Products (MEDERMA) GEL Apply 1 application topically 3 (three) times a week.     vitamin B-12 (CYANOCOBALAMIN) 100 MCG tablet Take 100 mcg by mouth daily.     No current facility-administered medications for this visit.    Allergies:   Oxycontin [oxycodone]   ROS:  Please see the history of present illness.   Otherwise, review of systems are positive for none.   All other systems are reviewed and negative.    PHYSICAL EXAM: VS:  BP 110/64 (BP Location: Left Arm, Patient Position: Sitting, Cuff Size: Normal)   Pulse 62   Resp 18   Ht 5\' 7"  (1.702 m)   Wt 196 lb 12.8 oz (89.3 kg)   SpO2 96%   BMI 30.82 kg/m  , BMI Body mass index is 30.82 kg/m. GENERAL:  Well appearing NECK:  No jugular venous distention, waveform within normal limits, carotid upstroke brisk and symmetric, no bruits, no thyromegaly LUNGS:  Clear to auscultation bilaterally CHEST:  Well healed sternotomy scar. HEART:  PMI not displaced or sustained,S1 and S2 within normal limits, no S3, no S4, no clicks, no rubs, no murmurs ABD:  Flat, positive bowel sounds normal in frequency in pitch, no bruits, no rebound, no guarding, no midline pulsatile mass, no hepatomegaly, no splenomegaly EXT:  2 plus pulses throughout, no edema, no cyanosis no clubbing    EKG:  EKG is  ordered today. The ekg ordered today demonstrates sinus rhythm, rate 62, axis within normal limits, intervals within normal limits, no acute ST-T wave changes.  Cardiac Cath Dominance: Right       Recent  Labs: 04/27/2021: BUN 18; Creatinine, Ser 1.11; Hemoglobin 13.3; Platelets 255; Potassium 4.2; Sodium 140 05/02/2021: ALT 12; TSH 0.78    Lipid Panel    Component Value Date/Time   CHOL 145 04/27/2021 0842   TRIG 101 04/27/2021 0842   HDL 42 04/27/2021 0842   CHOLHDL 3.5 04/27/2021 0842   CHOLHDL 3 04/26/2020 0957   VLDL 20.0 04/26/2020 0957   LDLCALC 84 04/27/2021 0842      Wt Readings from Last 3 Encounters:  05/18/21 196 lb 12.8 oz (89.3 kg)  05/03/21 193  lb (87.5 kg)  05/02/21 194 lb (88 kg)      Other studies Reviewed: Additional studies/ records that were reviewed today include: Labs Review of the above records demonstrates: See elsewhere   ASSESSMENT AND PLAN:  CAD : He is still having some chest discomfort.  This could be non-anginal or it could be related to small vessel disease.  I am going to start Imdur 60 mg daily.  He is invited to send a message and if he has continued to have chest discomfort we can titrate this upwards.  He is also going to follow-up with his primary care physician for possible nonanginal etiologies.  DM: A1c was 5.8.  No change in therapy.  Hyperlipidemia I did switch him to Crestor and will schedule a lipid profile in about 2 months.  HTN: The blood pressure is at target.  No change in therapy.  BRUIT: He will have carotid Dopplers.  Current medicines are reviewed at length with the patient today.  The patient does not have concerns regarding medicines.  The following changes have been made: As above  Labs/ tests ordered today include:   Orders Placed This Encounter  Procedures   Lipid panel   EKG 12-Lead   VAS US CAROTID      Disposition:   FU with APP in 3 months   Signed, Minus Breeding, MD  05/18/2021 10:23 AM    [facility]

## 2021-05-18 ENCOUNTER — Ambulatory Visit: Payer: Medicare HMO | Admitting: Cardiology

## 2021-05-18 ENCOUNTER — Encounter: Payer: Self-pay | Admitting: Cardiology

## 2021-05-18 ENCOUNTER — Other Ambulatory Visit: Payer: Self-pay

## 2021-05-18 VITALS — BP 110/64 | HR 62 | Resp 18 | Ht 67.0 in | Wt 196.8 lb

## 2021-05-18 DIAGNOSIS — R0989 Other specified symptoms and signs involving the circulatory and respiratory systems: Secondary | ICD-10-CM | POA: Diagnosis not present

## 2021-05-18 DIAGNOSIS — I10 Essential (primary) hypertension: Secondary | ICD-10-CM

## 2021-05-18 DIAGNOSIS — E118 Type 2 diabetes mellitus with unspecified complications: Secondary | ICD-10-CM

## 2021-05-18 DIAGNOSIS — I2510 Atherosclerotic heart disease of native coronary artery without angina pectoris: Secondary | ICD-10-CM

## 2021-05-18 DIAGNOSIS — E785 Hyperlipidemia, unspecified: Secondary | ICD-10-CM

## 2021-05-18 MED ORDER — ISOSORBIDE MONONITRATE ER 60 MG PO TB24: 60.0000 mg | ORAL_TABLET | Freq: Every day | ORAL | 3 refills | Status: DC

## 2021-05-18 NOTE — Patient Instructions (Signed)
Medication Instructions:  BEGIN Imdur (isosorbide mononitrate) 60mg  (1 tablet) daily.  *If you need a refill on your cardiac medications before your next appointment, please call your pharmacy*   Lab Work: Lipid in 2 months   If you have labs (blood work) drawn today and your tests are completely normal, you will receive your results only by: [facility] (if you have MyChart) OR A paper copy in the mail If you have any lab test that is abnormal or we need to change your treatment, we will call you to review the results.   Testing/Procedures: Your physician has requested that you have a carotid duplex. This test is an ultrasound of the carotid arteries in your neck. It looks at blood flow through these arteries that supply the brain with blood. Allow one hour for this exam. There are no restrictions or special instructions.    Follow-Up: At [facility], you and your health needs are our priority.  As part of our continuing mission to provide you with exceptional heart care, we have created designated Provider Care Teams.  These Care Teams include your primary Cardiologist (physician) and Advanced Practice Providers (APPs -  Physician Assistants and Nurse Practitioners) who all work together to provide you with the care you need, when you need it.  We recommend signing up for the patient portal called "MyChart".  Sign up information is provided on this After Visit Summary.  MyChart is used to connect with patients for Virtual Visits (Telemedicine).  Patients are able to view lab/test results, encounter notes, upcoming appointments, etc.  Non-urgent messages can be sent to your provider as well.   To learn more about what you can do with MyChart, go to [email or web address].    Your next appointment:   3 month(s)  The format for your next appointment:   In Person  Provider:   You will see one of the following Advanced Practice Providers on your designated Care Team:   Rosaria Ferries, PA-C Jory Sims, DNP, ANP

## 2021-05-31 ENCOUNTER — Telehealth: Payer: Self-pay

## 2021-05-31 NOTE — Chronic Care Management (AMB) (Signed)
    Chronic Care Management Pharmacy Assistant   Name: Art Levan  MRN: [id number] DOB: 06/10/1946  Reason for Encounter: Chart Review    Medications: Outpatient Encounter Medications as of 05/31/2021  Medication Sig Note   aspirin EC 81 MG tablet Take 81 mg by mouth daily.    ezetimibe (ZETIA) 10 MG tablet Take 1 tablet (10 mg total) by mouth daily. 05/03/2021: Not started yet   glucose blood (ONETOUCH VERIO) test strip 1 each by Other route in the morning, at noon, and at bedtime. Check blood sugar up to 3 times daily    isosorbide mononitrate (IMDUR) 60 MG 24 hr tablet Take 1 tablet (60 mg total) by mouth daily.    levothyroxine (SYNTHROID) 112 MCG tablet Take 1 tablet by mouth once daily (Patient taking differently: Take 112 mcg by mouth daily before breakfast.)    Melatonin ER 5 MG TBCR Take 5 mg by mouth at bedtime.    metFORMIN (GLUCOPHAGE-XR) 500 MG 24 hr tablet Take 1 tablet (500 mg total) by mouth daily with breakfast.    metoprolol tartrate (LOPRESSOR) 25 MG tablet Take 1 tablet (25 mg total) by mouth 2 (two) times daily.    nitroGLYCERIN (NITROSTAT) 0.4 MG SL tablet Place 1 tablet (0.4 mg total) under the tongue every 5 (five) minutes as needed.    rosuvastatin (CRESTOR) 40 MG tablet Take 1 tablet by mouth once daily (Patient taking differently: Take 40 mg by mouth daily.)    Scar Treatment Products (MEDERMA) GEL Apply 1 application topically 3 (three) times a week.    vitamin B-12 (CYANOCOBALAMIN) 100 MCG tablet Take 100 mcg by mouth daily.    No facility-administered encounter medications on file as of 05/31/2021.   Reviewed chart for medication changes and adherence.   Recent OV, Consult or Hospital visit:  05/18/21- Minus Breeding, MD (Cardiology)- seen for CAD follow up   Recent medication changes indicated:  05/18/21- started isosorbide mononitrate 60 mg daily   No gaps in adherence identified. Patient has follow up scheduled with pharmacy team. No further action  required.  Wilford Sports CPA, CMA

## 2021-06-05 ENCOUNTER — Other Ambulatory Visit: Payer: Self-pay | Admitting: Family Medicine

## 2021-06-05 DIAGNOSIS — I2510 Atherosclerotic heart disease of native coronary artery without angina pectoris: Secondary | ICD-10-CM

## 2021-06-11 ENCOUNTER — Other Ambulatory Visit: Payer: Self-pay

## 2021-06-11 ENCOUNTER — Ambulatory Visit (HOSPITAL_COMMUNITY)
Admission: RE | Admit: 2021-06-11 | Discharge: 2021-06-11 | Disposition: A | Discharge status: Home / Self Care | Payer: Medicare HMO | Source: Ambulatory Visit | Attending: Cardiology | Admitting: Cardiology

## 2021-06-11 ENCOUNTER — Other Ambulatory Visit (HOSPITAL_COMMUNITY): Payer: Self-pay | Admitting: Cardiology

## 2021-06-11 DIAGNOSIS — I6523 Occlusion and stenosis of bilateral carotid arteries: Secondary | ICD-10-CM

## 2021-06-11 DIAGNOSIS — R0989 Other specified symptoms and signs involving the circulatory and respiratory systems: Secondary | ICD-10-CM

## 2021-06-13 ENCOUNTER — Other Ambulatory Visit: Payer: Self-pay

## 2021-06-13 ENCOUNTER — Encounter: Payer: Self-pay | Admitting: Family Medicine

## 2021-06-13 ENCOUNTER — Ambulatory Visit (INDEPENDENT_AMBULATORY_CARE_PROVIDER_SITE_OTHER): Payer: Medicare HMO | Admitting: Family Medicine

## 2021-06-13 VITALS — BP 122/76 | HR 64 | Temp 97.9°F | Wt 194.8 lb

## 2021-06-13 DIAGNOSIS — R0989 Other specified symptoms and signs involving the circulatory and respiratory systems: Secondary | ICD-10-CM

## 2021-06-13 DIAGNOSIS — E1169 Type 2 diabetes mellitus with other specified complication: Secondary | ICD-10-CM

## 2021-06-13 DIAGNOSIS — I25118 Atherosclerotic heart disease of native coronary artery with other forms of angina pectoris: Secondary | ICD-10-CM | POA: Diagnosis not present

## 2021-06-13 DIAGNOSIS — I10 Essential (primary) hypertension: Secondary | ICD-10-CM | POA: Diagnosis not present

## 2021-06-13 DIAGNOSIS — E118 Type 2 diabetes mellitus with unspecified complications: Secondary | ICD-10-CM

## 2021-06-13 DIAGNOSIS — M170 Bilateral primary osteoarthritis of knee: Secondary | ICD-10-CM

## 2021-06-13 DIAGNOSIS — E785 Hyperlipidemia, unspecified: Secondary | ICD-10-CM

## 2021-06-13 LAB — POCT GLYCOSYLATED HEMOGLOBIN (HGB A1C): Hemoglobin A1C: 6.3 % — AB (ref 4.0–5.6)

## 2021-06-13 NOTE — Progress Notes (Signed)
Subjective  CC:  Chief Complaint  Patient presents with   Diabetes   Knee Pain    Left knee, two weeks ago, unable to bear weight. Felt more like bursitis than arthritis     HPI: Adrian Gamble is a 75 y.o. male who presents to the office today for follow up of diabetes and problems listed above in the chief complaint.  Diabetes follow up: His diabetic control is reported as Unchanged.  Did not tolerate [name] due to recurrent balanitis.  Tolerating low-dose metformin 500 daily.  Watching his diet.  Denies symptoms of hyperglycemia.  He denies exertional CP or SOB or symptomatic hypoglycemia. He denies foot sores or paresthesias.  CAD with small vessel disease and angina: Reviewed cardiology notes.  He had cardiac catheterization.  No significant blockages present.  Started on Imdur.  Patient reports no chest pain.  He has been less active due to knee pain, however he did cut his grass and had less chest pain associated with that.  Less exertional symptoms.  Denies reflux symptoms.  He is on omeprazole. Carotid bruit: Reviewed recent carotid Dopplers.  Nonsignificant obstruction bilaterally. Hyperlipidemia: Now on Crestor and Zetia.  Will be due for recheck in 6 to 12 weeks.  Tolerating well. About 3 weeks ago, awoke with left-sided acute knee pain.  Pain was medial.  Minimal swelling.  No warmth.  No injury.  No locking or give way.  Improved with Tylenol.  Continues to be mildly bothersome.  Worse after sitting.  Has known osteoarthritis.  Reviewed recent x-rays.  Tricompartmental disease with most severe changes medially.  Reports he is much improved now.  He has mild symptoms intermittently.  Wt Readings from Last 3 Encounters:  06/13/21 194 lb 12.8 oz (88.4 kg)  05/18/21 196 lb 12.8 oz (89.3 kg)  05/03/21 193 lb (87.5 kg)    BP Readings from Last 3 Encounters:  06/13/21 122/76  05/18/21 110/64  05/03/21 109/64    Assessment  1. Type 2 diabetes mellitus with complication,  without long-term current use of insulin ([place])   2. Coronary artery disease of native artery of native heart with stable angina pectoris ([place])   3. Hyperlipidemia associated with type 2 diabetes mellitus ([place])   4. Essential hypertension   5. Bruit   6. Bilateral primary osteoarthritis of knee      Plan  Diabetes is currently well controlled.  Continue metformin 500 daily.  Continue diabetic diet.  Recheck 3 months.  Will need urine microalbuminuria screening at that time.  He is not on an ACE inhibitor.  He is on a beta-blocker for hypertension. CAD: Improved with Imdur.  Possibly related to small vessel coronary disease.  Continue. Hyperlipidemia on Crestor and Zetia.  Recheck fasting lipids in 3 months. Hypertension: Treated with beta-blocker due to CAD.  Not on an ACE inhibitor.  Well-controlled currently. Osteoarthritis, flare, left knee.  Exam with mild swelling and minimal tenderness.  Full range of motion.  Recommend supportive care with Voltaren gel, ice, Tylenol or Advil if needed.  Follow-up in office if not resolving.  Follow up: Return in about 3 months (around 09/13/2021) for follow up Diabetes.. Orders Placed This Encounter  Procedures   POCT HgB A1C   No orders of the defined types were placed in this encounter.     Immunization History  Administered Date(s) Administered   Fluad Quad(high Dose 65+) 09/16/2019, 09/05/2020   Influenza Split 11/01/2011, 09/22/2012   Influenza Whole 09/22/2009, 09/20/2010  Influenza, High Dose Seasonal PF 10/03/2015, 11/11/2016, 10/13/2017, 11/20/2018   Influenza,inj,Quad PF,6+ Mos 09/16/2013, 09/29/2014   PFIZER(Purple Top)SARS-COV-2 Vaccination 02/15/2020, 03/07/2020, 10/20/2020   Pneumococcal Conjugate-13 02/25/2014   Pneumococcal Polysaccharide-23 09/01/2007, 07/31/2011   Td 09/01/2007   Zoster Recombinat (Shingrix) 08/10/2020, 10/10/2020    Diabetes Related Lab Review: Lab Results  Component Value Date   HGBA1C 6.3 (A)  06/13/2021   HGBA1C 5.8 (A) 03/13/2021   HGBA1C 5.8 (A) 12/27/2020    Lab Results  Component Value Date   MICROALBUR 15.8 (H) 04/26/2020   Lab Results  Component Value Date   CREATININE 1.11 04/27/2021   BUN 18 04/27/2021   NA 140 04/27/2021   K 4.2 04/27/2021   CL 101 04/27/2021   CO2 23 04/27/2021   Lab Results  Component Value Date   CHOL 145 04/27/2021   CHOL 131 04/26/2020   CHOL 121 01/12/2019   Lab Results  Component Value Date   HDL 42 04/27/2021   HDL 37.90 (L) 04/26/2020   HDL 37.00 (L) 01/12/2019   Lab Results  Component Value Date   LDLCALC 84 04/27/2021   LDLCALC 73 04/26/2020   LDLCALC 68 01/12/2019   Lab Results  Component Value Date   TRIG 101 04/27/2021   TRIG 100.0 04/26/2020   TRIG 80.0 01/12/2019   Lab Results  Component Value Date   CHOLHDL 3.5 04/27/2021   CHOLHDL 3 04/26/2020   CHOLHDL 3 01/12/2019   No results found for: LDLDIRECT The ASCVD Risk score Mikey Bussing DC Jr., et al., 2013) failed to calculate for the following reasons:   The patient has a prior MI or stroke diagnosis I have reviewed the [place], Fam and Soc history. 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      Essential hypertension 10/05/2018    Priority: High   Type 2 diabetes mellitus with complication, without long-term current use of insulin ([place]) 08/12/2016    Priority: High    Low dose metformin due to GI side effects     Hypothyroidism (acquired) 05/29/2009    Priority: High   Hyperlipidemia associated with type 2 diabetes mellitus ([place]) 09/01/2007    Priority: High   CAD (coronary artery disease), native coronary artery 09/01/2007    Priority: High   Bruit 05/17/2021     Priority: Medium    Carotid dopplers 06/2021; bilateral minimal stenosis 1-39%     Bilateral primary osteoarthritis of knee 04/27/2020    Priority: Medium    xrays 04/2020, tricompartmental, worse medially     Diverticulosis 07/13/2019    Priority: Medium   GERD 09/01/2007    Priority: Medium   Peptic ulcer 09/01/2007    Priority: Medium   Former smoker 07/13/2019    Priority: Low   Testosterone deficiency 09/01/2007    Priority: Low   Unstable angina ([place]) 05/03/2021   Brachial neuritis or radiculitis, right 02/20/2009    Social History: Patient  reports that he quit smoking about 9 years ago. His smoking use included pipe and cigars. He has never used smokeless tobacco. He reports current alcohol use. He reports that he does not use drugs.  Review of Systems: Ophthalmic: negative for eye pain, loss of vision or double vision Cardiovascular: negative for chest pain Respiratory: negative for SOB or persistent cough Gastrointestinal: negative for abdominal pain Genitourinary: negative for dysuria or gross hematuria MSK: negative for foot lesions Neurologic: negative for weakness or gait disturbance  Objective  Vitals: BP 122/76   Pulse 64   Temp 97.9 F (36.6 C) (Temporal)   Wt 194 lb 12.8 oz (88.4 kg)   SpO2 96%   BMI 30.51 kg/m  General: well appearing, no acute distress  Psych:  Alert and oriented, normal mood and affect HEENT:  Normocephalic, atraumatic, moist mucous membranes, supple neck  Cardiovascular:  Nl S1 and S2, RRR without murmur, gallop or rub. no edema Respiratory:  Good breath sounds bilaterally, CTAB with normal effort, no rales Left knee: Full range of motion without crepitus, mild medial swelling present with mild medial joint line tenderness, no tenderness over prepatellar or pes anserine bursa.  Normal gait.   Diabetic education: ongoing education regarding chronic disease management for diabetes was given today. We continue to reinforce the  ABC's of diabetic management: A1c (<7 or 8 dependent upon patient), tight blood pressure control, and cholesterol management with goal LDL < 100 minimally. We discuss diet strategies, exercise recommendations, medication options and possible side effects. At each visit, we review recommended immunizations and preventive care recommendations for diabetics and stress that good diabetic control can prevent other problems. See below for this patient's data.   Commons side effects, risks, benefits, and alternatives for medications and treatment plan prescribed today were discussed, and the patient expressed understanding of the given instructions. Patient is instructed to call or message via MyChart if he/she has any questions or concerns regarding our treatment plan. No barriers to understanding were identified. We discussed Red Flag symptoms and signs in detail. Patient expressed understanding regarding what to do in case of urgent or emergency type symptoms.  Medication list was reconciled, printed and provided to the patient in AVS. Patient instructions and summary information was reviewed with the patient as documented in the AVS. This note was prepared with assistance of Dragon voice recognition software. Occasional wrong-word or sound-a-like substitutions may have occurred due to the inherent limitations of voice recognition software  This visit occurred during the SARS-CoV-2 public health emergency.  Safety protocols were in place, including screening questions prior to the visit, additional usage of staff PPE, and extensive cleaning of exam room while observing appropriate contact time as indicated for disinfecting solutions.

## 2021-06-13 NOTE — Patient Instructions (Signed)
Please return in 3 months for diabetes follow up   If you have any questions or concerns, please don't hesitate to send me a message via MyChart or call the office at [phone]. Thank you for visiting with us today! It's our pleasure caring for you.  

## 2021-06-20 ENCOUNTER — Encounter: Payer: Self-pay | Admitting: *Deleted

## 2021-06-27 ENCOUNTER — Other Ambulatory Visit: Payer: Self-pay

## 2021-06-27 DIAGNOSIS — Z006 Encounter for examination for normal comparison and control in clinical research program: Secondary | ICD-10-CM

## 2021-06-27 NOTE — Research (Signed)
Subject  [id number] [employer] Lp(a) [id number] Site # [phone]  SEX [x]   Male                      []   Male  Ethnicity []  Hispanic or Latino   [x]  Not Hispanic or Latino  Race [x]  White                 []  Black or [place] American  []  [place] []  American [place] or [place] Native            []  [place] or Other [place]                      []  Other  Other   Age 75  Subject Group [x]   Local Lab           []  Historical Lp(a) value                                       Results:  Future research [x]   Yes                    []  No    Amgen [id number] Site # [id number] Subject ID # [id number]         ELIGIBILITY CRITERIA WORKSHEET INCLUSION CRITERIA   Subject has provided informed consent prior to the initiation of any study specific activities/procedures [x]  Age 11 to 4 years [x]  MI (presumed type 1) OR []  PCI (with high-risk features) with at least 1 of the following: [x]  Age >49 [x]  Diabetes mellitus  HbA1c: []  History of ischemic stroke []  History of peripheral arterial disease []  Residual stenosis ? 50% []  Multivessel PCI (ie, ? 2 vessels, including branch arteries []  EXCLUSIONS THE FOLLOWING N/A [x]  Subjects known to be currently receiving investigational drug in a clinical study that is anticipated to last > 1 year []  Known Lp(a) value <45m/dL or < 200nmol/L []  Subject has a diagnosis of end-stage renal disease or requires dialysis. []  Poorly controlled (glycated hemoglobin [HbA1c] > 10%) diabetes mellitus (type 1 or type 2) []  Subject is receiving or has received lipoprotein apheresis to reduce Lp(a) within 3 months prior to enrollment. []  Known uncontrolled or recurrent ventricular tachycardia in the past 3 months prior to enrollment. []  Known malignancy (except non-melanoma skin cancers, cervical in situ carcinoma, breast ductal carcinoma in situ, or stage 1 prostate carcinoma) within the last 5 years prior to enrollment. []  Known  history or evidence of clinically significant disease (eg, respiratory, gastrointestinal, or psychiatric disease) or unstable disorder or biomarker that, in the opinion of the investigator(s), would result in life expectancy < 5 years. []  Known hemorrhagic stroke. []   [employer] Lp(a) Informed Consent   Subject Name: Adrian Gamble Subject met inclusion and exclusion criteria.  The informed consent form, study requirements and expectations were reviewed with the subject and questions and concerns were addressed prior to the signing of the consent form.  The subject verbalized understanding of the trial requirements.  The subject agreed to participate in the [facility] trial and signed the informed consent at 11:02 on 06/27/2021.  The informed consent was obtained prior to performance  of any protocol-specific procedures for the subject.  A copy of the signed informed consent was given to the subject and a copy was placed in the subject's medical record.   Sharon M Turney  [employer] Consent Version 2 Protocol Version 2  

## 2021-06-28 ENCOUNTER — Telehealth: Payer: Self-pay

## 2021-06-28 LAB — LIPOPROTEIN A (LPA): Lipoprotein (a): 133.8 nmol/L — ABNORMAL HIGH (ref <75.0)

## 2021-07-20 ENCOUNTER — Other Ambulatory Visit: Payer: Self-pay | Admitting: Family Medicine

## 2021-07-24 DIAGNOSIS — E785 Hyperlipidemia, unspecified: Secondary | ICD-10-CM | POA: Diagnosis not present

## 2021-07-24 LAB — LIPID PANEL
Chol/HDL Ratio: 2.4 ratio (ref 0.0–5.0)
Cholesterol, Total: 122 mg/dL (ref 100–199)
HDL: 51 mg/dL (ref >39)
LDL Chol Calc (NIH): 53 mg/dL (ref 0–99)
Triglycerides: 94 mg/dL (ref 0–149)
VLDL Cholesterol Cal: 18 mg/dL (ref 5–40)

## 2021-07-27 ENCOUNTER — Encounter: Payer: Self-pay | Admitting: Family Medicine

## 2021-07-30 ENCOUNTER — Other Ambulatory Visit: Payer: Self-pay

## 2021-07-30 ENCOUNTER — Other Ambulatory Visit: Payer: Self-pay | Admitting: Family Medicine

## 2021-07-30 MED ORDER — FREESTYLE SYSTEM KIT: 1.0000 | PACK | 0 refills | Status: DC | PRN

## 2021-07-30 MED ORDER — GLUCOSE BLOOD VI STRP: 1.0000 | ORAL_STRIP | 11 refills | Status: DC | PRN

## 2021-08-09 ENCOUNTER — Telehealth: Payer: Self-pay

## 2021-08-09 ENCOUNTER — Other Ambulatory Visit: Payer: Self-pay

## 2021-08-09 DIAGNOSIS — E118 Type 2 diabetes mellitus with unspecified complications: Secondary | ICD-10-CM

## 2021-08-09 MED ORDER — GLUCOSE BLOOD VI STRP: ORAL_STRIP | 12 refills | Status: AC

## 2021-08-09 MED ORDER — ONETOUCH VERIO VI STRP: 1.0000 | ORAL_STRIP | Freq: Three times a day (TID) | 0 refills | Status: DC

## 2021-08-09 NOTE — Telephone Encounter (Signed)
Sent in onetouch to pharmacy as patient requested per insurance.

## 2021-08-09 NOTE — Telephone Encounter (Signed)
Patient called in stating insurance doesn't cover Freestyle meter but will cover One Touch meter, patient is asking if we can prescribe the One Touch.

## 2021-08-10 ENCOUNTER — Telehealth: Payer: Self-pay

## 2021-08-10 ENCOUNTER — Other Ambulatory Visit: Payer: Self-pay

## 2021-08-10 MED ORDER — ONETOUCH VERIO W/DEVICE KIT: PACK | 0 refills | Status: DC

## 2021-08-10 MED ORDER — ONETOUCH DELICA LANCETS 33G MISC: 1 refills | Status: DC

## 2021-08-10 NOTE — Telephone Encounter (Signed)
Meter and lancets have been sent to pharmacy.

## 2021-08-10 NOTE — Telephone Encounter (Signed)
[facility] called stating that they need the prescription for the meter and strips called in. She stated that he needs to be re entered. Please Advise.

## 2021-08-13 ENCOUNTER — Telehealth: Payer: Self-pay | Admitting: Pharmacist

## 2021-08-13 NOTE — Chronic Care Management (AMB) (Addendum)
Chronic Care Management Pharmacy Assistant   Name: Birch Farino  MRN: [id number] DOB: 02/21/1946   Reason for Encounter: Disease State call for DM    Recent office visits:  06/13/21 Billey Chang MD. No med changes. Follow up in 3 months.  Recent consult visits:  06/27/21 Wendelyn Breslow RN No med changes. No follow up on file   Hospital visits:  None since 05/31/21  Medications: Outpatient Encounter Medications as of 08/13/2021  Medication Sig Note   aspirin EC 81 MG tablet Take 81 mg by mouth daily.    Blood Glucose Monitoring Suppl (ONETOUCH VERIO) w/Device KIT Use kit as directed.    ezetimibe (ZETIA) 10 MG tablet Take 1 tablet (10 mg total) by mouth daily. 05/03/2021: Not started yet   glucose blood (ONETOUCH VERIO) test strip 1 each by Other route in the morning, at noon, and at bedtime. Check blood sugar up to 3 times daily    glucose blood test strip Use as instructed    isosorbide mononitrate (IMDUR) 60 MG 24 hr tablet Take 1 tablet (60 mg total) by mouth daily.    levothyroxine (SYNTHROID) 112 MCG tablet Take 1 tablet by mouth once daily    Melatonin ER 5 MG TBCR Take 5 mg by mouth at bedtime.    metFORMIN (GLUCOPHAGE-XR) 500 MG 24 hr tablet Take 1 tablet (500 mg total) by mouth daily with breakfast.    metoprolol tartrate (LOPRESSOR) 25 MG tablet Take 1/2 (one-half) tablet by mouth twice daily    nitroGLYCERIN (NITROSTAT) 0.4 MG SL tablet Place 1 tablet (0.4 mg total) under the tongue every 5 (five) minutes as needed.    OneTouch Delica Lancets 73U MISC Check blood sugar up to 3 times daily.    rosuvastatin (CRESTOR) 40 MG tablet Take 1 tablet by mouth once daily (Patient taking differently: Take 40 mg by mouth daily.)    Scar Treatment Products (MEDERMA) GEL Apply 1 application topically 3 (three) times a week.    vitamin B-12 (CYANOCOBALAMIN) 100 MCG tablet Take 100 mcg by mouth daily.    No facility-administered encounter medications on file as of 08/13/2021.   Recent  Relevant Labs: Lab Results  Component Value Date/Time   HGBA1C 6.3 (A) 06/13/2021 11:27 AM   HGBA1C 5.8 (A) 03/13/2021 11:07 AM   HGBA1C 6.7 (H) 08/13/2019 02:01 PM   HGBA1C 6.1 07/07/2017 08:58 AM   MICROALBUR 15.8 (H) 04/26/2020 12:20 PM   MICROALBUR 21.4 (H) 09/16/2019 11:33 AM    Kidney Function Lab Results  Component Value Date/Time   CREATININE 1.11 04/27/2021 08:44 AM   CREATININE 1.05 04/26/2020 09:57 AM   GFR 68.74 04/26/2020 09:57 AM   GFRNONAA 87 (L) 06/12/2012 06:00 AM   GFRAA >90 06/12/2012 06:00 AM    Current antihyperglycemic regimen:  Metformin 500 mg  daily  What recent interventions/DTPs have been made to improve glycemic control:  Pt states there has not been any changes  Have there been any recent hospitalizations or ED visits since last visit with CPP? No Patient denies hypoglycemic symptoms, including None Patient denies hyperglycemic symptoms, including none How often are you checking your blood sugar? Pt states he checks his sugars throughout the day butt does not check at specific times. What are your blood sugars ranging? Current reading done on 08/12/21 after big meal (142)  During the week, how often does your blood glucose drop below 70? Never Are you checking your feet daily/regularly? Pt is checking feet on a regular basis  and states sometimes the top of his feet and shin will itch and wasn't sure if that was due to sugars or not.  Adherence Review: Is the patient currently on a STATIN medication? Yes Is the patient currently on ACE/ARB medication? Yes Does the patient have >5 day gap between last estimated fill dates? No  Care Gaps: Medicare Annual Wellness: done on 09/04/20 Ophthalmology Exam: Overdue since 12/27/20 Foot Exam: Next due on 05/02/22 Hemoglobin A1C: 06/13/21 (6.3) Colonoscopy: last one done in 2015  Star Rating Drugs: Metformin 500 mg 1 tablet daily last filled on 06/05/21 90ds Rosuvastatin 40 mg 1 tablet daily last filled on  06/05/21 90ds   Future Appointments  Date Time Provider [place]  08/24/2021  9:45 AM Lendon Colonel, NP [facility]  09/07/2021 11:45 AM [facility] HEALTH COACH [facility]  09/13/2021 10:00 AM Leamon Arnt, MD [facility]  12/25/2021  9:00 AM [facility] PHARMACIST [facility]     Danielle Gerringer CMA  Health Concierge    10 minutes spent in review, coordination, and documentation. AWV scheduled in October.  Reviewed by: Beverly Milch, PharmD Clinical Pharmacist [phone]

## 2021-08-22 NOTE — Progress Notes (Deleted)
Cardiology Office Note   Date:  08/22/2021   ID:  Adrian Gamble, DOB 15-Jul-1946, MRN [id number]  PCP:  Leamon Arnt, MD  Cardiologist: Dr. Percival Spanish No chief complaint on file.    History of Present Illness: Adrian Gamble is a 75 y.o. male who presents for ongoing assessment and management of coronary artery disease, status post remote PCI to his RCA in 2001, cardiac catheterization 06/01/2012 demonstrated three-vessel CAD with preserved LV function and CABG was recommended.  He underwent CABG with Dr. Servando Snare with grafts including LIMA to LAD, SVG to OM1, and distal circumflex, SVG to distal RCA.  Other history includes hypothyroidism, hyperlipidemia, type 2 diabetes, and GERD.  He was last seen by Dr. Percival Spanish on 05/18/2021 and was still having complaints of chest discomfort when he does something like pushing a lawn more for several minutes.  He believed it was different than his GERD symptoms.  He did not have any associated dyspnea, PND or orthopnea.  Dr. Percival Spanish started him on isosorbide 60 mg daily with potential to titrate higher if he has continued discomfort.  He was switched to Crestor and was to have a follow-up lipid and LFT blood draw in August 2022.  He was noted to have a carotid bruit and therefore carotid Dopplers were ordered.  Follow-up labs on 07/24/2021 revealed total cholesterol 122, HDL 51, LDL 53, with triglycerides 94.  Carotid Doppler studies were consistent with 1 to 39% stenosis of the right ICA, left ICA 1 to 39% stenosis.  No significant plaque was noted.  Past Medical History:  Diagnosis Date   Bilateral primary osteoarthritis of knee 04/27/2020   xrays 04/2020, tricompartmental, worse medially   CAD (coronary artery disease), s/p MI - s/p PCI to RCA 2001;  [place] 7/13: 3v CAD, good LVF - >  s/p CABG 7/13 (L-LAD, S-OM1/dCFX, S-dRCA)    Diabetes mellitus type 2, controlled ([place])    Diverticulosis    Early cataracts, bilateral    GERD 09/01/2007   H/O hiatal hernia     HLD (hyperlipidemia), on statin    Hypothyroidism. 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   REM disorder   Testosterone deficiency     Past Surgical History:  Procedure Laterality Date   arm surgery  1957   d/t broken   CARDIAC CATHETERIZATION  2005; 06/01/2012   COLONOSCOPY  2005   CORONARY ANGIOPLASTY WITH STENT PLACEMENT  2001   RCA-1 stent   CORONARY ARTERY BYPASS GRAFT  06/09/2012   Procedure: CORONARY ARTERY BYPASS GRAFTING (CABG);  Surgeon: Grace Isaac, MD;  Location: [facility];  Service: Open Heart Surgery;  Laterality: N/A;  CABG x four;  using left internal mammary artery and right leg greater saphenous vein harvested endoscopically   ESOPHAGOGASTRODUODENOSCOPY     LEFT HEART CATH AND CORONARY ANGIOGRAPHY N/A 05/03/2021   Procedure: LEFT HEART CATH AND CORONARY ANGIOGRAPHY;  Surgeon: Martinique, Peter M, MD;  Location: [facility];  Service: Cardiovascular;  Laterality: N/A;   PERCUTANEOUS CORONARY STENT INTERVENTION (PCI-S) N/A 06/01/2012   Procedure: PERCUTANEOUS CORONARY STENT INTERVENTION (PCI-S);  Surgeon: Burnell Blanks, MD;  [facility] CATH [facility];  Service: Cardiovascular;  Laterality: N/A;   TONSILLECTOMY AND ADENOIDECTOMY  1955   at age 55     Current Outpatient Medications  Medication Sig Dispense Refill  aspirin EC 81 MG tablet Take 81 mg by mouth daily.     Blood Glucose Monitoring Suppl (ONETOUCH VERIO) w/Device KIT Use kit as directed. 1 kit 0   ezetimibe (ZETIA) 10 MG tablet Take 1 tablet (10 mg total) by mouth daily. 90 tablet 3   glucose blood (ONETOUCH VERIO) test strip 1 each by Other route in the morning, at noon, and at bedtime. Check blood sugar up to 3 times daily 100 each 0   glucose blood test strip Use as instructed 100 each 12   isosorbide mononitrate (IMDUR) 60 MG 24 hr tablet Take 1 tablet (60 mg total) by mouth daily.  90 tablet 3   levothyroxine (SYNTHROID) 112 MCG tablet Take 1 tablet by mouth once daily 90 tablet 0   Melatonin ER 5 MG TBCR Take 5 mg by mouth at bedtime.     metFORMIN (GLUCOPHAGE-XR) 500 MG 24 hr tablet Take 1 tablet (500 mg total) by mouth daily with breakfast. 90 tablet 3   metoprolol tartrate (LOPRESSOR) 25 MG tablet Take 1/2 (one-half) tablet by mouth twice daily 90 tablet 0   nitroGLYCERIN (NITROSTAT) 0.4 MG SL tablet Place 1 tablet (0.4 mg total) under the tongue every 5 (five) minutes as needed. 25 tablet 3   OneTouch Delica Lancets 68T MISC Check blood sugar up to 3 times daily. 90 each 1   rosuvastatin (CRESTOR) 40 MG tablet Take 1 tablet by mouth once daily (Patient taking differently: Take 40 mg by mouth daily.) 90 tablet 3   Scar Treatment Products (MEDERMA) GEL Apply 1 application topically 3 (three) times a week.     vitamin B-12 (CYANOCOBALAMIN) 100 MCG tablet Take 100 mcg by mouth daily.     No current facility-administered medications for this visit.    Allergies:   Oxycontin [oxycodone]    Social History:  The patient  reports that he quit smoking about 9 years ago. His smoking use included pipe and cigars. He has never used smokeless tobacco. He reports current alcohol use. He reports that he does not use drugs.   Family History:  The patient's family history includes Coronary artery disease (age of onset: 11) in his father; Memory loss in his father.    ROS: All other systems are reviewed and negative. Unless otherwise mentioned in H&P    PHYSICAL EXAM: VS:  There were no vitals taken for this visit. , BMI There is no height or weight on file to calculate BMI. GEN: Well nourished, well developed, in no acute distress HEENT: normal Neck: no JVD, carotid bruits, or masses Cardiac: ***RRR; no murmurs, rubs, or gallops,no edema  Respiratory:  Clear to auscultation bilaterally, normal work of breathing GI: soft, nontender, nondistended, + BS MS: no deformity or  atrophy Skin: warm and dry, no rash Neuro:  Strength and sensation are intact Psych: euthymic mood, full affect   EKG:  EKG {ACTION; IS/IS LXB:[id number]} ordered today. The ekg ordered today demonstrates ***   Recent Labs: 04/27/2021: BUN 18; Creatinine, Ser 1.11; Hemoglobin 13.3; Platelets 255; Potassium 4.2; Sodium 140 05/02/2021: ALT 12; TSH 0.78    Lipid Panel    Component Value Date/Time   CHOL 122 07/24/2021 1006   TRIG 94 07/24/2021 1006   HDL 51 07/24/2021 1006   CHOLHDL 2.4 07/24/2021 1006   CHOLHDL 3 04/26/2020 0957   VLDL 20.0 04/26/2020 0957   LDLCALC 53 07/24/2021 1006      Wt Readings from Last 3 Encounters:  06/13/21 194 lb 12.8  oz (88.4 kg)  05/18/21 196 lb 12.8 oz (89.3 kg)  05/03/21 193 lb (87.5 kg)      Other studies Reviewed: Additional studies/ records that were reviewed today include:   Carotid Doppler: 7/111/2022 Right Carotid: Velocities in the right ICA are consistent with a 1-39%  stenosis.   Left Carotid: Velocities in the left ICA are consistent with a 1-39%  stenosis.   Vertebrals:  Bilateral vertebral arteries demonstrate antegrade flow.  Right               vertebral abherrent flow.  Subclavians: Right subclavian artery flow was disturbed. Normal flow               hemodynamics were seen in the left subclavian artery.   Left Heart Cath 05/03/2021 Ost LM to Mid LM lesion is 60% stenosed. Prox LAD lesion is 90% stenosed. Mid LAD lesion is 100% stenosed. Prox Cx lesion is 40% stenosed. 1st Mrg lesion is 100% stenosed. 2nd Mrg lesion is 70% stenosed. 3rd Mrg lesion is 100% stenosed. Mid Cx to Dist Cx lesion is 60% stenosed. Prox RCA to Mid RCA lesion is 90% stenosed. Dist RCA lesion is 90% stenosed. SVG graft was visualized by angiography and is normal in caliber. The graft exhibits no disease. SVG graft was visualized by angiography and is normal in caliber. The graft exhibits no disease. LIMA graft was visualized by angiography  and is normal in caliber. The graft exhibits no disease. The left ventricular systolic function is normal. LV end diastolic pressure is normal. The left ventricular ejection fraction is 55-65% by visual estimate.   1. Severe 3 vessel occlusive CAD 2. Patent LIMA to the LAD 3. Patent sequential SVG to OM1 and OM3 4. Patent SVG to distal RCA 5. Normal LV function 6. Normal LVEDP   Plan: Bypass grafts are all patent. The only potential area of ischemia is in the second OM. This is diffusely diseased and heavily calcified. It is chronic. I would recommend continued medical therapy.   ASSESSMENT AND PLAN:  1.  ***   Current medicines are reviewed at length with the patient today.  I have spent *** dedicated to the care of this patient on the date of this encounter to include pre-visit review of records, assessment, management and diagnostic testing,with shared decision making.  Labs/ tests ordered today include: *** Phill Myron. West Pugh, ANP, AACC   08/22/2021 4:47 PM    [facility] [place] [address] Office [phone] Fax [phone]  Notice: This dictation was prepared with Dragon dictation along with smaller phrase technology. Any transcriptional errors that result from this process are unintentional and may not be corrected upon review.

## 2021-08-23 ENCOUNTER — Encounter: Payer: Medicare HMO | Admitting: Family Medicine

## 2021-08-23 ENCOUNTER — Telehealth: Payer: Self-pay

## 2021-08-23 NOTE — Telephone Encounter (Signed)
Called patient to inform appointment with Arnold Long needs to be canceled. Advised patient scheduling team will call to reschedule.

## 2021-08-24 ENCOUNTER — Ambulatory Visit: Payer: Medicare HMO | Admitting: Adult Health

## 2021-08-30 ENCOUNTER — Other Ambulatory Visit: Payer: Self-pay | Admitting: Family Medicine

## 2021-08-30 DIAGNOSIS — E118 Type 2 diabetes mellitus with unspecified complications: Secondary | ICD-10-CM

## 2021-09-02 NOTE — Progress Notes (Signed)
Cardiology Office Note   Date:  09/04/2021   ID:  Adrian Gamble, DOB [birth year], MRN [id number]  PCP:  Leamon Arnt, MD  Cardiologist:   Minus Breeding, MD   Chief Complaint  Patient presents with   Chest Pain        History of Present Illness: Adrian Gamble is a 75 y.o. male who presents for follow up of CAD.  He is status post remote PCI to his RCA in 2001, HL, DM2, hypothyroidism, GERD. Cardiac cath in  06/01/12 and demonstrated 3 vessel CAD with preserved LV function. CABG was recommended. He was taken off of Plavix and discharged to home and brought back on 06/09/12. He underwent CABG with Dr. Servando Snare with grafts including LIMA-LAD, SVG-OM1 and distal circumflex, SVG-distal RCA. Postop course was fairly uneventful.   He had chest pain with cath earlier this year. Results are below.    He had some chest discomfort at the last appointment and I started isosorbide mononitrate at 60 mg.  He went from having to stop 6 times during his 1 going to only having to stop twice because of some chest discomfort.  He rarely gets some resting chest discomfort now which he was doing well routinely.  He does not take any nitroglycerin.  He stays active.  He denies any shortness of breath, PND or orthopnea.  He said no weight gain or edema.   Past Medical History:  Diagnosis Date   Bilateral primary osteoarthritis of knee 04/27/2020   xrays 04/2020, tricompartmental, worse medially   CAD (coronary artery disease), s/p MI - s/p PCI to RCA 2001;  [place] 7/13: 3v CAD, good LVF - >  s/p CABG 7/13 (L-LAD, S-OM1/dCFX, S-dRCA)    Diabetes mellitus type 2, controlled ([place])    Diverticulosis    Early cataracts, bilateral    GERD 09/01/2007   H/O hiatal hernia    HLD (hyperlipidemia), on statin    Hypothyroidism. 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   REM disorder   Testosterone  deficiency     Past Surgical History:  Procedure Laterality Date   arm surgery  1957   d/t broken   CARDIAC CATHETERIZATION  2005; 06/01/2012   COLONOSCOPY  2005   CORONARY ANGIOPLASTY WITH STENT PLACEMENT  2001   RCA-1 stent   CORONARY ARTERY BYPASS GRAFT  06/09/2012   Procedure: CORONARY ARTERY BYPASS GRAFTING (CABG);  Surgeon: Grace Isaac, MD;  Location: [facility];  Service: Open Heart Surgery;  Laterality: N/A;  CABG x four;  using left internal mammary artery and right leg greater saphenous vein harvested endoscopically   ESOPHAGOGASTRODUODENOSCOPY     LEFT HEART CATH AND CORONARY ANGIOGRAPHY N/A 05/03/2021   Procedure: LEFT HEART CATH AND CORONARY ANGIOGRAPHY;  Surgeon: Martinique, Peter M, MD;  Location: [facility];  Service: Cardiovascular;  Laterality: N/A;   PERCUTANEOUS CORONARY STENT INTERVENTION (PCI-S) N/A 06/01/2012   Procedure: PERCUTANEOUS CORONARY STENT INTERVENTION (PCI-S);  Surgeon: Burnell Blanks, MD;  Location: [facility] CATH LAB;  Service: Cardiovascular;  Laterality: N/A;   TONSILLECTOMY AND ADENOIDECTOMY  1955   at age 12     Current Outpatient Medications  Medication Sig Dispense Refill   aspirin EC 81 MG tablet Take 81 mg by mouth daily.     Blood Glucose Monitoring Suppl (ONETOUCH VERIO)  w/Device KIT Use kit as directed. 1 kit 0   ezetimibe (ZETIA) 10 MG tablet Take 1 tablet (10 mg total) by mouth daily. 90 tablet 3   glucose blood (ONETOUCH VERIO) test strip 1 each by Other route in the morning, at noon, and at bedtime. Check blood sugar up to 3 times daily 100 each 0   glucose blood test strip Use as instructed 100 each 12   levothyroxine (SYNTHROID) 112 MCG tablet Take 1 tablet by mouth once daily 90 tablet 0   Melatonin ER 5 MG TBCR Take 5 mg by mouth at bedtime.     metFORMIN (GLUCOPHAGE-XR) 500 MG 24 hr tablet Take 1 tablet by mouth once daily with breakfast 90 tablet 0   metoprolol tartrate (LOPRESSOR) 25 MG tablet Take 1/2 (one-half) tablet by mouth  twice daily 90 tablet 0   nitroGLYCERIN (NITROSTAT) 0.4 MG SL tablet Place 1 tablet (0.4 mg total) under the tongue every 5 (five) minutes as needed. 25 tablet 3   OneTouch Delica Lancets 26V MISC Check blood sugar up to 3 times daily. 90 each 1   rosuvastatin (CRESTOR) 40 MG tablet Take 1 tablet by mouth once daily (Patient taking differently: Take 40 mg by mouth daily.) 90 tablet 3   Scar Treatment Products (MEDERMA) GEL Apply 1 application topically 3 (three) times a week.     vitamin B-12 (CYANOCOBALAMIN) 100 MCG tablet Take 100 mcg by mouth daily.     isosorbide mononitrate (IMDUR) 120 MG 24 hr tablet Take 1 tablet (120 mg total) by mouth daily. 90 tablet 3   No current facility-administered medications for this visit.    Allergies:   Oxycontin [oxycodone]   ROS:  Please see the history of present illness.   Otherwise, review of systems are positive for none.   All other systems are reviewed and negative.    PHYSICAL EXAM: VS:  BP 108/62   Pulse 61   Ht '5\' 8"'  (1.727 m)   Wt 199 lb (90.3 kg)   SpO2 98%   BMI 30.26 kg/m  , BMI Body mass index is 30.26 kg/m. GENERAL:  Well appearing NECK:  No jugular venous distention, waveform within normal limits, carotid upstroke brisk and symmetric, no bruits, no thyromegaly LUNGS:  Clear to auscultation bilaterally CHEST:  Well healed sternotomy scar. HEART:  PMI not displaced or sustained,S1 and S2 within normal limits, no S3, no S4, no clicks, no rubs, brief apical systolic murmur heard at the left upper sternal border, no diastolic murmurs ABD:  Flat, positive bowel sounds normal in frequency in pitch, no bruits, no rebound, no guarding, no midline pulsatile mass, no hepatomegaly, no splenomegaly EXT:  2 plus pulses throughout, no edema, no cyanosis no clubbing   EKG:  EKG is not ordered today.   Cardiac Cath Dominance: Right       Recent Labs: 04/27/2021: BUN 18; Creatinine, Ser 1.11; Hemoglobin 13.3; Platelets 255; Potassium  4.2; Sodium 140 05/02/2021: ALT 12; TSH 0.78    Lipid Panel    Component Value Date/Time   CHOL 122 07/24/2021 1006   TRIG 94 07/24/2021 1006   HDL 51 07/24/2021 1006   CHOLHDL 2.4 07/24/2021 1006   CHOLHDL 3 04/26/2020 0957   VLDL 20.0 04/26/2020 0957   LDLCALC 53 07/24/2021 1006      Wt Readings from Last 3 Encounters:  09/04/21 199 lb (90.3 kg)  06/13/21 194 lb 12.8 oz (88.4 kg)  05/18/21 196 lb 12.8 oz (89.3 kg)  Other studies Reviewed: Additional studies/ records that were reviewed today include: Labs Review of the above records demonstrates: See elsewhere   ASSESSMENT AND PLAN:  CAD :   He still has a little bit of chest discomfort but is much improved.  I have convinced him to try 126 mg of Imdur.  Other meds will be as listed.   DM:  A1C WAS 6.3 up from 5.8.  No change in therapy.  Hyperlipidemia :    LDL was 53 with an HDL of 51.  No change in therapy.   HTN: The blood pressure is his blood pressure is at target.  No change in therapy.   BRUIT: He had Doppler without stenosis in July.    Current medicines are reviewed at length with the patient today.  The patient does not have concerns regarding medicines.  The following changes have been made: As above  Labs/ tests ordered today include: None  No orders of the defined types were placed in this encounter.     Disposition:   FU with 12 in  months   Signed, Minus Breeding, MD  09/04/2021 3:29 PM    [place]

## 2021-09-03 ENCOUNTER — Other Ambulatory Visit: Payer: Self-pay | Admitting: Family Medicine

## 2021-09-03 DIAGNOSIS — E118 Type 2 diabetes mellitus with unspecified complications: Secondary | ICD-10-CM

## 2021-09-04 ENCOUNTER — Ambulatory Visit: Payer: Medicare HMO | Admitting: Cardiology

## 2021-09-04 ENCOUNTER — Encounter: Payer: Self-pay | Admitting: Cardiology

## 2021-09-04 ENCOUNTER — Other Ambulatory Visit: Payer: Self-pay

## 2021-09-04 VITALS — BP 108/62 | HR 61 | Ht 68.0 in | Wt 199.0 lb

## 2021-09-04 DIAGNOSIS — E118 Type 2 diabetes mellitus with unspecified complications: Secondary | ICD-10-CM | POA: Diagnosis not present

## 2021-09-04 DIAGNOSIS — I10 Essential (primary) hypertension: Secondary | ICD-10-CM | POA: Diagnosis not present

## 2021-09-04 DIAGNOSIS — I25118 Atherosclerotic heart disease of native coronary artery with other forms of angina pectoris: Secondary | ICD-10-CM

## 2021-09-04 MED ORDER — ISOSORBIDE MONONITRATE ER 120 MG PO TB24: 120.0000 mg | ORAL_TABLET | Freq: Every day | ORAL | 3 refills | Status: DC

## 2021-09-04 NOTE — Patient Instructions (Signed)
Medication Instructions:  Increase Imdur to 120 mg daily  *If you need a refill on your cardiac medications before your next appointment, please call your pharmacy*   Lab Work: None ordered today   Testing/Procedures: None ordered today   Follow-Up: At [facility], you and your health needs are our priority.  As part of our continuing mission to provide you with exceptional heart care, we have created designated Provider Care Teams.  These Care Teams include your primary Cardiologist (physician) and Advanced Practice Providers (APPs -  Physician Assistants and Nurse Practitioners) who all work together to provide you with the care you need, when you need it.  We recommend signing up for the patient portal called "MyChart".  Sign up information is provided on this After Visit Summary.  MyChart is used to connect with patients for Virtual Visits (Telemedicine).  Patients are able to view lab/test results, encounter notes, upcoming appointments, etc.  Non-urgent messages can be sent to your provider as well.   To learn more about what you can do with MyChart, go to [email or web address].    Your next appointment:   1 year(s)  The format for your next appointment:   In Person  Provider:   Minus Breeding, MD

## 2021-09-07 ENCOUNTER — Ambulatory Visit: Payer: Medicare HMO

## 2021-09-13 ENCOUNTER — Encounter: Payer: Self-pay | Admitting: Family Medicine

## 2021-09-13 ENCOUNTER — Ambulatory Visit (INDEPENDENT_AMBULATORY_CARE_PROVIDER_SITE_OTHER): Payer: Medicare HMO | Admitting: Family Medicine

## 2021-09-13 ENCOUNTER — Other Ambulatory Visit: Payer: Self-pay

## 2021-09-13 VITALS — BP 128/88 | HR 63 | Temp 97.8°F | Ht 68.0 in | Wt 195.2 lb

## 2021-09-13 DIAGNOSIS — Z23 Encounter for immunization: Secondary | ICD-10-CM

## 2021-09-13 DIAGNOSIS — E785 Hyperlipidemia, unspecified: Secondary | ICD-10-CM | POA: Diagnosis not present

## 2021-09-13 DIAGNOSIS — E1169 Type 2 diabetes mellitus with other specified complication: Secondary | ICD-10-CM | POA: Diagnosis not present

## 2021-09-13 DIAGNOSIS — E118 Type 2 diabetes mellitus with unspecified complications: Secondary | ICD-10-CM | POA: Diagnosis not present

## 2021-09-13 DIAGNOSIS — I10 Essential (primary) hypertension: Secondary | ICD-10-CM | POA: Diagnosis not present

## 2021-09-13 DIAGNOSIS — I25118 Atherosclerotic heart disease of native coronary artery with other forms of angina pectoris: Secondary | ICD-10-CM | POA: Diagnosis not present

## 2021-09-13 LAB — COMPREHENSIVE METABOLIC PANEL
ALT: 14 U/L (ref 0–53)
AST: 18 U/L (ref 0–37)
Albumin: 4.4 g/dL (ref 3.5–5.2)
Alkaline Phosphatase: 45 U/L (ref 39–117)
BUN: 14 mg/dL (ref 6–23)
CO2: 28 mEq/L (ref 19–32)
Calcium: 9.5 mg/dL (ref 8.4–10.5)
Chloride: 104 mEq/L (ref 96–112)
Creatinine, Ser: 1.07 mg/dL (ref 0.40–1.50)
GFR: 67.17 mL/min (ref >60.00)
Glucose, Bld: 134 mg/dL — ABNORMAL HIGH (ref 70–99)
Potassium: 4.1 mEq/L (ref 3.5–5.1)
Sodium: 138 mEq/L (ref 135–145)
Total Bilirubin: 1.1 mg/dL (ref 0.2–1.2)
Total Protein: 7.1 g/dL (ref 6.0–8.3)

## 2021-09-13 LAB — POCT GLYCOSYLATED HEMOGLOBIN (HGB A1C): Hemoglobin A1C: 6.4 % — AB (ref 4.0–5.6)

## 2021-09-13 LAB — LIPID PANEL
Cholesterol: 103 mg/dL (ref 0–200)
HDL: 40 mg/dL (ref >39.00)
LDL Cholesterol: 43 mg/dL (ref 0–99)
NonHDL: 62.9
Total CHOL/HDL Ratio: 3
Triglycerides: 102 mg/dL (ref 0.0–149.0)
VLDL: 20.4 mg/dL (ref 0.0–40.0)

## 2021-09-13 LAB — MICROALBUMIN / CREATININE URINE RATIO
Creatinine,U: 86.5 mg/dL
Microalb Creat Ratio: 2.4 mg/g (ref 0.0–30.0)
Microalb, Ur: 2.1 mg/dL — ABNORMAL HIGH (ref 0.0–1.9)

## 2021-09-13 MED ORDER — METFORMIN HCL ER 500 MG PO TB24: 500.0000 mg | ORAL_TABLET | Freq: Two times a day (BID) | ORAL | 0 refills | Status: DC

## 2021-09-13 NOTE — Progress Notes (Signed)
Subjective  CC:  Chief Complaint  Patient presents with   Diabetes   Hypertension   Hyperlipidemia   Hypothyroidism    HPI: Adrian Gamble is a 75 y.o. male who presents to the office today for follow up of diabetes and problems listed above in the chief complaint.  Diabetes follow up: His diabetic control is reported as Worse. He is noting elevated fastings and postprandials. For unclear reasons, reports diet and activity level have not changed. On met xr 500 daily. No recent infections or steroids.  He has stable exertional angina on imdur, improved but remains present at times. This could be contributing to stress induced hyperglycemia. no SOB or symptomatic hypoglycemia. He denies foot sores or paresthesias. He has his eye appt schedueld feb 2022; was rescheduled and that is as soon as he could get it. Flu shot today. Due nephropathy screen HTN: controleld on low dose bb HLD: now on crestor and zetia and due for recheck. Tolerating well. He is fasting. LDL goal < 70  Wt Readings from Last 3 Encounters:  09/13/21 195 lb 3.2 oz (88.5 kg)  09/04/21 199 lb (90.3 kg)  06/13/21 194 lb 12.8 oz (88.4 kg)    BP Readings from Last 3 Encounters:  09/13/21 128/88  09/04/21 108/62  06/13/21 122/76    Assessment  1. Type 2 diabetes mellitus with complication, without long-term current use of insulin ([place])   2. Hyperlipidemia associated with type 2 diabetes mellitus ([place])   3. Essential hypertension   4. Coronary artery disease of native artery of native heart with stable angina pectoris ([place])   5. Type 2 diabetes mellitus with complication, without long-term current use of insulin ([place])   6. Need for immunization against influenza      Plan  Diabetes is currently adequately controlled. But worsening hyperglycemia over last month.... will see if he can tolerate met er 500 bid; titrate up slowly to avoid GI SE. Monitor diet. Check bid home glucose, fasting and postprandial goals reviewed.  Eye exam scheduled. Check urine. Flu shot today.  HTN controlled metoprolol 12.5 bid; bb needed for angina/cad Angina, stable: improved on imdur.  HLD: recheck lipids on crestor  and zetia.   Follow up: 3 mo for dm. Orders Placed This Encounter  Procedures   Flu Vaccine QUAD High Dose(Fluad)   Comprehensive metabolic panel   Lipid panel   Microalbumin / creatinine urine ratio   POCT HgB A1C   Meds ordered this encounter  Medications   metFORMIN (GLUCOPHAGE-XR) 500 MG 24 hr tablet    Sig: Take 1 tablet (500 mg total) by mouth in the morning and at bedtime.    Dispense:  90 tablet    Refill:  0      Immunization History  Administered Date(s) Administered   Fluad Quad(high Dose 65+) 09/16/2019, 09/05/2020, 09/13/2021   Influenza Split 11/01/2011, 09/22/2012   Influenza Whole 09/22/2009, 09/20/2010   Influenza, High Dose Seasonal PF 10/03/2015, 11/11/2016, 10/13/2017, 11/20/2018   Influenza,inj,Quad PF,6+ Mos 09/16/2013, 09/29/2014   PFIZER(Purple Top)SARS-COV-2 Vaccination 02/15/2020, 03/07/2020, 10/20/2020   Pneumococcal Conjugate-13 02/25/2014   Pneumococcal Polysaccharide-23 09/01/2007, 07/31/2011   Td 09/01/2007   Zoster Recombinat (Shingrix) 08/10/2020, 10/10/2020    Diabetes Related Lab Review: Lab Results  Component Value Date   HGBA1C 6.4 (A) 09/13/2021   HGBA1C 6.3 (A) 06/13/2021   HGBA1C 5.8 (A) 03/13/2021    Lab Results  Component Value Date   MICROALBUR 15.8 (H) 04/26/2020   Lab Results  Component  Value Date   CREATININE 1.11 04/27/2021   BUN 18 04/27/2021   NA 140 04/27/2021   K 4.2 04/27/2021   CL 101 04/27/2021   CO2 23 04/27/2021   Lab Results  Component Value Date   CHOL 122 07/24/2021   CHOL 145 04/27/2021   CHOL 131 04/26/2020   Lab Results  Component Value Date   HDL 51 07/24/2021   HDL 42 04/27/2021   HDL 37.90 (L) 04/26/2020   Lab Results  Component Value Date   LDLCALC 53 07/24/2021   LDLCALC 84 04/27/2021   LDLCALC 73  04/26/2020   Lab Results  Component Value Date   TRIG 94 07/24/2021   TRIG 101 04/27/2021   TRIG 100.0 04/26/2020   Lab Results  Component Value Date   CHOLHDL 2.4 07/24/2021   CHOLHDL 3.5 04/27/2021   CHOLHDL 3 04/26/2020   No results found for: LDLDIRECT The ASCVD Risk score (Arnett DK, et al., 2019) failed to calculate for the following reasons:   The patient has a prior MI or stroke diagnosis I have reviewed the [place], Fam and Soc history. Patient Active Problem List   Diagnosis Date Noted   History of MI (myocardial infarction) 04/26/2020    Priority: 1.    2001    Hx of CABG 04/26/2020    Priority: 1.    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    Neurology evaluating 08/2019; ? 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     Essential hypertension 10/05/2018    Priority: 1.   Type 2 diabetes mellitus with complication, without long-term current use of insulin ([place]) 08/12/2016    Priority: 1.    Low dose metformin due to GI side effects    Hypothyroidism (acquired) 05/29/2009    Priority: 1.   Hyperlipidemia associated with type 2 diabetes mellitus ([place]) 09/01/2007    Priority: 1.   CAD (coronary artery disease), native coronary artery 09/01/2007    Priority: 1.   Bruit 05/17/2021    Priority: 2.    Carotid dopplers 06/2021; bilateral minimal stenosis 1-39%    Bilateral primary osteoarthritis of knee 04/27/2020    Priority: 2.    xrays 04/2020, tricompartmental, worse medially    Diverticulosis 07/13/2019    Priority: 2.   GERD 09/01/2007    Priority: 2.   Peptic ulcer 09/01/2007    Priority: 2.   Former smoker 07/13/2019    Priority: 3.   Testosterone deficiency 09/01/2007    Priority: 3.   Unstable angina ([place]) 05/03/2021   Brachial neuritis or radiculitis, right 02/20/2009    Social History: Patient  reports that he quit smoking about 9 years ago. His smoking use included pipe and cigars. He has never used smokeless  tobacco. He reports current alcohol use. He reports that he does not use drugs.  Review of Systems: Ophthalmic: negative for eye pain, loss of vision or double vision Cardiovascular: negative for chest pain Respiratory: negative for SOB or persistent cough Gastrointestinal: negative for abdominal pain Genitourinary: negative for dysuria or gross hematuria MSK: negative for foot lesions Neurologic: negative for weakness or gait disturbance  Objective  Vitals: BP 128/88   Pulse 63   Temp 97.8 F (36.6 C) (Temporal)   Ht _0  (1.727 m)   Wt 195 lb 3.2 oz (88.5 kg)   SpO2 95%   BMI 29.68 kg/m  General: well appearing, no acute distress  Psych:  Alert and  oriented, normal mood and affect Cardiovascular:  Nl S1 and S2, RRR without murmur, gallop or rub. no edema Respiratory:  Good breath sounds bilaterally, CTAB with normal effort, no rales     Diabetic education: ongoing education regarding chronic disease management for diabetes was given today. We continue to reinforce the ABC's of diabetic management: A1c (<7 or 8 dependent upon patient), tight blood pressure control, and cholesterol management with goal LDL < 100 minimally. We discuss diet strategies, exercise recommendations, medication options and possible side effects. At each visit, we review recommended immunizations and preventive care recommendations for diabetics and stress that good diabetic control can prevent other problems. See below for this patient's data.   Commons side effects, risks, benefits, and alternatives for medications and treatment plan prescribed today were discussed, and the patient expressed understanding of the given instructions. Patient is instructed to call or message via MyChart if he/she has any questions or concerns regarding our treatment plan. No barriers to understanding were identified. We discussed Red Flag symptoms and signs in detail. Patient expressed understanding regarding what to do in  case of urgent or emergency type symptoms.  Medication list was reconciled, printed and provided to the patient in AVS. Patient instructions and summary information was reviewed with the patient as documented in the AVS. This note was prepared with assistance of Dragon voice recognition software. Occasional wrong-word or sound-a-like substitutions may have occurred due to the inherent limitations of voice recognition software  This visit occurred during the SARS-CoV-2 public health emergency.  Safety protocols were in place, including screening questions prior to the visit, additional usage of staff PPE, and extensive cleaning of exam room while observing appropriate contact time as indicated for disinfecting solutions.

## 2021-09-13 NOTE — Patient Instructions (Signed)
Please return in 3 months for diabetes follow up   If you have any questions or concerns, please don't hesitate to send me a message via MyChart or call the office at [phone]. Thank you for visiting with us today! It's our pleasure caring for you.  

## 2021-09-20 NOTE — Telephone Encounter (Signed)
Patient notified of Lpa results

## 2021-09-28 ENCOUNTER — Ambulatory Visit (INDEPENDENT_AMBULATORY_CARE_PROVIDER_SITE_OTHER): Payer: Medicare HMO

## 2021-09-28 DIAGNOSIS — Z0000 Encounter for general adult medical examination without abnormal findings: Secondary | ICD-10-CM

## 2021-09-28 NOTE — Progress Notes (Signed)
Virtual Visit via Telephone Note  I connected with  Quenton Fetter on 09/28/21 at 10:15 AM EDT by telephone and verified that I am speaking with the correct person using two identifiers.  Medicare Annual Wellness visit completed telephonically due to Covid-19 pandemic.   Persons participating in this call: This Health Coach and this patient.   Location: Patient: Home Provider: Office   I discussed the limitations, risks, security and privacy concerns of performing an evaluation and management service by telephone and the availability of in person appointments. The patient expressed understanding and agreed to proceed.  Unable to perform video visit due to video visit attempted and failed and/or patient does not have video capability.   Some vital signs may be absent or patient reported.   Willette Brace, LPN   Subjective:   Kaisen Ackers is a 75 y.o. male who presents for Medicare Annual/Subsequent preventive examination.  Review of Systems     Cardiac Risk Factors include: advanced age (>30mn, >>65women);diabetes mellitus;dyslipidemia;male gender;hypertension     Objective:    Today's Vitals   09/28/21 1011  PainSc: 3    There is no height or weight on file to calculate BMI.  Advanced Directives 09/28/2021 05/03/2021 09/04/2020 08/12/2019 04/21/2014 06/10/2012 06/05/2012  Does Patient Have a Medical Advance Directive? Yes Yes Yes No Patient does not have advance directive Patient has advance directive, copy not in chart Patient does not have advance directive;Patient would like information  Type of AScientist, forensicPower of AItmannLiving will [place]hawLiving will - - Living will;Healthcare Power of Attorney -  Does patient want to make changes to medical advance directive? - No - Patient declined - - - - -  Copy of [place] Chart? No - copy requested No - copy requested No - copy requested - - Copy  requested from family -  Would patient like information on creating a medical advance directive? - - - No - Patient declined - - Advance directive packet given  Pre-existing out of facility DNR order (yellow form or pink MOST form) - - - - - No No    Current Medications (verified) Outpatient Encounter Medications as of 09/28/2021  Medication Sig   aspirin EC 81 MG tablet Take 81 mg by mouth daily.   Blood Glucose Monitoring Suppl (ONETOUCH VERIO) w/Device KIT Use kit as directed.   ezetimibe (ZETIA) 10 MG tablet Take 10 mg by mouth daily.   glucose blood (ONETOUCH VERIO) test strip 1 each by Other route in the morning, at noon, and at bedtime. Check blood sugar up to 3 times daily   glucose blood test strip Use as instructed   isosorbide mononitrate (IMDUR) 120 MG 24 hr tablet Take 1 tablet (120 mg total) by mouth daily.   levothyroxine (SYNTHROID) 112 MCG tablet Take 1 tablet by mouth once daily   Melatonin ER 5 MG TBCR Take 5 mg by mouth at bedtime.   metFORMIN (GLUCOPHAGE-XR) 500 MG 24 hr tablet Take 1 tablet (500 mg total) by mouth in the morning and at bedtime.   metoprolol tartrate (LOPRESSOR) 25 MG tablet Take 1/2 (one-half) tablet by mouth twice daily   OneTouch Delica Lancets [id number] Check blood sugar up to 3 times daily.   rosuvastatin (CRESTOR) 40 MG tablet Take 1 tablet by mouth once daily (Patient taking differently: Take 40 mg by mouth daily.)   Scar Treatment Products (MEDERMA) GEL Apply 1 application topically 3 (  three) times a week.   vitamin B-12 (CYANOCOBALAMIN) 100 MCG tablet Take 100 mcg by mouth daily.   nitroGLYCERIN (NITROSTAT) 0.4 MG SL tablet Place 1 tablet (0.4 mg total) under the tongue every 5 (five) minutes as needed.   No facility-administered encounter medications on file as of 09/28/2021.    Allergies (verified) Oxycontin [oxycodone]   History: Past Medical History:  Diagnosis Date   Bilateral primary osteoarthritis of knee 04/27/2020   xrays 04/2020,  tricompartmental, worse medially   CAD (coronary artery disease), s/p MI - s/p PCI to RCA 2001;  [place] 7/13: 3v CAD, good LVF - >  s/p CABG 7/13 (L-LAD, S-OM1/dCFX, S-dRCA)    Diabetes mellitus type 2, controlled ([place])    Diverticulosis    Early cataracts, bilateral    GERD 09/01/2007   H/O hiatal hernia    HLD (hyperlipidemia), on statin    Hypothyroidism. 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   REM disorder   Testosterone deficiency    Past Surgical History:  Procedure Laterality Date   arm surgery  1957   d/t broken   CARDIAC CATHETERIZATION  2005; 06/01/2012   COLONOSCOPY  2005   CORONARY ANGIOPLASTY WITH STENT PLACEMENT  2001   RCA-1 stent   CORONARY ARTERY BYPASS GRAFT  06/09/2012   Procedure: CORONARY ARTERY BYPASS GRAFTING (CABG);  Surgeon: Grace Isaac, MD;  Location: [facility];  Service: Open Heart Surgery;  Laterality: N/A;  CABG x four;  using left internal mammary artery and right leg greater saphenous vein harvested endoscopically   ESOPHAGOGASTRODUODENOSCOPY     LEFT HEART CATH AND CORONARY ANGIOGRAPHY N/A 05/03/2021   Procedure: LEFT HEART CATH AND CORONARY ANGIOGRAPHY;  Surgeon: Martinique, Peter M, MD;  Location: [facility];  Service: Cardiovascular;  Laterality: N/A;   PERCUTANEOUS CORONARY STENT INTERVENTION (PCI-S) N/A 06/01/2012   Procedure: PERCUTANEOUS CORONARY STENT INTERVENTION (PCI-S);  Surgeon: Burnell Blanks, MD;  Location[facility] CATH [facility];  Service: Cardiovascular;  Laterality: N/A;   TONSILLECTOMY AND ADENOIDECTOMY  1955   at age 66   Family History  Problem Relation Age of Onset   Memory loss Father    Coronary artery disease Father 65       cabg   Colon cancer Neg Hx    Social History   Socioeconomic History   Marital status: Married    Spouse name: Not on file   Number of children: Not on file   Years of education: Not on  file   Highest education level: Not on file  Occupational History   Occupation: Retired  Tobacco Use   Smoking status: Former    Types: Pipe, Cigars    Quit date: 06/01/2012    Years since quitting: 9.3   Smokeless tobacco: Never   Tobacco comments:    patient states that he is not somking the pipe or cigars any more  Substance and Sexual Activity   Alcohol use: Yes    Comment: rare   Drug use: No   Sexual activity: Yes  Other Topics Concern   Not on file  Social History Narrative   Not on file   Social Determinants of Health   Financial Resource Strain: Low Risk    Difficulty of Paying Living Expenses: Not hard at all  Food Insecurity: No Food Insecurity   Worried  About Running Out of Food in the Last Year: Never true   Ran Out of Food in the Last Year: Never true  Transportation Needs: No Transportation Needs   Lack of Transportation (Medical): No   Lack of Transportation (Non-Medical): No  Physical Activity: Insufficiently Active   Days of Exercise per Week: 3 days   Minutes of Exercise per Session: 30 min  Stress: No Stress Concern Present   Feeling of Stress : Not at all  Social Connections: Moderately Integrated   Frequency of Communication with Friends and Family: More than three times a week   Frequency of Social Gatherings with Friends and Family: More than three times a week   Attends Religious Services: More than 4 times per year   Active Member of [employer] or Organizations: No   Attends Music therapist: Never   Marital Status: Married    Tobacco Counseling Counseling given: Not Answered Tobacco comments: patient states that he is not somking the pipe or cigars any more   Clinical Intake:  Pre-visit preparation completed: Yes  Pain : 0-10 Pain Score: 3  Pain Type: Chronic pain Pain Location: Knee Pain Orientation: Left, Right Pain Descriptors / Indicators: Aching, Dull Pain Onset: More than a month ago Pain Frequency: Intermittent      BMI - recorded: 29.69 Nutritional Status: BMI 25 -29 Overweight Diabetes: Yes CBG done?: No Did pt. bring in CBG monitor from home?: No  How often do you need to have someone help you when you read instructions, pamphlets, or other written materials from your doctor or pharmacy?: 1 - Never  Diabetic?Nutrition Risk Assessment:  Has the patient had any N/V/D within the last 2 months?  No  Does the patient have any non-healing wounds?  No  Has the patient had any unintentional weight loss or weight gain?  No   Diabetes:  Is the patient diabetic?  Yes  If diabetic, was a CBG obtained today?  No  Did the patient bring in their glucometer from home?  No  How often do you monitor your CBG's? As needed .   Financial Strains and Diabetes Management:  Are you having any financial strains with the device, your supplies or your medication? No .  Does the patient want to be seen by Chronic Care Management for management of their diabetes?  No  Would the patient like to be referred to a Nutritionist or for Diabetic Management?  No   Diabetic Exams:  Diabetic Eye Exam: Overdue for diabetic eye exam. Pt has been advised about the importance in completing this exam. Patient advised to call and schedule an eye exam. Diabetic Foot Exam: Completed 05/02/21   Interpreter Needed?: No  Information entered by :: Charlott Rakes, LPN   Activities of Daily Living In your present state of health, do you have any difficulty performing the following activities: 09/28/2021  Hearing? Y  Comment slight loss  Vision? N  Difficulty concentrating or making decisions? Y  Comment short term  Walking or climbing stairs? Y  Comment related to knees  Dressing or bathing? N  Doing errands, shopping? N  Preparing Food and eating ? N  Using the Toilet? N  In the past six months, have you accidently leaked urine? Y  Comment wears depends  Do you have problems with loss of bowel control? N  Managing your  Medications? N  Managing your Finances? N  Housekeeping or managing your Housekeeping? N  Some recent data might be hidden  Patient Care Team: Leamon Arnt, MD as PCP - General (Family Medicine) Minus Breeding, MD as PCP - Cardiology (Cardiology) Grace Isaac, MD (Inactive) as Consulting Physician (Cardiothoracic Surgery) Allyn Kenner, MD (Dermatology) Minus Breeding, MD as Consulting Physician (Cardiology) Dohmeier, Asencion Partridge, MD as Consulting Physician (Neurology) Madelin Rear, [facility] as Pharmacist (Pharmacist)  Indicate any recent Medical Services you may have received from other than [facility] providers in the past year (date may be approximate).     Assessment:   This is a routine wellness examination for Amber.  Hearing/Vision screen Hearing Screening - Comments:: Pt some slight loss  Vision Screening - Comments:: Pt follows up with Dr Nicki Reaper for annual eye exams   Dietary issues and exercise activities discussed: Current Exercise Habits: Home exercise routine, Time (Minutes): 30, Frequency (Times/Week): 3, Weekly Exercise (Minutes/Week): 90   Goals Addressed             This Visit's Progress    Patient Stated       Keep sugar level down        Depression Screen PHQ 2/9 Scores 09/28/2021 09/13/2021 09/04/2020 04/26/2020 08/12/2019 01/19/2019 01/12/2019  PHQ - 2 Score 0 0 0 1 0 0 0  PHQ- 9 Score - - - - - 2 3    Fall Risk Fall Risk  09/28/2021 09/13/2021 09/04/2020 04/26/2020 08/24/2019  Falls in the past year? 0 0 0 0 0  Number falls in past yr: 0 0 0 0 -  Injury with Fall? 0 0 0 0 -  Risk for fall due to : Impaired vision;Impaired mobility - Impaired vision;Impaired mobility - -  Follow up Falls prevention discussed - - - -    FALL RISK PREVENTION PERTAINING TO THE HOME:  Any stairs in or around the home? Yes  If so, are there any without handrails? No  Home free of loose throw rugs in walkways, pet beds, electrical cords, etc? Yes  Adequate lighting in your  home to reduce risk of falls? Yes   ASSISTIVE DEVICES UTILIZED TO PREVENT FALLS:  Life alert? No  Use of a cane, walker or w/c? No  Grab bars in the bathroom? Yes  Shower chair or bench in shower? No  Elevated toilet seat or a handicapped toilet? No   TIMED UP AND GO:  Was the test performed? No .  Cognitive Function:     6CIT Screen 09/28/2021 09/04/2020  What Year? 0 points 0 points  What month? 0 points 0 points  What time? 0 points -  Count back from 20 0 points 0 points  Months in reverse 0 points 0 points  Repeat phrase 0 points 0 points  Total Score 0 -    Immunizations Immunization History  Administered Date(s) Administered   Fluad Quad(high Dose 65+) 09/16/2019, 09/05/2020, 09/13/2021   Influenza Split 11/01/2011, 09/22/2012   Influenza Whole 09/22/2009, 09/20/2010   Influenza, High Dose Seasonal PF 10/03/2015, 11/11/2016, 10/13/2017, 11/20/2018   Influenza,inj,Quad PF,6+ Mos 09/16/2013, 09/29/2014   PFIZER(Purple Top)SARS-COV-2 Vaccination 02/15/2020, 03/07/2020, 10/20/2020   Pneumococcal Conjugate-13 02/25/2014   Pneumococcal Polysaccharide-23 09/01/2007, 07/31/2011   Td 09/01/2007   Zoster Recombinat (Shingrix) 08/10/2020, 10/10/2020      Flu Vaccine status: Up to date  Pneumococcal vaccine status: Up to date  Covid-19 vaccine status: Completed vaccines  Qualifies for Shingles Vaccine? Yes   Zostavax completed Yes   Shingrix Completed?: Yes  Screening Tests Health Maintenance  Topic Date Due   COVID-19 Vaccine (4 - Booster for [employer]  series) 12/15/2020   OPHTHALMOLOGY EXAM  01/29/2022 (Originally 12/27/2020)   HEMOGLOBIN A1C  03/14/2022   FOOT EXAM  05/02/2022   URINE MICROALBUMIN  09/13/2022   Pneumonia Vaccine 15+ Years old  Completed   INFLUENZA VACCINE  Completed   Hepatitis C Screening  Completed   Zoster Vaccines- Shingrix  Completed   HPV VACCINES  Aged Out   TETANUS/TDAP  Discontinued    Health Maintenance  Health Maintenance  Due  Topic Date Due   COVID-19 Vaccine (4 - Booster for [employer] series) 12/15/2020    Colorectal cancer screening: No longer required.    Additional Screening:  Hepatitis C Screening:  Completed 07/07/18  Vision Screening: Recommended annual ophthalmology exams for early detection of glaucoma and other disorders of the eye. Is the patient up to date with their annual eye exam?  Yes  Who is the provider or what is the name of the office in which the patient attends annual eye exams? Dr Nicki Reaper  If pt is not established with a provider, would they like to be referred to a provider to establish care? No .   Dental Screening: Recommended annual dental exams for proper oral hygiene  Community Resource Referral / Chronic Care Management: CRR required this visit?  No   CCM required this visit?  No      Plan:     I have personally reviewed and noted the following in the patient's chart:   Medical and social history Use of alcohol, tobacco or illicit drugs  Current medications and supplements including opioid prescriptions. Patient is not currently taking opioid prescriptions. Functional ability and status Nutritional status Physical activity Advanced directives List of other physicians Hospitalizations, surgeries, and ER visits in previous 12 months Vitals Screenings to include cognitive, depression, and falls Referrals and appointments  In addition, I have reviewed and discussed with patient certain preventive protocols, quality metrics, and best practice recommendations. A written personalized care plan for preventive services as well as general preventive health recommendations were provided to patient.     Willette Brace, LPN   [id number]   Nurse Notes: None

## 2021-09-28 NOTE — Patient Instructions (Addendum)
Adrian Gamble , Thank you for taking time to come for your Medicare Wellness Visit. I appreciate your ongoing commitment to your health goals. Please review the following plan we discussed and let me know if I can assist you in the future.   Screening recommendations/referrals: Colonoscopy: No longer required  Recommended yearly ophthalmology/optometry visit for glaucoma screening and checkup Recommended yearly dental visit for hygiene and checkup  Vaccinations: Influenza vaccine: Done 09/13/21 repeat every year  Pneumococcal vaccine: Up to date Tdap vaccine: Discontinued  Shingles vaccine: Completed 9/9, & 10/10/20   Covid-19: Completed 3/16, 4/6, & 10/20/20  Advanced directives: Please bring a copy of your health care power of attorney and living will to the office at your convenience.  Conditions/risks identified: keep  sugar level down   Next appointment: Follow up in one year for your annual wellness visit.   Preventive Care 67 Years and Older, Male Preventive care refers to lifestyle choices and visits with your health care provider that can promote health and wellness. What does preventive care include? A yearly physical exam. This is also called an annual well check. Dental exams once or twice a year. Routine eye exams. Ask your health care provider how often you should have your eyes checked. Personal lifestyle choices, including: Daily care of your teeth and gums. Regular physical activity. Eating a healthy diet. Avoiding tobacco and drug use. Limiting alcohol use. Practicing safe sex. Taking low doses of aspirin every day. Taking vitamin and mineral supplements as recommended by your health care provider. What happens during an annual well check? The services and screenings done by your health care provider during your annual well check will depend on your age, overall health, lifestyle risk factors, and family history of disease. Counseling  Your health care provider may  ask you questions about your: Alcohol use. Tobacco use. Drug use. Emotional well-being. Home and relationship well-being. Sexual activity. Eating habits. History of falls. Memory and ability to understand (cognition). Work and work Statistician. Screening  You may have the following tests or measurements: Height, weight, and BMI. Blood pressure. Lipid and cholesterol levels. These may be checked every 5 years, or more frequently if you are over 12 years old. Skin check. Lung cancer screening. You may have this screening every year starting at age 57 if you have a 30-pack-year history of smoking and currently smoke or have quit within the past 15 years. Fecal occult blood test (FOBT) of the stool. You may have this test every year starting at age 72. Flexible sigmoidoscopy or colonoscopy. You may have a sigmoidoscopy every 5 years or a colonoscopy every 10 years starting at age 66. Prostate cancer screening. Recommendations will vary depending on your family history and other risks. Hepatitis C blood test. Hepatitis B blood test. Sexually transmitted disease (STD) testing. Diabetes screening. This is done by checking your blood sugar (glucose) after you have not eaten for a while (fasting). You may have this done every 1-3 years. Abdominal aortic aneurysm (AAA) screening. You may need this if you are a current or former smoker. Osteoporosis. You may be screened starting at age 60 if you are at high risk. Talk with your health care provider about your test results, treatment options, and if necessary, the need for more tests. Vaccines  Your health care provider may recommend certain vaccines, such as: Influenza vaccine. This is recommended every year. Tetanus, diphtheria, and acellular pertussis (Tdap, Td) vaccine. You may need a Td booster every 10 years. Zoster vaccine.  You may need this after age 71. Pneumococcal 13-valent conjugate (PCV13) vaccine. One dose is recommended after age  30. Pneumococcal polysaccharide (PPSV23) vaccine. One dose is recommended after age 44. Talk to your health care provider about which screenings and vaccines you need and how often you need them. This information is not intended to replace advice given to you by your health care provider. Make sure you discuss any questions you have with your health care provider. Document Released: 12/15/2015 Document Revised: 08/07/2016 Document Reviewed: 09/19/2015 Elsevier Interactive Patient Education  2017 [place] Prevention in the Home Falls can cause injuries. They can happen to people of all ages. There are many things you can do to make your home safe and to help prevent falls. What can I do on the outside of my home? Regularly fix the edges of walkways and driveways and fix any cracks. Remove anything that might make you trip as you walk through a door, such as a raised step or threshold. Trim any bushes or trees on the path to your home. Use bright outdoor lighting. Clear any walking paths of anything that might make someone trip, such as rocks or tools. Regularly check to see if handrails are loose or broken. Make sure that both sides of any steps have handrails. Any raised decks and porches should have guardrails on the edges. Have any leaves, snow, or ice cleared regularly. Use sand or salt on walking paths during winter. Clean up any spills in your garage right away. This includes oil or grease spills. What can I do in the bathroom? Use night lights. Install grab bars by the toilet and in the tub and shower. Do not use towel bars as grab bars. Use non-skid mats or decals in the tub or shower. If you need to sit down in the shower, use a plastic, non-slip stool. Keep the floor dry. Clean up any water that spills on the floor as soon as it happens. Remove soap buildup in the tub or shower regularly. Attach bath mats securely with double-sided non-slip rug tape. Do not have throw  rugs and other things on the floor that can make you trip. What can I do in the bedroom? Use night lights. Make sure that you have a light by your bed that is easy to reach. Do not use any sheets or blankets that are too big for your bed. They should not hang down onto the floor. Have a firm chair that has side arms. You can use this for support while you get dressed. Do not have throw rugs and other things on the floor that can make you trip. What can I do in the kitchen? Clean up any spills right away. Avoid walking on wet floors. Keep items that you use a lot in easy-to-reach places. If you need to reach something above you, use a strong step stool that has a grab bar. Keep electrical cords out of the way. Do not use floor polish or wax that makes floors slippery. If you must use wax, use non-skid floor wax. Do not have throw rugs and other things on the floor that can make you trip. What can I do with my stairs? Do not leave any items on the stairs. Make sure that there are handrails on both sides of the stairs and use them. Fix handrails that are broken or loose. Make sure that handrails are as long as the stairways. Check any carpeting to make sure that it is  firmly attached to the stairs. Fix any carpet that is loose or worn. Avoid having throw rugs at the top or bottom of the stairs. If you do have throw rugs, attach them to the floor with carpet tape. Make sure that you have a light switch at the top of the stairs and the bottom of the stairs. If you do not have them, ask someone to add them for you. What else can I do to help prevent falls? Wear shoes that: Do not have high heels. Have rubber bottoms. Are comfortable and fit you well. Are closed at the toe. Do not wear sandals. If you use a stepladder: Make sure that it is fully opened. Do not climb a closed stepladder. Make sure that both sides of the stepladder are locked into place. Ask someone to hold it for you, if  possible. Clearly mark and make sure that you can see: Any grab bars or handrails. First and last steps. Where the edge of each step is. Use tools that help you move around (mobility aids) if they are needed. These include: Canes. Walkers. Scooters. Crutches. Turn on the lights when you go into a dark area. Replace any light bulbs as soon as they burn out. Set up your furniture so you have a clear path. Avoid moving your furniture around. If any of your floors are uneven, fix them. If there are any pets around you, be aware of where they are. Review your medicines with your doctor. Some medicines can make you feel dizzy. This can increase your chance of falling. Ask your doctor what other things that you can do to help prevent falls. This information is not intended to replace advice given to you by your health care provider. Make sure you discuss any questions you have with your health care provider. Document Released: 09/14/2009 Document Revised: 04/25/2016 Document Reviewed: 12/23/2014 Elsevier Interactive Patient Education  2017 [employer].

## 2021-10-02 ENCOUNTER — Telehealth: Payer: Self-pay | Admitting: Pharmacist

## 2021-10-02 NOTE — Chronic Care Management (AMB) (Signed)
Chronic Care Management Pharmacy Assistant   Name: Adrian Gamble  MRN: [id number] DOB: 1946-05-06   Reason for Encounter: Diabetes Adherence Call    Recent office visits:  09/13/2021 [facility] (PCP) Leamon Arnt, MD; Diabetes is currently adequately controlled. But worsening hyperglycemia over last month.... will see if he can tolerate met er 500 bid; titrate up slowly to avoid GI SE. Monitor   Recent consult visits:  09/04/2021 [facility] (cardiology) Minus Breeding MD; CAD :   He still has a little bit of chest discomfort but is much improved.  I have convinced him to try 120 mg of [facility] visits:  None in previous 6 months  Medications: Outpatient Encounter Medications as of 10/02/2021  Medication Sig   aspirin EC 81 MG tablet Take 81 mg by mouth daily.   Blood Glucose Monitoring Suppl (ONETOUCH VERIO) w/Device KIT Use kit as directed.   ezetimibe (ZETIA) 10 MG tablet Take 10 mg by mouth daily.   glucose blood (ONETOUCH VERIO) test strip 1 each by Other route in the morning, at noon, and at bedtime. Check blood sugar up to 3 times daily   glucose blood test strip Use as instructed   isosorbide mononitrate (IMDUR) 120 MG 24 hr tablet Take 1 tablet (120 mg total) by mouth daily.   levothyroxine (SYNTHROID) 112 MCG tablet Take 1 tablet by mouth once daily   Melatonin ER 5 MG TBCR Take 5 mg by mouth at bedtime.   metFORMIN (GLUCOPHAGE-XR) 500 MG 24 hr tablet Take 1 tablet (500 mg total) by mouth in the morning and at bedtime.   metoprolol tartrate (LOPRESSOR) 25 MG tablet Take 1/2 (one-half) tablet by mouth twice daily   nitroGLYCERIN (NITROSTAT) 0.4 MG SL tablet Place 1 tablet (0.4 mg total) under the tongue every 5 (five) minutes as needed.   OneTouch Delica Lancets 23V MISC Check blood sugar up to 3 times daily.   rosuvastatin (CRESTOR) 40 MG tablet Take 1 tablet by mouth once daily (Patient taking differently: Take 40 mg by mouth daily.)   Scar Treatment Products (MEDERMA) GEL Apply  1 application topically 3 (three) times a week.   vitamin B-12 (CYANOCOBALAMIN) 100 MCG tablet Take 100 mcg by mouth daily.   No facility-administered encounter medications on file as of 10/02/2021.   Recent Relevant Labs: Lab Results  Component Value Date/Time   HGBA1C 6.4 (A) 09/13/2021 10:00 AM   HGBA1C 6.3 (A) 06/13/2021 11:27 AM   HGBA1C 6.7 (H) 08/13/2019 02:01 PM   HGBA1C 6.1 07/07/2017 08:58 AM   MICROALBUR 2.1 (H) 09/13/2021 10:31 AM   MICROALBUR 15.8 (H) 04/26/2020 12:20 PM    Kidney Function Lab Results  Component Value Date/Time   CREATININE 1.07 09/13/2021 10:31 AM   CREATININE 1.11 04/27/2021 08:44 AM   GFR 67.17 09/13/2021 10:31 AM   GFRNONAA 87 (L) 06/12/2012 06:00 AM   GFRAA >90 06/12/2012 06:00 AM    Current antihyperglycemic regimen:  Metformin ER 500 mg twice daily  What recent interventions/DTPs have been made to improve glycemic control:  Patient recently started back taking Metformin ER 500 mg twice daily. He started out taking it once a day and states he recently started taking it twice daily. He denies any issues or side effects with any of his medications. Patient also states his chest pain has improved since he started taking 120 mg of Imdur.  Have there been any recent hospitalizations or ED visits since last visit with CPP? No  Patient denies  hypoglycemic symptoms.  Patient denies hyperglycemic symptoms.  How often are you checking your blood sugar? twice daily  What are your blood sugars ranging?  Fasting: 97, 120, 205 After meals: 97, 170  During the week, how often does your blood glucose drop below 70? Never  Are you checking your feet daily/regularly? Yes, patient states he is checking his feet regularly.  Adherence Review: Is the patient currently on a STATIN medication? Yes Is the patient currently on ACE/ARB medication? No Does the patient have >5 day gap between last estimated fill dates? No   Care Gaps: Medicare Annual  Wellness: Completed Ophthalmology Exam: Postponed until 01/29/2022 Foot Exam: Next due on 05/02/2022 Hemoglobin A1C: 6.4% on 09/13/2021 Colonoscopy: Aged out, last completed 05/05/2014  Future Appointments  Date Time Provider [place]  12/21/2021 10:30 AM Leamon Arnt, MD [facility]  12/25/2021  9:00 AM [facility] CCM PHARMACIST [facility]  10/11/2022 10:15 AM [facility] HEALTH COACH [facility]    Star Rating Drugs: Rosuvastatin 40 mg last filled 09/07/2021 90 DS Metformin ER 500 mg last filled 08/30/2021 90 DS  April D Calhoun, [place] Pharmacist Assistant [phone]

## 2021-10-19 ENCOUNTER — Other Ambulatory Visit: Payer: Self-pay | Admitting: Family Medicine

## 2021-10-28 ENCOUNTER — Other Ambulatory Visit: Payer: Self-pay | Admitting: Family Medicine

## 2021-10-28 DIAGNOSIS — E118 Type 2 diabetes mellitus with unspecified complications: Secondary | ICD-10-CM

## 2021-11-08 ENCOUNTER — Other Ambulatory Visit: Payer: Self-pay | Admitting: Family Medicine

## 2021-11-08 DIAGNOSIS — E118 Type 2 diabetes mellitus with unspecified complications: Secondary | ICD-10-CM

## 2021-11-08 DIAGNOSIS — Z23 Encounter for immunization: Secondary | ICD-10-CM | POA: Diagnosis not present

## 2021-11-08 DIAGNOSIS — L570 Actinic keratosis: Secondary | ICD-10-CM | POA: Diagnosis not present

## 2021-11-09 ENCOUNTER — Telehealth: Payer: Self-pay

## 2021-11-09 NOTE — Telephone Encounter (Signed)
Spoke with patient, gave a verbal understanding °

## 2021-11-09 NOTE — Telephone Encounter (Signed)
Pt called regarding covid boosters. He stated that him and his wife had covid about 3 weeks ago. Pt wants to know if he should still get the booster. Pt wants a call back to discuss. Please Advise.

## 2021-11-16 ENCOUNTER — Other Ambulatory Visit: Payer: Self-pay | Admitting: Family Medicine

## 2021-11-16 DIAGNOSIS — E118 Type 2 diabetes mellitus with unspecified complications: Secondary | ICD-10-CM

## 2021-12-04 DIAGNOSIS — H5203 Hypermetropia, bilateral: Secondary | ICD-10-CM | POA: Diagnosis not present

## 2021-12-11 ENCOUNTER — Other Ambulatory Visit: Payer: Self-pay | Admitting: Family Medicine

## 2021-12-11 DIAGNOSIS — E118 Type 2 diabetes mellitus with unspecified complications: Secondary | ICD-10-CM

## 2021-12-11 NOTE — Progress Notes (Signed)
Chronic Care Management Pharmacy Note  12/25/2021 Name:  Adrian Gamble MRN:  [id number] DOB:  1946/02/22  Subjective: Adrian Gamble is an 76 y.o. year old male who is a primary patient of Leamon Arnt, MD.  The CCM team was consulted for assistance with disease management and care coordination needs.    Engaged with patient by telephone for follow up visit in response to provider referral for pharmacy case management and/or care coordination services.   Consent to Services:  The patient was given the following information about Chronic Care Management services today, agreed to services, and gave verbal consent: 1. CCM service includes personalized support from designated clinical staff supervised by the primary care provider, including individualized plan of care and coordination with other care providers 2. 24/7 contact phone numbers for assistance for urgent and routine care needs. 3. Service will only be billed when office clinical staff spend 20 minutes or more in a month to coordinate care. 4. Only one practitioner may furnish and bill the service in a calendar month. 5.The patient may stop CCM services at any time (effective at the end of the month) by phone call to the office staff. 6. The patient will be responsible for cost sharing (co-pay) of up to 20% of the service fee (after annual deductible is met). Patient agreed to services and consent obtained.  Patient Care Team: Leamon Arnt, MD as PCP - General (Family Medicine) Minus Breeding, MD as PCP - Cardiology (Cardiology) Grace Isaac, MD (Inactive) as Consulting Physician (Cardiothoracic Surgery) Allyn Kenner, MD (Dermatology) Minus Breeding, MD as Consulting Physician (Cardiology) Dohmeier, Asencion Partridge, MD as Consulting Physician (Neurology) Edythe Clarity, [facility] (Pharmacist)  Hospital visits: None in previous 6 months  Objective:  Lab Results  Component Value Date   CREATININE 1.07 09/13/2021   CREATININE 1.11  04/27/2021   CREATININE 1.05 04/26/2020    Lab Results  Component Value Date   HGBA1C 6.4 (A) 09/13/2021   Last diabetic Eye exam:  Lab Results  Component Value Date/Time   HMDIABEYEEXA No Retinopathy 02/11/2019 12:00 AM    Last diabetic Foot exam: No results found for: HMDIABFOOTEX      Component Value Date/Time   CHOL 103 09/13/2021 1031   CHOL 122 07/24/2021 1006   TRIG 102.0 09/13/2021 1031   HDL 40.00 09/13/2021 1031   HDL 51 07/24/2021 1006   CHOLHDL 3 09/13/2021 1031   VLDL 20.4 09/13/2021 1031   LDLCALC 43 09/13/2021 1031   LDLCALC 53 07/24/2021 1006    Hepatic Function Latest Ref Rng & Units 09/13/2021 05/02/2021 04/26/2020  Total Protein 6.0 - 8.3 g/dL 7.1 7.6 6.7  Albumin 3.5 - 5.2 g/dL 4.4 4.5 4.4  AST 0 - 37 U/L '18 18 17  ' ALT 0 - 53 U/L '14 12 18  ' Alk Phosphatase 39 - 117 U/L 45 57 54  Total Bilirubin 0.2 - 1.2 mg/dL 1.1 0.4 0.6  Bilirubin, Direct 0.0 - 0.3 mg/dL - 0.1 -    Lab Results  Component Value Date/Time   TSH 0.78 05/02/2021 02:20 PM   TSH 1.88 06/19/2020 10:17 AM   FREET4 1.33 07/19/2019 09:24 AM   FREET4 1.28 05/03/2019 08:24 AM    CBC Latest Ref Rng & Units 04/27/2021 04/26/2020 08/13/2019  WBC 3.4 - 10.8 x10E3/uL 4.5 5.7 6.4  Hemoglobin 13.0 - 17.7 g/dL 13.3 14.6 15.1  Hematocrit 37.5 - 51.0 % 39.5 43.4 43.8  Platelets 150 - 450 x10E3/uL 255 150.0 134.0(L)  No results found for: VD25OH  Clinical ASCVD: Yes  The ASCVD Risk score (Arnett DK, et al., 2019) failed to calculate for the following reasons:   The patient has a prior MI or stroke diagnosis    Other: (CHADS2VASc if Afib, PHQ9 if depression, MMRC or CAT for COPD, ACT, DEXA)  Social History   Tobacco Use  Smoking Status Former   Types: Pipe, Cigars   Quit date: 06/01/2012   Years since quitting: 9.5  Smokeless Tobacco Never  Tobacco Comments   patient states that he is not somking the pipe or cigars any more   BP Readings from Last 3 Encounters:  09/13/21 128/88   09/04/21 108/62  06/13/21 122/76   Pulse Readings from Last 3 Encounters:  09/13/21 63  09/04/21 61  06/13/21 64   Wt Readings from Last 3 Encounters:  09/13/21 195 lb 3.2 oz (88.5 kg)  09/04/21 199 lb (90.3 kg)  06/13/21 194 lb 12.8 oz (88.4 kg)    Assessment: Review of patient past medical history, allergies, medications, health status, including review of consultants reports, laboratory and other test data, was performed as part of comprehensive evaluation and provision of chronic care management services.   SDOH:  (Social Determinants of Health) assessments and interventions performed: Yes   CCM Care Plan  Allergies  Allergen Reactions   Oxycontin [Oxycodone] Nausea Only    Any Contin    Medications Reviewed Today     Reviewed by Edythe Clarity, [facility] (Pharmacist) on 12/25/21 at 0921  Med List Status: <None>   Medication Order Taking? Sig Documenting Provider Last Dose Status Informant  aspirin EC 81 MG tablet [id number] No Take 81 mg by mouth daily. [provider] Taking Active Self  Blood Glucose Monitoring Suppl [facility] VERIO) w/Device KIT [id number] No Use kit as directed. Leamon Arnt, MD Taking Active   ezetimibe (ZETIA) 10 MG tablet [id number] No Take 10 mg by mouth daily. [provider] Taking Active   glucose blood test strip [id number] No Use as instructed Leamon Arnt, MD Taking Active   isosorbide mononitrate (IMDUR) 120 MG 24 hr tablet [id number] No Take 1 tablet (120 mg total) by mouth daily. Minus Breeding, MD Taking Active   levothyroxine (SYNTHROID) 112 MCG tablet [id number]  Take 1 tablet by mouth once daily Leamon Arnt, MD  Active   Melatonin ER 5 MG TBCR [id number] No Take 5 mg by mouth at bedtime. [provider] Taking Active Self  metFORMIN (GLUCOPHAGE-XR) 500 MG 24 hr tablet [id number]  Take 1 tablet by mouth once daily with breakfast Leamon Arnt, MD  Active            Med Note Rosana Hoes, [place]   Tue Dec 25, 2021  9:04 AM) Taking twice daily  metoprolol tartrate (LOPRESSOR) 25 MG tablet [id number] No Take 1/2 (one-half) tablet by mouth twice daily Leamon Arnt, MD Taking Active   nitroGLYCERIN (NITROSTAT) 0.4 MG SL tablet [id number] No Place 1 tablet (0.4 mg total) under the tongue every 5 (five) minutes as needed. Minus Breeding, MD Taking Expired 09/04/21 2359 Self  OneTouch Delica Lancets 83M MISC [id number] No Check blood sugar up to 3 times daily. Leamon Arnt, MD Taking Active   [place] VERIO test strip [id number]  USE 1 STRIP TO CHECK GLUCOSE IN THE MORNING, AT NOON AND AT BEDTIME UP TO 3 TIMES DAILY Leamon Arnt, MD  Active   rosuvastatin (CRESTOR) 40 MG tablet [id number] No  Take 1 tablet by mouth once daily  Patient taking differently: Take 40 mg by mouth daily.   Leamon Arnt, MD Taking Active Self  Scar Treatment Products [facility]) GEL [id number] No Apply 1 application topically 3 (three) times a week. [provider] Taking Active Self  vitamin B-12 (CYANOCOBALAMIN) 100 MCG tablet [id number] No Take 100 mcg by mouth daily. [provider] Taking Active Self            Patient Active Problem List   Diagnosis Date Noted   Bruit 05/17/2021   Unstable angina ([place]) 05/03/2021   Bilateral primary osteoarthritis of knee 04/27/2020   History of MI (myocardial infarction) 04/26/2020   Hx of CABG 04/26/2020   Sleep behavior disorder, REM 09/16/2019   Diverticulosis 07/13/2019   Former smoker 07/13/2019   Essential hypertension 10/05/2018   Type 2 diabetes mellitus with complication, without long-term current use of insulin ([place]) 08/12/2016   Hypothyroidism (acquired) 05/29/2009   Brachial neuritis or radiculitis, right 02/20/2009   Testosterone deficiency 09/01/2007   Hyperlipidemia associated with type 2 diabetes mellitus ([place]) 09/01/2007   CAD (coronary artery disease), native coronary artery 09/01/2007   GERD 09/01/2007   Peptic ulcer 09/01/2007     Immunization History  Administered Date(s) Administered   Fluad Quad(high Dose 65+) 09/16/2019, 09/05/2020, 09/13/2021   Influenza Split 11/01/2011, 09/22/2012   Influenza Whole 09/22/2009, 09/20/2010   Influenza, High Dose Seasonal PF 10/03/2015, 11/11/2016, 10/13/2017, 11/20/2018   Influenza,inj,Quad PF,6+ Mos 09/16/2013, 09/29/2014   PFIZER(Purple Top)SARS-COV-2 Vaccination 02/15/2020, 03/07/2020, 10/20/2020   Pneumococcal Conjugate-13 02/25/2014   Pneumococcal Polysaccharide-23 09/01/2007, 07/31/2011   Td 09/01/2007   Zoster Recombinat (Shingrix) 08/10/2020, 10/10/2020    Conditions to be addressed/monitored: HTN, DM II, CAD - hx of MI, hypothyroidism, GERD - peptic ulcer, former smoker, OA  Care Plan : [place]  Updates made by Edythe Clarity, RPH since 12/25/2021 12:00 AM     Problem: HTN, DM II, CAD - hx of MI, hypothyroidism, GERD - peptic ulcer, former smoker, OA   Priority: High     Long-Range Goal: Disease Management   Start Date: 04/23/2021  Expected End Date: 04/23/2022  Recent Progress: On track  Priority: High  Note:   Current Barriers:  Elevated fasting glucose  Pharmacist Clinical Goal(s):  Patient will contact provider office for questions/concerns as evidenced notation of same in electronic health record through collaboration with PharmD and provider.   Interventions: 1:1 collaboration with Leamon Arnt, MD regarding development and update of comprehensive plan of care as evidenced by provider attestation and co-signature Inter-disciplinary care team collaboration (see longitudinal plan of care) Comprehensive medication review performed; medication list updated in electronic medical record No Rx Changes  Hypertension (BP goal <130/80) -Controlled -Secondary prevention  -Current treatment: Metoprolol tartrate 25 mg tablet - 1/2 tablet (12.5 mg twice daily) -Current home readings: never any high BP, consistently at goal  -Denies  hypotensive/hypertensive symptoms -Educated on Exercise goal of 150 minutes per week; -Counseled to monitor BP at home at least once every 1-2 weeks, document, and provide log at future appointments -Counseled on diet and exercise extensively  Hyperlipidemia: (LDL goal < 70) -Controlled, near goal -Current treatment: Rosuvastatin 40 mg once daily Appropriate, Effective, Safe, Accessible -Medications previously tried: atorvastatin 80 mg, simvastatin 40 mg   -Reviewed side effects - no problems noted -Educated on Cholesterol goals;  -Recommended to continue current medication  Update 12/25/21 Continues to take daily. Most recent LDL is excellent Denies adverse  effects. No need for changes at this time, continue routine screenings  Diabetes (A1c goal <7%) -Controlled, last a1c 5.8% when on both metformin and farxiga -Current medications: Metformin XR 500 mg twice daily Appropriate, Query effective  -Medications previously tried: farxiga 10 mg (recurrent balanitis - stopped 03/13/2021), metformin 1000 mg/day (diarrhea) -Current home glucose readings fasting glucose: 105-140 post prandial glucose: <200 -Denies hypoglycemic/hyperglycemic symptoms -Current meal patterns: occasional sweets, eggs for breakfast. Drinks: coffee, coke zero, water. No alcohol.  -Current exercise: walking routinely, projects like sanding the car. Trying to get back to the [employer] -Educated on A1c and blood sugar goals; -Recommended to continue current medication. Pt preference to optimize DM with metformin + exercise, GLP could be considered due to CV benefit   -CPE already scheduled EOM  Update 12/25/21 140 was lowest it has been at home recently per patient. He mentions diet as follows: B: egg scramble with peppers and cheese, occasional cereal or oatmeal L: D: eat out [place] burrito, refried beans Drinks: water, coke zero, black coffee Snack: ice cream sandwich occasionally Has upcoming visit with  PCP. Reports he is currently taking metformin XR 524m BID. If A1c elevated would recommend maximizing metformin therapy and encouraging him to limit carbohydrate servings in meals, especially dinners. Could also consider GLP-1 for CV benefit, however his preference has always been to stick with metformin. Will f/u on A1c after Thursday's visit.  Patient Goals/Self-Care Activities Patient will:  - target a minimum of 150 minutes of moderate intensity exercise weekly         Medication Assistance: None required.  Patient affirms current coverage meets needs. [facility] [place] [address] [id number]: [phone]: [phone] Follow Up:  Patient agrees to Care Plan and Follow-up. Plan:  CPP telephone visit 6-8 months - DM/HTN/HLD at goal  Future Appointments  Date Time Provider [place] 12/27/2021 10:30 AM [facility]Leamon [facility]nt [facility] 10/11/2022 10:15 AM [facility] HEALTH COACH [facility] [place] PharmD Clinical Pharmacist  [facility]([phone]

## 2021-12-21 ENCOUNTER — Ambulatory Visit: Payer: Medicare HMO | Admitting: Family Medicine

## 2021-12-24 DIAGNOSIS — L570 Actinic keratosis: Secondary | ICD-10-CM | POA: Diagnosis not present

## 2021-12-24 DIAGNOSIS — T50905A Adverse effect of unspecified drugs, medicaments and biological substances, initial encounter: Secondary | ICD-10-CM | POA: Diagnosis not present

## 2021-12-24 DIAGNOSIS — Z23 Encounter for immunization: Secondary | ICD-10-CM | POA: Diagnosis not present

## 2021-12-25 ENCOUNTER — Ambulatory Visit (INDEPENDENT_AMBULATORY_CARE_PROVIDER_SITE_OTHER): Payer: Medicare HMO | Admitting: Pharmacist

## 2021-12-25 DIAGNOSIS — E118 Type 2 diabetes mellitus with unspecified complications: Secondary | ICD-10-CM

## 2021-12-25 DIAGNOSIS — E785 Hyperlipidemia, unspecified: Secondary | ICD-10-CM

## 2021-12-25 NOTE — Patient Instructions (Addendum)
Visit Information   Goals Addressed             This Visit's Progress    Monitor and Manage My Blood Sugar-Diabetes Type 2       Timeframe:  Long-Range Goal Priority:  High Start Date:   12/25/21                          Expected End Date:    06/24/22                   Follow Up Date 03/25/22    - check blood sugar if I feel it is too high or too low - enter blood sugar readings and medication or insulin into daily log - take the blood sugar log to all doctor visits    Why is this important?   Checking your blood sugar at home helps to keep it from getting very high or very low.  Writing the results in a diary or log helps the doctor know how to care for you.  Your blood sugar log should have the time, date and the results.  Also, write down the amount of insulin or other medicine that you take.  Other information, like what you ate, exercise done and how you were feeling, will also be helpful.     Notes:        Patient Care Plan: CCM Pharmacy Care Plan     Problem Identified: HTN, DM II, CAD - hx of MI, hypothyroidism, GERD - peptic ulcer, former smoker, OA   Priority: High     Long-Range Goal: Disease Management   Start Date: 04/23/2021  Expected End Date: 04/23/2022  Recent Progress: On track  Priority: High  Note:   Current Barriers:  Elevated fasting glucose  Pharmacist Clinical Goal(s):  Patient will contact provider office for questions/concerns as evidenced notation of same in electronic health record through collaboration with PharmD and provider.   Interventions: 1:1 collaboration with Leamon Arnt, MD regarding development and update of comprehensive plan of care as evidenced by provider attestation and co-signature Inter-disciplinary care team collaboration (see longitudinal plan of care) Comprehensive medication review performed; medication list updated in electronic medical record No Rx Changes  Hypertension (BP goal  <130/80) -Controlled -Secondary prevention  -Current treatment: Metoprolol tartrate 25 mg tablet - 1/2 tablet (12.5 mg twice daily) -Current home readings: never any high BP, consistently at goal  -Denies hypotensive/hypertensive symptoms -Educated on Exercise goal of 150 minutes per week; -Counseled to monitor BP at home at least once every 1-2 weeks, document, and provide log at future appointments -Counseled on diet and exercise extensively  Hyperlipidemia: (LDL goal < 70) -Controlled, near goal -Current treatment: Rosuvastatin 40 mg once daily Appropriate, Effective, Safe, Accessible -Medications previously tried: atorvastatin 80 mg, simvastatin 40 mg   -Reviewed side effects - no problems noted -Educated on Cholesterol goals;  -Recommended to continue current medication  Update 12/25/21 Continues to take daily. Most recent LDL is excellent Denies adverse effects. No need for changes at this time, continue routine screenings  Diabetes (A1c goal <7%) -Controlled, last a1c 5.8% when on both metformin and farxiga -Current medications: Metformin XR 500 mg twice daily Appropriate, Query effective  -Medications previously tried: farxiga 10 mg (recurrent balanitis - stopped 03/13/2021), metformin 1000 mg/day (diarrhea) -Current home glucose readings fasting glucose: 105-140 post prandial glucose: <200 -Denies hypoglycemic/hyperglycemic symptoms -Current meal patterns: occasional sweets, eggs for breakfast. Drinks: coffee,  coke zero, water. No alcohol.  -Current exercise: walking routinely, projects like sanding the car. Trying to get back to the [employer] -Educated on A1c and blood sugar goals; -Recommended to continue current medication. Pt preference to optimize DM with metformin + exercise, GLP could be considered due to CV benefit   -CPE already scheduled EOM  Update 12/25/21 140 was lowest it has been at home recently per patient. He mentions diet as follows: B: egg scramble  with peppers and cheese, occasional cereal or oatmeal L: D: eat out [place] burrito, refried beans Drinks: water, coke zero, black coffee Snack: ice cream sandwich occasionally Has upcoming visit with PCP. Reports he is currently taking metformin XR 500mg  BID. If A1c elevated would recommend maximizing metformin therapy and encouraging him to limit carbohydrate servings in meals, especially dinners. Could also consider GLP-1 for CV benefit, however his preference has always been to stick with metformin. Will f/u on A1c after Thursday's visit.  Patient Goals/Self-Care Activities Patient will:  - target a minimum of 150 minutes of moderate intensity exercise weekly         Patient verbalizes understanding of instructions and care plan provided today and agrees to view in [place]. Active MyChart status confirmed with patient.   Telephone follow up appointment with pharmacy team member scheduled for: 4 months  Edythe Clarity, [place], PharmD Clinical Pharmacist  [facility] [phone]

## 2021-12-27 ENCOUNTER — Other Ambulatory Visit: Payer: Self-pay | Admitting: Family Medicine

## 2021-12-27 ENCOUNTER — Ambulatory Visit (INDEPENDENT_AMBULATORY_CARE_PROVIDER_SITE_OTHER): Payer: Medicare HMO | Admitting: Family Medicine

## 2021-12-27 ENCOUNTER — Encounter: Payer: Self-pay | Admitting: Family Medicine

## 2021-12-27 ENCOUNTER — Other Ambulatory Visit: Payer: Self-pay

## 2021-12-27 VITALS — BP 122/72 | HR 65 | Temp 98.4°F | Ht 67.0 in | Wt 204.0 lb

## 2021-12-27 DIAGNOSIS — E1169 Type 2 diabetes mellitus with other specified complication: Secondary | ICD-10-CM

## 2021-12-27 DIAGNOSIS — E785 Hyperlipidemia, unspecified: Secondary | ICD-10-CM

## 2021-12-27 DIAGNOSIS — L570 Actinic keratosis: Secondary | ICD-10-CM

## 2021-12-27 DIAGNOSIS — I25118 Atherosclerotic heart disease of native coronary artery with other forms of angina pectoris: Secondary | ICD-10-CM | POA: Diagnosis not present

## 2021-12-27 DIAGNOSIS — E1159 Type 2 diabetes mellitus with other circulatory complications: Secondary | ICD-10-CM | POA: Diagnosis not present

## 2021-12-27 DIAGNOSIS — I152 Hypertension secondary to endocrine disorders: Secondary | ICD-10-CM | POA: Diagnosis not present

## 2021-12-27 LAB — POCT GLYCOSYLATED HEMOGLOBIN (HGB A1C): Hemoglobin A1C: 7.7 % — AB (ref 4.0–5.6)

## 2021-12-27 MED ORDER — METFORMIN HCL ER 500 MG PO TB24: 500.0000 mg | ORAL_TABLET | Freq: Two times a day (BID) | ORAL | 3 refills | Status: DC

## 2021-12-27 MED ORDER — GLIPIZIDE ER 5 MG PO TB24: 5.0000 mg | ORAL_TABLET | Freq: Every day | ORAL | 3 refills | Status: DC

## 2021-12-27 NOTE — Patient Instructions (Signed)
Please return in 3 months for diabetes follow up  Your physical with blood work will be due in June.   Please start the glucotrol XL 64m daily along with metformin twice a day to get your diabetes under control again.  We want your fasting sugars < 130 consistently.  Let me know if you experience any low blood sugars or symptoms of low blood sugars. See below for information on that.   If you have any questions or concerns, please don't hesitate to send me a message via MyChart or call the office at [phone] Thank you for visiting with [facility]! It's our pleasure caring for you.   Hypoglycemia Hypoglycemia occurs when the level of sugar (glucose) in the blood is too low. Hypoglycemia can happen in people who have or do not have diabetes. It can develop quickly, and it can be a medical emergency. For most people, a blood glucose level below 70 mg/dL (3.9 mmol/L) is considered hypoglycemia. Glucose is a type of sugar that provides the body's main source of energy. Certain hormones (insulin and glucagon) control the level of glucose in the blood. Insulin lowers blood glucose, and glucagon raises blood glucose. Hypoglycemia can result from having too much insulin in the bloodstream, or from not eating enough food that contains glucose. You may also have reactive hypoglycemia, which happens within 4 hours after eating a meal. What are the causes? Hypoglycemia occurs most often in people who have diabetes and may be caused by: Diabetes medicine. Not eating enough, or not eating often enough. Increased physical activity. Drinking alcohol on an empty stomach. If you do not have diabetes, hypoglycemia may be caused by: A tumor in the pancreas. Not eating enough, or not eating for long periods at a time (fasting). A severe infection or illness. Problems after having bariatric surgery. Organ failure, such as kidney or liver failure. Certain medicines. What increases the risk? Hypoglycemia is more  likely to develop in people who: Have diabetes and take medicines to lower blood glucose. Abuse alcohol. Have a severe illness. What are the signs or symptoms? Symptoms vary depending on whether the condition is mild, moderate, or severe. Mild hypoglycemia Hunger. Sweating and feeling clammy. Dizziness or feeling light-headed. Sleepiness or restless sleep. Nausea. Increased heart rate. Headache. Blurry vision. Mood changes, such as irritability or anxiety. Tingling or numbness around the mouth, lips, or tongue. Moderate hypoglycemia Confusion and poor judgment. Behavior changes. Weakness. Irregular heartbeat. A change in coordination. Severe hypoglycemia Severe hypoglycemia is a medical emergency. It can cause: Fainting. Seizures. Loss of consciousness (coma). Death. How is this diagnosed? Hypoglycemia is diagnosed with a blood test to measure your blood glucose level. This blood test is done while you are having symptoms. Your health care provider may also do a physical exam and review your medical history. How is this treated? This condition can be treated by immediately eating or drinking something that contains sugar with 15 grams of fast-acting carbohydrate, such as: 4 oz (120 mL) of fruit juice. 4 oz (120 mL) of regular soda (not diet soda). Several pieces of hard candy. Check food labels to find out how many pieces to eat for 15 grams. 1 Tbsp (15 mL) of sugar or honey. 4 glucose tablets. 1 tube of glucose gel. Treating hypoglycemia if you have diabetes If you are alert and able to swallow safely, follow the 15:15 rule: Take 15 grams of a fast-acting carbohydrate. Talk with your health care provider about how much you should take.  Options for getting 15 grams of fast-acting carbohydrate include: Glucose tablets (take 4 tablets). Several pieces of hard candy. Check food labels to find out how many pieces to eat for 15 grams. 4 oz (120 mL) of fruit juice. 4 oz (120  mL) of regular soda (not diet soda). 1 Tbsp (15 mL) of sugar or honey. 1 tube of glucose gel. Check your blood glucose 15 minutes after you take the carbohydrate. If the repeat blood glucose level is still at or below 70 mg/dL (3.9 mmol/L), take 15 grams of a carbohydrate again. If your blood glucose level does not increase above 70 mg/dL (3.9 mmol/L) after 3 tries, seek emergency medical care. After your blood glucose level returns to normal, eat a meal or a snack within 1 hour.  Treating severe hypoglycemia Severe hypoglycemia is when your blood glucose level is below 54 mg/dL (3 mmol/L). Severe hypoglycemia is a medical emergency. Get medical help right away. If you have severe hypoglycemia and you cannot eat or drink, you will need to be given glucagon. A family member or close friend should learn how to check your blood glucose and how to give you glucagon. Ask your health care provider if you need to have an emergency glucagon kit available. Severe hypoglycemia may need to be treated in a hospital. The treatment may include getting glucose through an IV. You may also need treatment for the cause of your hypoglycemia. Follow these instructions at home: General instructions Take over-the-counter and prescription medicines only as told by your health care provider. Monitor your blood glucose as told by your health care provider. If you drink alcohol: Limit how much you have to: 0-1 drink a day for women who are not pregnant. 0-2 drinks a day for men. Know how much alcohol is in your drink. In the [place] one drink equals one 12 oz bottle of beer (355 mL), one 5 oz glass of wine (148 mL), or one 1 oz glass of hard liquor (44 mL). Be sure to eat food along with drinking alcohol. Be aware that alcohol is absorbed quickly and may have lingering effects that may result in hypoglycemia later. Be sure to do ongoing glucose monitoring. Keep all follow-up visits. This is important. If you have  diabetes: Always have a fast-acting carbohydrate (15 grams) option with you to treat low blood glucose. Follow your diabetes management plan as directed by your health care provider. Make sure you: Know the symptoms of hypoglycemia. It is important to treat it right away to prevent it from becoming severe. Check your blood glucose as often as told. Always check before and after exercise. Always check your blood glucose before you drive a motorized vehicle. Take your medicines as told. Follow your meal plan. Eat on time, and do not skip meals. Share your diabetes management plan with people in your workplace, school, and household. Carry a medical alert card or wear medical alert jewelry. Where to find more information American Diabetes Association: www.diabetes.org Contact a health care provider if: You have problems keeping your blood glucose in your target range. You have frequent episodes of hypoglycemia. Get help right away if: You continue to have hypoglycemia symptoms after eating or drinking something that contains 15 grams of fast-acting carbohydrate, and you cannot get your blood glucose above 70 mg/dL (3.9 mmol/L) while following the 15:15 rule. Your blood glucose is below 54 mg/dL (3 mmol/L). You have a seizure. You faint. These symptoms may represent a serious problem that is an  emergency. Do not wait to see if the symptoms will go away. Get medical help right away. Call your local emergency services (911 in the [place] Do not drive yourself to the hospital. Summary Hypoglycemia occurs when the level of sugar (glucose) in the blood is too low. Hypoglycemia can happen in people who have or do not have diabetes. It can develop quickly, and it can be a medical emergency. Make sure you know the symptoms of hypoglycemia and how to treat it. Always have a fast-acting carbohydrate option with you to treat low blood sugar. This information is not intended to replace advice given to you  by your health care provider. Make sure you discuss any questions you have with your health care provider. Document Revised: 10/19/2020 Document Reviewed: 10/19/2020 Elsevier Patient Education  2022 [employer].

## 2021-12-27 NOTE — Progress Notes (Signed)
Subjective  CC:  Chief Complaint  Patient presents with   Diabetes    Pt states his blood sugars at home been around 140.    Hypertension   Chest Pain    HPI: Adrian Gamble is a 76 y.o. male who presents to the office today for follow up of diabetes and problems listed above in the chief complaint.  Diabetes follow up: His diabetic control is reported as Worse.  His diet is unchanged.  Although his weight is up.  Activity level is down.  Fasting sugars running around 140s.  We did increase metformin ER to 500 twice daily.  He is tolerating this well fortunately.  However fasting sugars remain elevated.  He did have COVID over the Thanksgiving holiday.  He feels fully recovered from that and had mild symptoms.  No other infections, or steroids.  Continues to have some stable angina but doing much better on Imdur.  Denies dysuria or nocturia.  No foot sores.  He did have a diabetic eye exam in December which was normal. CAD: Remains stable on Imdur and beta-blocker. Hypertension: Well-controlled.  No edema. Of note, recently treated by dermatology with fluorouracil for multiple precancerous lesions on his face.  Face remains red and dry but is much improved from last month.  Wt Readings from Last 3 Encounters:  12/27/21 204 lb (92.5 kg)  09/13/21 195 lb 3.2 oz (88.5 kg)  09/04/21 199 lb (90.3 kg)    BP Readings from Last 3 Encounters:  12/27/21 122/72  09/13/21 128/88  09/04/21 108/62    Assessment  1. Type 2 diabetes mellitus with vascular disease ([place])   2. 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 Hypertension associated with diabetes ([place])   4. Actinic keratoses      Plan  Diabetes is currently marginally controlled.  Worsening diabetic control for unclear reasons.  Possibly related to recent COVID infection and angina.  We will continue metformin ER twice daily 500 and add Glucotrol XL 5 mg daily.  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  At this time he defers GLP-1 because they are injectables.  Recheck 3 months.  Discussed possibility of hypoglycemia, symptoms and risks.  Patient will monitor fasting sugars. Continue heart medicines.  Angina is stable Hypertension is well controlled.  Follow up: 3 months to recheck diabetes. Orders Placed This Encounter  Procedures   POCT HgB A1C   Meds ordered this encounter  Medications   metFORMIN (GLUCOPHAGE-XR) 500 MG 24 hr tablet    Sig: Take 1 tablet (500 mg total) by mouth 2 (two) times daily.    Dispense:  180 tablet    Refill:  3   glipiZIDE (GLUCOTROL XL) 5 MG 24 hr tablet    Sig: Take 1 tablet (5 mg total) by mouth daily with breakfast.    Dispense:  90 tablet    Refill:  3      Immunization History  Administered Date(s) Administered   Fluad Quad(high Dose 65+) 09/16/2019, 09/05/2020, 09/13/2021   Influenza Split 11/01/2011, 09/22/2012   Influenza Whole 09/22/2009, 09/20/2010   Influenza, High Dose Seasonal PF 10/03/2015, 11/11/2016, 10/13/2017, 11/20/2018   Influenza,inj,Quad PF,6+ Mos 09/16/2013, 09/29/2014   PFIZER(Purple Top)SARS-COV-2 Vaccination 02/15/2020, 03/07/2020, 10/20/2020   Pneumococcal Conjugate-13 02/25/2014   Pneumococcal Polysaccharide-23 09/01/2007, 07/31/2011   Td 09/01/2007   Zoster Recombinat (Shingrix) 08/10/2020, 10/10/2020    Diabetes Related Lab Review: Lab Results  Component Value Date   HGBA1C 7.7 (A) 12/27/2021   HGBA1C 6.4 (A) 09/13/2021   HGBA1C 6.3 (A) 06/13/2021    Lab Results  Component Value Date   MICROALBUR 2.1 (H) 09/13/2021   Lab Results  Component Value Date   CREATININE 1.07 09/13/2021   BUN 14 09/13/2021   NA 138 09/13/2021   K 4.1 09/13/2021   CL 104 09/13/2021   CO2 28 09/13/2021   Lab Results  Component Value Date   CHOL 103 09/13/2021   CHOL 122 07/24/2021   CHOL 145 04/27/2021   Lab Results  Component Value Date   HDL 40.00 09/13/2021   HDL  51 07/24/2021   HDL 42 04/27/2021   Lab Results  Component Value Date   LDLCALC 43 09/13/2021   LDLCALC 53 07/24/2021   LDLCALC 84 04/27/2021   Lab Results  Component Value Date   TRIG 102.0 09/13/2021   TRIG 94 07/24/2021   TRIG 101 04/27/2021   Lab Results  Component Value Date   CHOLHDL 3 09/13/2021   CHOLHDL 2.4 07/24/2021   CHOLHDL 3.5 04/27/2021   No results found for: LDLDIRECT The ASCVD Risk score (Arnett DK, et al., 2019) failed to calculate for the following reasons:   The patient has a prior MI or stroke diagnosis I have reviewed the [facility] history. 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     Hypertension associated with diabetes ([place]) 10/05/2018    Priority: High   Type 2 diabetes mellitus with vascular disease ([place]) 08/12/2016    Priority: High    Low dose metformin due to GI side effects    Hypothyroidism (acquired) 05/29/2009    Priority: High   Hyperlipidemia associated with type 2 diabetes mellitus ([place]) 09/01/2007    Priority: High   Coronary artery disease of native artery of native heart with stable angina pectoris ([place])     Priority: High   Bruit 05/17/2021    Priority: Medium     Carotid dopplers 06/2021; bilateral minimal stenosis 1-39%    Bilateral primary osteoarthritis of knee 04/27/2020    Priority: Medium     xrays 04/2020, tricompartmental, worse medially    Diverticulosis 07/13/2019    Priority: Medium    GERD 09/01/2007    Priority: Medium    Peptic ulcer 09/01/2007    Priority: Medium    Former smoker 07/13/2019    Priority: Low   Testosterone deficiency 09/01/2007    Priority: Low   Unstable angina ([place]) 05/03/2021   Brachial neuritis  or radiculitis, right 02/20/2009    Social History: Patient  reports that he quit smoking about 9 years ago. His smoking use included pipe and cigars. He has never used smokeless tobacco. He reports current alcohol use. He reports that he does not use drugs.  Review of Systems: Ophthalmic: negative for eye pain, loss of vision or double vision Cardiovascular: negative for chest pain Respiratory: negative for SOB or persistent cough Gastrointestinal: negative for abdominal pain Genitourinary: negative for dysuria or gross hematuria MSK: negative for foot lesions Neurologic: negative for weakness or gait disturbance  Objective  Vitals:  BP 122/72    Pulse 65    Temp 98.4 F (36.9 C) (Temporal)    Ht 5\' 7"  (1.702 m)    Wt 204 lb (92.5 kg)    SpO2 96%    BMI 31.95 kg/m  General: well appearing, no acute distress  Psych:  Alert and oriented, normal mood and affect HEENT:  Normocephalic, atraumatic, moist mucous membranes, supple neck  Cardiovascular:  Nl S1 and S2, RRR without murmur, gallop or rub. no edema Respiratory:  Good breath sounds bilaterally, CTAB with normal effort, no rales  Diabetic education: ongoing education regarding chronic disease management for diabetes was given today. We continue to reinforce the ABC's of diabetic management: A1c (<7 or 8 dependent upon patient), tight blood pressure control, and cholesterol management with goal LDL < 100 minimally. We discuss diet strategies, exercise recommendations, medication options and possible side effects. At each visit, we review recommended immunizations and preventive care recommendations for diabetics and stress that good diabetic control can prevent other problems. See below for this patient's data.   Commons side effects, risks, benefits, and alternatives for medications and treatment plan prescribed today were discussed, and the patient expressed understanding of the given instructions. Patient is instructed to call or  message via MyChart if he/she has any questions or concerns regarding our treatment plan. No barriers to understanding were identified. We discussed Red Flag symptoms and signs in detail. Patient expressed understanding regarding what to do in case of urgent or emergency type symptoms.  Medication list was reconciled, printed and provided to the patient in AVS. Patient instructions and summary information was reviewed with the patient as documented in the AVS. This note was prepared with assistance of Dragon voice recognition software. Occasional wrong-word or sound-a-like substitutions may have occurred due to the inherent limitations of voice recognition software  This visit occurred during the SARS-CoV-2 public health emergency.  Safety protocols were in place, including screening questions prior to the visit, additional usage of staff PPE, and extensive cleaning of exam room while observing appropriate contact time as indicated for disinfecting solutions.

## 2022-01-01 DIAGNOSIS — I10 Essential (primary) hypertension: Secondary | ICD-10-CM

## 2022-01-01 DIAGNOSIS — E1169 Type 2 diabetes mellitus with other specified complication: Secondary | ICD-10-CM

## 2022-01-01 DIAGNOSIS — E785 Hyperlipidemia, unspecified: Secondary | ICD-10-CM | POA: Diagnosis not present

## 2022-01-01 DIAGNOSIS — Z7984 Long term (current) use of oral hypoglycemic drugs: Secondary | ICD-10-CM | POA: Diagnosis not present

## 2022-01-16 ENCOUNTER — Other Ambulatory Visit: Payer: Self-pay | Admitting: Family Medicine

## 2022-01-18 ENCOUNTER — Telehealth: Payer: Self-pay | Admitting: Family Medicine

## 2022-01-18 ENCOUNTER — Other Ambulatory Visit: Payer: Self-pay

## 2022-01-18 NOTE — Telephone Encounter (Signed)
Spoke with patient.

## 2022-01-18 NOTE — Telephone Encounter (Signed)
Patient was triaged a second time at 4:09pm on 2/17/23Stanton Kidney (pt coordinator) stated that a nurse will be calling the patient. Stanton Kidney did not speak to the patient she instead asked me to transmit information to patient

## 2022-01-18 NOTE — Telephone Encounter (Signed)
Pt needs a callback asap. The medication Glipezide he has been taking for the last three weeks is bring his Blood Sugar too low.

## 2022-01-21 ENCOUNTER — Telehealth: Payer: Self-pay | Admitting: Family Medicine

## 2022-01-21 ENCOUNTER — Ambulatory Visit (INDEPENDENT_AMBULATORY_CARE_PROVIDER_SITE_OTHER): Payer: Medicare HMO | Admitting: Family Medicine

## 2022-01-21 ENCOUNTER — Other Ambulatory Visit: Payer: Self-pay

## 2022-01-21 ENCOUNTER — Encounter: Payer: Self-pay | Admitting: Family Medicine

## 2022-01-21 VITALS — BP 110/60 | HR 78 | Temp 98.4°F | Ht 67.0 in | Wt 202.4 lb

## 2022-01-21 DIAGNOSIS — E1159 Type 2 diabetes mellitus with other circulatory complications: Secondary | ICD-10-CM | POA: Diagnosis not present

## 2022-01-21 DIAGNOSIS — E11649 Type 2 diabetes mellitus with hypoglycemia without coma: Secondary | ICD-10-CM

## 2022-01-21 MED ORDER — GLIPIZIDE ER 2.5 MG PO TB24: 2.5000 mg | ORAL_TABLET | Freq: Every day | ORAL | 0 refills | Status: DC

## 2022-01-21 NOTE — Telephone Encounter (Signed)
Patient has OV today

## 2022-01-21 NOTE — Patient Instructions (Signed)
It was very nice to see you today!  Hold glipizide this week as traveling.  Can do the 2.5mg  on days not driving for now.  Or not take at all this week and behave on the diet.     PLEASE NOTE:  If you had any lab tests please let us know if you have not heard back within a few days. You may see your results on MyChart before we have a chance to review them but we will give you a call once they are reviewed by [place]. If we ordered any referrals today, please let us know if you have not heard from their office within the next week.   Please try these tips to maintain a healthy lifestyle:  Eat most of your calories during the day when you are active. Eliminate processed foods including packaged sweets (pies, cakes, cookies), reduce intake of potatoes, white bread, white pasta, and white rice. Look for whole grain options, oat flour or almond flour.  Each meal should contain half fruits/vegetables, one quarter protein, and one quarter carbs (no bigger than a computer mouse).  Cut down on sweet beverages. This includes juice, soda, and sweet tea. Also watch fruit intake, though this is a healthier sweet option, it still contains natural sugar! Limit to 3 servings daily.  Drink at least 1 glass of water with each meal and aim for at least 8 glasses per day  Exercise at least 150 minutes every week.

## 2022-01-21 NOTE — Telephone Encounter (Signed)
Patient seen on 01/21/2022, discussed with Dr. Cherlynn Kaiser.

## 2022-01-21 NOTE — Progress Notes (Signed)
Subjective:     Patient ID: Adrian Gamble, male    DOB: [birth year], 76 y.o.   MRN: [id number]  Chief Complaint  Patient presents with   Medication Problem    Started glipizide and it caused low blood sugar. Had an episode on Thursday, low blood sugar with dizziness. Last dose was Saturday morning    HPI  Type 2 DM w/CAD-started on Glipizide recently.  Having low sugars.  Getting symptoms-Thurs, went low twice.  Stopped glipizide-on Sat. On 2/7, 66 at 3:45.  On 2/11, went to 64 at 3:40pm. On 2/16 58 at 4:15.  On 2/18 58 at 4pm.  Other sugars 107-130.  Before glipizide, 3-4pm-lowest would be 135 or more. Usually eats light lunch. Sometimes, exercises after lunch.   Pt will be doing a lot of driving and very concerned.   Yesterday 4pm usgar 168-no glipizide. 1/26 A1C 7.7  Health Maintenance Due  Topic Date Due   COVID-19 Vaccine (4 - Booster for [employer] series) 12/15/2020    Past Medical History:  Diagnosis Date   Bilateral primary osteoarthritis of knee 04/27/2020   xrays 04/2020, tricompartmental, worse medially   CAD (coronary artery disease), s/p MI - s/p PCI to RCA 2001;  [place] 7/13: 3v CAD, good LVF - >  s/p CABG 7/13 (L-LAD, S-OM1/dCFX, S-dRCA)    Diabetes mellitus type 2, controlled ([place])    Diverticulosis    Early cataracts, bilateral    GERD 09/01/2007   H/O hiatal hernia    HLD (hyperlipidemia), on statin    Hypothyroidism. 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   REM disorder   Testosterone deficiency     Past Surgical History:  Procedure Laterality Date   arm surgery  1957   d/t broken   CARDIAC CATHETERIZATION  2005; 06/01/2012   COLONOSCOPY  2005   CORONARY ANGIOPLASTY WITH STENT PLACEMENT  2001   RCA-1 stent   CORONARY ARTERY BYPASS GRAFT  06/09/2012   Procedure: CORONARY ARTERY BYPASS GRAFTING (CABG);  Surgeon: Grace Isaac, MD;  Location: [facility];  Service: Open Heart Surgery;  Laterality: N/A;  CABG x four;  using left internal mammary artery and right leg greater saphenous vein harvested endoscopically   ESOPHAGOGASTRODUODENOSCOPY     LEFT HEART CATH AND CORONARY ANGIOGRAPHY N/A 05/03/2021   Procedure: LEFT HEART CATH AND CORONARY ANGIOGRAPHY;  Surgeon: Martinique, Peter M, MD;  Location: [facility];  Service: Cardiovascular;  Laterality: N/A;   PERCUTANEOUS CORONARY STENT INTERVENTION (PCI-S) N/A 06/01/2012   Procedure: PERCUTANEOUS CORONARY STENT INTERVENTION (PCI-S);  Surgeon: Burnell Blanks, MD;  Location: [facility] C[facility];  Service: Cardiovascular;  Laterality: N/A;   [place]   at age 25    Outpatient Medications Prior to Visit  Medication Sig Dispense Refill   aspirin EC 81 MG tablet Take 81 mg by mouth daily.     Blood Glucose Monitoring Suppl (ONETOUCH VERIO) w/Device KIT Use kit as directed. 1 kit 0   ezetimibe (ZETIA) 10 MG tablet Take 10 mg by mouth daily.     glucose blood test strip Use as instructed 100 each 12   isosorbide mononitrate (IMDUR) 120 MG 24 hr tablet Take 1 tablet (120 mg total) by mouth daily. 90 tablet 3   levothyroxine (SYNTHROID) 112 MCG tablet Take 1 tablet by  mouth once daily 90 tablet 0   Melatonin ER 5 MG TBCR Take 5 mg by mouth at bedtime.     metFORMIN (GLUCOPHAGE-XR) 500 MG 24 hr tablet Take 1 tablet (500 mg total) by mouth 2 (two) times daily. 180 tablet 3   metoprolol tartrate (LOPRESSOR) 25 MG tablet Take 1/2 (one-half) tablet by mouth twice daily 90 tablet 0   OneTouch Delica Lancets 67E MISC Check blood sugar up to 3 times daily. 90 each 1   ONETOUCH VERIO test strip USE 1 STRIP TO CHECK GLUCOSE IN THE MORNING, AT NOON AND AT BEDTIME UP TO 3 TIMES DAILY 100 each 0   rosuvastatin (CRESTOR) 40 MG tablet Take 1 tablet (40 mg total) by mouth at bedtime. 90 tablet 3   Scar Treatment Products (MEDERMA) GEL Apply 1 application topically 3 (three) times a week.      vitamin B-12 (CYANOCOBALAMIN) 100 MCG tablet Take 100 mcg by mouth daily.     nitroGLYCERIN (NITROSTAT) 0.4 MG SL tablet Place 1 tablet (0.4 mg total) under the tongue every 5 (five) minutes as needed. 25 tablet 3   glipiZIDE (GLUCOTROL XL) 5 MG 24 hr tablet Take 1 tablet (5 mg total) by mouth daily with breakfast. 90 tablet 3   No facility-administered medications prior to visit.    Allergies  Allergen Reactions   [name] [Dapagliflozin] Other (See Comments)    Recurrent balanitis; severe   Oxycontin [Oxycodone] Nausea Only    Any Contin   ROS : angina 90% better on Imdiur      Objective:     BP 110/60    Pulse 78    Temp 98.4 F (36.9 C) (Temporal)    Ht _0  (1.702 m)    Wt 202 lb 6 oz (91.8 kg)    SpO2 95%    BMI 31.70 kg/m  Wt Readings from Last 3 Encounters:  01/21/22 202 lb 6 oz (91.8 kg)  12/27/21 204 lb (92.5 kg)  09/13/21 195 lb 3.2 oz (88.5 kg)        Gen: WDWN NAD OWM HEENT: NCAT, conjunctiva not injected, sclera nonicteric NECK:  supple, no thyromegaly, no nodes, no carotid bruits CARDIAC: RRR, S1S2+,1/6 murmur. MSK: no gross abnormalities.  NEURO: A&O x3.  CN II-XII intact.  PSYCH: normal mood. Good eye contact  Assessment & Plan:   Problem List Items Addressed This Visit       Cardiovascular and Mediastinum   Type 2 diabetes mellitus with vascular disease ([place]) - Primary   Relevant Medications   glipiZIDE (GLUCOTROL XL) 2.5 MG 24 hr tablet   Other Visit Diagnoses     Hypoglycemia due to type 2 diabetes mellitus (HCC)       Relevant Medications   glipiZIDE (GLUCOTROL XL) 2.5 MG 24 hr tablet      DM type 2 w/vasc dz-was uncontrolled so added glipizide, now getting a lot of lows.  Pt fearful of driving 720NOBSJ and drop-ok to hold this wk when driving.  Work on diet.  On days not traveling, can take 2.65m or just wait till next wk and start 2.552mglipizide.  Educated on tx, why drop and dietary changes.  He will let usKoreanow if problems  continue. 3061mtes spent w/pt  Meds ordered this encounter  Medications   glipiZIDE (GLUCOTROL XL) 2.5 MG 24 hr tablet    Sig: Take 1 tablet (2.5 mg total) by mouth daily with breakfast.    Dispense:  90 tablet  Refill:  0    Pt would like to pick up around 9am    Wellington Hampshire, MD

## 2022-01-21 NOTE — Telephone Encounter (Signed)
NOTE: PT COMPLETED VISIT WITH DR Cherlynn Kaiser ON 01/21/22 at [facility]   Patient Name: Adrian Gamble Gender: Male DOB: Sep 12, 1946 Age: 76 Y 74 M 11 D Return Phone Number: [phone] (Primary), [phone] (Secondary) Address: City/ State/ Zip: [place]  [address] Client [place] [facility] [place] [facility] [place] [facility] Provider Billey Chang- MD Contact Type Call Who Is Calling Patient / Member / Family / Caregiver Call Type Triage / Clinical Relationship To Patient Self Return Phone Number [phone] (Primary) Chief Complaint Blood Sugar Low Reason for Call Symptomatic / Request for [place] states his blood sugar is really low. Translation No Nurse Assessment Nurse: Redmond Pulling, RN, Levada Dy Date/Time Eilene Ghazi Time): 01/18/2022 4:17:24 PM Confirm and document reason for call. If symptomatic, describe symptoms. ---Caller states his blood sugar is low, has been on glipizide for 3 weeks. Became sweaty and disoriented yesterday. Had candy and it went up. Patient is also taking metformin. Does the patient have any new or worsening symptoms? ---Yes Will a triage be completed? ---Yes Related visit to physician within the last 2 weeks? ---No Does the PT have any chronic conditions? (i.e. diabetes, asthma, this includes High risk factors for pregnancy, etc.) ---Yes List chronic conditions. ---Diabetes, hx of CABG Is this a behavioral health or substance abuse call? ---No Guidelines Guideline Title Affirmed Question Affirmed Notes Nurse Date/Time (Eastern Time) Diabetes - Low Blood Sugar [1] Blood glucose < 70 mg/dL (3.9 mmol/ L) or symptomatic, now improved with Care Advice AND [2] cause unknown Jannet Askew 01/18/2022 4:19:46 PM PLEASE NOTE: All timestamps contained within this report are represented as [place] Standard Time. CONFIDENTIALTY NOTICE: This fax transmission is intended  only for the addressee. It contains information that is legally privileged, confidential or otherwise protected from use or disclosure. If you are not the intended recipient, you are strictly prohibited from reviewing, disclosing, copying using or disseminating any of this information or taking any action in reliance on or regarding this information. If you have received this fax in error, please notify us immediately by telephone so that we can arrange for its return to [place]. Phone: [phone], Toll-Free: [phone], Fax: [phone] Page: 2 of 2 Call Id: [id number] [place]. Time Eilene Ghazi Time) Disposition Final User 01/18/2022 4:07:25 PM Attempt made - no message left Jannet Askew 01/18/2022 4:08:18 PM Attempt made - no message left Jannet Askew 01/18/2022 4:27:17 PM Call PCP within 24 Hours Yes Redmond Pulling, RN, Marin Shutter Disagree/Comply Comply Caller Understands Yes PreDisposition InappropriateToAsk Care Advice Given Per Guideline CALL PCP WITHIN 24 HOURS: * You need to discuss this with your doctor (or NP/PA) within the next 24 hours. LOW BLOOD SUGAR (HYPOGLYCEMIA) - DEFINITION: * Low blood sugar (hypoglycemia) is defined as a blood glucose less than 70 mg/ dL (3.9 mmol/L). * Symptoms of mild hypoglycemia: Shakiness, weakness, not thinking clearly, headache, trembling, sweating, dizziness, palpitations, and hunger. LOW BLOOD SUGAR - TREATMENT - EAT SOME (15-20 GRAMS) SUGAR NOW: * Each of the following has the right amount of sugar. * Milk (1 cup; 240 ml) * GLUCOSE TABLETS (3 to 4 tablets); preferred, if available * Skittles candy (15) * Table sugar or honey (3 teaspoons; 15 ml) LOW BLOOD SUGAR - EXPECTED COURSE: * The low blood sugar symptoms should start getting better in 5 to 10 minutes. * After the symptoms resolve, eat a small snack to prevent this from recurring. Examples include: cheese and crackers, a glass  of milk, or half a sandwich. MEASURE AND RECORD YOUR BLOOD GLUCOSE: *  Measure your blood glucose before breakfast and before going to bed. * Keep a log and show it to your doctor (or NP/ PA) at your next office visit. CALL BACK IF: * Symptoms do not improve within 30 minutes * Sleepiness or confusion occur * You become worse CARE ADVICE given per Diabetes - Low Blood Sugar (Adult) guideline. Referrals REFERRED TO PCP OFFIC

## 2022-02-04 ENCOUNTER — Other Ambulatory Visit: Payer: Self-pay | Admitting: Family Medicine

## 2022-02-04 DIAGNOSIS — E118 Type 2 diabetes mellitus with unspecified complications: Secondary | ICD-10-CM

## 2022-02-13 DIAGNOSIS — T50905A Adverse effect of unspecified drugs, medicaments and biological substances, initial encounter: Secondary | ICD-10-CM | POA: Diagnosis not present

## 2022-02-13 DIAGNOSIS — L578 Other skin changes due to chronic exposure to nonionizing radiation: Secondary | ICD-10-CM | POA: Diagnosis not present

## 2022-02-13 DIAGNOSIS — D229 Melanocytic nevi, unspecified: Secondary | ICD-10-CM | POA: Diagnosis not present

## 2022-02-13 DIAGNOSIS — L570 Actinic keratosis: Secondary | ICD-10-CM | POA: Diagnosis not present

## 2022-02-13 DIAGNOSIS — D1801 Hemangioma of skin and subcutaneous tissue: Secondary | ICD-10-CM | POA: Diagnosis not present

## 2022-02-13 DIAGNOSIS — L821 Other seborrheic keratosis: Secondary | ICD-10-CM | POA: Diagnosis not present

## 2022-03-13 ENCOUNTER — Other Ambulatory Visit: Payer: Self-pay | Admitting: Family Medicine

## 2022-03-13 DIAGNOSIS — E118 Type 2 diabetes mellitus with unspecified complications: Secondary | ICD-10-CM

## 2022-04-01 ENCOUNTER — Ambulatory Visit (INDEPENDENT_AMBULATORY_CARE_PROVIDER_SITE_OTHER): Payer: Medicare HMO | Admitting: Family Medicine

## 2022-04-01 ENCOUNTER — Encounter: Payer: Self-pay | Admitting: Family Medicine

## 2022-04-01 VITALS — BP 118/70 | HR 63 | Temp 98.1°F | Ht 67.0 in | Wt 202.1 lb

## 2022-04-01 DIAGNOSIS — E785 Hyperlipidemia, unspecified: Secondary | ICD-10-CM

## 2022-04-01 DIAGNOSIS — E1169 Type 2 diabetes mellitus with other specified complication: Secondary | ICD-10-CM | POA: Diagnosis not present

## 2022-04-01 DIAGNOSIS — I200 Unstable angina: Secondary | ICD-10-CM

## 2022-04-01 DIAGNOSIS — M170 Bilateral primary osteoarthritis of knee: Secondary | ICD-10-CM

## 2022-04-01 DIAGNOSIS — I152 Hypertension secondary to endocrine disorders: Secondary | ICD-10-CM | POA: Diagnosis not present

## 2022-04-01 DIAGNOSIS — E1159 Type 2 diabetes mellitus with other circulatory complications: Secondary | ICD-10-CM

## 2022-04-01 DIAGNOSIS — I25118 Atherosclerotic heart disease of native coronary artery with other forms of angina pectoris: Secondary | ICD-10-CM

## 2022-04-01 DIAGNOSIS — E039 Hypothyroidism, unspecified: Secondary | ICD-10-CM

## 2022-04-01 LAB — LIPID PANEL
Cholesterol: 95 mg/dL (ref 0–200)
HDL: 37.7 mg/dL — ABNORMAL LOW (ref >39.00)
LDL Cholesterol: 38 mg/dL (ref 0–99)
NonHDL: 57.69
Total CHOL/HDL Ratio: 3
Triglycerides: 99 mg/dL (ref 0.0–149.0)
VLDL: 19.8 mg/dL (ref 0.0–40.0)

## 2022-04-01 LAB — COMPREHENSIVE METABOLIC PANEL
ALT: 14 U/L (ref 0–53)
AST: 18 U/L (ref 0–37)
Albumin: 4.4 g/dL (ref 3.5–5.2)
Alkaline Phosphatase: 39 U/L (ref 39–117)
BUN: 16 mg/dL (ref 6–23)
CO2: 24 mEq/L (ref 19–32)
Calcium: 9.7 mg/dL (ref 8.4–10.5)
Chloride: 104 mEq/L (ref 96–112)
Creatinine, Ser: 1.09 mg/dL (ref 0.40–1.50)
GFR: 65.44 mL/min (ref >60.00)
Glucose, Bld: 111 mg/dL — ABNORMAL HIGH (ref 70–99)
Potassium: 4.2 mEq/L (ref 3.5–5.1)
Sodium: 139 mEq/L (ref 135–145)
Total Bilirubin: 1 mg/dL (ref 0.2–1.2)
Total Protein: 7.2 g/dL (ref 6.0–8.3)

## 2022-04-01 LAB — POCT GLYCOSYLATED HEMOGLOBIN (HGB A1C): Hemoglobin A1C: 5.8 % — AB (ref 4.0–5.6)

## 2022-04-01 LAB — CBC WITH DIFFERENTIAL/PLATELET
Basophils Absolute: 0 10*3/uL (ref 0.0–0.1)
Basophils Relative: 0.6 % (ref 0.0–3.0)
Eosinophils Absolute: 0.1 10*3/uL (ref 0.0–0.7)
Eosinophils Relative: 2 % (ref 0.0–5.0)
HCT: 42.6 % (ref 39.0–52.0)
Hemoglobin: 14.3 g/dL (ref 13.0–17.0)
Lymphocytes Relative: 25.2 % (ref 12.0–46.0)
Lymphs Abs: 1.4 10*3/uL (ref 0.7–4.0)
MCHC: 33.7 g/dL (ref 30.0–36.0)
MCV: 90.5 fl (ref 78.0–100.0)
Monocytes Absolute: 0.5 10*3/uL (ref 0.1–1.0)
Monocytes Relative: 9.6 % (ref 3.0–12.0)
Neutro Abs: 3.5 10*3/uL (ref 1.4–7.7)
Neutrophils Relative %: 62.6 % (ref 43.0–77.0)
Platelets: 140 10*3/uL — ABNORMAL LOW (ref 150.0–400.0)
RBC: 4.7 Mil/uL (ref 4.22–5.81)
RDW: 13.3 % (ref 11.5–15.5)
WBC: 5.6 10*3/uL (ref 4.0–10.5)

## 2022-04-01 LAB — TSH: TSH: 0.73 u[IU]/mL (ref 0.35–5.50)

## 2022-04-01 MED ORDER — GLIPIZIDE ER 2.5 MG PO TB24: 2.5000 mg | ORAL_TABLET | Freq: Every day | ORAL | 3 refills | Status: DC

## 2022-04-01 MED ORDER — LEVOTHYROXINE SODIUM 112 MCG PO TABS: 112.0000 ug | ORAL_TABLET | Freq: Every day | ORAL | 3 refills | Status: DC

## 2022-04-01 NOTE — Addendum Note (Signed)
Addended by: Billey Chang on: 04/01/2022 02:59 PM ? ? Modules accepted: Orders ? ?

## 2022-04-01 NOTE — Progress Notes (Signed)
? ?Subjective  ?CC:  ?Chief Complaint  ?Patient presents with  ? Diabetes  ?  Pt here to F/U with Diabetes.  ? ? ?HPI: Adrian Gamble is a 76 y.o. male who presents to the office today for follow up of diabetes and problems listed above in the chief complaint.  ?Diabetes follow up: Now on Glucotrol XL 2.5 mg daily, had hypoglycemia with 5 mg daily.  Continues metformin ER 500 twice daily.  Noting that a.m. sugars are relatively well controlled averaging around 100.  However has had 2 sugars in the 60s or asymptomatic about 2 -4 hours after lunch.  He reports that lunch is his lightest meal.  2 hours after breakfast or dinner however still with some high sugars at times above 200.  Dependent on diet and exercise levels.  These vary.  Weight is stable.  No symptoms of hypoglycemia. ?CAD with angina: On Imdur and beta-blocker.  Reports stable.  Gets mild chest pain intermittently.  Able to cut the grass and exercise still needs to take breaks.  No shortness of breath.  No lower extremity edema. ?Hypothyroidism: Reports good energy.  Due for thyroid recheck.  On levothyroxine daily.  Compliance is good. ?Hyperlipidemia on statins and Zetia.  Not fasting for recheck today. ?Bilateral knee osteoarthritis: We will have mild flares intermittently.  Voltaren gel helps.  No swelling. ? ?Wt Readings from Last 3 Encounters:  ?04/01/22 202 lb 1.6 oz (91.7 kg)  ?01/21/22 202 lb 6 oz (91.8 kg)  ?12/27/21 204 lb (92.5 kg)  ? ? ?BP Readings from Last 3 Encounters:  ?04/01/22 118/70  ?01/21/22 110/60  ?12/27/21 122/72  ? ? ?Assessment  ?1. Type 2 diabetes mellitus with vascular disease ([place])   ?2. Coronary artery disease of native artery of native heart with stable angina pectoris ([place])   ?3. Hyperlipidemia associated with type 2 diabetes mellitus ([place])   ?4. Hypertension associated with diabetes ([place])   ?5. Unstable angina (HCC)   ?6. Hypothyroidism (acquired)   ?7. Bilateral primary osteoarthritis of knee   ? ?  ?Plan  ?Diabetes is  currently very well controlled.  However we discussed the risk of hypoglycemia.  We discussed increasing caloric intake for lunch to decrease this risk.  Need to monitor carbs closely as he tends to be hyperglycemic with simple carbs.  Continue metformin XR 500 daily and Glucotrol 2.5 mg daily.  Recheck 3 months ?Hyperlipidemia and CAD: Check lipids, CMP and CBC.  Continue current medications. ?Recheck thyroid levels.  Has been well controlled. ?Discussed management of osteoarthritis.  Tylenol and Voltaren gel seem to be adequate right now ? ?Follow up: 3 months for complete physical and follow-up. ?Orders Placed This Encounter  ?Procedures  ? TSH  ? Lipid panel  ? Comprehensive metabolic panel  ? CBC with Differential/Platelet  ? POCT HgB A1C  ? ?Meds ordered this encounter  ?Medications  ? glipiZIDE (GLUCOTROL XL) 2.5 MG 24 hr tablet  ?  Sig: Take 1 tablet (2.5 mg total) by mouth daily with breakfast.  ?  Dispense:  90 tablet  ?  Refill:  3  ? ?  ? ?Immunization History  ?Administered Date(s) Administered  ? Fluad Quad(high Dose 65+) 09/16/2019, 09/05/2020, 09/13/2021  ? Influenza Split 11/01/2011, 09/22/2012  ? Influenza Whole 09/22/2009, 09/20/2010  ? Influenza, High Dose Seasonal PF 10/03/2015, 11/11/2016, 10/13/2017, 11/20/2018  ? Influenza,inj,Quad PF,6+ Mos 09/16/2013, 09/29/2014  ? PFIZER(Purple Top)SARS-COV-2 Vaccination 02/15/2020, 03/07/2020, 10/20/2020  ? Pneumococcal Conjugate-13 02/25/2014  ? Pneumococcal Polysaccharide-23  09/01/2007, 07/31/2011  ? Td 09/01/2007  ? Zoster Recombinat (Shingrix) 08/10/2020, 10/10/2020  ? ? ?Diabetes Related Lab Review: ?Lab Results  ?Component Value Date  ? HGBA1C 5.8 (A) 04/01/2022  ? HGBA1C 7.7 (A) 12/27/2021  ? HGBA1C 6.4 (A) 09/13/2021  ?  ?Lab Results  ?Component Value Date  ? MICROALBUR 2.1 (H) 09/13/2021  ? ?Lab Results  ?Component Value Date  ? CREATININE 1.07 09/13/2021  ? BUN 14 09/13/2021  ? NA 138 09/13/2021  ? K 4.1 09/13/2021  ? CL 104 09/13/2021  ? CO2 28  09/13/2021  ? ?Lab Results  ?Component Value Date  ? CHOL 103 09/13/2021  ? CHOL 122 07/24/2021  ? CHOL 145 04/27/2021  ? ?Lab Results  ?Component Value Date  ? HDL 40.00 09/13/2021  ? HDL 51 07/24/2021  ? HDL 42 04/27/2021  ? ?Lab Results  ?Component Value Date  ? [place] 43 09/13/2021  ? [place] 53 07/24/2021  ? [place] 84 04/27/2021  ? ?Lab Results  ?Component Value Date  ? TRIG 102.0 09/13/2021  ? TRIG 94 07/24/2021  ? TRIG 101 04/27/2021  ? ?Lab Results  ?Component Value Date  ? CHOLHDL 3 09/13/2021  ? CHOLHDL 2.4 07/24/2021  ? CHOLHDL 3.5 04/27/2021  ? ?No results found for: LDLDIRECT ?The ASCVD Risk score (Arnett DK, et al., 2019) failed to calculate for the following reasons: ?  The patient has a prior MI or stroke diagnosis ?I have reviewed the [place], Fam and Soc history. ?Patient Active Problem List  ? Diagnosis Date Noted  ? History of MI (myocardial infarction) 04/26/2020  ?  Priority: High  ?  2001 ? ?  ? Hx of CABG 04/26/2020  ?  Priority: High  ?  2013 ? ?  ? 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  Priority: High  ?  Neurology evaluating 08/2019; ? 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  ? ?  ? Hypertension associated with diabetes ([place]) 10/05/2018  ?  Priority: High  ? Type 2 diabetes mellitus with vascular disease ([place]) 08/12/2016  ?  Priority: High  ?  Low dose metformin due to GI side effects ? ?  ? Hypothyroidism (acquired) 05/29/2009  ?  Priority: High  ? Hyperlipidemia associated with type 2 diabetes mellitus ([place]) 09/01/2007  ?  Priority: High  ? Coronary artery disease of native artery of native heart with stable angina pectoris ([place])   ?  Priority: High  ? [place] 05/17/2021  ?  Priority: Medium   ?  Carotid dopplers 06/2021; bilateral minimal stenosis 1-39% ? ?  ? Bilateral primary osteoarthritis of knee 04/27/2020  ?  Priority: Medium   ?  xrays 04/2020, tricompartmental, worse medially ? ?  ? Diverticulosis 07/13/2019  ?  Priority: Medium   ? GERD 09/01/2007  ?  Priority: Medium   ?  Peptic ulcer 09/01/2007  ?  Priority: Medium   ? Former smoker 07/13/2019  ?  Priority: Low  ? Testosterone deficiency 09/01/2007  ?  Priority: Low  ? Unstable angina ([place]) 05/03/2021  ? Brachial neuritis or radiculitis, right 02/20/2009  ? ? ?Social History: ?Patient  reports that he quit smoking about 9 years ago. His smoking use included pipe and cigars. He has never used smokeless tobacco. He reports current alcohol use. He reports that he does not use drugs. ? ?Review of Systems: ?Ophthalmic: negative for eye pain, loss of vision or double vision ?Cardiovascular: negative for chest pain ?Respiratory: negative for SOB or persistent  cough ?Gastrointestinal: negative for abdominal pain ?Genitourinary: negative for dysuria or gross hematuria ?MSK: negative for foot lesions ?Neurologic: negative for weakness or gait disturbance ? ?Objective  ?Vitals: BP 118/70   Pulse 63   Temp 98.1 ?F (36.7 ?C)   Ht '5\' 7"'$  (1.702 m)   Wt 202 lb 1.6 oz (91.7 kg)   SpO2 95%   BMI 31.65 kg/m?  ?General: well appearing, no acute distress  ?Psych:  Alert and oriented, normal mood and affect ?HEENT:  Normocephalic, atraumatic, supple neck  ?Cardiovascular:  Nl S1 and S2, RRR without murmur, gallop or rub. no edema ?Respiratory:  Good breath sounds bilaterally, CTAB with normal effort, no rales ?Neurologic:   Mental status is normal ?Foot exam: no erythema, pallor, or cyanosis visible nl proprioception and sensation to monofilament testing bilaterally, +2 distal pulses bilaterally ? ?Diabetic education: ongoing education regarding chronic disease management for diabetes was given today. We continue to reinforce the ABC's of diabetic management: A1c (<7 or 8 dependent upon patient), tight blood pressure control, and cholesterol management with goal LDL < 100 minimally. We discuss diet strategies, exercise recommendations, medication options and possible side effects. At each visit, we review recommended immunizations and preventive  care recommendations for diabetics and stress that good diabetic control can prevent other problems. See below for this patient's data. ? ? ?Commons side effects, risks, benefits, and alternatives for medications

## 2022-04-01 NOTE — Patient Instructions (Signed)
Please return in 3 months for diabetes follow up and complete physical. ?Can cancel the appointment in June. ? ?I will release your lab results to you on your MyChart account with further instructions. You may see the results before I do, but when I review them I will send you a message with my report or have my assistant call you if things need to be discussed. Please reply to my message with any questions. Thank you!  ? ?If you have any questions or concerns, please don't hesitate to send me a message via MyChart or call the office at [phone]. Thank you for visiting with [place] today! It's our pleasure caring for you.  ?

## 2022-05-06 ENCOUNTER — Telehealth: Payer: Medicare HMO

## 2022-05-07 ENCOUNTER — Encounter: Payer: Medicare HMO | Admitting: Family Medicine

## 2022-05-09 ENCOUNTER — Other Ambulatory Visit: Payer: Self-pay | Admitting: Cardiology

## 2022-06-13 ENCOUNTER — Encounter (HOSPITAL_COMMUNITY): Payer: Medicare HMO

## 2022-06-17 ENCOUNTER — Other Ambulatory Visit (HOSPITAL_COMMUNITY): Payer: Self-pay | Admitting: Family Medicine

## 2022-06-17 DIAGNOSIS — I6523 Occlusion and stenosis of bilateral carotid arteries: Secondary | ICD-10-CM

## 2022-06-24 ENCOUNTER — Ambulatory Visit (HOSPITAL_COMMUNITY)
Admission: RE | Admit: 2022-06-24 | Discharge: 2022-06-24 | Disposition: A | Discharge status: Home / Self Care | Payer: Medicare HMO | Source: Ambulatory Visit | Attending: Cardiology | Admitting: Cardiology

## 2022-06-24 DIAGNOSIS — I6523 Occlusion and stenosis of bilateral carotid arteries: Secondary | ICD-10-CM | POA: Insufficient documentation

## 2022-07-03 DIAGNOSIS — E785 Hyperlipidemia, unspecified: Secondary | ICD-10-CM | POA: Diagnosis not present

## 2022-07-03 DIAGNOSIS — E119 Type 2 diabetes mellitus without complications: Secondary | ICD-10-CM | POA: Diagnosis not present

## 2022-07-03 DIAGNOSIS — R32 Unspecified urinary incontinence: Secondary | ICD-10-CM | POA: Diagnosis not present

## 2022-07-03 DIAGNOSIS — M1990 Unspecified osteoarthritis, unspecified site: Secondary | ICD-10-CM | POA: Diagnosis not present

## 2022-07-03 DIAGNOSIS — I252 Old myocardial infarction: Secondary | ICD-10-CM | POA: Diagnosis not present

## 2022-07-03 DIAGNOSIS — Z6830 Body mass index (BMI) 30.0-30.9, adult: Secondary | ICD-10-CM | POA: Diagnosis not present

## 2022-07-03 DIAGNOSIS — I25119 Atherosclerotic heart disease of native coronary artery with unspecified angina pectoris: Secondary | ICD-10-CM | POA: Diagnosis not present

## 2022-07-03 DIAGNOSIS — K219 Gastro-esophageal reflux disease without esophagitis: Secondary | ICD-10-CM | POA: Diagnosis not present

## 2022-07-03 DIAGNOSIS — N529 Male erectile dysfunction, unspecified: Secondary | ICD-10-CM | POA: Diagnosis not present

## 2022-07-03 DIAGNOSIS — E039 Hypothyroidism, unspecified: Secondary | ICD-10-CM | POA: Diagnosis not present

## 2022-07-03 DIAGNOSIS — E669 Obesity, unspecified: Secondary | ICD-10-CM | POA: Diagnosis not present

## 2022-07-03 DIAGNOSIS — I10 Essential (primary) hypertension: Secondary | ICD-10-CM | POA: Diagnosis not present

## 2022-07-03 NOTE — Progress Notes (Signed)
Please call patient: he had ultrasound of his carotids; the findings are stable.  I am not sure if his cardiologist ordered this test? Is he aware? No changes are needed at this time.

## 2022-07-08 ENCOUNTER — Other Ambulatory Visit: Payer: Self-pay | Admitting: Cardiology

## 2022-07-08 DIAGNOSIS — I2510 Atherosclerotic heart disease of native coronary artery without angina pectoris: Secondary | ICD-10-CM

## 2022-07-19 ENCOUNTER — Encounter: Payer: Self-pay | Admitting: Family Medicine

## 2022-07-19 ENCOUNTER — Ambulatory Visit (INDEPENDENT_AMBULATORY_CARE_PROVIDER_SITE_OTHER): Payer: Medicare HMO | Admitting: Family Medicine

## 2022-07-19 VITALS — BP 156/88 | HR 64 | Temp 97.7°F | Ht 67.0 in | Wt 201.4 lb

## 2022-07-19 DIAGNOSIS — R0989 Other specified symptoms and signs involving the circulatory and respiratory systems: Secondary | ICD-10-CM | POA: Diagnosis not present

## 2022-07-19 DIAGNOSIS — E1169 Type 2 diabetes mellitus with other specified complication: Secondary | ICD-10-CM

## 2022-07-19 DIAGNOSIS — I152 Hypertension secondary to endocrine disorders: Secondary | ICD-10-CM

## 2022-07-19 DIAGNOSIS — E1159 Type 2 diabetes mellitus with other circulatory complications: Secondary | ICD-10-CM

## 2022-07-19 DIAGNOSIS — R519 Headache, unspecified: Secondary | ICD-10-CM

## 2022-07-19 DIAGNOSIS — E785 Hyperlipidemia, unspecified: Secondary | ICD-10-CM | POA: Diagnosis not present

## 2022-07-19 DIAGNOSIS — Z0000 Encounter for general adult medical examination without abnormal findings: Secondary | ICD-10-CM

## 2022-07-19 DIAGNOSIS — I25118 Atherosclerotic heart disease of native coronary artery with other forms of angina pectoris: Secondary | ICD-10-CM

## 2022-07-19 DIAGNOSIS — E039 Hypothyroidism, unspecified: Secondary | ICD-10-CM | POA: Diagnosis not present

## 2022-07-19 LAB — POCT GLYCOSYLATED HEMOGLOBIN (HGB A1C): Hemoglobin A1C: 6.1 % — AB (ref 4.0–5.6)

## 2022-07-19 MED ORDER — METFORMIN HCL ER 500 MG PO TB24: 500.0000 mg | ORAL_TABLET | Freq: Every day | ORAL | 3 refills | Status: DC

## 2022-07-19 MED ORDER — ISOSORBIDE MONONITRATE ER 60 MG PO TB24: 60.0000 mg | ORAL_TABLET | Freq: Every day | ORAL | 0 refills | Status: DC

## 2022-07-19 NOTE — Progress Notes (Signed)
Subjective  Chief Complaint  Patient presents with   Annual Exam    Pt here for Annual exam and is currently fasting    Diabetes    HPI: Adrian Gamble is a 76 y.o. male who presents to [facility] today for a Male Wellness Visit. He also has the concerns and/or needs as listed above in the chief complaint. These will be addressed in addition to the Health Maintenance Visit.   Wellness Visit: annual visit with health maintenance review and exam   HM: screens are current. Imms current.   Body mass index is 31.54 kg/m. Wt Readings from Last 3 Encounters:  07/19/22 201 lb 6.4 oz (91.4 kg)  04/01/22 202 lb 1.6 oz (91.7 kg)  01/21/22 202 lb 6 oz (91.8 kg)     Chronic disease management visit and/or acute problem visit: DM: fastings are ok (120s-130s) however reports 3-4 hypoglycemic events, 1 symptomatic into the 4s. Others were 60s-70s. The low 50s was after cutting the grass. Discussed diet. Small lunches. Not on ace. A1c 6.1 today.  Angina on imdur and c/o feeling a light occipital headache, foggy vision and chest "anxiety" about 2 hours after taking the imdur daily. He tends to also feel tired and will feel better after a nap. He has checked his sugars and they are not low. He does not check his blood pressure at home. He is getting rare exertional angina on the imdur (when cutting grass). Denies double vision or sob. No sweats.  Reviewed labs from may for lipids, thyroid and renal function. All stable. Remains on zetia and asa and levothyroxine.   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   Hypertension associated with diabetes ([place]) 10/05/2018   Type 2 diabetes mellitus with vascular disease ([place]) 08/12/2016   Hypothyroidism (acquired) 05/29/2009   Hyperlipidemia associated with type 2 diabetes mellitus ([place]) 09/01/2007    Coronary artery disease of native artery of native heart with stable angina pectoris ([place])    Bruit 05/17/2021   Bilateral primary osteoarthritis of knee 04/27/2020   Diverticulosis 07/13/2019   GERD 09/01/2007   Peptic ulcer 09/01/2007   Former smoker 07/13/2019   Testosterone deficiency 09/01/2007   Health Maintenance  Topic Date Due   INFLUENZA VACCINE  07/02/2022   URINE MICROALBUMIN  09/13/2022   COVID-19 Vaccine (4 - Pfizer risk series) 08/04/2022 (Originally 12/15/2020)   OPHTHALMOLOGY EXAM  11/21/2022   HEMOGLOBIN A1C  01/19/2023   FOOT EXAM  04/02/2023   Pneumonia Vaccine 5+ Years old  Completed   Hepatitis C Screening  Completed   Zoster Vaccines- Shingrix  Completed   HPV VACCINES  Aged Out   COLONOSCOPY (Pts 45-31yr Insurance coverage will need to be confirmed)  Discontinued   TETANUS/TDAP  Discontinued   Immunization History  Administered Date(s) Administered   Fluad Quad(high Dose 65+) 09/16/2019, 09/05/2020, 09/13/2021   Influenza Split 11/01/2011, 09/22/2012   Influenza Whole 09/22/2009, 09/20/2010   Influenza, High Dose Seasonal PF 10/03/2015, 11/11/2016, 10/13/2017, 11/20/2018   Influenza,inj,Quad PF,6+ Mos 09/16/2013, 09/29/2014   PFIZER(Purple Top)SARS-COV-2 Vaccination 02/15/2020, 03/07/2020, 10/20/2020   Pneumococcal Conjugate-13 02/25/2014   Pneumococcal Polysaccharide-23 09/01/2007, 07/31/2011   Td 09/01/2007   Zoster Recombinat (Shingrix) 08/10/2020, 10/10/2020   We updated and reviewed the patient's past history in detail and it is documented below. Allergies: Patient is allergic to farxiga [  dapagliflozin] and oxycontin [oxycodone]. Past Medical History  has a past medical history of Bilateral primary osteoarthritis of knee (04/27/2020), CAD (coronary artery disease), s/p MI - s/p PCI to RCA 2001;  [place] 7/13: 3v CAD, good LVF - >  s/p CABG 7/13 (L-LAD, S-OM1/dCFX, S-dRCA), Diabetes mellitus type 2, controlled ([place]), Diverticulosis, Early cataracts,  bilateral, GERD (09/01/2007), H/O hiatal hernia, HLD (hyperlipidemia), on statin, Hypothyroidism. 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 Past Surgical History Patient  has a past surgical history that includes Cardiac catheterization (2005; 06/01/2012); Tonsillectomy and adenoidectomy (1955); arm surgery (5427); Colonoscopy (2005); Esophagogastroduodenoscopy; Coronary angioplasty with stent (2001); Coronary artery bypass graft (06/09/2012); percutaneous coronary stent intervention (pci-s) (N/A, 06/01/2012); and LEFT HEART CATH AND CORONARY ANGIOGRAPHY (N/A, 05/03/2021). Social History Patient  reports that he quit smoking about 10 years ago. His smoking use included pipe and cigars. He has never used smokeless tobacco. He reports current alcohol use. He reports that he does not use drugs. Family History family history includes Coronary artery disease (age of onset: 66) in his father; Memory loss in his father. Review of Systems: Constitutional: negative for fever or malaise Ophthalmic: negative for photophobia, double vision or loss of vision Cardiovascular: + for chest pain, No dyspnea on exertion, or new LE swelling Respiratory: negative for SOB or persistent cough Gastrointestinal: negative for abdominal pain, change in bowel habits or melena Genitourinary: negative for dysuria or gross hematuria Musculoskeletal: negative for new gait disturbance + muscular weakness Integumentary: negative for new or persistent rashes Neurological: negative for TIA or stroke symptoms Psychiatric: negative for SI or delusions Allergic/Immunologic: negative for hives  Patient Care Team    Relationship Specialty Notifications Start End  Leamon Arnt, MD PCP - General Family Medicine  09/16/19   Minus Breeding, MD PCP - Cardiology Cardiology Admissions 11/05/19   Grace Isaac, MD  (Inactive) Consulting Physician Cardiothoracic Surgery  06/02/12   Allyn Kenner, MD  Dermatology  09/16/19   Minus Breeding, MD Consulting Physician Cardiology  09/16/19   Dohmeier, Asencion Partridge, MD Consulting Physician Neurology  04/26/20   Edythe Clarity, [facility]  Pharmacist  12/25/21    Comment: [phone]   Objective  Vitals: BP (!) 156/88   Pulse 64   Temp 97.7 F (36.5 C)   Ht '5\' 7"'$  (1.702 m)   Wt 201 lb 6.4 oz (91.4 kg)   SpO2 97%   BMI 31.54 kg/m  General:  Well developed, well nourished, no acute distress  Psych:  Alert and orientedx3,normal mood and affect HEENT:  Normocephalic, atraumatic, non-icteric sclera, PERRL, oropharynx is clear without mass or exudate, supple neck without adenopathy, mass or thyromegaly Cardiovascular:  Normal S1, S2, RRR without gallop, rub or murmur, Respiratory:  Good breath sounds bilaterally, CTAB with normal respiratory effort Gastrointestinal: normal bowel sounds, soft, non-tender, no noted masses. No HSM Ext w/o edema   Assessment  1. Annual physical exam   2. Coronary artery disease of native artery of native heart with stable angina pectoris ([place])   3. Type 2 diabetes mellitus with vascular disease ([place])   4. Hypertension associated with diabetes ([place])   5. Hyperlipidemia associated with type 2 diabetes mellitus ([place])   6. Hypothyroidism (acquired)   7. Bruit   8. Occipital headache      Plan  Male Wellness Visit: Age appropriate Health Maintenance and Prevention measures were discussed with patient. Included topics are cancer screening recommendations, ways  to keep healthy (see AVS) including dietary and exercise recommendations, regular eye and dental care, use of seat belts, and avoidance of moderate alcohol use and tobacco use.  BMI: discussed patient's BMI and encouraged positive lifestyle modifications to help get to or maintain a target BMI. HM needs and immunizations were addressed and ordered. See below for orders. See HM and  immunization section for updates. Routine labs and screening tests ordered including cmp, cbc and lipids where appropriate. Discussed recommendations regarding Vit D and calcium supplementation (see AVS)  Chronic disease f/u and/or acute problem visit: (deemed necessary to be done in addition to the wellness visit): CAD: on imdur but feels it may be causing side effects: I question if bp is going low: pt to start home monitoring and we will lower imdur dose to '60mg'$ . He will monitor his sxs and angina. He may need to f/u with cards again. If this does not affect his headache/sxs, we will discuss other possibilities at f/u visit in 3 weeks. He will bring in bp readings.  HTN: typically low readings. Today elevated in office. Will start home monitoring. On bb. No ACE (check urine nephropathy and will start if +) DM: well controlled but having lows rarely. Discussed improving diet. Will decrease metformin xr to 500 daily. Cont glucotrol xl 2.5. if persists, need to change meds. Pt agrees.  Lipids and thyroid are at goal on crestor 40 and zetia 10 and levothyroxine 112 daily.  PAD on asa. Carotid dopplers with minimal stenosis bilaterally.   Follow up: Return in about 3 weeks (around 08/09/2022) for follow up Hypertension and leg pain.  Commons side effects, risks, benefits, and alternatives for medications and treatment plan prescribed today were discussed, and the patient expressed understanding of the given instructions. Patient is instructed to call or message via MyChart if he/she has any questions or concerns regarding our treatment plan. No barriers to understanding were identified. We discussed Red Flag symptoms and signs in detail. Patient expressed understanding regarding what to do in case of urgent or emergency type symptoms.  Medication list was reconciled, printed and provided to the patient in AVS. Patient instructions and summary information was reviewed with the patient as documented in the  AVS. This note was prepared with assistance of Dragon voice recognition software. Occasional wrong-word or sound-a-like substitutions may have occurred due to the inherent limitations of voice recognition software  This visit occurred during the SARS-CoV-2 public health emergency.  Safety protocols were in place, including screening questions prior to the visit, additional usage of staff PPE, and extensive cleaning of exam room while observing appropriate contact time as indicated for disinfecting solutions.   Orders Placed This Encounter  Procedures   Microalbumin / creatinine urine ratio   POCT glycosylated hemoglobin (Hb A1C)   Meds ordered this encounter  Medications   isosorbide mononitrate (IMDUR) 60 MG 24 hr tablet    Sig: Take 1 tablet (60 mg total) by mouth daily.    Dispense:  30 tablet    Refill:  0   metFORMIN (GLUCOPHAGE-XR) 500 MG 24 hr tablet    Sig: Take 1 tablet (500 mg total) by mouth daily with breakfast.    Dispense:  180 tablet    Refill:  3

## 2022-08-07 ENCOUNTER — Other Ambulatory Visit: Payer: Self-pay | Admitting: Cardiology

## 2022-08-09 ENCOUNTER — Ambulatory Visit (INDEPENDENT_AMBULATORY_CARE_PROVIDER_SITE_OTHER): Payer: Medicare HMO | Admitting: Family Medicine

## 2022-08-09 ENCOUNTER — Encounter: Payer: Self-pay | Admitting: Family Medicine

## 2022-08-09 VITALS — BP 126/72 | HR 64 | Temp 98.0°F | Ht 67.0 in | Wt 202.2 lb

## 2022-08-09 DIAGNOSIS — M79605 Pain in left leg: Secondary | ICD-10-CM

## 2022-08-09 DIAGNOSIS — I152 Hypertension secondary to endocrine disorders: Secondary | ICD-10-CM | POA: Diagnosis not present

## 2022-08-09 DIAGNOSIS — M170 Bilateral primary osteoarthritis of knee: Secondary | ICD-10-CM | POA: Diagnosis not present

## 2022-08-09 DIAGNOSIS — I25118 Atherosclerotic heart disease of native coronary artery with other forms of angina pectoris: Secondary | ICD-10-CM

## 2022-08-09 DIAGNOSIS — R519 Headache, unspecified: Secondary | ICD-10-CM | POA: Diagnosis not present

## 2022-08-09 DIAGNOSIS — M79604 Pain in right leg: Secondary | ICD-10-CM | POA: Diagnosis not present

## 2022-08-09 DIAGNOSIS — I200 Unstable angina: Secondary | ICD-10-CM | POA: Diagnosis not present

## 2022-08-09 DIAGNOSIS — E1159 Type 2 diabetes mellitus with other circulatory complications: Secondary | ICD-10-CM | POA: Diagnosis not present

## 2022-08-09 LAB — MICROALBUMIN / CREATININE URINE RATIO
Creatinine,U: 39.9 mg/dL
Microalb Creat Ratio: 9.7 mg/g (ref 0.0–30.0)
Microalb, Ur: 3.9 mg/dL — ABNORMAL HIGH (ref 0.0–1.9)

## 2022-08-09 LAB — HEPATIC FUNCTION PANEL
ALT: 13 U/L (ref 0–53)
AST: 15 U/L (ref 0–37)
Albumin: 4 g/dL (ref 3.5–5.2)
Alkaline Phosphatase: 38 U/L — ABNORMAL LOW (ref 39–117)
Bilirubin, Direct: 0.2 mg/dL (ref 0.0–0.3)
Total Bilirubin: 0.8 mg/dL (ref 0.2–1.2)
Total Protein: 6.8 g/dL (ref 6.0–8.3)

## 2022-08-09 LAB — CK: Total CK: 55 U/L (ref 7–232)

## 2022-08-09 LAB — VITAMIN D 25 HYDROXY (VIT D DEFICIENCY, FRACTURES): VITD: 41.51 ng/mL (ref 30.00–100.00)

## 2022-08-09 NOTE — Progress Notes (Signed)
Subjective  CC:   Chief Complaint  Patient presents with   Hypertension    Pt her to F/U with Bp   Leg Pain    Pt c/opain in both legs for the past 19mo. Pain is in the knee but it has gone up to his back    HPI: Adrian Gamble a 76y.o. male who presents to the office today to address the problems listed above in the chief complaint. See last note: this is a short term f/u visit to further evaluate hypoglycemia, hypotension, headaches, change in medications and new eval for leg pain; We decreased the dose of his Imdur from 120 mg daily to 60 mg daily.  Fortunately, he has done well with this change.  No longer having daily occipital headaches and not having increase in angina.  Blood pressures have responded well also.  No longer with orthostatic hypotension changes. Hypertension: He is on a beta-blocker and Imdur.  Blood pressures range from 100-120s over 60s to 80s. Diabetes with intermittent hypoglycemia: Eating a larger lunch especially when cutting grass, however sugars go down into the high 70s or low 80s after cutting his lawn.  Otherwise fastings are in the 100-120s range.  He does feel his lower blood sugars.  We decreased his metformin from 500 twice a day to 500 XR once a day.  He is tolerating this well.  No longer having significant hypoglycemia. He has known bilateral knee arthritis that bothers him intermittently.  Now complaining of about 3 to 421-monthistory of anterior thigh myalgias.  Mild aching, relieved with walking.  No groin pain.  No red swollen knees.  Has not taken anything for it.  Has been on Crestor for years.  No dose changes.  Legs are nontender.  No muscle cramps.  Assessment  1. Hypertension associated with diabetes ([place]  2. Type 2 diabetes mellitus with vascular disease ([facility]  3. Coronary artery disease of native artery of native heart with stable angina pectoris ([facility]  4. Unstable angina ([facility]  5. Occipital headache   6. Bilateral primary  osteoarthritis of knee   7. Bilateral leg pain      Plan   Hypertension f/u: BP control is well controlled.  Blood pressures running a little low but this is tolerated.  Needs to continue beta-blocker and Imdur Unstable angina: On Imdur, now tolerating Imdur 60 mg.  No longer having side effects of headaches or hypotension.  Continue Leg pain: Likely related to knee arthritis but could be related to statin, low vitamin D or other.  Recommend using Tylenol as needed.  Check CK, hepatic function and vitamin D levels.  He will follow-up if not improving.  Normal distal pulses.  Not consistent with claudication symptoms as improves with walking. Continue Voltaren gel as needed for knee arthritis  Education regarding management of these chronic disease states was given. Management strategies discussed on successive visits include dietary and exercise recommendations, goals of achieving and maintaining IBW, and lifestyle modifications aiming for adequate sleep and minimizing stressors.   Follow up: 6 months for recheck diabetes and blood pressure, sooner if needed for leg pain  Orders Placed This Encounter  Procedures   Microalbumin / creatinine urine ratio   VITAMIN D 25 Hydroxy (Vit-D Deficiency, Fractures)   Hepatic function panel   CK   No orders of the defined types were placed in this encounter.     BP Readings from Last 3 Encounters:  08/09/22  126/72  07/19/22 (!) 156/88  04/01/22 118/70   Wt Readings from Last 3 Encounters:  08/09/22 202 lb 3.2 oz (91.7 kg)  07/19/22 201 lb 6.4 oz (91.4 kg)  04/01/22 202 lb 1.6 oz (91.7 kg)    Lab Results  Component Value Date   CHOL 95 04/01/2022   CHOL 103 09/13/2021   CHOL 122 07/24/2021   Lab Results  Component Value Date   HDL 37.70 (L) 04/01/2022   HDL 40.00 09/13/2021   HDL 51 07/24/2021   Lab Results  Component Value Date   LDLCALC 38 04/01/2022   LDLCALC 43 09/13/2021   LDLCALC 53 07/24/2021   Lab Results  Component  Value Date   TRIG 99.0 04/01/2022   TRIG 102.0 09/13/2021   TRIG 94 07/24/2021   Lab Results  Component Value Date   CHOLHDL 3 04/01/2022   CHOLHDL 3 09/13/2021   CHOLHDL 2.4 07/24/2021   No results found for: "LDLDIRECT" Lab Results  Component Value Date   CREATININE 1.09 04/01/2022   BUN 16 04/01/2022   NA 139 04/01/2022   K 4.2 04/01/2022   CL 104 04/01/2022   CO2 24 04/01/2022    The ASCVD Risk score (Arnett DK, et al., 2019) failed to calculate for the following reasons:   The patient has a prior MI or stroke diagnosis  I reviewed the patients updated PMH, FH, and SocHx.    Patient Active Problem List   Diagnosis Date Noted   History of MI (myocardial infarction) 04/26/2020    Priority: High   Hx of CABG 04/26/2020    Priority: High   Sleep behavior disorder, REM 09/16/2019    Priority: High   Hypertension associated with diabetes ([place]) 10/05/2018    Priority: High   Type 2 diabetes mellitus with vascular disease ([place]) 08/12/2016    Priority: High   Hypothyroidism (acquired) 05/29/2009    Priority: High   Hyperlipidemia associated with type 2 diabetes mellitus ([place]) 09/01/2007    Priority: High   Coronary artery disease of native artery of native heart with stable angina pectoris [facility])     Priority: High   Bruit 05/17/2021    Priority: Medium    Bilateral primary osteoarthritis of knee 04/27/2020    Priority: Medium    Diverticulosis 07/13/2019    Priority: Medium    GERD 09/01/2007    Priority: Medium    Peptic ulcer 09/01/2007    Priority: Medium    Former smoker 07/13/2019    Priority: Low   Testosterone deficiency 09/01/2007    Priority: Low    Allergies: [name] [dapagliflozin] and Oxycontin [oxycodone]  Social History: Patient  reports that he quit smoking about 10 years ago. His smoking use included pipe and cigars. He has never used smokeless tobacco. He reports current alcohol use. He reports that he does not use drugs.  No outpatient  medications have been marked as taking for the 08/09/22 encounter (Office Visit) with Leamon Arnt, MD.    Review of Systems: Cardiovascular: negative for chest pain, palpitations, leg swelling, orthopnea Respiratory: negative for SOB, wheezing or persistent cough Gastrointestinal: negative for abdominal pain Genitourinary: negative for dysuria or gross hematuria  Objective  Vitals: BP 126/72   Pulse 64   Temp 98 F (36.7 C)   Ht '5\' 7"'$  (1.702 m)   Wt 202 lb 3.2 oz (91.7 kg)   SpO2 95%   BMI 31.67 kg/m  General: no acute distress  Psych:  Alert and oriented, normal  mood and affect HEENT:  Normocephalic, atraumatic, supple neck  Cardiovascular:  RRR  Respiratory:  Good breath sounds bilaterally, CTAB with normal respiratory effort Lower extremities: Osteoarthritic changes bilateral knees without erythema warmth or effusions.  No muscle tenderness Commons side effects, risks, benefits, and alternatives for medications and treatment plan prescribed today were discussed, and the patient expressed understanding of the given instructions. Patient is instructed to call or message via MyChart if he/she has any questions or concerns regarding our treatment plan. No barriers to understanding were identified. We discussed Red Flag symptoms and signs in detail. Patient expressed understanding regarding what to do in case of urgent or emergency type symptoms.  Medication list was reconciled, printed and provided to the patient in AVS. Patient instructions and summary information was reviewed with the patient as documented in the AVS. This note was prepared with assistance of Dragon voice recognition software. Occasional wrong-word or sound-a-like substitutions may have occurred due to the inherent limitations of voice recognition software  This visit occurred during the SARS-CoV-2 public health emergency.  Safety protocols were in place, including screening questions prior to the visit, additional  usage of staff PPE, and extensive cleaning of exam room while observing appropriate contact time as indicated for disinfecting solutions.

## 2022-08-09 NOTE — Patient Instructions (Signed)
Please return in 6 months for diabetes and blood pressure recheck.  Sooner if you need more help managing your arthritis.   I will release your lab results to you on your MyChart account with further instructions. You may see the results before I do, but when I review them I will send you a message with my report or have my assistant call you if things need to be discussed. Please reply to my message with any questions. Thank you!   See the medication list: the changes we have made are there.  Please do not take your glipizide on days when you cut the grass.   If you have any questions or concerns, please don't hesitate to send me a message via MyChart or call the office at [phone]. Thank you for visiting with [place] today! It's our pleasure caring for you.

## 2022-08-10 ENCOUNTER — Other Ambulatory Visit: Payer: Self-pay | Admitting: Family Medicine

## 2022-08-22 ENCOUNTER — Ambulatory Visit (INDEPENDENT_AMBULATORY_CARE_PROVIDER_SITE_OTHER): Payer: Medicare HMO

## 2022-08-22 DIAGNOSIS — Z0000 Encounter for general adult medical examination without abnormal findings: Secondary | ICD-10-CM

## 2022-08-22 NOTE — Patient Instructions (Signed)
[name] , Thank you for taking time to come for your Medicare Wellness Visit. I appreciate your ongoing commitment to your health goals. Please review the following plan we discussed and let me know if I can assist you in the future.   These are the goals we discussed:  Goals      Monitor and Manage My Blood Sugar-Diabetes Type 2     Timeframe:  Long-Range Goal Priority:  High Start Date:   12/25/21                          Expected End Date:    06/24/22                   Follow Up Date 03/25/22    - check blood sugar if I feel it is too high or too low - enter blood sugar readings and medication or insulin into daily log - take the blood sugar log to all doctor visits    Why is this important?   Checking your blood sugar at home helps to keep it from getting very high or very low.  Writing the results in a diary or log helps the doctor know how to care for you.  Your blood sugar log should have the time, date and the results.  Also, write down the amount of insulin or other medicine that you take.  Other information, like what you ate, exercise done and how you were feeling, will also be helpful.     Notes:      Patient Stated     Not at this time     Patient Stated     Keep sugar level down      Patient Stated     Maintain health and activity         This is a list of the screening recommended for you and due dates:  Health Maintenance  Topic Date Due   COVID-19 Vaccine (4 - Pfizer risk series) 08/25/2022*   Flu Shot  03/02/2023*   Eye exam for diabetics  11/21/2022   Hemoglobin A1C  01/19/2023   Yearly kidney function blood test for diabetes  04/02/2023   Complete foot exam   04/02/2023   Yearly kidney health urinalysis for diabetes  08/10/2023   Pneumonia Vaccine  Completed   Hepatitis C Screening: USPSTF Recommendation to screen - Ages 75-79 yo.  Completed   Zoster (Shingles) Vaccine  Completed   HPV Vaccine  Aged Out   Colon Cancer Screening  Discontinued    Tetanus Vaccine  Discontinued  *Topic was postponed. The date shown is not the original due date.    Advanced directives: Please bring a copy of your health care power of attorney and living will to the office at your convenience.  Conditions/risks identified: maintain health   Next appointment: Follow up in one year for your annual wellness visit.   Preventive Care 94 Years and Older, Male  Preventive care refers to lifestyle choices and visits with your health care provider that can promote health and wellness. What does preventive care include? A yearly physical exam. This is also called an annual well check. Dental exams once or twice a year. Routine eye exams. Ask your health care provider how often you should have your eyes checked. Personal lifestyle choices, including: Daily care of your teeth and gums. Regular physical activity. Eating a healthy diet. Avoiding tobacco and drug use. Limiting alcohol  use. Practicing safe sex. Taking low doses of aspirin every day. Taking vitamin and mineral supplements as recommended by your health care provider. What happens during an annual well check? The services and screenings done by your health care provider during your annual well check will depend on your age, overall health, lifestyle risk factors, and family history of disease. Counseling  Your health care provider may ask you questions about your: Alcohol use. Tobacco use. Drug use. Emotional well-being. Home and relationship well-being. Sexual activity. Eating habits. History of falls. Memory and ability to understand (cognition). Work and work Statistician. Screening  You may have the following tests or measurements: Height, weight, and BMI. Blood pressure. Lipid and cholesterol levels. These may be checked every 5 years, or more frequently if you are over 42 years old. Skin check. Lung cancer screening. You may have this screening every year starting at age 59 if  you have a 30-pack-year history of smoking and currently smoke or have quit within the past 15 years. Fecal occult blood test (FOBT) of the stool. You may have this test every year starting at age 59. Flexible sigmoidoscopy or colonoscopy. You may have a sigmoidoscopy every 5 years or a colonoscopy every 10 years starting at age 52. Prostate cancer screening. Recommendations will vary depending on your family history and other risks. Hepatitis C blood test. Hepatitis B blood test. Sexually transmitted disease (STD) testing. Diabetes screening. This is done by checking your blood sugar (glucose) after you have not eaten for a while (fasting). You may have this done every 1-3 years. Abdominal aortic aneurysm (AAA) screening. You may need this if you are a current or former smoker. Osteoporosis. You may be screened starting at age 70 if you are at high risk. Talk with your health care provider about your test results, treatment options, and if necessary, the need for more tests. Vaccines  Your health care provider may recommend certain vaccines, such as: Influenza vaccine. This is recommended every year. Tetanus, diphtheria, and acellular pertussis (Tdap, Td) vaccine. You may need a Td booster every 10 years. Zoster vaccine. You may need this after age 81. Pneumococcal 13-valent conjugate (PCV13) vaccine. One dose is recommended after age 40. Pneumococcal polysaccharide (PPSV23) vaccine. One dose is recommended after age 19. Talk to your health care provider about which screenings and vaccines you need and how often you need them. This information is not intended to replace advice given to you by your health care provider. Make sure you discuss any questions you have with your health care provider. Document Released: 12/15/2015 Document Revised: 08/07/2016 Document Reviewed: 09/19/2015 Elsevier Interactive Patient Education  2017 [place] Prevention in the Home Falls can cause  injuries. They can happen to people of all ages. There are many things you can do to make your home safe and to help prevent falls. What can I do on the outside of my home? Regularly fix the edges of walkways and driveways and fix any cracks. Remove anything that might make you trip as you walk through a door, such as a raised step or threshold. Trim any bushes or trees on the path to your home. Use bright outdoor lighting. Clear any walking paths of anything that might make someone trip, such as rocks or tools. Regularly check to see if handrails are loose or broken. Make sure that both sides of any steps have handrails. Any raised decks and porches should have guardrails on the edges. Have any leaves, snow, or  ice cleared regularly. Use sand or salt on walking paths during winter. Clean up any spills in your garage right away. This includes oil or grease spills. What can I do in the bathroom? Use night lights. Install grab bars by the toilet and in the tub and shower. Do not use towel bars as grab bars. Use non-skid mats or decals in the tub or shower. If you need to sit down in the shower, use a plastic, non-slip stool. Keep the floor dry. Clean up any water that spills on the floor as soon as it happens. Remove soap buildup in the tub or shower regularly. Attach bath mats securely with double-sided non-slip rug tape. Do not have throw rugs and other things on the floor that can make you trip. What can I do in the bedroom? Use night lights. Make sure that you have a light by your bed that is easy to reach. Do not use any sheets or blankets that are too big for your bed. They should not hang down onto the floor. Have a firm chair that has side arms. You can use this for support while you get dressed. Do not have throw rugs and other things on the floor that can make you trip. What can I do in the kitchen? Clean up any spills right away. Avoid walking on wet floors. Keep items that you  use a lot in easy-to-reach places. If you need to reach something above you, use a strong step stool that has a grab bar. Keep electrical cords out of the way. Do not use floor polish or wax that makes floors slippery. If you must use wax, use non-skid floor wax. Do not have throw rugs and other things on the floor that can make you trip. What can I do with my stairs? Do not leave any items on the stairs. Make sure that there are handrails on both sides of the stairs and use them. Fix handrails that are broken or loose. Make sure that handrails are as long as the stairways. Check any carpeting to make sure that it is firmly attached to the stairs. Fix any carpet that is loose or worn. Avoid having throw rugs at the top or bottom of the stairs. If you do have throw rugs, attach them to the floor with carpet tape. Make sure that you have a light switch at the top of the stairs and the bottom of the stairs. If you do not have them, ask someone to add them for you. What else can I do to help prevent falls? Wear shoes that: Do not have high heels. Have rubber bottoms. Are comfortable and fit you well. Are closed at the toe. Do not wear sandals. If you use a stepladder: Make sure that it is fully opened. Do not climb a closed stepladder. Make sure that both sides of the stepladder are locked into place. Ask someone to hold it for you, if possible. Clearly mark and make sure that you can see: Any grab bars or handrails. First and last steps. Where the edge of each step is. Use tools that help you move around (mobility aids) if they are needed. These include: Canes. Walkers. Scooters. Crutches. Turn on the lights when you go into a dark area. Replace any light bulbs as soon as they burn out. Set up your furniture so you have a clear path. Avoid moving your furniture around. If any of your floors are uneven, fix them. If there are any pets  around you, be aware of where they are. Review your  medicines with your doctor. Some medicines can make you feel dizzy. This can increase your chance of falling. Ask your doctor what other things that you can do to help prevent falls. This information is not intended to replace advice given to you by your health care provider. Make sure you discuss any questions you have with your health care provider. Document Released: 09/14/2009 Document Revised: 04/25/2016 Document Reviewed: 12/23/2014 Elsevier Interactive Patient Education  2017 [employer].

## 2022-08-22 NOTE — Progress Notes (Signed)
Virtual Visit via Telephone Note  I connected with  Adrian Gamble on 08/22/22 at 10:00 AM EDT by telephone and verified that I am speaking with the correct person using two identifiers.  Medicare Annual Wellness visit completed telephonically due to Covid-19 pandemic.   Persons participating in this call: This Health Coach and this patient.   Location: Patient: home Provider: office    I discussed the limitations, risks, security and privacy concerns of performing an evaluation and management service by telephone and the availability of in person appointments. The patient expressed understanding and agreed to proceed.  Unable to perform video visit due to video visit attempted and failed and/or patient does not have video capability.   Some vital signs may be absent or patient reported.   Willette Brace, LPN   Subjective:   Adrian Gamble is a 76 y.o. male who presents for Medicare Annual/Subsequent preventive examination.  Review of Systems     Cardiac Risk Factors include: advanced age (>39men, >20 women);diabetes mellitus;dyslipidemia;hypertension;male gender     Objective:    There were no vitals filed for this visit. There is no height or weight on file to calculate BMI.     08/22/2022   10:06 AM 09/28/2021   10:20 AM 05/03/2021    9:20 AM 09/04/2020   11:19 AM 08/12/2019   11:52 AM 04/21/2014    1:15 PM 06/10/2012   12:00 PM  Advanced Directives  Does Patient Have a Medical Advance Directive? Yes Yes Yes Yes No Patient does not have advance directive Patient has advance directive, copy not in chart  Type of Advance Directive [place];Living will Healthcare Power of [place];Living will [place];Living will   Living will;Healthcare Power of Attorney  Does patient want to make changes to medical advance directive?   No - Patient declined      Copy of [place] in Chart? No - copy requested  No - copy requested No - copy requested No - copy requested   Copy requested from family  Would patient like information on creating a medical advance directive?     No - Patient declined    Pre-existing out of facility DNR order (yellow form or pink MOST form)       No    Current Medications (verified) Outpatient Encounter Medications as of 08/22/2022  Medication Sig   aspirin EC 81 MG tablet Take 81 mg by mouth daily.   Blood Glucose Monitoring Suppl (ONETOUCH VERIO) w/Device KIT Use kit as directed.   ezetimibe (ZETIA) 10 MG tablet Take 1 tablet by mouth once daily   glipiZIDE (GLUCOTROL XL) 2.5 MG 24 hr tablet Take 1 tablet (2.5 mg total) by mouth daily with breakfast.   glucose blood test strip Use as instructed   isosorbide mononitrate (IMDUR) 60 MG 24 hr tablet Take 1 tablet by mouth once daily   levothyroxine (SYNTHROID) 112 MCG tablet Take 1 tablet (112 mcg total) by mouth daily.   Melatonin ER 5 MG TBCR Take 5 mg by mouth at bedtime.   metFORMIN (GLUCOPHAGE-XR) 500 MG 24 hr tablet Take 1 tablet (500 mg total) by mouth daily with breakfast.   metoprolol tartrate (LOPRESSOR) 25 MG tablet Take 1 tablet by mouth twice daily   OneTouch Delica Lancets 29W MISC Check blood sugar up to 3 times daily.   ONETOUCH VERIO test strip USE 1 STRIP TO CHECK GLUCOSE IN THE MORNING, AT NOON, AND AT  BEDTIME UP TO 3 TIMES A DAY   rosuvastatin (CRESTOR) 40 MG tablet Take 1 tablet (40 mg total) by mouth at bedtime.   Scar Treatment Products ([place]) GEL Apply 1 application topically 3 (three) times a week.   vitamin B-12 (CYANOCOBALAMIN) 100 MCG tablet Take 100 mcg by mouth daily.   nitroGLYCERIN (NITROSTAT) 0.4 MG SL tablet Place 1 tablet (0.4 mg total) under the tongue every 5 (five) minutes as needed.   No facility-administered encounter medications on file as of 08/22/2022.    Allergies (verified) [name] [dapagliflozin] and Oxycontin [oxycodone]   History: Past Medical History:  Diagnosis  Date   Bilateral primary osteoarthritis of knee 04/27/2020   xrays 04/2020, tricompartmental, worse medially   CAD (coronary artery disease), s/p MI - s/p PCI to RCA 2001;  [place] 7/13: 3v CAD, good LVF - >  s/p CABG 7/13 (L-LAD, S-OM1/dCFX, S-dRCA)    Diabetes mellitus type 2, controlled ([place])    Diverticulosis    Early cataracts, bilateral    GERD 09/01/2007   H/O hiatal hernia    HLD (hyperlipidemia), on statin    Hypothyroidism. 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   REM disorder   Testosterone deficiency    Past Surgical History:  Procedure Laterality Date   arm surgery  1957   d/t broken   CARDIAC CATHETERIZATION  2005; 06/01/2012   COLONOSCOPY  2005   CORONARY ANGIOPLASTY WITH STENT PLACEMENT  2001   RCA-1 stent   CORONARY ARTERY BYPASS GRAFT  06/09/2012   Procedure: CORONARY ARTERY BYPASS GRAFTING (CABG);  Surgeon: Grace Isaac, MD;  Location: [facility];  Service: Open Heart Surgery;  Laterality: N/A;  CABG x four;  using left internal mammary artery and right leg greater saphenous vein harvested endoscopically   ESOPHAGOGASTRODUODENOSCOPY     LEFT HEART CATH AND CORONARY ANGIOGRAPHY N/A 05/03/2021   Procedure: LEFT HEART CATH AND CORONARY ANGIOGRAPHY;  Surgeon: Martinique, Peter M, MD;  Location: [place] CV LAB;  Service: Cardiovascular;  Laterality: N/A;   PERCUTANEOUS CORONARY STENT INTERVENTION (PCI-S) N/A 06/01/2012   Procedure: PERCUTANEOUS CORONARY STENT INTERVENTION (PCI-S);  Surgeon: Burnell Blanks, MD;  [facility] CATH [facility];  Service: Cardiovascular;  Laterality: N/A;   TONSILLECTOMY AND ADENOIDECTOMY  1955   at age 3   Family History  Problem Relation Age of Onset   Memory loss Father    Coronary artery disease Father 28       cabg   Colon cancer Neg Hx    Social History   Socioeconomic History   Marital status: Married    Spouse name:  Not on file   Number of children: Not on file   Years of education: Not on file   Highest education level: Not on file  Occupational History   Occupation: Retired  Tobacco Use   Smoking status: Former    Types: Pipe, Cigars    Quit date: 06/01/2012    Years since quitting: 10.2   Smokeless tobacco: Never   Tobacco comments:    patient states that he is not somking the pipe or cigars any more  Substance and Sexual Activity   Alcohol use: Yes    Comment: rare   Drug use: No   Sexual activity: Yes  Other Topics Concern   Not on file  Social History Narrative  Not on file   Social Determinants of Health   Financial Resource Strain: Low Risk  (08/22/2022)   Overall Financial Resource Strain (CARDIA)    Difficulty of Paying Living Expenses: Not hard at all  Food Insecurity: No Food Insecurity (08/22/2022)   Hunger Vital Sign    Worried About Running Out of Food in the Last Year: Never true    Ran Out of Food in the Last Year: Never true  Transportation Needs: No Transportation Needs (08/22/2022)   PRAPARE - Hydrologist (Medical): No    Lack of Transportation (Non-Medical): No  Physical Activity: Insufficiently Active (08/22/2022)   Exercise Vital Sign    Days of Exercise per Week: 2 days    Minutes of Exercise per Session: 30 min  Stress: No Stress Concern Present (08/22/2022)   [place]    Feeling of Stress : Not at all  Social Connections: Moderately Integrated (08/22/2022)   Social Connection and Isolation Panel [NHANES]    Frequency of Communication with Friends and Family: More than three times a week    Frequency of Social Gatherings with Friends and Family: More than three times a week    Attends Religious Services: More than 4 times per year    Active Member of [employer] or Organizations: No    Attends Music therapist: Never    Marital Status: Married    Tobacco  Counseling Counseling given: Not Answered Tobacco comments: patient states that he is not somking the pipe or cigars any more   Clinical Intake:  Pre-visit preparation completed: Yes  Pain : No/denies pain     Diabetes: Yes CBG done?: No Did pt. bring in CBG monitor from home?: No  How often do you need to have someone help you when you read instructions, pamphlets, or other written materials from your doctor or pharmacy?: 1 - Never  Diabetic?Nutrition Risk Assessment:  Has the patient had any N/V/D within the last 2 months?  No  Does the patient have any non-healing wounds?  No  Has the patient had any unintentional weight loss or weight gain?  No   Diabetes:  Is the patient diabetic?  Yes  If diabetic, was a CBG obtained today?  No  Did the patient bring in their glucometer from home?  No  How often do you monitor your CBG's? Daily .   Financial Strains and Diabetes Management:  Are you having any financial strains with the device, your supplies or your medication? No .  Does the patient want to be seen by Chronic Care Management for management of their diabetes?  No  Would the patient like to be referred to a Nutritionist or for Diabetic Management?  No   Diabetic Exams:  Diabetic Eye Exam: Completed 12/ 21/22 Diabetic Foot Exam: Completed 04/01/22   Interpreter Needed?: No  Information entered by :: Charlott Rakes, LPN   Activities of Daily Living    08/22/2022   10:08 AM 09/28/2021   10:22 AM  In your present state of health, do you have any difficulty performing the following activities:  Hearing? 1 1  Comment slight loss slight loss  Vision? 0 0  Difficulty concentrating or making decisions? 0 1  Comment  short term  Walking or climbing stairs? 1 1  Comment  related to knees  Dressing or bathing? 0 0  Doing errands, shopping? 0 0  Preparing Food and eating ? N  N  Using the Toilet? N N  In the past six months, have you accidently leaked urine? Tempie Donning   Comment wears a pad wears depends  Do you have problems with loss of bowel control? N N  Managing your Medications? N N  Managing your Finances? N N  Housekeeping or managing your Housekeeping? N N    Patient Care Team: Leamon Arnt, MD as PCP - General (Family Medicine) Minus Breeding, MD as PCP - Cardiology (Cardiology) Grace Isaac, MD (Inactive) as Consulting Physician (Cardiothoracic Surgery) Allyn Kenner, MD (Dermatology) Minus Breeding, MD as Consulting Physician (Cardiology) Dohmeier, Asencion Partridge, MD as Consulting Physician (Neurology) Edythe Clarity, [facility] (Pharmacist)  Indicate any recent Medical Services you may have received from other than [facility] providers in the past year (date may be approximate).     Assessment:   This is a routine wellness examination for Adrian Gamble.  Hearing/Vision screen Hearing Screening - Comments:: Pt stated slight loss  Vision Screening - Comments:: Pt follows up with Dr Macarthur Critchley for annual eye exams   Dietary issues and exercise activities discussed: Current Exercise Habits: Home exercise routine, Type of exercise: walking, Time (Minutes): 30, Frequency (Times/Week): 2, Weekly Exercise (Minutes/Week): 60   Goals Addressed             This Visit's Progress    Patient Stated       Maintain health and activity        Depression Screen    08/22/2022   10:06 AM 08/09/2022   10:24 AM 07/19/2022    9:03 AM 09/28/2021   10:19 AM 09/13/2021    9:52 AM 09/04/2020   11:17 AM 04/26/2020    9:29 AM  PHQ 2/9 Scores  PHQ - 2 Score 0 0 0 0 0 0 1    Fall Risk    08/22/2022   10:08 AM 08/09/2022   10:24 AM 07/19/2022    9:02 AM 04/01/2022   10:23 AM 09/28/2021   10:21 AM  Fall Risk   Falls in the past year? 0 0 0 0 0  Number falls in past yr: 0 0 0 0 0  Injury with Fall? 0 0 0 0 0  Risk for fall due to : No Fall Risks;Impaired vision;Impaired balance/gait No Fall Risks No Fall Risks No Fall Risks Impaired vision;Impaired mobility   Follow up Falls prevention discussed Falls evaluation completed Falls evaluation completed Falls evaluation completed Falls prevention discussed    FALL RISK PREVENTION PERTAINING TO THE HOME:  Any stairs in or around the home? Yes  If so, are there any without handrails? No  Home free of loose throw rugs in walkways, pet beds, electrical cords, etc? Yes  Adequate lighting in your home to reduce risk of falls? Yes   ASSISTIVE DEVICES UTILIZED TO PREVENT FALLS:  Life alert? No  Use of a cane, walker or w/c? No  Grab bars in the bathroom? Yes  Shower chair or bench in shower? No  Elevated toilet seat or a handicapped toilet? No   TIMED UP AND GO:  Was the test performed? No .   Cognitive Function:        08/22/2022   10:09 AM 09/28/2021   10:23 AM 09/04/2020   11:24 AM  6CIT Screen  What Year? 0 points 0 points 0 points  What month? 0 points 0 points 0 points  What time? 0 points 0 points   Count back from 20 0 points 0 points 0  points  Months in reverse 0 points 0 points 0 points  Repeat phrase 0 points 0 points 0 points  Total Score 0 points 0 points     Immunizations Immunization History  Administered Date(s) Administered   Fluad Quad(high Dose 65+) 09/16/2019, 09/05/2020, 09/13/2021   Influenza Split 11/01/2011, 09/22/2012   Influenza Whole 09/22/2009, 09/20/2010   Influenza, High Dose Seasonal PF 10/03/2015, 11/11/2016, 10/13/2017, 11/20/2018   Influenza,inj,Quad PF,6+ Mos 09/16/2013, 09/29/2014   PFIZER(Purple Top)SARS-COV-2 Vaccination 02/15/2020, 03/07/2020, 10/20/2020   Pneumococcal Conjugate-13 02/25/2014   Pneumococcal Polysaccharide-23 09/01/2007, 07/31/2011   Td 09/01/2007   Zoster Recombinat (Shingrix) 08/10/2020, 10/10/2020      Flu Vaccine status: Due, Education has been provided regarding the importance of this vaccine. Advised may receive this vaccine at local pharmacy or Health Dept. Aware to provide a copy of the vaccination record if  obtained from local pharmacy or Health Dept. Verbalized acceptance and understanding.  Pneumococcal vaccine status: Up to date  Covid-19 vaccine status: Completed vaccines  Qualifies for Shingles Vaccine? Yes   Zostavax completed No   Shingrix Completed?: No.    Education has been provided regarding the importance of this vaccine. Patient has been advised to call insurance company to determine out of pocket expense if they have not yet received this vaccine. Advised may also receive vaccine at local pharmacy or Health Dept. Verbalized acceptance and understanding.  Screening Tests Health Maintenance  Topic Date Due   COVID-19 Vaccine (4 - Pfizer risk series) 08/25/2022 (Originally 12/15/2020)   INFLUENZA VACCINE  03/02/2023 (Originally 07/02/2022)   OPHTHALMOLOGY EXAM  11/21/2022   HEMOGLOBIN A1C  01/19/2023   Diabetic kidney evaluation - GFR measurement  04/02/2023   FOOT EXAM  04/02/2023   Diabetic kidney evaluation - Urine ACR  08/10/2023   Pneumonia Vaccine 6+ Years old  Completed   Hepatitis C Screening  Completed   Zoster Vaccines- Shingrix  Completed   HPV VACCINES  Aged Out   COLONOSCOPY (Pts 45-70yrs Insurance coverage will need to be confirmed)  Discontinued   TETANUS/TDAP  Discontinued    Health Maintenance  There are no preventive care reminders to display for this patient.  Colorectal cancer screening: No longer required.    Additional Screening:  Hepatitis C Screening: Completed 07/07/18  Vision Screening: Recommended annual ophthalmology exams for early detection of glaucoma and other disorders of the eye. Is the patient up to date with their annual eye exam?  Yes  Who is the provider or what is the name of the office in which the patient attends annual eye exams? Dr Wille Glaser [facility]  If pt is not established with a provider, would they like to be referred to a provider to establish care? No .   Dental Screening: Recommended annual dental exams for proper oral  hygiene  Community Resource Referral / Chronic Care Management: CRR required this visit?  No   CCM required this visit?  No      Plan:     I have personally reviewed and noted the following in the patient's chart:   Medical and social history Use of alcohol, tobacco or illicit drugs  Current medications and supplements including opioid prescriptions. Patient is not currently taking opioid prescriptions. Functional ability and status Nutritional status Physical activity Advanced directives List of other physicians Hospitalizations, surgeries, and ER visits in previous 12 months Vitals Screenings to include cognitive, depression, and falls Referrals and appointments  In addition, I have reviewed and discussed with patient certain preventive protocols, quality  metrics, and best practice recommendations. A written personalized care plan for preventive services as well as general preventive health recommendations were provided to patient.     Willette Brace, LPN   [id number]/[id number]   Nurse Notes: none

## 2022-09-24 DIAGNOSIS — L821 Other seborrheic keratosis: Secondary | ICD-10-CM | POA: Diagnosis not present

## 2022-09-24 DIAGNOSIS — L570 Actinic keratosis: Secondary | ICD-10-CM | POA: Diagnosis not present

## 2022-09-26 ENCOUNTER — Other Ambulatory Visit: Payer: Self-pay | Admitting: Family Medicine

## 2022-09-26 DIAGNOSIS — E118 Type 2 diabetes mellitus with unspecified complications: Secondary | ICD-10-CM

## 2022-10-09 NOTE — Progress Notes (Unsigned)
Cardiology Office Note   Date:  10/10/2022   ID:  Adrian Gamble, DOB June 20, 1946, MRN [id number]  PCP:  Leamon Arnt, MD  Cardiologist:   Minus Breeding, MD   Chief Complaint  Patient presents with   Chest Pain      History of Present Illness: Adrian Gamble is a 76 y.o. male who presents for follow up of CAD.  He is status post remote PCI to his RCA in 2001, HL, DM2, hypothyroidism, GERD. Cardiac cath in  06/01/12 and demonstrated 3 vessel CAD with preserved LV function. CABG was recommended. He was taken off of Plavix and discharged to home and brought back on 06/09/12. He underwent CABG with Dr. Servando Snare with grafts including LIMA-LAD, SVG-OM1 and distal circumflex, SVG-distal RCA. Cath in 2022 demonstrated patent grafts.    Since I last saw him he has had 1 fleeting episode of chest discomfort that lasted briefly and came back 3 nights in a row while he was seated in the chair.  It was a shooting discomfort on his left side down to the mid epigastric area.  It was unlike his previous angina.  He has been able to do things [place] his small yard.  He does not get any cardiovascular symptoms with this.  He denies any new shortness of breath, PND or orthopnea.  He has no new palpitations, presyncope or syncope.  I have been managing some chest discomfort and had tried to increase his Imdur previously 220 mg and he did not tolerate this.  He thinks however that 60 mg works well   Past Medical History:  Diagnosis Date   Bilateral primary osteoarthritis of knee 04/27/2020   xrays 04/2020, tricompartmental, worse medially   CAD (coronary artery disease), s/p MI - s/p PCI to RCA 2001;  [place] 7/13: 3v CAD, good LVF - >  s/p CABG 7/13 (L-LAD, S-OM1/dCFX, S-dRCA)    Diabetes mellitus type 2, controlled ([place])    Diverticulosis    Early cataracts, bilateral    GERD 09/01/2007   H/O hiatal hernia    HLD (hyperlipidemia), on statin    Hypothyroidism. 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   REM disorder   Testosterone deficiency     Past Surgical History:  Procedure Laterality Date   arm surgery  1957   d/t broken   CARDIAC CATHETERIZATION  2005; 06/01/2012   COLONOSCOPY  2005   CORONARY ANGIOPLASTY WITH STENT PLACEMENT  2001   RCA-1 stent   CORONARY ARTERY BYPASS GRAFT  06/09/2012   Procedure: CORONARY ARTERY BYPASS GRAFTING (CABG);  Surgeon: Grace Isaac, MD;  Location: [facility];  Service: Open Heart Surgery;  Laterality: N/A;  CABG x four;  using left internal mammary artery and right leg greater saphenous vein harvested endoscopically   ESOPHAGOGASTRODUODENOSCOPY     LEFT HEART CATH AND CORONARY ANGIOGRAPHY N/A 05/03/2021   Procedure: LEFT HEART CATH AND CORONARY ANGIOGRAPHY;  Surgeon: Martinique, Peter M, MD;  Location: [place] CV LAB;  Service: Cardiovascular;  Laterality: N/A;   PERCUTANEOUS CORONARY STENT INTERVENTION (PCI-S) N/A 06/01/2012   Procedure: PERCUTANEOUS CORONARY STENT INTERVENTION (PCI-S);  Surgeon: Burnell Blanks, MD;  Location: [facility] CATH LAB;  Service: Cardiovascular;  Laterality: N/A;   [place]   at age 64  Current Outpatient Medications  Medication Sig Dispense Refill   aspirin EC 81 MG tablet Take 81 mg by mouth daily.     Blood Glucose Monitoring Suppl (ONETOUCH VERIO) w/Device KIT Use kit as directed. 1 kit 0   ezetimibe (ZETIA) 10 MG tablet Take 1 tablet by mouth once daily 60 tablet 1   glipiZIDE (GLUCOTROL XL) 2.5 MG 24 hr tablet Take 1 tablet (2.5 mg total) by mouth daily with breakfast. 90 tablet 3   glucose blood test strip Use as instructed 100 each 12   isosorbide mononitrate (IMDUR) 60 MG 24 hr tablet Take 1 tablet by mouth once daily 90 tablet 3   levothyroxine (SYNTHROID) 112 MCG tablet Take 1 tablet (112 mcg total) by mouth daily. 90 tablet 3   Melatonin ER 5 MG TBCR Take 5 mg by  mouth at bedtime.     metFORMIN (GLUCOPHAGE-XR) 500 MG 24 hr tablet Take 1 tablet (500 mg total) by mouth daily with breakfast. 180 tablet 3   metoprolol tartrate (LOPRESSOR) 25 MG tablet Take 1 tablet by mouth twice daily 180 tablet 0   nitroGLYCERIN (NITROSTAT) 0.4 MG SL tablet Place 1 tablet (0.4 mg total) under the tongue every 5 (five) minutes as needed. 25 tablet 3   OneTouch Delica Lancets 28U MISC Check blood sugar up to 3 times daily. 90 each 1   ONETOUCH VERIO test strip USE 1 STRIP TO CHECK GLUCOSE IN THE MORNING ,  AT  NOON  AND  BEDTIME  UP  TO  3  TIMES  A  DAY. 100 each 0   rosuvastatin (CRESTOR) 40 MG tablet Take 1 tablet (40 mg total) by mouth at bedtime. 90 tablet 3   Scar Treatment Products (MEDERMA) GEL Apply 1 application topically 3 (three) times a week.     vitamin B-12 (CYANOCOBALAMIN) 100 MCG tablet Take 100 mcg by mouth daily.     No current facility-administered medications for this visit.    Allergies:   [name] [dapagliflozin] and Oxycontin [oxycodone]   ROS:  Please see the history of present illness.   Otherwise, review of systems are positive for none.   All other systems are reviewed and negative.    PHYSICAL EXAM: VS:  BP 112/64 (BP Location: Left Arm, Patient Position: Sitting, Cuff Size: Normal)   Pulse 67   Ht _0  (1.727 m)   Wt 205 lb (93 kg)   SpO2 95%   BMI 31.17 kg/m  , BMI Body mass index is 31.17 kg/m. GENERAL:  Well appearing NECK:  No jugular venous distention, waveform within normal limits, carotid upstroke brisk and symmetric, no bruits, no thyromegaly LUNGS:  Clear to auscultation bilaterally CHEST:  Well healed sternotomy scar. HEART:  PMI not displaced or sustained,S1 and S2 within normal limits, no S3, no S4, no clicks, no rubs, no murmurs ABD:  Flat, positive bowel sounds normal in frequency in pitch, no bruits, no rebound, no guarding, no midline pulsatile mass, no hepatomegaly, no splenomegaly EXT:  2 plus pulses throughout, no  edema, no cyanosis no clubbing   EKG:  EKG is ordered today. Sinus rhythm, rate 67, axis within normal limits, intervals within normal limits, premature atrial contractions, no acute ST-T wave changes.  Cardiac Cath Dominance: Right       Recent Labs: 04/01/2022: BUN 16; Creatinine, Ser 1.09; Hemoglobin 14.3; Platelets 140.0; Potassium 4.2; Sodium 139; TSH 0.73 08/09/2022: ALT 13    Lipid Panel    Component Value Date/Time  CHOL 95 04/01/2022 1050   CHOL 122 07/24/2021 1006   TRIG 99.0 04/01/2022 1050   HDL 37.70 (L) 04/01/2022 1050   HDL 51 07/24/2021 1006   CHOLHDL 3 04/01/2022 1050   VLDL 19.8 04/01/2022 1050   LDLCALC 38 04/01/2022 1050   LDLCALC 53 07/24/2021 1006      Wt Readings from Last 3 Encounters:  10/10/22 205 lb (93 kg)  08/09/22 202 lb 3.2 oz (91.7 kg)  07/19/22 201 lb 6.4 oz (91.4 kg)      Other studies Reviewed: Additional studies/ records that were reviewed today include: Labs Review of the above records demonstrates: See elsewhere   ASSESSMENT AND PLAN:  CAD :  The patient has no new sypmtoms.  No further cardiovascular testing is indicated.  We will continue with aggressive risk reduction and meds as listed.  DM:  A1C was 6.1.  No change in therapy.   Hyperlipidemia :    LDL was 38 with an HDL of 37.  No change in therapy.    HTN: The blood pressure is at target.  No change in therapy.  His blood pressure is at target.  No change in therapy.   Current medicines are reviewed at length with the patient today.  The patient does not have concerns regarding medicines.  The following changes have been made:  None  Labs/ tests ordered today include:   None  No orders of the defined types were placed in this encounter.    Disposition:   FU with 12 in  months   Signed, Minus Breeding, MD  10/10/2022 12:45 PM    [place]

## 2022-10-10 ENCOUNTER — Ambulatory Visit: Payer: Medicare HMO | Attending: Cardiology | Admitting: Cardiology

## 2022-10-10 ENCOUNTER — Encounter: Payer: Self-pay | Admitting: Cardiology

## 2022-10-10 VITALS — BP 112/64 | HR 67 | Ht 68.0 in | Wt 205.0 lb

## 2022-10-10 DIAGNOSIS — I10 Essential (primary) hypertension: Secondary | ICD-10-CM | POA: Diagnosis not present

## 2022-10-10 DIAGNOSIS — I25118 Atherosclerotic heart disease of native coronary artery with other forms of angina pectoris: Secondary | ICD-10-CM | POA: Diagnosis not present

## 2022-10-10 DIAGNOSIS — E785 Hyperlipidemia, unspecified: Secondary | ICD-10-CM | POA: Diagnosis not present

## 2022-10-10 DIAGNOSIS — E1159 Type 2 diabetes mellitus with other circulatory complications: Secondary | ICD-10-CM

## 2022-10-10 NOTE — Patient Instructions (Signed)
Medication Instructions:  Continue same medications *If you need a refill on your cardiac medications before your next appointment, please call your pharmacy*   Lab Work: None ordered   Testing/Procedures: None ordered   Follow-Up: At [facility], you and your health needs are our priority.  As part of our continuing mission to provide you with exceptional heart care, we have created designated Provider Care Teams.  These Care Teams include your primary Cardiologist (physician) and Advanced Practice Providers (APPs -  Physician Assistants and Nurse Practitioners) who all work together to provide you with the care you need, when you need it.  We recommend signing up for the patient portal called "MyChart".  Sign up information is provided on this After Visit Summary.  MyChart is used to connect with patients for Virtual Visits (Telemedicine).  Patients are able to view lab/test results, encounter notes, upcoming appointments, etc.  Non-urgent messages can be sent to your provider as well.   To learn more about what you can do with MyChart, go to [email or web address].    Your next appointment:  1 year   Call in July to schedule Nov appointment     The format for your next appointment: Office   Provider:  Dr.Hochrein   Important Information About Sugar

## 2022-10-30 ENCOUNTER — Ambulatory Visit (INDEPENDENT_AMBULATORY_CARE_PROVIDER_SITE_OTHER): Payer: Medicare HMO

## 2022-10-30 DIAGNOSIS — Z23 Encounter for immunization: Secondary | ICD-10-CM

## 2022-10-31 ENCOUNTER — Other Ambulatory Visit: Payer: Self-pay | Admitting: Cardiology

## 2022-10-31 DIAGNOSIS — I2510 Atherosclerotic heart disease of native coronary artery without angina pectoris: Secondary | ICD-10-CM

## 2022-12-05 DIAGNOSIS — H52223 Regular astigmatism, bilateral: Secondary | ICD-10-CM | POA: Diagnosis not present

## 2022-12-09 DIAGNOSIS — H26493 Other secondary cataract, bilateral: Secondary | ICD-10-CM | POA: Diagnosis not present

## 2022-12-09 LAB — HM DIABETES EYE EXAM

## 2022-12-14 ENCOUNTER — Other Ambulatory Visit: Payer: Self-pay | Admitting: Cardiology

## 2023-02-04 ENCOUNTER — Other Ambulatory Visit: Payer: Self-pay | Admitting: Cardiology

## 2023-02-04 DIAGNOSIS — I2510 Atherosclerotic heart disease of native coronary artery without angina pectoris: Secondary | ICD-10-CM

## 2023-02-06 ENCOUNTER — Encounter: Payer: Self-pay | Admitting: Family Medicine

## 2023-02-06 ENCOUNTER — Ambulatory Visit (INDEPENDENT_AMBULATORY_CARE_PROVIDER_SITE_OTHER): Payer: Medicare HMO | Admitting: Family Medicine

## 2023-02-06 VITALS — BP 120/72 | HR 62 | Temp 98.2°F | Ht 68.0 in | Wt 205.0 lb

## 2023-02-06 DIAGNOSIS — R413 Other amnesia: Secondary | ICD-10-CM

## 2023-02-06 DIAGNOSIS — E039 Hypothyroidism, unspecified: Secondary | ICD-10-CM | POA: Diagnosis not present

## 2023-02-06 DIAGNOSIS — E1169 Type 2 diabetes mellitus with other specified complication: Secondary | ICD-10-CM | POA: Diagnosis not present

## 2023-02-06 DIAGNOSIS — E1159 Type 2 diabetes mellitus with other circulatory complications: Secondary | ICD-10-CM | POA: Diagnosis not present

## 2023-02-06 DIAGNOSIS — R5383 Other fatigue: Secondary | ICD-10-CM

## 2023-02-06 DIAGNOSIS — I152 Hypertension secondary to endocrine disorders: Secondary | ICD-10-CM | POA: Diagnosis not present

## 2023-02-06 DIAGNOSIS — E785 Hyperlipidemia, unspecified: Secondary | ICD-10-CM | POA: Diagnosis not present

## 2023-02-06 DIAGNOSIS — I25118 Atherosclerotic heart disease of native coronary artery with other forms of angina pectoris: Secondary | ICD-10-CM | POA: Diagnosis not present

## 2023-02-06 LAB — COMPREHENSIVE METABOLIC PANEL
ALT: 17 U/L (ref 0–53)
AST: 20 U/L (ref 0–37)
Albumin: 4 g/dL (ref 3.5–5.2)
Alkaline Phosphatase: 45 U/L (ref 39–117)
BUN: 16 mg/dL (ref 6–23)
CO2: 25 mEq/L (ref 19–32)
Calcium: 9.4 mg/dL (ref 8.4–10.5)
Chloride: 105 mEq/L (ref 96–112)
Creatinine, Ser: 0.97 mg/dL (ref 0.40–1.50)
GFR: 74.82 mL/min (ref >60.00)
Glucose, Bld: 138 mg/dL — ABNORMAL HIGH (ref 70–99)
Potassium: 3.9 mEq/L (ref 3.5–5.1)
Sodium: 139 mEq/L (ref 135–145)
Total Bilirubin: 1.1 mg/dL (ref 0.2–1.2)
Total Protein: 6.5 g/dL (ref 6.0–8.3)

## 2023-02-06 LAB — LIPID PANEL
Cholesterol: 100 mg/dL (ref 0–200)
HDL: 35.5 mg/dL — ABNORMAL LOW (ref >39.00)
LDL Cholesterol: 40 mg/dL (ref 0–99)
NonHDL: 64.65
Total CHOL/HDL Ratio: 3
Triglycerides: 122 mg/dL (ref 0.0–149.0)
VLDL: 24.4 mg/dL (ref 0.0–40.0)

## 2023-02-06 LAB — POCT GLYCOSYLATED HEMOGLOBIN (HGB A1C): Hemoglobin A1C: 7 % — AB (ref 4.0–5.6)

## 2023-02-06 LAB — VITAMIN B12: Vitamin B-12: 573 pg/mL (ref 211–911)

## 2023-02-06 LAB — TSH: TSH: 1.43 u[IU]/mL (ref 0.35–5.50)

## 2023-02-06 MED ORDER — METFORMIN HCL ER 500 MG PO TB24: 500.0000 mg | ORAL_TABLET | Freq: Two times a day (BID) | ORAL | 3 refills | Status: DC

## 2023-02-06 MED ORDER — ROSUVASTATIN CALCIUM 40 MG PO TABS: 40.0000 mg | ORAL_TABLET | Freq: Every evening | ORAL | 3 refills | Status: DC

## 2023-02-06 MED ORDER — EZETIMIBE 10 MG PO TABS
10.0000 mg | ORAL_TABLET | Freq: Every day | ORAL | 3 refills | Status: DC
Start: 2023-02-06 — End: 2024-02-09

## 2023-02-06 NOTE — Progress Notes (Signed)
Subjective  CC:  Chief Complaint  Patient presents with   Hypertension   Diabetes    HPI: Adrian Gamble is a 77 y.o. male who presents to the office today for follow up of diabetes and problems listed above in the chief complaint.  Diabetes follow up: His diabetic control is reported as Unchanged. However, sugars are variable and he reports hard to predict. Diet was off over the holidays with more sweets. Weight is stable. Taking metformin xr 500 bid in stead of once a day and denies side effects.  He denies exertional CP or SOB or symptomatic hypoglycemia. He denies foot sores or paresthesias.  HTN: reviewed recent cardiology visit. Very normal bp 112/64 there. Home readings reports 120/60s typically. Mildly up in office today for unclear readings.  Angina and cad on imdur 60. Rare occurrence of chest pain. Feels it is stable.  C/o fatigue: needs more sleep. No sob. Depression screen is positive. More anhedonia but pt feels mood is fine. No h/o depression.  Also c/o memory problems: harder to remember names. No loss of function. Today's mini cog was normal with 3/3 recall and normal clock.  Low thyroid compliant with meds. Was at goal in September.  HLD on zetia and statin. Not complaining of leg pain today.     02/06/2023    9:53 AM 08/22/2022   10:06 AM 08/09/2022   10:24 AM 07/19/2022    9:03 AM 09/28/2021   10:19 AM  Depression screen PHQ 2/9  Decreased Interest 2 0 0 0 0  Down, Depressed, Hopeless 0 0 0 0 0  PHQ - 2 Score 2 0 0 0 0  Altered sleeping 0      Tired, decreased energy 3      Change in appetite 0      Feeling bad or failure about yourself  0      Trouble concentrating 2      Moving slowly or fidgety/restless 3      Suicidal thoughts 0      PHQ-9 Score 10      Difficult doing work/chores Not difficult at all         Wt Readings from Last 3 Encounters:  02/06/23 205 lb (93 kg)  10/10/22 205 lb (93 kg)  08/09/22 202 lb 3.2 oz (91.7 kg)    BP Readings from Last 3  Encounters:  02/06/23 120/72  10/10/22 112/64  08/09/22 126/72    Assessment  1. Hypertension associated with diabetes ([place])   2. 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 Hyperlipidemia associated with type 2 diabetes mellitus ([place])   5. Hypothyroidism (acquired)   6. Other fatigue   7. Memory changes      Plan  Diabetes is currently adequately controlled. Given comorbidities and h/o hypoglycemia, will continue with current meds; continue home glucose monitoring and bring readings in for me to next visit. Recheck renal function. On low dose glucotrol xl 2.5 w/ met er 1000 daily. Improve diet. Walk more now that weather has improved.  HTN: good control overall. Rare elevated readings. No adjustments in meds today Stable angina: on imdur 60. Can consider increasing back to 120 if worsens or if bp elevates.  HLD for fasting recheck today. On zetia and crestor.  Fatigue: r/o low thyroid, anemia but I am concerned about depression. Counseling done. Pt to self monitor. Recheck  3 months Memory: nl cog screen. Monitor. Start suduko/puzzles and recheck 3 months. Check tsh, rpr and b12; could be related to mood.  Follow up: Return in about 3 months (around 05/09/2023) for recheck.. Orders Placed This Encounter  Procedures   Comprehensive metabolic panel   Lipid panel   TSH   Vitamin B12   RPR   POCT HgB A1C   Meds ordered this encounter  Medications   ezetimibe (ZETIA) 10 MG tablet    Sig: Take 1 tablet (10 mg total) by mouth daily.    Dispense:  90 tablet    Refill:  3   rosuvastatin (CRESTOR) 40 MG tablet    Sig: Take 1 tablet (40 mg total) by mouth at bedtime.    Dispense:  90 tablet    Refill:  3   metFORMIN (GLUCOPHAGE-XR) 500 MG 24 hr tablet    Sig: Take 1 tablet (500 mg total) by mouth 2 (two) times daily with a meal.    Dispense:  180 tablet     Refill:  3      Immunization History  Administered Date(s) Administered   Fluad Quad(high Dose 65+) 09/16/2019, 09/05/2020, 09/13/2021, 10/30/2022   Influenza Split 11/01/2011, 09/22/2012   Influenza Whole 09/22/2009, 09/20/2010   Influenza, High Dose Seasonal PF 10/03/2015, 11/11/2016, 10/13/2017, 11/20/2018   Influenza,inj,Quad PF,6+ Mos 09/16/2013, 09/29/2014   PFIZER(Purple Top)SARS-COV-2 Vaccination 02/15/2020, 03/07/2020, 10/20/2020   Pneumococcal Conjugate-13 02/25/2014   Pneumococcal Polysaccharide-23 09/01/2007, 07/31/2011   Td 09/01/2007   Zoster Recombinat (Shingrix) 08/10/2020, 10/10/2020    Diabetes Related Lab Review: Lab Results  Component Value Date   HGBA1C 7.0 (A) 02/06/2023   HGBA1C 6.1 (A) 07/19/2022   HGBA1C 5.8 (A) 04/01/2022    Lab Results  Component Value Date   MICROALBUR 3.9 (H) 08/09/2022   Lab Results  Component Value Date   CREATININE 1.09 04/01/2022   BUN 16 04/01/2022   NA 139 04/01/2022   K 4.2 04/01/2022   CL 104 04/01/2022   CO2 24 04/01/2022   Lab Results  Component Value Date   CHOL 95 04/01/2022   CHOL 103 09/13/2021   CHOL 122 07/24/2021   Lab Results  Component Value Date   HDL 37.70 (L) 04/01/2022   HDL 40.00 09/13/2021   HDL 51 07/24/2021   Lab Results  Component Value Date   LDLCALC 38 04/01/2022   LDLCALC 43 09/13/2021   LDLCALC 53 07/24/2021   Lab Results  Component Value Date   TRIG 99.0 04/01/2022   TRIG 102.0 09/13/2021   TRIG 94 07/24/2021   Lab Results  Component Value Date   CHOLHDL 3 04/01/2022   CHOLHDL 3 09/13/2021   CHOLHDL 2.4 07/24/2021   No results found for: "LDLDIRECT" The ASCVD Risk score (Arnett DK, et al., 2019) failed to calculate for the following reasons:   The patient has a prior MI or stroke diagnosis I have reviewed the [place], Fam and Soc history. 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    Hypothyroidism (acquired) 05/29/2009    Priority: High   Hyperlipidemia associated with type 2 diabetes mellitus ([place]) 09/01/2007    Priority: High   Coronary artery disease of native artery of native heart with stable angina pectoris ([place])     Priority: High   Bruit 05/17/2021    Priority: Medium     Carotid dopplers 06/2022; bilateral minimal stenosis 1-39%    Bilateral primary osteoarthritis of knee 04/27/2020    Priority: Medium     xrays 04/2020, tricompartmental, worse medially    Diverticulosis 07/13/2019    Priority: Medium    GERD 09/01/2007    Priority: Medium    Peptic ulcer 09/01/2007    Priority: Medium    Former smoker 07/13/2019    Priority: Low   Testosterone deficiency 09/01/2007    Priority: Low    Social History: Patient  reports that he quit smoking about 10 years ago. His smoking use included pipe and cigars. He has never used smokeless tobacco. He reports current alcohol use. He reports that he does not use drugs.  Review of Systems: Ophthalmic: negative for eye pain, loss of vision or double vision Cardiovascular: negative for chest pain Respiratory: negative for SOB or persistent cough Gastrointestinal: negative for abdominal pain Genitourinary: negative for dysuria or gross hematuria MSK: negative for foot lesions Neurologic: negative for weakness or gait disturbance  Objective  Vitals: BP 120/72 Comment: by home readings and recent md visit  Pulse 62   Temp 98.2 F (36.8 C)   Ht '5\' 8"'$   (1.727 m)   Wt 205 lb (93 kg)   SpO2 95%   BMI 31.17 kg/m  General: well appearing, no acute distress  Psych:  Alert and oriented, flat mood and affect, speech is mildly slower than normal.  HEENT:  Normocephalic, atraumatic, moist mucous membranes, supple neck  Cardiovascular:  Nl S1 and S2, RRR without murmur, gallop or rub. no edema Respiratory:  Good breath sounds bilaterally, CTAB with normal effort, no rales Neurologic:   Mental status is normal.  Foot exam: no erythema, pallor, or cyanosis visible nl proprioception and sensation to monofilament testing bilaterally, +2 distal pulses bilaterally  Normal Mini-cog.   Diabetic education: ongoing education regarding chronic disease management for diabetes was given today. We continue to reinforce the ABC's of diabetic management: A1c (<7 or 8 dependent upon patient), tight blood pressure control, and cholesterol management with goal LDL < 100 minimally. We discuss diet strategies, exercise recommendations, medication options and possible side effects. At each visit, we review recommended immunizations and preventive care recommendations for diabetics and stress that good diabetic control can prevent other problems. See below for this patient's data.   Commons side effects, risks, benefits, and alternatives for medications and treatment plan prescribed today were discussed, and the patient expressed understanding of the given instructions. Patient is instructed to call or message via MyChart if he/she has any questions or concerns regarding our treatment plan. No barriers to understanding were identified. We discussed Red Flag symptoms and signs in detail. Patient expressed understanding regarding what to do in case of urgent or emergency type symptoms.  Medication list was reconciled, printed and provided to the patient in AVS. Patient instructions and summary information was reviewed with the patient as documented in the AVS. This note was  prepared with assistance of  Dragon [employer] forces training and education officer. Occasional wrong-word or sound-a-like substitutions may have occurred due to the inherent limitations of voice recognition software

## 2023-02-06 NOTE — Patient Instructions (Signed)
Please return in 3 months for diabetes follow up and for memory and mood recheck.   Please bring in your sugar readings for me next appointment.   I will release your lab results to you on your MyChart account with further instructions. You may see the results before I do, but when I review them I will send you a message with my report or have my assistant call you if things need to be discussed. Please reply to my message with any questions. Thank you!   If you have any questions or concerns, please don't hesitate to send me a message via MyChart or call the office at [phone]. Thank you for visiting with [place] today! It's our pleasure caring for you.

## 2023-02-07 LAB — RPR: RPR Ser Ql: NONREACTIVE

## 2023-02-14 ENCOUNTER — Other Ambulatory Visit: Payer: Self-pay | Admitting: Family Medicine

## 2023-02-14 DIAGNOSIS — E1159 Type 2 diabetes mellitus with other circulatory complications: Secondary | ICD-10-CM

## 2023-02-17 ENCOUNTER — Other Ambulatory Visit: Payer: Self-pay

## 2023-02-17 ENCOUNTER — Telehealth: Payer: Self-pay | Admitting: Family Medicine

## 2023-02-17 DIAGNOSIS — L853 Xerosis cutis: Secondary | ICD-10-CM | POA: Diagnosis not present

## 2023-02-17 DIAGNOSIS — D229 Melanocytic nevi, unspecified: Secondary | ICD-10-CM | POA: Diagnosis not present

## 2023-02-17 DIAGNOSIS — L570 Actinic keratosis: Secondary | ICD-10-CM | POA: Diagnosis not present

## 2023-02-17 DIAGNOSIS — L814 Other melanin hyperpigmentation: Secondary | ICD-10-CM | POA: Diagnosis not present

## 2023-02-17 DIAGNOSIS — R234 Changes in skin texture: Secondary | ICD-10-CM | POA: Diagnosis not present

## 2023-02-17 DIAGNOSIS — L578 Other skin changes due to chronic exposure to nonionizing radiation: Secondary | ICD-10-CM | POA: Diagnosis not present

## 2023-02-17 DIAGNOSIS — E1159 Type 2 diabetes mellitus with other circulatory complications: Secondary | ICD-10-CM

## 2023-02-17 DIAGNOSIS — L821 Other seborrheic keratosis: Secondary | ICD-10-CM | POA: Diagnosis not present

## 2023-02-17 MED ORDER — METFORMIN HCL ER 500 MG PO TB24: 500.0000 mg | ORAL_TABLET | Freq: Two times a day (BID) | ORAL | 3 refills | Status: DC

## 2023-02-17 NOTE — Telephone Encounter (Signed)
Patient requests RX for  metFORMIN (GLUCOPHAGE-XR) 500 MG 24 hr tablet be sent to (RX sent on 02/06/23 showing as a No Print) be sent to:  [place], [place] - [id number] Phone: [phone]  Fax: [phone]

## 2023-02-17 NOTE — Telephone Encounter (Signed)
Rx sent 

## 2023-04-03 ENCOUNTER — Other Ambulatory Visit: Payer: Self-pay | Admitting: Family Medicine

## 2023-04-03 DIAGNOSIS — E118 Type 2 diabetes mellitus with unspecified complications: Secondary | ICD-10-CM

## 2023-04-07 ENCOUNTER — Other Ambulatory Visit: Payer: Self-pay | Admitting: Family Medicine

## 2023-04-08 ENCOUNTER — Other Ambulatory Visit: Payer: Self-pay | Admitting: Family Medicine

## 2023-04-21 ENCOUNTER — Other Ambulatory Visit: Payer: Self-pay | Admitting: Family Medicine

## 2023-05-27 ENCOUNTER — Encounter: Payer: Self-pay | Admitting: Family Medicine

## 2023-05-27 ENCOUNTER — Ambulatory Visit (INDEPENDENT_AMBULATORY_CARE_PROVIDER_SITE_OTHER): Payer: Medicare HMO | Admitting: Family Medicine

## 2023-05-27 VITALS — BP 126/68 | HR 60 | Temp 98.1°F | Ht 68.0 in | Wt 195.8 lb

## 2023-05-27 DIAGNOSIS — Z7984 Long term (current) use of oral hypoglycemic drugs: Secondary | ICD-10-CM

## 2023-05-27 DIAGNOSIS — I152 Hypertension secondary to endocrine disorders: Secondary | ICD-10-CM | POA: Diagnosis not present

## 2023-05-27 DIAGNOSIS — I25118 Atherosclerotic heart disease of native coronary artery with other forms of angina pectoris: Secondary | ICD-10-CM

## 2023-05-27 DIAGNOSIS — E1159 Type 2 diabetes mellitus with other circulatory complications: Secondary | ICD-10-CM | POA: Diagnosis not present

## 2023-05-27 LAB — POCT GLYCOSYLATED HEMOGLOBIN (HGB A1C): Hemoglobin A1C: 6.1 % — AB (ref 4.0–5.6)

## 2023-05-27 NOTE — Progress Notes (Signed)
Subjective  CC:  Chief Complaint  Patient presents with   Hypertension   Diabetes    HPI: Adrian Gamble is a 77 y.o. male who presents to the office today for follow up of diabetes and problems listed above in the chief complaint.  Diabetes follow up: His diabetic control is reported as Improved.  He is eating better, walking and feels well.  Weight is down 10 pounds.  Sugars remain mostly controlled although he will have an occasional low to 58, he reports that happened twice.  He did have hypoglycemic awareness.  Sometimes sugars are unexpectedly high, greater than 200.  We discussed continuous glucose monitoring but at this time he needs a new form before that would be helpful.  He denies exertional CP or SOB He denies foot sores or paresthesias.  He continues to feel tired much of the time Blood pressure is doing well on his current medications.  No chest pain. Wt Readings from Last 3 Encounters:  05/27/23 195 lb 12.8 oz (88.8 kg)  02/06/23 205 lb (93 kg)  10/10/22 205 lb (93 kg)    BP Readings from Last 3 Encounters:  05/27/23 126/68  02/06/23 120/72  10/10/22 112/64    Assessment  1. Type 2 diabetes mellitus with vascular disease (HCC)   2. Hypertension associated with diabetes (HCC)   3. Coronary artery disease of native artery of native heart with stable angina pectoris (HCC)      Plan  Diabetes is currently very well controlled.  We do not need tight control given his age and comorbidities.  Given his fatigue, we will hold Glucotrol XL and see if his readings and symptoms improved.  Continue metformin. Blood pressure is controlled CAD stable on Imdur  Follow up: 3 months for complete physical Orders Placed This Encounter  Procedures   POCT HgB A1C   No orders of the defined types were placed in this encounter.     Immunization History  Administered Date(s) Administered   Fluad Quad(high Dose 65+) 09/16/2019, 09/05/2020, 09/13/2021, 10/30/2022   Influenza Split  11/01/2011, 09/22/2012   Influenza Whole 09/22/2009, 09/20/2010   Influenza, High Dose Seasonal PF 10/03/2015, 11/11/2016, 10/13/2017, 11/20/2018   Influenza,inj,Quad PF,6+ Mos 09/16/2013, 09/29/2014   PFIZER(Purple Top)SARS-COV-2 Vaccination 02/15/2020, 03/07/2020, 10/20/2020   Pneumococcal Conjugate-13 02/25/2014   Pneumococcal Polysaccharide-23 09/01/2007, 07/31/2011   Td 09/01/2007   Zoster Recombinat (Shingrix) 08/10/2020, 10/10/2020    Diabetes Related Lab Review: Lab Results  Component Value Date   HGBA1C 6.1 (A) 05/27/2023   HGBA1C 7.0 (A) 02/06/2023   HGBA1C 6.1 (A) 07/19/2022    Lab Results  Component Value Date   MICROALBUR 3.9 (H) 08/09/2022   Lab Results  Component Value Date   CREATININE 0.97 02/06/2023   BUN 16 02/06/2023   NA 139 02/06/2023   K 3.9 02/06/2023   CL 105 02/06/2023   CO2 25 02/06/2023   Lab Results  Component Value Date   CHOL 100 02/06/2023   CHOL 95 04/01/2022   CHOL 103 09/13/2021   Lab Results  Component Value Date   HDL 35.50 (L) 02/06/2023   HDL 37.70 (L) 04/01/2022   HDL 40.00 09/13/2021   Lab Results  Component Value Date   LDLCALC 40 02/06/2023   LDLCALC 38 04/01/2022   LDLCALC 43 09/13/2021   Lab Results  Component Value Date   TRIG 122.0 02/06/2023   TRIG 99.0 04/01/2022   TRIG 102.0 09/13/2021   Lab Results  Component Value Date  CHOLHDL 3 02/06/2023   CHOLHDL 3 04/01/2022   CHOLHDL 3 09/13/2021   No results found for: "LDLDIRECT" The ASCVD Risk score (Arnett DK, et al., 2019) failed to calculate for the following reasons:   The patient has a prior MI or stroke diagnosis I have reviewed the PMH, Fam and Soc history. 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 lewy body  dementia or parkinson's as cause.     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    Hypothyroidism (acquired) 05/29/2009    Priority: High   Hyperlipidemia associated with type 2 diabetes mellitus (HCC) 09/01/2007    Priority: High   Coronary artery disease of native artery of native heart with stable angina pectoris (HCC)     Priority: High   Bruit 05/17/2021    Priority: Medium     Carotid dopplers 06/2022; bilateral minimal stenosis 1-39%    Bilateral primary osteoarthritis of knee 04/27/2020    Priority: Medium     xrays 04/2020, tricompartmental, worse medially    Diverticulosis 07/13/2019    Priority: Medium    GERD 09/01/2007    Priority: Medium    Peptic ulcer 09/01/2007    Priority: Medium    Former smoker 07/13/2019    Priority: Low   Testosterone deficiency 09/01/2007    Priority: Low    Social History: Patient  reports that he quit smoking about 10 years ago. His smoking use included pipe and cigars. He has never used smokeless tobacco. He reports current alcohol use. He reports that he does not use drugs.  Review of Systems: Ophthalmic: negative for eye pain, loss of vision or double vision Cardiovascular: negative for chest pain Respiratory: negative for SOB or persistent cough Gastrointestinal: negative for abdominal pain Genitourinary: negative for dysuria or gross hematuria MSK: negative for foot lesions Neurologic: negative for weakness or gait disturbance  Objective  Vitals: BP 126/68   Pulse 60   Temp 98.1 F (36.7 C)   Ht 5\' 8"  (1.727 m)   Wt 195 lb 12.8 oz (88.8 kg)   SpO2 97%   BMI 29.77 kg/m  General: well appearing, no acute distress  Psych:  Alert and oriented, normal mood and affect HEENT:  Normocephalic, atraumatic, moist  mucous membranes, supple neck  Cardiovascular:  Nl S1 and S2, RRR without murmur, gallop or rub. no edema Respiratory:  Good breath sounds bilaterally, CTAB with normal effort, no rales  Diabetic education: ongoing education regarding chronic disease management for diabetes was given today. We continue to reinforce the ABC's of diabetic management: A1c (<7 or 8 dependent upon patient), tight blood pressure control, and cholesterol management with goal LDL < 100 minimally. We discuss diet strategies, exercise recommendations, medication options and possible side effects. At each visit, we review recommended immunizations and preventive care recommendations for diabetics and stress that good diabetic control can prevent other problems. See below for this patient's data.   Commons side effects, risks, benefits,  and alternatives for medications and treatment plan prescribed today were discussed, and the patient expressed understanding of the given instructions. Patient is instructed to call or message via MyChart if he/she has any questions or concerns regarding our treatment plan. No barriers to understanding were identified. We discussed Red Flag symptoms and signs in detail. Patient expressed understanding regarding what to do in case of urgent or emergency type symptoms.  Medication list was reconciled, printed and provided to the patient in AVS. Patient instructions and summary information was reviewed with the patient as documented in the AVS. This note was prepared with assistance of Dragon voice recognition software. Occasional wrong-word or sound-a-like substitutions may have occurred due to the inherent limitations of voice recognition software

## 2023-05-27 NOTE — Patient Instructions (Signed)
Please return in 3 months for your annual complete physical; please come fasting.   If you have any questions or concerns, please don't hesitate to send me a message via MyChart or call the office at [phone]. Thank you for visiting with us today! It's our pleasure caring for you.  

## 2023-07-07 ENCOUNTER — Other Ambulatory Visit: Payer: Self-pay | Admitting: Family Medicine

## 2023-07-17 ENCOUNTER — Encounter (INDEPENDENT_AMBULATORY_CARE_PROVIDER_SITE_OTHER): Payer: Self-pay

## 2023-07-18 DIAGNOSIS — M1990 Unspecified osteoarthritis, unspecified site: Secondary | ICD-10-CM | POA: Diagnosis not present

## 2023-07-18 DIAGNOSIS — E039 Hypothyroidism, unspecified: Secondary | ICD-10-CM | POA: Diagnosis not present

## 2023-07-18 DIAGNOSIS — E119 Type 2 diabetes mellitus without complications: Secondary | ICD-10-CM | POA: Diagnosis not present

## 2023-07-18 DIAGNOSIS — I252 Old myocardial infarction: Secondary | ICD-10-CM | POA: Diagnosis not present

## 2023-07-18 DIAGNOSIS — Z8249 Family history of ischemic heart disease and other diseases of the circulatory system: Secondary | ICD-10-CM | POA: Diagnosis not present

## 2023-07-18 DIAGNOSIS — I10 Essential (primary) hypertension: Secondary | ICD-10-CM | POA: Diagnosis not present

## 2023-07-18 DIAGNOSIS — E785 Hyperlipidemia, unspecified: Secondary | ICD-10-CM | POA: Diagnosis not present

## 2023-07-18 DIAGNOSIS — I25119 Atherosclerotic heart disease of native coronary artery with unspecified angina pectoris: Secondary | ICD-10-CM | POA: Diagnosis not present

## 2023-07-18 DIAGNOSIS — R32 Unspecified urinary incontinence: Secondary | ICD-10-CM | POA: Diagnosis not present

## 2023-07-18 DIAGNOSIS — R269 Unspecified abnormalities of gait and mobility: Secondary | ICD-10-CM | POA: Diagnosis not present

## 2023-07-18 DIAGNOSIS — Z7984 Long term (current) use of oral hypoglycemic drugs: Secondary | ICD-10-CM | POA: Diagnosis not present

## 2023-07-18 DIAGNOSIS — Z008 Encounter for other general examination: Secondary | ICD-10-CM | POA: Diagnosis not present

## 2023-07-18 DIAGNOSIS — Z87891 Personal history of nicotine dependence: Secondary | ICD-10-CM | POA: Diagnosis not present

## 2023-07-18 LAB — LAB REPORT - SCANNED
EGFR: 60
Microalb Creat Ratio: 78

## 2023-07-30 ENCOUNTER — Other Ambulatory Visit: Payer: Self-pay | Admitting: Family Medicine

## 2023-07-30 DIAGNOSIS — E118 Type 2 diabetes mellitus with unspecified complications: Secondary | ICD-10-CM

## 2023-08-07 ENCOUNTER — Other Ambulatory Visit: Payer: Self-pay | Admitting: Family Medicine

## 2023-08-28 ENCOUNTER — Ambulatory Visit: Payer: Medicare HMO

## 2023-08-28 VITALS — Wt 187.0 lb

## 2023-08-28 DIAGNOSIS — Z0000 Encounter for general adult medical examination without abnormal findings: Secondary | ICD-10-CM

## 2023-08-28 NOTE — Progress Notes (Signed)
Subjective:   Adrian Gamble is a 77 y.o. male who presents for Medicare Annual/Subsequent preventive examination.  Visit Complete: Virtual  I connected with  [name] on 08/28/23 by a audio enabled telemedicine application and verified that I am speaking with the correct person using two identifiers.  Patient Location: Home  Provider Location: Office/Clinic  I discussed the limitations of evaluation and management by telemedicine. The patient expressed understanding and agreed to proceed.  Because this visit was a virtual/telehealth visit, some criteria may be missing or patient reported. Any vitals not documented were not able to be obtained and vitals that have been documented are patient reported.   Cardiac Risk Factors include: advanced age (>67men, >24 women);diabetes mellitus;dyslipidemia;hypertension;male gender     Objective:    Today's Vitals   08/28/23 1019  Weight: 187 lb (84.8 kg)   Body mass index is 28.43 kg/m.     08/28/2023   10:22 AM 08/22/2022   10:06 AM 09/28/2021   10:20 AM 05/03/2021    9:20 AM 09/04/2020   11:19 AM 08/12/2019   11:52 AM 04/21/2014    1:15 PM  Advanced Directives  Does Patient Have a Medical Advance Directive? Yes Yes Yes Yes Yes No Patient does not have advance directive  Type of Advance Directive Healthcare Power of [place];Living will Healthcare Power of [place];Living will Healthcare Power of [employer] of [place];Living will Healthcare Power of [place];Living will    Does patient want to make changes to medical advance directive?    No - Patient declined     Copy of Healthcare Power of Attorney in Chart? No - copy requested No - copy requested No - copy requested No - copy requested No - copy requested    Would patient like information on creating a medical advance directive?      No - Patient declined     Current Medications (verified) Outpatient Encounter Medications as of 08/28/2023  Medication Sig   aspirin EC  81 MG tablet Take 81 mg by mouth daily.   Blood Glucose Monitoring Suppl (ONETOUCH VERIO) w/Device KIT Use kit as directed.   ezetimibe (ZETIA) 10 MG tablet Take 1 tablet (10 mg total) by mouth daily.   glipiZIDE (GLUCOTROL XL) 2.5 MG 24 hr tablet Take 1 tablet by mouth once daily with breakfast   glucose blood test strip Use as instructed   isosorbide mononitrate (IMDUR) 60 MG 24 hr tablet Take 1 tablet by mouth once daily   levothyroxine (SYNTHROID) 112 MCG tablet Take 1 tablet by mouth once daily   Melatonin ER 5 MG TBCR Take 5 mg by mouth at bedtime.   metFORMIN (GLUCOPHAGE-XR) 500 MG 24 hr tablet Take 1 tablet (500 mg total) by mouth 2 (two) times daily with a meal.   metoprolol tartrate (LOPRESSOR) 25 MG tablet Take 1 tablet (25 mg total) by mouth 2 (two) times daily.   OneTouch Delica Lancets 33G MISC Check blood sugar up to 3 times daily.   ONETOUCH VERIO test strip USE 1 STRIP TO CHECK GLUCOSE UP TO THREE TIMES DAILY IN THE MORNING ,  AT  NOON,  AND  BEDTIME.   rosuvastatin (CRESTOR) 40 MG tablet Take 1 tablet (40 mg total) by mouth at bedtime.   Scar Treatment Products (MEDERMA) GEL Apply 1 application topically 3 (three) times a week.   vitamin B-12 (CYANOCOBALAMIN) 100 MCG tablet Take 100 mcg by mouth daily.   nitroGLYCERIN (NITROSTAT) 0.4 MG SL tablet Place 1 tablet (0.4  mg total) under the tongue every 5 (five) minutes as needed.   No facility-administered encounter medications on file as of 08/28/2023.    Allergies (verified) [name] [dapagliflozin] and Oxycontin [oxycodone]   History: Past Medical History:  Diagnosis Date   Bilateral primary osteoarthritis of knee 04/27/2020   xrays 04/2020, tricompartmental, worse medially   CAD (coronary artery disease), s/p MI - s/p PCI to RCA 2001;  LHC 7/13: 3v CAD, good LVF - >  s/p CABG 7/13 (L-LAD, S-OM1/dCFX, S-dRCA)    Diabetes mellitus type 2, controlled (HCC)    Diverticulosis    Early cataracts, bilateral    GERD 09/01/2007    H/O hiatal hernia    HLD (hyperlipidemia), on statin    Hypothyroidism. 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   REM disorder   Testosterone deficiency    Past Surgical History:  Procedure Laterality Date   arm surgery  1957   d/t broken   CARDIAC CATHETERIZATION  2005; 06/01/2012   COLONOSCOPY  2005   CORONARY ANGIOPLASTY WITH STENT PLACEMENT  2001   RCA-1 stent   CORONARY ARTERY BYPASS GRAFT  06/09/2012   Procedure: CORONARY ARTERY BYPASS GRAFTING (CABG);  Surgeon: Delight Ovens, MD;  Location: [facility] OR;  Service: Open Heart Surgery;  Laterality: N/A;  CABG x four;  using left internal mammary artery and right leg greater saphenous vein harvested endoscopically   ESOPHAGOGASTRODUODENOSCOPY     LEFT HEART CATH AND CORONARY ANGIOGRAPHY N/A 05/03/2021   Procedure: LEFT HEART CATH AND CORONARY ANGIOGRAPHY;  Surgeon: Swaz[place], Peter M, MD;  Location: [facility] INVASIVE CV LAB;  Service: Cardiovascular;  Laterality: N/A;   PERCUTANEOUS CORONARY STENT INTERVENTION (PCI-S) N/A 06/01/2012   Procedure: PERCUTANEOUS CORONARY STENT INTERVENTION (PCI-S);  Surgeon: Kathleene Hazel, MD;  Location: [facility] CATH LAB;  Service: Cardiovascular;  Laterality: N/A;   TONSILLECTOMY AND ADENOIDECTOMY  1955   at age 43   Family History  Problem Relation Age of Onset   Memory loss Father    Coronary artery disease Father 76       cabg   Colon cancer Neg Hx    Social History   Socioeconomic History   Marital status: Married    Spouse name: Not on file   Number of children: Not on file   Years of education: Not on file   Highest education level: Not on file  Occupational History   Occupation: Retired  Tobacco Use   Smoking status: Former    Types: Pipe, Cigars    Quit date: 06/01/2012    Years since quitting: 11.2   Smokeless tobacco: Never   Tobacco comments:    patient states that he is  not somking the pipe or cigars any more  Substance and Sexual Activity   Alcohol use: Yes    Comment: rare   Drug use: No   Sexual activity: Yes  Other Topics Concern   Not on file  Social History Narrative   Not on file   Social Determinants of Health   Financial Resource Strain: Low Risk  (08/28/2023)   Overall Financial Resource Strain (CARDIA)    Difficulty of Paying Living Expenses: Not hard at all  Food Insecurity: No Food Insecurity (08/28/2023)   Hunger Vital Sign    Worried About Running Out of Food in the Last Year: Never true  Ran Out of Food in the Last Year: Never true  Transportation Needs: No Transportation Needs (08/28/2023)   PRAPARE - Administrator, Civil Service (Medical): No    Lack of Transportation (Non-Medical): No  Physical Activity: Inactive (08/28/2023)   Exercise Vital Sign    Days of Exercise per Week: 0 days    Minutes of Exercise per Session: 0 min  Stress: No Stress Concern Present (08/28/2023)   [employer] of Occupational Health - Occupational Stress Questionnaire    Feeling of Stress : Not at all  Social Connections: Moderately Integrated (08/28/2023)   Social Connection and Isolation Panel [NHANES]    Frequency of Communication with Friends and Family: More than three times a week    Frequency of Social Gatherings with Friends and Family: More than three times a week    Attends Religious Services: More than 4 times per year    Active Member of [employer] or Organizations: No    Attends Engineer, structural: Never    Marital Status: Married    Tobacco Counseling Counseling given: Not Answered Tobacco comments: patient states that he is not somking the pipe or cigars any more   Clinical Intake:  Pre-visit preparation completed: Yes  Pain : No/denies pain     BMI - recorded: 28.43 Nutritional Status: BMI 25 -29 Overweight Nutritional Risks: None Diabetes: Yes CBG done?: No Did pt. bring in CBG monitor from  home?: No  How often do you need to have someone help you when you read instructions, pamphlets, or other written materials from your doctor or pharmacy?: 1 - Never  Interpreter Needed?: No  Information entered by :: Lanier Ensign, LPN   Activities of Daily Living    08/28/2023   10:20 AM  In your present state of health, do you have any difficulty performing the following activities:  Hearing? 0  Vision? 0  Difficulty concentrating or making decisions? 0  Walking or climbing stairs? 0  Dressing or bathing? 0  Doing errands, shopping? 0  Preparing Food and eating ? N  Using the Toilet? N  In the past six months, have you accidently leaked urine? Y  Comment wears a pad  Do you have problems with loss of bowel control? N  Managing your Medications? N  Managing your Finances? N  Housekeeping or managing your Housekeeping? N    Patient Care Team: Willow Ora, MD as PCP - General (Family Medicine) Rollene Rotunda, MD as PCP - Cardiology (Cardiology) Delight Ovens, MD (Inactive) as Consulting Physician (Cardiothoracic Surgery) Nita Sells, MD (Dermatology) Rollene Rotunda, MD as Consulting Physician (Cardiology) Dohmeier, Porfirio Mylar, MD as Consulting Physician (Neurology) Erroll Luna, [facility] (Inactive) (Pharmacist)  Indicate any recent Medical Services you may have received from other than [facility] providers in the past year (date may be approximate).     Assessment:   This is a routine wellness examination for [place].  Hearing/Vision screen Hearing Screening - Comments:: Pt denies any hearing issues  Vision Screening - Comments:: Pt follows up with Dr Cletis [place] scott for annual eye exams    Goals Addressed             This Visit's Progress    Patient Stated       Keeping sugar levels down        Depression Screen    08/28/2023   10:23 AM 05/27/2023    9:57 AM 05/27/2023    9:54 AM 02/06/2023    9:53 AM  08/22/2022   10:06 AM 08/09/2022   10:24 AM 07/19/2022     9:03 AM  PHQ 2/9 Scores  PHQ - 2 Score 0 2 0 2 0 0 0  PHQ- 9 Score  13  10       Fall Risk    08/28/2023   10:24 AM 05/27/2023    9:54 AM 02/06/2023    9:52 AM 08/22/2022   10:08 AM 08/09/2022   10:24 AM  Fall Risk   Falls in the past year? 0 0 0 0 0  Number falls in past yr: 0 0 0 0 0  Injury with Fall? 0 0 0 0 0  Risk for fall due to : No Fall Risks No Fall Risks No Fall Risks No Fall Risks;Impaired vision;Impaired balance/gait No Fall Risks  Follow up Falls prevention discussed Falls evaluation completed Falls evaluation completed Falls prevention discussed Falls evaluation completed    MEDICARE RISK AT HOME: Medicare Risk at Home Any stairs in or around the home?: Yes If so, are there any without handrails?: No Home free of loose throw rugs in walkways, pet beds, electrical cords, etc?: Yes Adequate lighting in your home to reduce risk of falls?: Yes Life alert?: No Use of a cane, walker or w/c?: No Grab bars in the bathroom?: Yes Shower chair or bench in shower?: No Elevated toilet seat or a handicapped toilet?: No  TIMED UP AND GO:  Was the test performed?  No    Cognitive Function:        08/28/2023   10:25 AM 08/22/2022   10:09 AM 09/28/2021   10:23 AM 09/04/2020   11:24 AM  6CIT Screen  What Year? 0 points 0 points 0 points 0 points  What month? 0 points 0 points 0 points 0 points  What time? 0 points 0 points 0 points   Count back from 20 0 points 0 points 0 points 0 points  Months in reverse 0 points 0 points 0 points 0 points  Repeat phrase 0 points 0 points 0 points 0 points  Total Score 0 points 0 points 0 points     Immunizations Immunization History  Administered Date(s) Administered   Fluad Quad(high Dose 65+) 09/16/2019, 09/05/2020, 09/13/2021, 10/30/2022   Influenza Split 11/01/2011, 09/22/2012   Influenza Whole 09/22/2009, 09/20/2010   Influenza, High Dose Seasonal PF 10/03/2015, 11/11/2016, 10/13/2017, 11/20/2018   Influenza,inj,Quad PF,6+ Mos  09/16/2013, 09/29/2014   PFIZER(Purple Top)SARS-COV-2 Vaccination 02/15/2020, 03/07/2020, 10/20/2020   Pneumococcal Conjugate-13 02/25/2014   Pneumococcal Polysaccharide-23 09/01/2007, 07/31/2011   Td 09/01/2007   Zoster Recombinant(Shingrix) 08/10/2020, 10/10/2020      Flu Vaccine status: Due, Education has been provided regarding the importance of this vaccine. Advised may receive this vaccine at local pharmacy or Health Dept. Aware to provide a copy of the vaccination record if obtained from local pharmacy or Health Dept. Verbalized acceptance and understanding.  Pneumococcal vaccine status: Up to date  Covid-19 vaccine status: Information provided on how to obtain vaccines.   Qualifies for Shingles Vaccine? Yes   Zostavax completed Yes   Shingrix Completed?: Yes  Screening Tests Health Maintenance  Topic Date Due   COVID-19 Vaccine (4 - 2023-24 season) 08/03/2023   INFLUENZA VACCINE  03/01/2024 (Originally 07/03/2023)   HEMOGLOBIN A1C  11/26/2023   OPHTHALMOLOGY EXAM  12/10/2023   FOOT EXAM  02/06/2024   Diabetic kidney evaluation - eGFR measurement  07/17/2024   Diabetic kidney evaluation - Urine ACR  07/17/2024   Medicare Annual Wellness (  AWV)  08/27/2024   Pneumonia Vaccine 35+ Years old  Completed   Hepatitis C Screening  Completed   Zoster Vaccines- Shingrix  Completed   HPV VACCINES  Aged Out   DTaP/Tdap/Td  Discontinued   Colonoscopy  Discontinued    Health Maintenance  Health Maintenance Due  Topic Date Due   COVID-19 Vaccine (4 - 2023-24 season) 08/03/2023    Colorectal cancer screening: No longer required.    Additional Screening:  Hepatitis C Screening:  Completed 07/07/18  Vision Screening: Recommended annual ophthalmology exams for early detection of glaucoma and other disorders of the eye. Is the patient up to date with their annual eye exam?  Yes  Who is the provider or what is the name of the office in which the patient attends annual eye exams?  Dr Fredrich Birks  If pt is not established with a provider, would they like to be referred to a provider to establish care? No .   Dental Screening: Recommended annual dental exams for proper oral hygiene  Diabetic Foot Exam: Diabetic Foot Exam: Completed 02/06/24  Community Resource Referral / Chronic Care Management: CRR required this visit?  No   CCM required this visit?  No     Plan:     I have personally reviewed and noted the following in the patient's chart:   Medical and social history Use of alcohol, tobacco or illicit drugs  Current medications and supplements including opioid prescriptions. Patient is not currently taking opioid prescriptions. Functional ability and status Nutritional status Physical activity Advanced directives List of other physicians Hospitalizations, surgeries, and ER visits in previous 12 months Vitals Screenings to include cognitive, depression, and falls Referrals and appointments  In addition, I have reviewed and discussed with patient certain preventive protocols, quality metrics, and best practice recommendations. A written personalized care plan for preventive services as well as general preventive health recommendations were provided to patient.     Marzella Schlein, LPN   03/26/9562   After Visit Summary: (MyChart) Due to this being a telephonic visit, the after visit summary with patients personalized plan was offered to patient via MyChart   Nurse Notes: none

## 2023-08-28 NOTE — Patient Instructions (Signed)
[name] , Thank you for taking time to come for your Medicare Wellness Visit. I appreciate your ongoing commitment to your health goals. Please review the following plan we discussed and let me know if I can assist you in the future.   Referrals/Orders/Follow-Ups/Clinician Recommendations: Each day, aim for 6 glasses of water, plenty of protein in your diet and try to get up and walk/ stretch every hour for 5-10 minutes at a time.   Aim for 30 minutes of exercise or brisk walking, 6-8 glasses of water, and 5 servings of fruits and vegetables each day.   This is a list of the screening recommended for you and due dates:  Health Maintenance  Topic Date Due   COVID-19 Vaccine (4 - 2023-24 season) 08/03/2023   Flu Shot  03/01/2024*   Hemoglobin A1C  11/26/2023   Eye exam for diabetics  12/10/2023   Complete foot exam   02/06/2024   Yearly kidney function blood test for diabetes  07/17/2024   Yearly kidney health urinalysis for diabetes  07/17/2024   Medicare Annual Wellness Visit  08/27/2024   Pneumonia Vaccine  Completed   Hepatitis C Screening  Completed   Zoster (Shingles) Vaccine  Completed   HPV Vaccine  Aged Out   DTaP/Tdap/Td vaccine  Discontinued   Colon Cancer Screening  Discontinued  *Topic was postponed. The date shown is not the original due date.    Advanced directives: (Copy Requested) Please bring a copy of your health care power of attorney and living will to the office to be added to your chart at your convenience.  Next Medicare Annual Wellness Visit scheduled for next year: Yes

## 2023-08-29 ENCOUNTER — Encounter: Payer: Self-pay | Admitting: Family Medicine

## 2023-08-29 ENCOUNTER — Ambulatory Visit (INDEPENDENT_AMBULATORY_CARE_PROVIDER_SITE_OTHER): Payer: Medicare HMO | Admitting: Family Medicine

## 2023-08-29 VITALS — BP 116/60 | HR 59 | Temp 97.6°F | Ht 68.0 in | Wt 190.4 lb

## 2023-08-29 DIAGNOSIS — E785 Hyperlipidemia, unspecified: Secondary | ICD-10-CM | POA: Diagnosis not present

## 2023-08-29 DIAGNOSIS — I25118 Atherosclerotic heart disease of native coronary artery with other forms of angina pectoris: Secondary | ICD-10-CM

## 2023-08-29 DIAGNOSIS — I152 Hypertension secondary to endocrine disorders: Secondary | ICD-10-CM

## 2023-08-29 DIAGNOSIS — Z0000 Encounter for general adult medical examination without abnormal findings: Secondary | ICD-10-CM | POA: Diagnosis not present

## 2023-08-29 DIAGNOSIS — E039 Hypothyroidism, unspecified: Secondary | ICD-10-CM | POA: Diagnosis not present

## 2023-08-29 DIAGNOSIS — Z0001 Encounter for general adult medical examination with abnormal findings: Secondary | ICD-10-CM

## 2023-08-29 DIAGNOSIS — Z7984 Long term (current) use of oral hypoglycemic drugs: Secondary | ICD-10-CM | POA: Diagnosis not present

## 2023-08-29 DIAGNOSIS — Z23 Encounter for immunization: Secondary | ICD-10-CM

## 2023-08-29 DIAGNOSIS — M170 Bilateral primary osteoarthritis of knee: Secondary | ICD-10-CM

## 2023-08-29 DIAGNOSIS — E1169 Type 2 diabetes mellitus with other specified complication: Secondary | ICD-10-CM | POA: Diagnosis not present

## 2023-08-29 DIAGNOSIS — E1159 Type 2 diabetes mellitus with other circulatory complications: Secondary | ICD-10-CM | POA: Diagnosis not present

## 2023-08-29 LAB — COMPREHENSIVE METABOLIC PANEL
ALT: 15 U/L (ref 0–53)
AST: 19 U/L (ref 0–37)
Albumin: 4.2 g/dL (ref 3.5–5.2)
Alkaline Phosphatase: 42 U/L (ref 39–117)
BUN: 15 mg/dL (ref 6–23)
CO2: 28 meq/L (ref 19–32)
Calcium: 9.7 mg/dL (ref 8.4–10.5)
Chloride: 105 meq/L (ref 96–112)
Creatinine, Ser: 0.99 mg/dL (ref 0.40–1.50)
GFR: 72.73 mL/min (ref >60.00)
Glucose, Bld: 123 mg/dL — ABNORMAL HIGH (ref 70–99)
Potassium: 4.1 meq/L (ref 3.5–5.1)
Sodium: 141 meq/L (ref 135–145)
Total Bilirubin: 1 mg/dL (ref 0.2–1.2)
Total Protein: 6.6 g/dL (ref 6.0–8.3)

## 2023-08-29 LAB — LIPID PANEL
Cholesterol: 97 mg/dL (ref 0–200)
HDL: 37.1 mg/dL — ABNORMAL LOW (ref >39.00)
LDL Cholesterol: 38 mg/dL (ref 0–99)
NonHDL: 59.64
Total CHOL/HDL Ratio: 3
Triglycerides: 106 mg/dL (ref 0.0–149.0)
VLDL: 21.2 mg/dL (ref 0.0–40.0)

## 2023-08-29 LAB — HEMOGLOBIN A1C: Hgb A1c MFr Bld: 7.2 % — ABNORMAL HIGH (ref 4.6–6.5)

## 2023-08-29 LAB — TSH: TSH: 0.18 u[IU]/mL — ABNORMAL LOW (ref 0.35–5.50)

## 2023-08-29 NOTE — Progress Notes (Signed)
Subjective  Chief Complaint  Patient presents with   Annual Exam    Pt here for Annual Exam and is currently fasting    Diabetes   Hypertension    HPI: Adrian Gamble is a 77 y.o. male who presents to [facility] [place] today for a Male Wellness Visit. He also has the concerns and/or needs as listed above in the chief complaint. These will be addressed in addition to the Health Maintenance Visit.   Wellness Visit: annual visit with health maintenance review and exam   Health maintenance: Screens are all current.  Flu shot today.  Eligible for COVID booster.  Doing very well.  Body mass index is 28.95 kg/m. Wt Readings from Last 3 Encounters:  08/29/23 190 lb 6.4 oz (86.4 kg)  08/28/23 187 lb (84.8 kg)  05/27/23 195 lb 12.8 oz (88.8 kg)     Chronic disease management visit and/or acute problem visit: Chronic conditions including CAD, type 2 diabetes, hypertension hyperlipidemia hypothyroidism clinically well-controlled.  He says he is feeling pretty well.  At times is tired but since stopping glipizide he feels more energetic.  Sugars are mostly improved, no hypoglycemia, still gets an elevated reading here and there and he cannot quite figure out why.  Exercises intermittently with walking.  No chest pain or shortness of breath.  No foot sores.  Eye exam is current.  He is fasting to recheck his cholesterol panel today.  Has been at goal.  He continues to take his thyroid medicines.  Energy levels are fairly good except for some midday tiredness.  Assessment  1. Encounter for well adult exam with abnormal findings   2. Coronary artery disease of native artery of native heart with stable angina pectoris (HCC)   3. Type 2 diabetes mellitus with vascular disease (HCC)   4. Hypertension associated with diabetes (HCC)   5. Hyperlipidemia associated with type 2 diabetes mellitus (HCC)   6. Hypothyroidism (acquired)   7. Bilateral primary osteoarthritis of knee   8.  Need for influenza vaccination      Plan  Male Wellness Visit: Age appropriate Health Maintenance and Prevention measures were discussed with patient. Included topics are cancer screening recommendations, ways to keep healthy (see AVS) including dietary and exercise recommendations, regular eye and dental care, use of seat belts, and avoidance of moderate alcohol use and tobacco use.  BMI: discussed patient's BMI and encouraged positive lifestyle modifications to help get to or maintain a target BMI. HM needs and immunizations were addressed and ordered. See below for orders. See HM and immunization section for updates.  Flu shot updated today.  Patient to get COVID booster at pharmacy Routine labs and screening tests ordered including cmp, cbc and lipids where appropriate. Discussed recommendations regarding Vit D and calcium supplementation (see AVS)  Chronic disease f/u and/or acute problem visit: (deemed necessary to be done in addition to the wellness visit): Type 2 diabetes: Clinically improved, no further episodes of hypoglycemia.  Check A1c.  Flu shot updated.  Metformin XR 500 daily Coronary disease: Follows with cardiology.  No true anginal symptoms.  Continues with isosorbide and metoprolol Hypertension is very well-controlled.  Continue metoprolol 25 twice daily Hypothyroidism on levothyroxine 112 daily.  Recheck today  Follow up: 6 months to recheck diabetes and blood pressure Commons side effects, risks, benefits, and alternatives for medications and treatment plan prescribed today were discussed, and the patient expressed understanding of the given instructions. Patient is instructed to call  or message via MyChart if he/she has any questions or concerns regarding our treatment plan. No barriers to understanding were identified. We discussed Red Flag symptoms and signs in detail. Patient expressed understanding regarding what to do in case of urgent or emergency type symptoms.   Medication list was reconciled, printed and provided to the patient in AVS. Patient instructions and summary information was reviewed with the patient as documented in the AVS. This note was prepared with assistance of Dragon voice recognition software. Occasional wrong-word or sound-a-like substitutions may have occurred due to the inherent limitations of voice recognition software  Orders Placed This Encounter  Procedures   Flu Vaccine Trivalent High Dose (Fluad)   Comprehensive metabolic panel   Hemoglobin A1c   Lipid panel   TSH   No orders of the defined types were placed in this encounter.    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   Hypertension associated with diabetes (HCC) 10/05/2018   Type 2 diabetes mellitus with vascular disease (HCC) 08/12/2016   Hypothyroidism (acquired) 05/29/2009   Hyperlipidemia associated with type 2 diabetes mellitus (HCC) 09/01/2007   Coronary artery disease of native artery of native heart with stable angina pectoris (HCC)    Bruit 05/17/2021   Bilateral primary osteoarthritis of knee 04/27/2020   Diverticulosis 07/13/2019   GERD 09/01/2007   Peptic ulcer 09/01/2007   Former smoker 07/13/2019   Testosterone deficiency 09/01/2007   Health Maintenance  Topic Date Due   COVID-19 Vaccine (4 - 2023-24 season) 09/14/2023 (Originally 08/03/2023)   INFLUENZA VACCINE  03/01/2024 (Originally 07/03/2023)   HEMOGLOBIN A1C  11/26/2023   OPHTHALMOLOGY EXAM  12/10/2023   FOOT EXAM  02/06/2024   Diabetic kidney evaluation - eGFR measurement  07/17/2024   Diabetic kidney evaluation - Urine ACR  07/17/2024   Medicare Annual Wellness (AWV)  08/27/2024   Pneumonia Vaccine 22+ Years old  Completed   Hepatitis C Screening  Completed   Zoster Vaccines- Shingrix  Completed   HPV VACCINES  Aged Out   DTaP/Tdap/Td  Discontinued    Colonoscopy  Discontinued   Immunization History  Administered Date(s) Administered   Fluad Quad(high Dose 65+) 09/16/2019, 09/05/2020, 09/13/2021, 10/30/2022   Influenza Split 11/01/2011, 09/22/2012   Influenza Whole 09/22/2009, 09/20/2010   Influenza, High Dose Seasonal PF 10/03/2015, 11/11/2016, 10/13/2017, 11/20/2018   Influenza,inj,Quad PF,6+ Mos 09/16/2013, 09/29/2014   PFIZER(Purple Top)SARS-COV-2 Vaccination 02/15/2020, 03/07/2020, 10/20/2020   Pneumococcal Conjugate-13 02/25/2014   Pneumococcal Polysaccharide-23 09/01/2007, 07/31/2011   Td 09/01/2007   Zoster Recombinant(Shingrix) 08/10/2020, 10/10/2020   We updated and reviewed the patient's past history in detail and it is documented below. Allergies: Patient is allergic to farxiga [dapagliflozin] and oxycontin [oxycodone]. Past Medical History  has a past medical history of Bilateral primary osteoarthritis of knee (04/27/2020), CAD (coronary artery disease), s/p MI - s/p PCI to RCA 2001;  LHC 7/13: 3v CAD, good LVF - >  s/p CABG 7/13 (L-LAD, S-OM1/dCFX, S-dRCA), Diabetes mellitus type 2, controlled (HCC), Diverticulosis, Early cataracts, bilateral, GERD (09/01/2007), H/O hiatal hernia, HLD (hyperlipidemia), on statin, Hypothyroidism. 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 Past Surgical History Patient  has a past surgical history that includes Cardiac catheterization (2005; 06/01/2012); Tonsillectomy and adenoidectomy (1955); arm surgery (9528); Colonoscopy (2005); Esophagogastroduodenoscopy; Coronary angioplasty with stent (2001); Coronary  artery bypass graft (06/09/2012); percutaneous coronary stent intervention (pci-s) (N/A, 06/01/2012); and LEFT HEART CATH AND CORONARY ANGIOGRAPHY (N/A, 05/03/2021). Social History Patient  reports that he quit smoking about 11 years ago. His smoking use included pipe and cigars. He  has never used smokeless tobacco. He reports current alcohol use. He reports that he does not use drugs. Family History family history includes Coronary artery disease (age of onset: 42) in his father; Memory loss in his father. Review of Systems: Constitutional: negative for fever or malaise Ophthalmic: negative for photophobia, double vision or loss of vision Cardiovascular: negative for chest pain, dyspnea on exertion, or new LE swelling Respiratory: negative for SOB or persistent cough Gastrointestinal: negative for abdominal pain, change in bowel habits or melena Genitourinary: negative for dysuria or gross hematuria Musculoskeletal: negative for new gait disturbance or muscular weakness Integumentary: negative for new or persistent rashes Neurological: negative for TIA or stroke symptoms Psychiatric: negative for SI or delusions Allergic/Immunologic: negative for hives  Patient Care Team    Relationship Specialty Notifications Start End  Willow Ora, MD PCP - General Family Medicine  09/16/19   Rollene Rotunda, MD PCP - Cardiology Cardiology Admissions 11/05/19   Delight Ovens, MD (Inactive) Consulting Physician Cardiothoracic Surgery  06/02/12   Nita Sells, MD  Dermatology  09/16/19   Rollene Rotunda, MD Consulting Physician Cardiology  09/16/19   Dohmeier, Porfirio Mylar, MD Consulting Physician Neurology  04/26/20   Erroll Luna, [facility] (Inactive)  Pharmacist  12/25/21    Comment: [phone]  Elson Clan Consulting Physician Dermatology  08/29/23    Objective  Vitals: BP 116/60   Pulse (!) 59   Temp 97.6 F (36.4 C)   Ht 5\' 8"  (1.727 m)   Wt 190 lb 6.4 oz (86.4 kg)   SpO2 95%   BMI 28.95 kg/m  General:  Well developed, well nourished, no acute distress  Psych:  Alert and orientedx3,normal mood and affect HEENT:  Normocephalic, atraumatic, non-icteric sclera,  oropharynx is clear without mass or exudate, supple neck without adenopathy, or  thyromegaly Cardiovascular:  Normal S1, S2, RRR without gallop, rub or murmur,  Respiratory:  Good breath sounds bilaterally, CTAB with normal respiratory effort Gastrointestinal: normal bowel sounds, soft, non-tender, no noted masses. No HSM MSK: Joints are without erythema or swelling.  Skin:  Warm, no rashes Neurologic:    Mental status is normal.  Gross motor and sensory exams are normal. Stable gait. No tremor

## 2023-08-29 NOTE — Patient Instructions (Signed)
Please return in 6 months to recheck diabetes and blood pressure   I will release your lab results to you on your MyChart account with further instructions. You may see the results before I do, but when I review them I will send you a message with my report or have my assistant call you if things need to be discussed. Please reply to my message with any questions. Thank you!   You received your flu vaccination today.  If you have any questions or concerns, please don't hesitate to send me a message via MyChart or call the office at [phone]. Thank you for visiting with [place] today! It's our pleasure caring for you.

## 2023-09-07 NOTE — Progress Notes (Signed)
Please call patient: Diabetic control is fair. Continue eating a diabetic diet. No medication changes are recommended for this.  However, we need to lower his levothyroxine dose to daily (down from daily). Please schedule a lab visit in 8 weeks to ensure this is the correct dose. Cholesterol levels are great.  Thanks. Please order new does of levothyroxine daily #90 w 3rf. Thanks.

## 2023-09-08 ENCOUNTER — Other Ambulatory Visit: Payer: Self-pay

## 2023-09-08 DIAGNOSIS — E039 Hypothyroidism, unspecified: Secondary | ICD-10-CM

## 2023-09-08 MED ORDER — LEVOTHYROXINE SODIUM 100 MCG PO TABS: 100.0000 ug | ORAL_TABLET | Freq: Every day | ORAL | 1 refills | Status: DC

## 2023-09-09 ENCOUNTER — Encounter: Payer: Self-pay | Admitting: Family Medicine

## 2023-09-10 ENCOUNTER — Other Ambulatory Visit: Payer: Self-pay

## 2023-10-09 DIAGNOSIS — E785 Hyperlipidemia, unspecified: Secondary | ICD-10-CM | POA: Insufficient documentation

## 2023-10-09 NOTE — Progress Notes (Signed)
  Cardiology Office Note:   Date:  10/10/2023  ID:  Adrian Gamble, DOB 02-29-1946, MRN [id number] PCP: Willow Ora, MD  [facility] Providers Cardiologist:  Rollene Rotunda, MD {  History of Present Illness:   Adrian Gamble is a 77 y.o. male who presents for follow up of CAD.  He is status post remote PCI to his RCA in 2001, HL, DM2, hypothyroidism, GERD. Cardiac cath in  06/01/12 and demonstrated 3 vessel CAD with preserved LV function. CABG was recommended. He was taken off of Plavix and discharged to home and brought back on 06/09/12. He underwent CABG with Dr. Tyrone Sage with grafts including LIMA-LAD, SVG-OM1 and distal circumflex, SVG-distal RCA. Cath in 2022 demonstrated patent grafts.    Since he was last seen he has had no new cardiovascular problems.  He has had more knee problems.  He is no longer mowing his lawn.  He is grandchildren are doing that.The patient denies any new symptoms such as chest discomfort, neck or arm discomfort. There has been no new shortness of breath, PND or orthopnea. There have been no reported palpitations, presyncope or syncope.     ROS: As stated in the HPI and negative for all other systems.  Studies Reviewed:    EKG:   EKG Interpretation Date/Time:  Friday October 10 2023 11:42:49 EST Ventricular Rate:  66 PR Interval:  182 QRS Duration:  78 QT Interval:  404 QTC Calculation: 423 R Axis:   21  Text Interpretation: Normal sinus rhythm Minimal voltage criteria for LVH, may be normal variant ( R in aVL ) Possible Anteroseptal infarct , age undetermined When compared with ECG of 10-Jun-2012 07:58, Borderline criteria for Anteroseptal infarct are now Present ST no longer elevated in Anterolateral leads Nonspecific T wave abnormality now evident in Anterior leads Confirmed by Rollene Rotunda ([id number]) on 10/10/2023 12:26:34 PM     Risk Assessment/Calculations:          Physical Exam:   VS:  BP (!) 110/58 (BP Location: Left Arm, Patient Position:  Sitting, Cuff Size: Normal)   Pulse 61   Ht 5\' 8"  (1.727 m)   Wt 189 lb 3.2 oz (85.8 kg)   SpO2 97%   BMI 28.77 kg/m    Wt Readings from Last 3 Encounters:  10/10/23 189 lb 3.2 oz (85.8 kg)  08/29/23 190 lb 6.4 oz (86.4 kg)  08/28/23 187 lb (84.8 kg)     GEN: Well nourished, well developed in no acute distress NECK: No JVD; No carotid bruits CARDIAC: RRR, brief right upper sternal border systolic murmur, no diastolic murmurs, rubs, gallops RESPIRATORY:  Clear to auscultation without rales, wheezing or rhonchi  ABDOMEN: Soft, non-tender, non-distended EXTREMITIES:  No edema; No deformity   ASSESSMENT AND PLAN:   CAD :  The patient has no new sypmtoms.  No further cardiovascular testing is indicated.  We will continue with aggressive risk reduction and meds as listed.  DM:  A1C was 7.1 which is up previous but he is working on this with his primary provider.   hyperlipidemia :    LDL was 38.  No change in therapy.    HTN: The blood pressure is at target.  No change in therapy.     Follow up with me in 1 year.  Signed, Rollene Rotunda, MD

## 2023-10-10 ENCOUNTER — Encounter: Payer: Self-pay | Admitting: Cardiology

## 2023-10-10 ENCOUNTER — Ambulatory Visit: Payer: Medicare HMO | Attending: Cardiology | Admitting: Cardiology

## 2023-10-10 VITALS — BP 110/58 | HR 61 | Ht 68.0 in | Wt 189.2 lb

## 2023-10-10 DIAGNOSIS — I10 Essential (primary) hypertension: Secondary | ICD-10-CM | POA: Diagnosis not present

## 2023-10-10 DIAGNOSIS — E1159 Type 2 diabetes mellitus with other circulatory complications: Secondary | ICD-10-CM | POA: Diagnosis not present

## 2023-10-10 DIAGNOSIS — I25118 Atherosclerotic heart disease of native coronary artery with other forms of angina pectoris: Secondary | ICD-10-CM | POA: Diagnosis not present

## 2023-10-10 DIAGNOSIS — E785 Hyperlipidemia, unspecified: Secondary | ICD-10-CM

## 2023-10-10 NOTE — Patient Instructions (Signed)

## 2023-11-03 ENCOUNTER — Other Ambulatory Visit: Payer: Self-pay | Admitting: Family Medicine

## 2023-11-04 ENCOUNTER — Other Ambulatory Visit (INDEPENDENT_AMBULATORY_CARE_PROVIDER_SITE_OTHER): Payer: Medicare HMO

## 2023-11-04 ENCOUNTER — Other Ambulatory Visit: Payer: Self-pay | Admitting: Family Medicine

## 2023-11-04 DIAGNOSIS — E039 Hypothyroidism, unspecified: Secondary | ICD-10-CM | POA: Diagnosis not present

## 2023-11-04 LAB — TSH: TSH: 1.83 u[IU]/mL (ref 0.35–5.50)

## 2023-11-11 NOTE — Progress Notes (Signed)
Please call patient:thyroid function is now at goal again. Please continue levothyroxine daily.

## 2023-11-28 ENCOUNTER — Other Ambulatory Visit: Payer: Self-pay | Admitting: Cardiology

## 2023-11-28 DIAGNOSIS — I2510 Atherosclerotic heart disease of native coronary artery without angina pectoris: Secondary | ICD-10-CM

## 2023-12-04 DIAGNOSIS — D044 Carcinoma in situ of skin of scalp and neck: Secondary | ICD-10-CM | POA: Diagnosis not present

## 2023-12-04 DIAGNOSIS — D485 Neoplasm of uncertain behavior of skin: Secondary | ICD-10-CM | POA: Diagnosis not present

## 2023-12-08 DIAGNOSIS — H52223 Regular astigmatism, bilateral: Secondary | ICD-10-CM | POA: Diagnosis not present

## 2023-12-31 DIAGNOSIS — D044 Carcinoma in situ of skin of scalp and neck: Secondary | ICD-10-CM | POA: Diagnosis not present

## 2024-01-01 DIAGNOSIS — H53483 Generalized contraction of visual field, bilateral: Secondary | ICD-10-CM | POA: Diagnosis not present

## 2024-01-15 DIAGNOSIS — H53483 Generalized contraction of visual field, bilateral: Secondary | ICD-10-CM | POA: Diagnosis not present

## 2024-02-05 DIAGNOSIS — H47093 Other disorders of optic nerve, not elsewhere classified, bilateral: Secondary | ICD-10-CM | POA: Diagnosis not present

## 2024-02-05 DIAGNOSIS — H547 Unspecified visual loss: Secondary | ICD-10-CM | POA: Diagnosis not present

## 2024-02-05 DIAGNOSIS — H53453 Other localized visual field defect, bilateral: Secondary | ICD-10-CM | POA: Diagnosis not present

## 2024-02-06 ENCOUNTER — Other Ambulatory Visit (HOSPITAL_BASED_OUTPATIENT_CLINIC_OR_DEPARTMENT_OTHER): Payer: Self-pay | Admitting: Neuro-Ophthalmology

## 2024-02-06 DIAGNOSIS — H53453 Other localized visual field defect, bilateral: Secondary | ICD-10-CM

## 2024-02-09 ENCOUNTER — Other Ambulatory Visit: Payer: Self-pay | Admitting: Family Medicine

## 2024-02-09 DIAGNOSIS — E1159 Type 2 diabetes mellitus with other circulatory complications: Secondary | ICD-10-CM

## 2024-02-13 ENCOUNTER — Ambulatory Visit (HOSPITAL_BASED_OUTPATIENT_CLINIC_OR_DEPARTMENT_OTHER)

## 2024-02-24 DIAGNOSIS — H547 Unspecified visual loss: Secondary | ICD-10-CM | POA: Diagnosis not present

## 2024-02-24 DIAGNOSIS — H53453 Other localized visual field defect, bilateral: Secondary | ICD-10-CM | POA: Diagnosis not present

## 2024-02-24 DIAGNOSIS — H47093 Other disorders of optic nerve, not elsewhere classified, bilateral: Secondary | ICD-10-CM | POA: Diagnosis not present

## 2024-02-26 ENCOUNTER — Encounter: Payer: Self-pay | Admitting: Family Medicine

## 2024-02-26 ENCOUNTER — Ambulatory Visit (INDEPENDENT_AMBULATORY_CARE_PROVIDER_SITE_OTHER): Payer: Medicare HMO | Admitting: Family Medicine

## 2024-02-26 VITALS — BP 128/78 | HR 59 | Temp 97.8°F | Ht 68.0 in | Wt 190.6 lb

## 2024-02-26 DIAGNOSIS — H53453 Other localized visual field defect, bilateral: Secondary | ICD-10-CM | POA: Diagnosis not present

## 2024-02-26 DIAGNOSIS — Z7984 Long term (current) use of oral hypoglycemic drugs: Secondary | ICD-10-CM

## 2024-02-26 DIAGNOSIS — E039 Hypothyroidism, unspecified: Secondary | ICD-10-CM

## 2024-02-26 DIAGNOSIS — I25118 Atherosclerotic heart disease of native coronary artery with other forms of angina pectoris: Secondary | ICD-10-CM

## 2024-02-26 DIAGNOSIS — I152 Hypertension secondary to endocrine disorders: Secondary | ICD-10-CM | POA: Diagnosis not present

## 2024-02-26 DIAGNOSIS — E1159 Type 2 diabetes mellitus with other circulatory complications: Secondary | ICD-10-CM

## 2024-02-26 DIAGNOSIS — E119 Type 2 diabetes mellitus without complications: Secondary | ICD-10-CM

## 2024-02-26 DIAGNOSIS — E785 Hyperlipidemia, unspecified: Secondary | ICD-10-CM

## 2024-02-26 DIAGNOSIS — E1169 Type 2 diabetes mellitus with other specified complication: Secondary | ICD-10-CM

## 2024-02-26 LAB — POCT GLYCOSYLATED HEMOGLOBIN (HGB A1C): Hemoglobin A1C: 6.8 % — AB (ref 4.0–5.6)

## 2024-02-26 LAB — MICROALBUMIN / CREATININE URINE RATIO
Creatinine,U: 86.4 mg/dL
Microalb Creat Ratio: 199.8 mg/g — ABNORMAL HIGH (ref 0.0–30.0)
Microalb, Ur: 17.3 mg/dL — ABNORMAL HIGH (ref 0.0–1.9)

## 2024-02-26 MED ORDER — ROSUVASTATIN CALCIUM 40 MG PO TABS: 40.0000 mg | ORAL_TABLET | Freq: Every evening | ORAL | 3 refills | Status: AC

## 2024-02-26 NOTE — Patient Instructions (Signed)
 Please return in 6 months for your annual complete physical; please come fasting.    If you have any questions or concerns, please don't hesitate to send me a message via MyChart or call the office at [phone]. Thank you for visiting with Adrian Gamble today! It's our pleasure caring for you.   VISIT SUMMARY:  Today, we discussed your concerns about blood sugar levels and vision changes. We reviewed your recent tests and planned further evaluations. We also addressed your diabetes management, blood pressure, and general health maintenance.  YOUR PLAN:  -PERIPHERAL VISION LOSS: Peripheral vision loss can be due to issues with the optic nerve or brain. We will proceed with the scheduled brain MRI to determine the cause of your vision changes. In the meantime, continue to rely on your family for transportation.  -DIABETES MELLITUS TYPE 2: Type 2 diabetes is a condition where your body does not use insulin properly, leading to high blood sugar levels. Your diabetes control is improving with an HbA1c of 6.8%. Continue with your current diabetes management plan to maintain control of your blood sugar levels.  -HYPERTENSION: Hypertension, or high blood pressure, can lead to serious health issues if not managed. Your blood pressure was normal today, but it is important to monitor it regularly at home. Please start checking your blood pressure at home and keep a log of the readings.  -GENERAL HEALTH MAINTENANCE: Your cholesterol medication has been refilled. Continue taking it as prescribed. Maintain a healthy lifestyle with a balanced diet and regular exercise.  INSTRUCTIONS:  Please proceed with the scheduled brain MRI to evaluate your vision changes. Start monitoring your blood pressure at home and keep a log of the readings. Schedule a follow-up appointment in six months for a routine physical examination.

## 2024-02-26 NOTE — Progress Notes (Signed)
 Subjective  CC:  Chief Complaint  Patient presents with   Hypertension    Pt here for 6 month f/u    Diabetes    HPI: Adrian Gamble is a 78 y.o. male who presents to the office today for follow up of diabetes and problems listed above in the chief complaint.  Discussed the use of AI scribe software for clinical note transcription with the patient, who gave verbal consent to proceed.  History of Present Illness   Adrian Gamble is a 78 year old male with diabetes who presents with concerns about blood sugar levels and vision changes. He relies on his family, including four grandsons, two granddaughter-in-laws, and a daughter, for transportation due to his vision issues. He was referred by Dr. Lorin Picket for evaluation of vision changes.  He is experiencing significant vision changes, particularly a loss of focus in his peripheral vision. This issue was initially evaluated by Dr. Lorin Picket, who conducted several tests and subsequently referred him for further evaluation. He underwent extensive testing two days ago, focusing on the optic nerve and potential brain involvement. He is awaiting the results of these tests and is scheduled for a brain MRI. The vision changes have impacted his ability to drive, limiting him to short distances and relying on family for transportation.  I reviewed ophthalmology notes and lab reports.  He has concerns about his blood sugar levels, noting that they have been more difficult to control over the past two months. Morning blood sugar readings are seldom below 120 mg/dL and often range between 150 to 160 mg/dL. Occasionally, his blood sugar spikes unexpectedly, such as a reading of 300 mg/dL after expecting it to be around 140 mg/dL. No symptoms of hyperglycemia such as increased thirst, dry mouth, or increased urination. He takes metformin XR 500 daily.  Has history of hypoglycemia Glucotrol XL and recurrent balanitis with SGLT2 inhibitors.  He reports occasional high blood  pressure readings but has not been monitoring it regularly at home. He recalls a period 20 years ago when his blood pressure was monitored at work for a month due to elevated readings.     Lipid management is excellent on statin and Zetia.  Tolerating well. Wt Readings from Last 3 Encounters:  02/26/24 190 lb 9.6 oz (86.5 kg)  10/10/23 189 lb 3.2 oz (85.8 kg)  08/29/23 190 lb 6.4 oz (86.4 kg)    BP Readings from Last 3 Encounters:  02/26/24 128/78  10/10/23 (!) 110/58  08/29/23 116/60    Assessment  1. Type 2 diabetes mellitus with vascular disease (HCC)   2. Hypertension associated with diabetes (HCC)   3. Hyperlipidemia associated with type 2 diabetes mellitus (HCC)   4. Coronary artery disease of native artery of native heart with stable angina pectoris (HCC)   5. Hypothyroidism (acquired)   6. Diabetes mellitus treated with oral medication (HCC)   7. Peripheral vision loss, bilateral      Plan  Assessment and Plan    Peripheral Vision Loss Possible optic nerve involvement. Driving limited, relies on family for transportation.  Following with ophthalmology specialist - Proceed with scheduled brain MRI to evaluate for potential central causes of vision loss.  Diabetes Mellitus Type 2 Diabetes control improving, HbA1c 6.8%. Occasional elevated blood glucose readings, no significant hyperglycemia symptoms. - Continue current diabetes management plan.  Metformin XR 500 daily.  Continue home monitoring.  Consider Januvia in the future if A1c elevates.  Diabetic diet recommended Repeat urine nephropathy screen today.  Hypertension Blood pressure normalized upon recheck. Does not monitor at home. - Encourage regular home blood pressure monitoring.  Refilled statin for hyperlipidemia which is well-controlled.  Continue Zetia.  CAD is stable by symptoms.  Follow-up with cardiology. General Health Maintenance Cholesterol medication refilled. - Refill cholesterol  medication.  Follow-up Return for routine follow-up unless new issues arise. - Schedule follow-up appointment in six months for physical examination.      Orders Placed This Encounter  Procedures   Microalbumin / creatinine urine ratio   POCT HgB A1C   Meds ordered this encounter  Medications   rosuvastatin (CRESTOR) 40 MG tablet    Sig: Take 1 tablet (40 mg total) by mouth at bedtime.    Dispense:  90 tablet    Refill:  3      Immunization History  Administered Date(s) Administered   Fluad Quad(high Dose 65+) 09/16/2019, 09/05/2020, 09/13/2021, 10/30/2022   Fluad Trivalent(High Dose 65+) 08/29/2023   Influenza Split 11/01/2011, 09/22/2012   Influenza Whole 09/22/2009, 09/20/2010   Influenza, High Dose Seasonal PF 10/03/2015, 11/11/2016, 10/13/2017, 11/20/2018   Influenza,inj,Quad PF,6+ Mos 09/16/2013, 09/29/2014   PFIZER(Purple Top)SARS-COV-2 Vaccination 02/15/2020, 03/07/2020, 10/20/2020   Pneumococcal Conjugate-13 02/25/2014   Pneumococcal Polysaccharide-23 09/01/2007, 07/31/2011   Td 09/01/2007   Zoster Recombinant(Shingrix) 08/10/2020, 10/10/2020    Diabetes Related Lab Review: Lab Results  Component Value Date   HGBA1C 6.8 (A) 02/26/2024   HGBA1C 7.2 (H) 08/29/2023   HGBA1C 6.1 (A) 05/27/2023    Lab Results  Component Value Date   MICROALBUR 3.9 (H) 08/09/2022   Lab Results  Component Value Date   CREATININE 0.99 08/29/2023   BUN 15 08/29/2023   NA 141 08/29/2023   K 4.1 08/29/2023   CL 105 08/29/2023   CO2 28 08/29/2023   Lab Results  Component Value Date   CHOL 97 08/29/2023   CHOL 100 02/06/2023   CHOL 95 04/01/2022   Lab Results  Component Value Date   HDL 37.10 (L) 08/29/2023   HDL 35.50 (L) 02/06/2023   HDL 37.70 (L) 04/01/2022   Lab Results  Component Value Date   LDLCALC 38 08/29/2023   LDLCALC 40 02/06/2023   LDLCALC 38 04/01/2022   Lab Results  Component Value Date   TRIG 106.0 08/29/2023   TRIG 122.0 02/06/2023   TRIG 99.0  04/01/2022   Lab Results  Component Value Date   CHOLHDL 3 08/29/2023   CHOLHDL 3 02/06/2023   CHOLHDL 3 04/01/2022   No results found for: "LDLDIRECT" The ASCVD Risk score (Arnett DK, et al., 2019) failed to calculate for the following reasons:   Risk score cannot be calculated because patient has a medical history suggesting prior/existing ASCVD I have reviewed the PMH, Fam and Soc history. 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 due to recurrent severe balanitis  Hypothyroidism (acquired) 05/29/2009     Priority: High   Hyperlipidemia associated with type 2 diabetes mellitus (HCC) 09/01/2007     Priority: High   Coronary artery disease of native artery of native heart with stable angina pectoris (HCC)      Priority: High   Bruit 05/17/2021     Priority: Medium     Carotid dopplers 06/2022; bilateral minimal stenosis 1-39%    Bilateral primary osteoarthritis of knee 04/27/2020     Priority: Medium     xrays 04/2020, tricompartmental, worse medially    Diverticulosis 07/13/2019     Priority: Medium    GERD 09/01/2007     Priority: Medium    Peptic ulcer 09/01/2007     Priority: Medium    Former smoker 07/13/2019     Priority: Low   Testosterone deficiency 09/01/2007     Priority: Low   Dyslipidemia 10/09/2023     Social History: Patient  reports that he quit smoking about 11 years ago. His smoking use included pipe and cigars. He has never  used smokeless tobacco. He reports current alcohol use. He reports that he does not use drugs.  Review of Systems: Ophthalmic: negative for eye pain, loss of vision or double vision Cardiovascular: negative for chest pain Respiratory: negative for SOB or persistent cough Gastrointestinal: negative for abdominal pain Genitourinary: negative for dysuria or gross hematuria MSK: negative for foot lesions Neurologic: negative for weakness or gait disturbance  Objective  Vitals: BP 128/78 (BP Location: Right Arm, Patient Position: Supine, Cuff Size: Normal) Comment: cla  Pulse (!) 59   Temp 97.8 F (36.6 C)   Ht 5\' 8"  (1.727 m)   Wt 190 lb 9.6 oz (86.5 kg)   SpO2 98%   BMI 28.98 kg/m  General: well appearing, no acute distress  Psych:  Alert and oriented, normal mood and affect HEENT:  Normocephalic, atraumatic, moist mucous membranes, supple neck  Cardiovascular:  Nl S1 and S2, RRR without murmur, gallop or rub. no edema Respiratory:  Good breath sounds bilaterally, CTAB with normal effort, no rales Foot exam: no erythema, pallor, or cyanosis visible nl proprioception and sensation to monofilament testing bilaterally, +2 distal pulses bilaterally    Diabetic education: ongoing education regarding chronic disease management for diabetes was given today. We continue to reinforce the ABC's of diabetic management: A1c (<7 or 8 dependent upon patient), tight blood pressure control, and cholesterol management with goal LDL < 100 minimally. We discuss diet strategies, exercise recommendations, medication options and possible side effects. At each visit, we review recommended immunizations and preventive care recommendations for diabetics and stress that good diabetic control can prevent other problems. See below for this patient's data.   Commons side effects, risks, benefits, and alternatives for medications and treatment plan prescribed today were discussed, and the patient expressed  understanding of the given instructions. Patient is instructed to call or message via MyChart if he/she has any questions or concerns regarding our treatment plan. No barriers to understanding were identified. We discussed Red Flag symptoms and signs in detail. Patient expressed understanding regarding what to do in case of urgent or emergency type symptoms.  Medication list was reconciled, printed and provided to the patient in AVS. Patient instructions and summary information was reviewed with the patient as documented in the AVS. This note was prepared with assistance of Dragon voice recognition software. Occasional wrong-word or sound-a-like substitutions may have occurred due to the inherent limitations of voice recognition software

## 2024-03-01 ENCOUNTER — Encounter: Payer: Self-pay | Admitting: Family Medicine

## 2024-03-01 ENCOUNTER — Other Ambulatory Visit: Payer: Self-pay | Admitting: Family Medicine

## 2024-03-01 DIAGNOSIS — E1121 Type 2 diabetes mellitus with diabetic nephropathy: Secondary | ICD-10-CM | POA: Insufficient documentation

## 2024-03-01 NOTE — Progress Notes (Signed)
 Please call patient:urine testing shows spilling of protein in the urine so I recommend starting lisinopril 5mg  daily to help protect the kidneys. Please order lisinopril 5mg  daily. It will also help control his blood pressure.  Please schedule a lab visit in 2 weeks and order BMP to check his kidneys and potassium on the new medication. He will need to schedule a lab visit a well. Thanks .

## 2024-03-02 ENCOUNTER — Other Ambulatory Visit: Payer: Self-pay

## 2024-03-02 DIAGNOSIS — E1159 Type 2 diabetes mellitus with other circulatory complications: Secondary | ICD-10-CM

## 2024-03-02 DIAGNOSIS — R809 Proteinuria, unspecified: Secondary | ICD-10-CM

## 2024-03-04 ENCOUNTER — Other Ambulatory Visit: Payer: Self-pay

## 2024-03-04 DIAGNOSIS — R809 Proteinuria, unspecified: Secondary | ICD-10-CM

## 2024-03-04 DIAGNOSIS — E878 Other disorders of electrolyte and fluid balance, not elsewhere classified: Secondary | ICD-10-CM

## 2024-03-04 MED ORDER — LISINOPRIL 5 MG PO TABS: 5.0000 mg | ORAL_TABLET | Freq: Every day | ORAL | 3 refills | Status: AC

## 2024-03-22 ENCOUNTER — Other Ambulatory Visit (INDEPENDENT_AMBULATORY_CARE_PROVIDER_SITE_OTHER)

## 2024-03-22 ENCOUNTER — Encounter: Payer: Self-pay | Admitting: Family Medicine

## 2024-03-22 DIAGNOSIS — E878 Other disorders of electrolyte and fluid balance, not elsewhere classified: Secondary | ICD-10-CM | POA: Diagnosis not present

## 2024-03-22 DIAGNOSIS — R809 Proteinuria, unspecified: Secondary | ICD-10-CM | POA: Diagnosis not present

## 2024-03-22 LAB — BASIC METABOLIC PANEL WITH GFR
BUN: 21 mg/dL (ref 6–23)
CO2: 26 meq/L (ref 19–32)
Calcium: 9.8 mg/dL (ref 8.4–10.5)
Chloride: 104 meq/L (ref 96–112)
Creatinine, Ser: 1.08 mg/dL (ref 0.40–1.50)
GFR: 65.26 mL/min (ref >60.00)
Glucose, Bld: 132 mg/dL — ABNORMAL HIGH (ref 70–99)
Potassium: 4.2 meq/L (ref 3.5–5.1)
Sodium: 139 meq/L (ref 135–145)

## 2024-03-22 NOTE — Progress Notes (Signed)
 See mychart note Dear Mr. Streater, Thank you for returning to check your blood work on the new medication, lisinopril . Everything is stable.  Sincerely, Dr. Jonelle Neri

## 2024-03-29 DIAGNOSIS — H547 Unspecified visual loss: Secondary | ICD-10-CM | POA: Diagnosis not present

## 2024-03-29 DIAGNOSIS — H53453 Other localized visual field defect, bilateral: Secondary | ICD-10-CM | POA: Diagnosis not present

## 2024-03-29 DIAGNOSIS — H3500 Unspecified background retinopathy: Secondary | ICD-10-CM | POA: Diagnosis not present

## 2024-03-30 DIAGNOSIS — H3500 Unspecified background retinopathy: Secondary | ICD-10-CM | POA: Insufficient documentation

## 2024-04-01 DIAGNOSIS — G629 Polyneuropathy, unspecified: Secondary | ICD-10-CM | POA: Diagnosis not present

## 2024-04-01 DIAGNOSIS — H5340 Unspecified visual field defects: Secondary | ICD-10-CM | POA: Diagnosis not present

## 2024-04-01 DIAGNOSIS — H47093 Other disorders of optic nerve, not elsewhere classified, bilateral: Secondary | ICD-10-CM | POA: Diagnosis not present

## 2024-04-01 DIAGNOSIS — H547 Unspecified visual loss: Secondary | ICD-10-CM | POA: Diagnosis not present

## 2024-04-01 DIAGNOSIS — H53453 Other localized visual field defect, bilateral: Secondary | ICD-10-CM | POA: Diagnosis not present

## 2024-04-08 DIAGNOSIS — H53483 Generalized contraction of visual field, bilateral: Secondary | ICD-10-CM | POA: Diagnosis not present

## 2024-04-16 DIAGNOSIS — H3500 Unspecified background retinopathy: Secondary | ICD-10-CM | POA: Diagnosis not present

## 2024-04-16 DIAGNOSIS — Z129 Encounter for screening for malignant neoplasm, site unspecified: Secondary | ICD-10-CM | POA: Diagnosis not present

## 2024-04-16 DIAGNOSIS — R9089 Other abnormal findings on diagnostic imaging of central nervous system: Secondary | ICD-10-CM | POA: Diagnosis not present

## 2024-04-29 ENCOUNTER — Other Ambulatory Visit: Payer: Self-pay | Admitting: Family Medicine

## 2024-05-10 DIAGNOSIS — H547 Unspecified visual loss: Secondary | ICD-10-CM | POA: Diagnosis not present

## 2024-05-10 DIAGNOSIS — H3500 Unspecified background retinopathy: Secondary | ICD-10-CM | POA: Diagnosis not present

## 2024-05-26 ENCOUNTER — Other Ambulatory Visit: Payer: Self-pay | Admitting: Family Medicine

## 2024-06-07 DIAGNOSIS — C4442 Squamous cell carcinoma of skin of scalp and neck: Secondary | ICD-10-CM | POA: Diagnosis not present

## 2024-06-07 DIAGNOSIS — D485 Neoplasm of uncertain behavior of skin: Secondary | ICD-10-CM | POA: Diagnosis not present

## 2024-06-07 DIAGNOSIS — R234 Changes in skin texture: Secondary | ICD-10-CM | POA: Diagnosis not present

## 2024-06-07 DIAGNOSIS — L820 Inflamed seborrheic keratosis: Secondary | ICD-10-CM | POA: Diagnosis not present

## 2024-06-07 DIAGNOSIS — L814 Other melanin hyperpigmentation: Secondary | ICD-10-CM | POA: Diagnosis not present

## 2024-06-07 DIAGNOSIS — L570 Actinic keratosis: Secondary | ICD-10-CM | POA: Diagnosis not present

## 2024-06-07 DIAGNOSIS — L578 Other skin changes due to chronic exposure to nonionizing radiation: Secondary | ICD-10-CM | POA: Diagnosis not present

## 2024-06-07 DIAGNOSIS — L821 Other seborrheic keratosis: Secondary | ICD-10-CM | POA: Diagnosis not present

## 2024-06-07 DIAGNOSIS — H3500 Unspecified background retinopathy: Secondary | ICD-10-CM | POA: Diagnosis not present

## 2024-06-07 DIAGNOSIS — D1801 Hemangioma of skin and subcutaneous tissue: Secondary | ICD-10-CM | POA: Diagnosis not present

## 2024-06-07 DIAGNOSIS — D229 Melanocytic nevi, unspecified: Secondary | ICD-10-CM | POA: Diagnosis not present

## 2024-06-16 DIAGNOSIS — H547 Unspecified visual loss: Secondary | ICD-10-CM | POA: Diagnosis not present

## 2024-06-16 DIAGNOSIS — H53453 Other localized visual field defect, bilateral: Secondary | ICD-10-CM | POA: Diagnosis not present

## 2024-06-16 DIAGNOSIS — H3500 Unspecified background retinopathy: Secondary | ICD-10-CM | POA: Diagnosis not present

## 2024-06-16 DIAGNOSIS — C4442 Squamous cell carcinoma of skin of scalp and neck: Secondary | ICD-10-CM | POA: Insufficient documentation

## 2024-06-16 DIAGNOSIS — Z961 Presence of intraocular lens: Secondary | ICD-10-CM | POA: Diagnosis not present

## 2024-06-18 DIAGNOSIS — Z79899 Other long term (current) drug therapy: Secondary | ICD-10-CM | POA: Diagnosis not present

## 2024-06-18 DIAGNOSIS — Z1159 Encounter for screening for other viral diseases: Secondary | ICD-10-CM | POA: Diagnosis not present

## 2024-06-18 DIAGNOSIS — H3500 Unspecified background retinopathy: Secondary | ICD-10-CM | POA: Diagnosis not present

## 2024-06-18 DIAGNOSIS — H547 Unspecified visual loss: Secondary | ICD-10-CM | POA: Diagnosis not present

## 2024-07-29 ENCOUNTER — Other Ambulatory Visit: Payer: Self-pay | Admitting: Family Medicine

## 2024-08-16 DIAGNOSIS — H547 Unspecified visual loss: Secondary | ICD-10-CM | POA: Diagnosis not present

## 2024-08-16 DIAGNOSIS — H3500 Unspecified background retinopathy: Secondary | ICD-10-CM | POA: Diagnosis not present

## 2024-08-24 ENCOUNTER — Other Ambulatory Visit: Payer: Self-pay | Admitting: Family Medicine

## 2024-08-30 ENCOUNTER — Ambulatory Visit: Admitting: Family Medicine

## 2024-08-30 ENCOUNTER — Encounter: Payer: Self-pay | Admitting: Family Medicine

## 2024-08-30 VITALS — BP 132/74 | HR 65 | Temp 97.7°F | Ht 68.0 in | Wt 186.8 lb

## 2024-08-30 DIAGNOSIS — E119 Type 2 diabetes mellitus without complications: Secondary | ICD-10-CM

## 2024-08-30 DIAGNOSIS — E039 Hypothyroidism, unspecified: Secondary | ICD-10-CM | POA: Diagnosis not present

## 2024-08-30 DIAGNOSIS — I25118 Atherosclerotic heart disease of native coronary artery with other forms of angina pectoris: Secondary | ICD-10-CM

## 2024-08-30 DIAGNOSIS — E785 Hyperlipidemia, unspecified: Secondary | ICD-10-CM | POA: Diagnosis not present

## 2024-08-30 DIAGNOSIS — I10 Essential (primary) hypertension: Secondary | ICD-10-CM | POA: Diagnosis not present

## 2024-08-30 DIAGNOSIS — Z7984 Long term (current) use of oral hypoglycemic drugs: Secondary | ICD-10-CM | POA: Diagnosis not present

## 2024-08-30 DIAGNOSIS — M170 Bilateral primary osteoarthritis of knee: Secondary | ICD-10-CM

## 2024-08-30 DIAGNOSIS — Z0001 Encounter for general adult medical examination with abnormal findings: Secondary | ICD-10-CM

## 2024-08-30 DIAGNOSIS — Z23 Encounter for immunization: Secondary | ICD-10-CM | POA: Diagnosis not present

## 2024-08-30 DIAGNOSIS — E1169 Type 2 diabetes mellitus with other specified complication: Secondary | ICD-10-CM

## 2024-08-30 DIAGNOSIS — E1121 Type 2 diabetes mellitus with diabetic nephropathy: Secondary | ICD-10-CM

## 2024-08-30 DIAGNOSIS — H3500 Unspecified background retinopathy: Secondary | ICD-10-CM

## 2024-08-30 DIAGNOSIS — E1159 Type 2 diabetes mellitus with other circulatory complications: Secondary | ICD-10-CM

## 2024-08-30 LAB — CBC WITH DIFFERENTIAL/PLATELET
Basophils Absolute: 0 K/uL (ref 0.0–0.1)
Basophils Relative: 0.8 % (ref 0.0–3.0)
Eosinophils Absolute: 0.1 K/uL (ref 0.0–0.7)
Eosinophils Relative: 2 % (ref 0.0–5.0)
HCT: 38 % — ABNORMAL LOW (ref 39.0–52.0)
Hemoglobin: 12.8 g/dL — ABNORMAL LOW (ref 13.0–17.0)
Lymphocytes Relative: 22.7 % (ref 12.0–46.0)
Lymphs Abs: 1.1 K/uL (ref 0.7–4.0)
MCHC: 33.7 g/dL (ref 30.0–36.0)
MCV: 89.6 fl (ref 78.0–100.0)
Monocytes Absolute: 0.4 K/uL (ref 0.1–1.0)
Monocytes Relative: 9 % (ref 3.0–12.0)
Neutro Abs: 3.2 K/uL (ref 1.4–7.7)
Neutrophils Relative %: 65.5 % (ref 43.0–77.0)
Platelets: 67 K/uL — ABNORMAL LOW (ref 150.0–400.0)
RBC: 4.24 Mil/uL (ref 4.22–5.81)
RDW: 14.1 % (ref 11.5–15.5)
WBC: 4.9 K/uL (ref 4.0–10.5)

## 2024-08-30 LAB — COMPREHENSIVE METABOLIC PANEL WITH GFR
ALT: 11 U/L (ref 0–53)
AST: 16 U/L (ref 0–37)
Albumin: 4.2 g/dL (ref 3.5–5.2)
Alkaline Phosphatase: 43 U/L (ref 39–117)
BUN: 16 mg/dL (ref 6–23)
CO2: 26 meq/L (ref 19–32)
Calcium: 9.6 mg/dL (ref 8.4–10.5)
Chloride: 106 meq/L (ref 96–112)
Creatinine, Ser: 0.95 mg/dL (ref 0.40–1.50)
GFR: 75.88 mL/min (ref >60.00)
Glucose, Bld: 133 mg/dL — ABNORMAL HIGH (ref 70–99)
Potassium: 4 meq/L (ref 3.5–5.1)
Sodium: 141 meq/L (ref 135–145)
Total Bilirubin: 0.7 mg/dL (ref 0.2–1.2)
Total Protein: 6.9 g/dL (ref 6.0–8.3)

## 2024-08-30 LAB — LIPID PANEL
Cholesterol: 115 mg/dL (ref 0–200)
HDL: 43.4 mg/dL (ref >39.00)
LDL Cholesterol: 59 mg/dL (ref 0–99)
NonHDL: 71.18
Total CHOL/HDL Ratio: 3
Triglycerides: 63 mg/dL (ref 0.0–149.0)
VLDL: 12.6 mg/dL (ref 0.0–40.0)

## 2024-08-30 LAB — MICROALBUMIN / CREATININE URINE RATIO
Creatinine,U: 92.7 mg/dL
Microalb Creat Ratio: 145.8 mg/g — ABNORMAL HIGH (ref 0.0–30.0)
Microalb, Ur: 13.5 mg/dL — ABNORMAL HIGH (ref 0.0–1.9)

## 2024-08-30 LAB — HEMOGLOBIN A1C: Hgb A1c MFr Bld: 7.5 % — ABNORMAL HIGH (ref 4.6–6.5)

## 2024-08-30 LAB — TSH: TSH: 0.6 u[IU]/mL (ref 0.35–5.50)

## 2024-08-30 NOTE — Progress Notes (Signed)
 Subjective  Chief Complaint  Patient presents with   Annual Exam    Pt here for Annual Exam and is currently fasting    Diabetes    HPI: Adrian Gamble is a 78 y.o. male who presents to [facility] [place] today for a Male Wellness Visit. He also has the concerns and/or needs as listed above in the chief complaint. These will be addressed in addition to the Health Maintenance Visit.   Wellness Visit: annual visit with health maintenance review and exam   HM: screens are current. Flu shot eligible. Overall doing ok; struggling with vision loss. Mildly unsteady gait/knee OA  Body mass index is 28.4 kg/m. Wt Readings from Last 3 Encounters:  08/30/24 186 lb 12.8 oz (84.7 kg)  02/26/24 190 lb 9.6 oz (86.5 kg)  10/10/23 189 lb 3.2 oz (85.8 kg)   Chronic disease management visit and/or acute problem visit: Discussed the use of AI scribe software for clinical note transcription with the patient, who gave verbal consent to proceed.  History of Present Illness Adrian Gamble is a 78 year old male with vision problems who presents for follow-up on his eye condition and medication management.  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  I reviewed notes. - Central vision is 20/20 with worsening peripheral vision, predominantly on the left side. - Symptoms have persisted for seven months. - Genetic testing revealed a retinal genetic abnormality. - Previously started on a new medication regimen, discontinued after genetic findings indicated a potential problem. - Currently not taking any medication for the eye condition due to genetic results. - Uncertainty regarding etiology: possible autoimmune or genetic origin. - Family history of vision loss: grandfather became blind at age 62.  Hypertension/CAD - Blood pressure generally stable.  Lisinopril  5 mg daily and metoprolol  25 twice daily.  Also on Imdur  for CAD.  Very stable.  No chest pain. - No  recent home blood pressure monitoring. - Blood pressure occasionally checked by eye specialist.  He reports he always has good numbers at the specialist office - No chest pain, lower extremity edema or palpitations.  Also on aspirin .  Diabetes: Feels like it is well-controlled overall.  Diet is improved. - Blood glucose levels are unpredictable, with occasional low readings. - No more symptomatic hypoglycemic episodes. - Blood glucose fluctuations do not consistently correlate with dietary intake. - Currently taking metformin  for diabetes management.  Tolerates metformin  - No foot complaints. - Microalbuminuria due for recheck now on lisinopril  5.  Gait instability and musculoskeletal stiffness - Balance difficulties with tendency to take shorter steps to prevent falls. - Uses a walker for appointments when accompanied by his daughter, but does not use it at home. - Experiences stiffness after prolonged sitting or upon waking. - No noted tremor or cognitive dysfunction. - He has known bilateral knee osteoarthritis.  Chronic rhinorrhea, allergic rhinitis - Persistent runny nose, believed to be familial. - Limited response to Claritin. - Considering alternative antihistamines such as Zyrtec or Allegra.  Hyperlipidemia on Zetia  and rosuvastatin  due for recheck.  Hypothyroidism: Energy levels are stable.  Due for recheck.  Compliant with medications.   Assessment  1. Encounter for well adult exam with abnormal findings   2. Need for influenza vaccination   3. Coronary artery disease of native artery of native heart with stable angina pectoris   4. Hyperlipidemia associated with type 2 diabetes mellitus (HCC)   5. Hypertension associated with diabetes (HCC)   6. Hypothyroidism (acquired)  7. Microalbuminuric diabetic nephropathy (HCC)   8. Type 2 diabetes mellitus with vascular disease (HCC)   9. 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 Diabetes  mellitus treated with oral medication [facility])      Plan  Male Wellness Visit: Age appropriate Health Maintenance and Prevention measures were discussed with patient. Included topics are cancer screening recommendations, ways to keep healthy (see AVS) including dietary and exercise recommendations, regular eye and dental care, use of seat belts, and avoidance of moderate alcohol use and tobacco use.  BMI: discussed patient's BMI and encouraged positive lifestyle modifications to help get to or maintain a target BMI. HM needs and immunizations were addressed and ordered. See below for orders. See HM and immunization section for updates. Routine labs and screening tests ordered including cmp, cbc and lipids where appropriate. 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 Concern about supplement interaction with metformin . - Order A1c test with today's blood work. - Review blood work results via Clinical cytogeneticist. -Continue ACE inhibitor for microalbuminuria.  Recheck today.  Monitor kidney function.  Atherosclerotic heart disease of native coronary artery with angina No chest pain or angina symptoms. Heart condition well-managed.  Continue Imdur    Hypertension is controlled: Continue lisinopril  and metoprolol .  Check renal function  Recheck lipid panel and TSH today with history of hyperlipidemia on Zetia  and Crestor  and history of hypothyroidism.  Clinically euthyroid.  Gait instability: Likely multifactorial.  Discussed strengthening, balance training and consider use of cane.  Bilateral primary osteoarthritis of knee Retinal disease with genetic and autoimmune etiology, vision  loss Progressive vision loss with worsening peripheral vision. Genetic testing identified retinal gene. Ongoing specialist evaluation.  Allergic rhinitis Chronic rhinorrhea with limited relief from Claritin. Discussed alternative antihistamines. - Consider daily use of Allegra in the morning for better symptom control.   Follow up: 6 mo for dm/htn Commons side effects, risks, benefits, and alternatives for medications and treatment plan prescribed today were discussed, and the patient expressed understanding of the given instructions. Patient is instructed to call or message via MyChart if he/she has any questions or concerns regarding our treatment plan. No barriers to understanding were identified. We discussed Red Flag symptoms and signs in detail. Patient expressed understanding regarding what to do in case of urgent or emergency type symptoms.  Medication list was reconciled, printed and provided to the patient in AVS. Patient instructions and summary information was reviewed with the patient as documented in the AVS. This note was prepared with assistance of Dragon voice recognition software. Occasional wrong-word or sound-a-like substitutions may have occurred due to the inherent limitations of voice recognition software  Orders Placed This Encounter  Procedures   Flu vaccine HIGH DOSE PF(Fluzone Trivalent)   CBC with Differential/Platelet   Comprehensive metabolic panel with GFR   Lipid panel   Hemoglobin A1c   TSH   Microalbumin / creatinine urine ratio   No orders of the defined types were placed in this encounter.    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   Hypertension associated with diabetes (HCC)  10/05/2018   Type 2 diabetes mellitus with vascular disease (HCC) 08/12/2016   Hypothyroidism (acquired) 05/29/2009   Hyperlipidemia associated with type 2 diabetes mellitus (HCC) 09/01/2007   Coronary artery disease of native artery of native heart with stable angina pectoris    Bruit 05/17/2021   Bilateral primary osteoarthritis of knee 04/27/2020   Diverticulosis 07/13/2019   GERD 09/01/2007   Peptic ulcer 09/01/2007   Former smoker 07/13/2019   Testosterone  deficiency 09/01/2007   Health Maintenance  Topic Date Due   Medicare Annual Wellness (AWV)  08/27/2024   HEMOGLOBIN A1C  08/28/2024   COVID-19 Vaccine (4 - 2025-26 season) 09/15/2024 (Originally 08/02/2024)   OPHTHALMOLOGY EXAM  02/04/2025   Diabetic kidney evaluation - Urine ACR  02/25/2025   FOOT EXAM  02/25/2025   Diabetic kidney evaluation - eGFR measurement  03/22/2025   Pneumococcal Vaccine: 50+ Years  Completed   Influenza Vaccine  Completed   Hepatitis C Screening  Completed   Zoster Vaccines- Shingrix   Completed   HPV VACCINES  Aged Out   Meningococcal B Vaccine  Aged Out   DTaP/Tdap/Td  Discontinued   Colonoscopy  Discontinued   Immunization History  Administered Date(s) Administered   Fluad Quad(high Dose 65+) 09/16/2019, 09/05/2020, 09/13/2021, 10/30/2022   Fluad Trivalent(High Dose 65+) 08/29/2023   INFLUENZA, HIGH DOSE SEASONAL PF 10/03/2015, 11/11/2016, 10/13/2017, 11/20/2018, 08/30/2024   Influenza Split 11/01/2011, 09/22/2012   Influenza Whole 09/22/2009, 09/20/2010   Influenza,inj,Quad PF,6+ Mos 09/16/2013, 09/29/2014   PFIZER(Purple Top)SARS-COV-2 Vaccination 02/15/2020, 03/07/2020, 10/20/2020   Pneumococcal Conjugate-13 02/25/2014   Pneumococcal Polysaccharide-23 09/01/2007, 07/31/2011   Td 09/01/2007   Zoster Recombinant(Shingrix ) 08/10/2020, 10/10/2020   We updated and reviewed the patient's past history in detail and it is documented below. Allergies: Patient is allergic to farxiga   [dapagliflozin ] and oxycontin  [oxycodone ]. Past Medical History  has a past medical history of Bilateral primary osteoarthritis of knee (04/27/2020), CAD (coronary artery disease), s/p MI - s/p PCI to RCA 2001;  LHC 7/13: 3v CAD, good LVF - >  s/p CABG 7/13 (L-LAD, S-OM1/dCFX, S-dRCA), Diabetes mellitus type 2, controlled (HCC), Diverticulosis, Early cataracts, bilateral, GERD (09/01/2007), H/O hiatal hernia, HLD (hyperlipidemia), on statin, Hypothyroidism. 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 Past Surgical History Patient  has a past surgical history that includes Cardiac catheterization (2005; 06/01/2012); Tonsillectomy and adenoidectomy (1955); arm surgery (8042); Colonoscopy (2005); Esophagogastroduodenoscopy; Coronary angioplasty with stent (2001); Coronary artery bypass graft (06/09/2012); percutaneous coronary stent intervention (pci-s) (N/A, 06/01/2012); and LEFT HEART CATH AND CORONARY ANGIOGRAPHY (N/A, 05/03/2021). Social History Patient  reports that he quit smoking about 12 years ago. His smoking use included pipe and cigars. He has never used smokeless tobacco. He reports current alcohol use. He reports that he does not use drugs. Family History family history includes Coronary artery disease (age of onset: 13) in his father; Memory loss in his father. Review of Systems: Constitutional: negative for fever or malaise Ophthalmic: negative for photophobia, double vision or loss of vision Cardiovascular: negative for chest pain, dyspnea on exertion, or new LE swelling Respiratory: negative for SOB or persistent cough Gastrointestinal: negative for abdominal pain, change in bowel habits or melena Genitourinary: negative for dysuria or gross hematuria Musculoskeletal: negative for new gait disturbance or muscular weakness Integumentary: negative for new or persistent  rashes Neurological: negative for  TIA or stroke symptoms Psychiatric: negative for SI or delusions Allergic/Immunologic: negative for hives  Patient Care Team    Relationship Specialty Notifications Start End  Jodie Lavern CROME, MD PCP - General Family Medicine  09/16/19   Lavona Agent, MD PCP - Cardiology Cardiology Admissions 11/05/19   Army Dallas NOVAK, MD (Inactive) Consulting Physician Cardiothoracic Surgery  06/02/12   Shona Rush, MD  Dermatology  09/16/19   Lavona Agent, MD Consulting Physician Cardiology  09/16/19   Dohmeier, Dedra, MD Consulting Physician Neurology  04/26/20   Nicholaus Sherlean CROME, [facility] (Inactive)  Pharmacist  12/25/21    Comment: [phone]  [facility] Consulting Physician Dermatology  08/29/23    Objective  Vitals: BP 132/74 (Patient Position: Supine, Cuff Size: Normal) Comment: by me/cla  Pulse 65   Temp 97.7 F (36.5 C)   Ht 5' 8 (1.727 m)   Wt 186 lb 12.8 oz (84.7 kg)   SpO2 98%   BMI 28.40 kg/m  General:  Well developed, well nourished, no acute distress  Psych:  Alert and orientedx3,normal mood and affect HEENT:  Normocephalic, atraumatic, non-icteric sclera,  oropharynx is clear without mass or exudate, supple neck without adenopathy, or thyromegaly Cardiovascular:  Normal S1, S2, RRR without gallop, rub or murmur,  Respiratory:  Good breath sounds bilaterally, CTAB with normal respiratory effort Gastrointestinal: normal bowel sounds, soft, non-tender, no noted masses. No HSM MSK: Joints are without erythema or swelling.  Skin:  Warm, no rashes Neurologic:    Mental status is normal.  Gross motor and sensory exams are normal. Stable gait . No tremor

## 2024-08-30 NOTE — Patient Instructions (Signed)
 Please return in 6 months to recheck diabetes and blood pressure   I will release your lab results to you on your MyChart account with further instructions. You may see the results before I do, but when I review them I will send you a message with my report or have my assistant call you if things need to be discussed. Please reply to my message with any questions. Thank you!   If you have any questions or concerns, please don't hesitate to send me a message via MyChart or call the office at [phone]. Thank you for visiting with us  today! It's our pleasure caring for you.    VISIT SUMMARY: During today's visit, we discussed your ongoing health concerns, including your vision problems, hypertension, diabetes management, balance issues, and chronic runny nose. We reviewed your current medications and made some adjustments to better manage your conditions. Blood work and a urine test were ordered to monitor your overall health, and a follow-up appointment was scheduled for six months from now.  YOUR PLAN: -TYPE 2 DIABETES MELLITUS WITH NEPHROPATHY AND OTHER COMPLICATIONS: Type 2 diabetes is a condition where your body does not use insulin  properly, leading to high blood sugar levels. We will check your A1c levels with today's blood work to monitor your diabetes control. Please avoid any supplements that may interfere with your metformin .  -HYPERTENSION: Hypertension, or high blood pressure, can increase your risk of heart disease and stroke. Your blood pressure was slightly elevated today, so we will continue to monitor it closely. Please check your blood pressure regularly at home and report any significant changes.  -RETINAL DISEASE WITH GENETIC AND AUTOIMMUNE ETIOLOGY, VISION LOSS: Your vision loss is due to a genetic and possibly autoimmune condition affecting your retina. We will continue to work with your eye specialist to manage this condition. No new medications were started due to potential  genetic issues.  -BILATERAL PRIMARY OSTEOARTHRITIS OF KNEE: Osteoarthritis is a condition that causes joint pain and stiffness. Your knee pain and balance issues are being monitored. Please continue using your walker as needed to prevent falls.  -ALLERGIC RHINITIS: Allergic rhinitis is an allergic reaction that causes a runny nose. Since Claritin has not been effective, you may try taking Allegra daily in the morning for better symptom control.  INSTRUCTIONS: Please complete the blood work and urine test ordered today. We will review the results via MyChart. Schedule a follow-up appointment in six months to reassess your health and make any necessary adjustments to your treatment plan.                      Contains text generated by [employer]bridge.                                 Contains text generated by [employer]bridge.

## 2024-08-31 ENCOUNTER — Ambulatory Visit (INDEPENDENT_AMBULATORY_CARE_PROVIDER_SITE_OTHER): Payer: Medicare HMO

## 2024-08-31 VITALS — Ht 68.0 in | Wt 186.0 lb

## 2024-08-31 DIAGNOSIS — Z0000 Encounter for general adult medical examination without abnormal findings: Secondary | ICD-10-CM | POA: Diagnosis not present

## 2024-08-31 NOTE — Progress Notes (Signed)
 Subjective:   Adrian Gamble is a 78 y.o. who presents for a Medicare Wellness preventive visit.  As a reminder, Annual Wellness Visits don't include a physical exam, and some assessments may be limited, especially if this visit is performed virtually. We may recommend an in-person follow-up visit with your provider if needed.  Visit Complete: Virtual I connected with  Adrian Gamble on 08/31/24 by a video and audio enabled telemedicine application and verified that I am speaking with the correct person using two identifiers.  Patient Location: Home  Provider Location: Office/Clinic  I discussed the limitations of evaluation and management by telemedicine. The patient expressed understanding and agreed to proceed.  Vital Signs: Because this visit was a virtual/telehealth visit, some criteria may be missing or patient reported. Any vitals not documented were not able to be obtained and vitals that have been documented are patient reported.    Persons Participating in Visit: Patient.  AWV Questionnaire: No: Patient Medicare AWV questionnaire was not completed prior to this visit.  Cardiac Risk Factors include: advanced age (>49men, >79 women);dyslipidemia;diabetes mellitus;hypertension;male gender     Objective:    Today's Vitals   08/31/24 1035  Weight: 186 lb (84.4 kg)  Height: 5' 8 (1.727 m)   Body mass index is 28.28 kg/m.     08/31/2024   10:40 AM 08/28/2023   10:22 AM 08/22/2022   10:06 AM 09/28/2021   10:20 AM 05/03/2021    9:20 AM 09/04/2020   11:19 AM 08/12/2019   11:52 AM  Advanced Directives  Does Patient Have a Medical Advance Directive? Yes Yes Yes Yes Yes Yes No  Type of Estate agent of [place];Living will Healthcare Power of [place];Living will Healthcare Power of [place];Living will Healthcare Power of [employer] of [place];Living will Healthcare Power of [place];Living will   Does patient want to make changes to medical  advance directive?     No - Patient declined    Copy of Healthcare Power of Attorney in Chart? No - copy requested No - copy requested No - copy requested No - copy requested No - copy requested No - copy requested   Would patient like information on creating a medical advance directive?       No - Patient declined    Current Medications (verified) Outpatient Encounter Medications as of 08/31/2024  Medication Sig   aspirin  EC 81 MG tablet Take 81 mg by mouth daily.   Blood Glucose Monitoring Suppl (ONETOUCH VERIO) w/Device KIT Use kit as directed.   brimonidine (ALPHAGAN P) 0.1 % SOLN    ezetimibe  (ZETIA ) 10 MG tablet Take 1 tablet by mouth once daily   glucose blood test strip Use as instructed   isosorbide  mononitrate (IMDUR ) 60 MG 24 hr tablet Take 1 tablet by mouth once daily   levothyroxine  (SYNTHROID ) 100 MCG tablet Take 1 tablet by mouth once daily   lisinopril  (ZESTRIL ) 5 MG tablet Take 1 tablet (5 mg total) by mouth daily.   Melatonin ER 5 MG TBCR Take 5 mg by mouth at bedtime.   metFORMIN  (GLUCOPHAGE -XR) 500 MG 24 hr tablet TAKE 1 TABLET BY MOUTH TWICE DAILY WITH A MEAL   metoprolol  tartrate (LOPRESSOR ) 25 MG tablet Take 1 tablet by mouth twice daily   nitroGLYCERIN  (NITROSTAT ) 0.4 MG SL tablet Place 1 tablet (0.4 mg total) under the tongue every 5 (five) minutes as needed.   OneTouch Delica Lancets 33G MISC Check blood sugar up to 3 times daily.  ONETOUCH VERIO test strip USE 1 STRIP TO CHECK GLUCOSE UP TO THREE TIMES DAILY IN THE MORNING ,  AT  NOON,  AND  BEDTIME.   rosuvastatin  (CRESTOR ) 40 MG tablet Take 1 tablet (40 mg total) by mouth at bedtime.   vitamin B-12 (CYANOCOBALAMIN ) 100 MCG tablet Take 100 mcg by mouth daily.   No facility-administered encounter medications on file as of 08/31/2024.    Allergies (verified) [name]  [dapagliflozin ] and Oxycontin  [oxycodone ]   History: Past Medical History:  Diagnosis Date   Bilateral primary osteoarthritis of knee 04/27/2020    xrays 04/2020, tricompartmental, worse medially   CAD (coronary artery disease), s/p MI - s/p PCI to RCA 2001;  LHC 7/13: 3v CAD, good LVF - >  s/p CABG 7/13 (L-LAD, S-OM1/dCFX, S-dRCA)    Diabetes mellitus type 2, controlled (HCC)    Diverticulosis    Early cataracts, bilateral    GERD 09/01/2007   H/O hiatal hernia    HLD (hyperlipidemia), on statin    Hypothyroidism. 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   REM disorder   Testosterone  deficiency    Past Surgical History:  Procedure Laterality Date   arm surgery  1957   d/t broken   CARDIAC CATHETERIZATION  2005; 06/01/2012   COLONOSCOPY  2005   CORONARY ANGIOPLASTY WITH STENT PLACEMENT  2001   RCA-1 stent   CORONARY ARTERY BYPASS GRAFT  06/09/2012   Procedure: CORONARY ARTERY BYPASS GRAFTING (CABG);  Surgeon: Dallas KATHEE Jude, MD;  Location: [facility] OR;  Service: Open Heart Surgery;  Laterality: N/A;  CABG x four;  using left internal mammary artery and right leg greater saphenous vein harvested endoscopically   ESOPHAGOGASTRODUODENOSCOPY     LEFT HEART CATH AND CORONARY ANGIOGRAPHY N/A 05/03/2021   Procedure: LEFT HEART CATH AND CORONARY ANGIOGRAPHY;  Surgeon: [place]az[place], Peter M, MD;  Location: [facility] INVASIVE CV LAB;  Service: Cardiovascular;  Laterality: N/A;   PERCUTANEOUS CORONARY STENT INTERVENTION (PCI-S) N/A 06/01/2012   Procedure: PERCUTANEOUS CORONARY STENT INTERVENTION (PCI-S);  Surgeon: Lonni JONETTA Cash, MD;  Location: [facility] CATH LAB;  Service: Cardiovascular;  Laterality: N/A;   TONSILLECTOMY AND ADENOIDECTOMY  1955   at age 43   Family History  Problem Relation Age of Onset   Memory loss Father    Coronary artery disease Father 26       cabg   Colon cancer Neg Hx    Social History   Socioeconomic History   Marital status: Married    Spouse name: Not on file   Number of children: Not on file   Years of  education: Not on file   Highest education level: Not on file  Occupational History   Occupation: Retired  Tobacco Use   Smoking status: Former    Types: Pipe, Cigars    Quit date: 06/01/2012    Years since quitting: 12.2   Smokeless tobacco: Never   Tobacco comments:    patient states that he is not somking the pipe or cigars any more  Substance and Sexual Activity   Alcohol use: Not Currently    Comment: rare   Drug use: No   Sexual activity: Yes  Other Topics Concern   Not on file  Social History Narrative   Not on file   Social Drivers of Health   Financial Resource Strain: Low Risk  (  08/31/2024)   Overall Financial Resource Strain (CARDIA)    Difficulty of Paying Living Expenses: Not hard at all  Food Insecurity: No Food Insecurity (08/31/2024)   Hunger Vital Sign    Worried About Running Out of Food in the Last Year: Never true    Ran Out of Food in the Last Year: Never true  Transportation Needs: No Transportation Needs (08/31/2024)   PRAPARE - Administrator, Civil Service (Medical): No    Lack of Transportation (Non-Medical): No  Physical Activity: Inactive (08/31/2024)   Exercise Vital Sign    Days of Exercise per Week: 0 days    Minutes of Exercise per Session: 0 min  Stress: No Stress Concern Present (08/31/2024)   [employer] of Occupational Health - Occupational Stress Questionnaire    Feeling of Stress: Not at all  Social Connections: Moderately Integrated (08/31/2024)   Social Connection and Isolation Panel    Frequency of Communication with Friends and Family: More than three times a week    Frequency of Social Gatherings with Friends and Family: More than three times a week    Attends Religious Services: More than 4 times per year    Active Member of [employer] or Organizations: No    Attends Engineer, structural: Never    Marital Status: Married    Tobacco Counseling Counseling given: Not Answered Tobacco comments: patient states  that he is not somking the pipe or cigars any more    Clinical Intake:  Pre-visit preparation completed: Yes  Pain : No/denies pain     BMI - recorded: 28.28 Nutritional Status: BMI 25 -29 Overweight Nutritional Risks: None Diabetes: Yes CBG done?: No Did pt. bring in CBG monitor from home?: No  Lab Results  Component Value Date   HGBA1C 7.5 (H) 08/30/2024   HGBA1C 6.8 (A) 02/26/2024   HGBA1C 7.2 (H) 08/29/2023     How often do you need to have someone help you when you read instructions, pamphlets, or other written materials from your doctor or pharmacy?: 1 - Never  Interpreter Needed?: No  Information entered by :: Ellouise Haws, LPN   Activities of Daily Living     08/31/2024   10:36 AM  In your present state of health, do you have any difficulty performing the following activities:  Hearing? 0  Vision? 0  Difficulty concentrating or making decisions? 1  Comment Short term  Walking or climbing stairs? 1  Comment knees at times  Dressing or bathing? 0  Doing errands, shopping? 0  Preparing Food and eating ? N  Using the Toilet? N  In the past six months, have you accidently leaked urine? Y  Comment wears a pad  Do you have problems with loss of bowel control? N  Managing your Medications? N  Managing your Finances? N  Housekeeping or managing your Housekeeping? N    Patient Care Team: Jodie Lavern CROME, MD as PCP - General (Family Medicine) Lavona Agent, MD as PCP - Cardiology (Cardiology) Army Dallas NOVAK, MD (Inactive) as Consulting Physician (Cardiothoracic Surgery) Shona Rush, MD (Dermatology) Lavona Agent, MD as Consulting Physician (Cardiology) Dohmeier, Dedra, MD as Consulting Physician (Neurology) Nicholaus Sherlean CROME, [facility] (Inactive) (Pharmacist) rodgers ruby as Consulting Physician (Dermatology)  I have updated your Care Teams any recent Medical Services you may have received from other providers in the past year.      Assessment:   This is a routine wellness examination for Adison.  Hearing/Vision screen Hearing Screening -  Comments:: Pt denies any hearing issues  Vision Screening - Comments:: Wears rx glasses - up to date with routine eye exams with [facility]    Goals Addressed             This Visit's Progress    Patient Stated       Keep sugar levels down        Depression Screen     08/31/2024   10:40 AM 08/30/2024    9:30 AM 02/26/2024    9:22 AM 08/29/2023    9:31 AM 08/29/2023    9:24 AM 08/28/2023   10:23 AM 05/27/2023    9:57 AM  PHQ 2/9 Scores  PHQ - 2 Score 0 0 0 3 0 0 2  PHQ- 9 Score    9   13    Fall Risk     08/31/2024   10:41 AM 08/30/2024    9:23 AM 02/26/2024    9:21 AM 08/29/2023    9:24 AM 08/28/2023   10:24 AM  Fall Risk   Falls in the past year? 0 0 0 0 0  Number falls in past yr: 0 0 0 0 0  Injury with Fall? 0 0 0 0 0  Risk for fall due to : Impaired balance/gait No Fall Risks No Fall Risks No Fall Risks No Fall Risks  Follow up Falls prevention discussed Falls evaluation completed Falls evaluation completed Falls evaluation completed Falls prevention discussed    MEDICARE RISK AT HOME:  Medicare Risk at Home Any stairs in or around the home?: Yes If so, are there any without handrails?: No Home free of loose throw rugs in walkways, pet beds, electrical cords, etc?: Yes Adequate lighting in your home to reduce risk of falls?: Yes Life alert?: No Use of a cane, walker or w/c?: No Grab bars in the bathroom?: Yes Shower chair or bench in shower?: No Elevated toilet seat or a handicapped toilet?: No  TIMED UP AND GO:  Was the test performed?  No  Cognitive Function: 6CIT completed        08/31/2024   10:42 AM 08/28/2023   10:25 AM 08/22/2022   10:09 AM 09/28/2021   10:23 AM 09/04/2020   11:24 AM  6CIT Screen  What Year? 0 points 0 points 0 points 0 points 0 points  What month? 0 points 0 points 0 points 0 points 0 points  What time? 0 points 0  points 0 points 0 points   Count back from 20 0 points 0 points 0 points 0 points 0 points  Months in reverse 0 points 0 points 0 points 0 points 0 points  Repeat phrase 0 points 0 points 0 points 0 points 0 points  Total Score 0 points 0 points 0 points 0 points     Immunizations Immunization History  Administered Date(s) Administered   Fluad Quad(high Dose 65+) 09/16/2019, 09/05/2020, 09/13/2021, 10/30/2022   Fluad Trivalent(High Dose 65+) 08/29/2023   INFLUENZA, HIGH DOSE SEASONAL PF 10/03/2015, 11/11/2016, 10/13/2017, 11/20/2018, 08/30/2024   Influenza Split 11/01/2011, 09/22/2012   Influenza Whole 09/22/2009, 09/20/2010   Influenza,inj,Quad PF,6+ Mos 09/16/2013, 09/29/2014   PFIZER(Purple Top)SARS-COV-2 Vaccination 02/15/2020, 03/07/2020, 10/20/2020   Pneumococcal Conjugate-13 02/25/2014   Pneumococcal Polysaccharide-23 09/01/2007, 07/31/2011   Td 09/01/2007   Zoster Recombinant(Shingrix ) 08/10/2020, 10/10/2020    Screening Tests Health Maintenance  Topic Date Due   COVID-19 Vaccine (4 - 2025-26 season) 09/15/2024 (Originally 08/02/2024)   FOOT EXAM  02/25/2025   HEMOGLOBIN  A1C  02/27/2025   OPHTHALMOLOGY EXAM  06/16/2025   Diabetic kidney evaluation - eGFR measurement  08/30/2025   Diabetic kidney evaluation - Urine ACR  08/30/2025   Medicare Annual Wellness (AWV)  08/31/2025   Pneumococcal Vaccine: 50+ Years  Completed   Influenza Vaccine  Completed   Hepatitis C Screening  Completed   Zoster Vaccines- Shingrix   Completed   HPV VACCINES  Aged Out   Meningococcal B Vaccine  Aged Out   DTaP/Tdap/Td  Discontinued   Colonoscopy  Discontinued    Health Maintenance Items Addressed: See Nurse Notes at the end of this note  Additional Screening:  Vision Screening: Recommended annual ophthalmology exams for early detection of glaucoma and other disorders of the eye. Is the patient up to date with their annual eye exam?  Yes  Who is the provider or what is the name of the  office in which the patient attends annual eye exams? [facility]   Dental Screening: Recommended annual dental exams for proper oral hygiene  Community Resource Referral / Chronic Care Management: CRR required this visit?  No   CCM required this visit?  No   Plan:    I have personally reviewed and noted the following in the patient's chart:   Medical and social history Use of alcohol, tobacco or illicit drugs  Current medications and supplements including opioid prescriptions. Patient is not currently taking opioid prescriptions. Functional ability and status Nutritional status Physical activity Advanced directives List of other physicians Hospitalizations, surgeries, and ER visits in previous 12 months Vitals Screenings to include cognitive, depression, and falls Referrals and appointments  In addition, I have reviewed and discussed with patient certain preventive protocols, quality metrics, and best practice recommendations. A written personalized care plan for preventive services as well as general preventive health recommendations were provided to patient.   Ellouise VEAR Haws, LPN   [id number]   After Visit Summary: (MyChart) Due to this being a telephonic visit, the after visit summary with patients personalized plan was offered to patient via MyChart   Notes: Nothing significant to report at this time.

## 2024-08-31 NOTE — Patient Instructions (Signed)
 Adrian Gamble,  Thank you for taking the time for your Medicare Wellness Visit. I appreciate your continued commitment to your health goals. Please review the care plan we discussed, and feel free to reach out if I can assist you further.  Medicare recommends these wellness visits once per year to help you and your care team stay ahead of potential health issues. These visits are designed to focus on prevention, allowing your provider to concentrate on managing your acute and chronic conditions during your regular appointments.  Please note that Annual Wellness Visits do not include a physical exam. Some assessments may be limited, especially if the visit was conducted virtually. If needed, we may recommend a separate in-person follow-up with your provider.  Ongoing Care Seeing your primary care provider every 3 to 6 months helps us  monitor your health and provide consistent, personalized care.   Referrals If a referral was made during today's visit and you haven't received any updates within two weeks, please contact the referred provider directly to check on the status.  Recommended Screenings:  Health Maintenance  Topic Date Due   Medicare Annual Wellness Visit  08/27/2024   COVID-19 Vaccine (4 - 2025-26 season) 09/15/2024*   Complete foot exam   02/25/2025   Hemoglobin A1C  02/27/2025   Eye exam for diabetics  06/16/2025   Yearly kidney function blood test for diabetes  08/30/2025   Yearly kidney health urinalysis for diabetes  08/30/2025   Pneumococcal Vaccine for age over 78  Completed   Flu Shot  Completed   Hepatitis C Screening  Completed   Zoster (Shingles) Vaccine  Completed   HPV Vaccine  Aged Out   Meningitis B Vaccine  Aged Out   DTaP/Tdap/Td vaccine  Discontinued   Colon Cancer Screening  Discontinued  *Topic was postponed. The date shown is not the original due date.       08/28/2023   10:22 AM  Advanced Directives  Does Patient Have a Medical Advance Directive? Yes   Type of Estate agent of [place];Living will  Copy of Healthcare Power of Attorney in Chart? No - copy requested   Advance Care Planning is important because it: Ensures you receive medical care that aligns with your values, goals, and preferences. Provides guidance to your family and loved ones, reducing the emotional burden of decision-making during critical moments.  Vision: Annual vision screenings are recommended for early detection of glaucoma, cataracts, and diabetic retinopathy. These exams can also reveal signs of chronic conditions such as diabetes and high blood pressure.  Dental: Annual dental screenings help detect early signs of oral cancer, gum disease, and other conditions linked to overall health, including heart disease and diabetes.  Please see the attached documents for additional preventive care recommendations.

## 2024-09-05 ENCOUNTER — Ambulatory Visit: Payer: Self-pay | Admitting: Family Medicine

## 2024-09-05 NOTE — Progress Notes (Signed)
 Please call patient: lab work shows diabetic control has worsened with A1c now up to 7.5. We can consider starting an insulin  shot at night to help lower this number; or he can work a little harder on diet. Also, he is now anemic and platelet levels are low for unclear reasons. Is he taking his vitamin b12 daily? I'd like to see him back in 6 weeks to repeat these blood tests. Please schedule a visit.

## 2024-09-06 ENCOUNTER — Telehealth: Payer: Self-pay

## 2024-09-06 NOTE — Telephone Encounter (Signed)
 Copied from CRM [id number]. Topic: Clinical - Lab/Test Results >> Sep 06, 2024  8:47 AM [name] wrote: Reason for CRM: Patient requests a call back to discuss his lab results.  Spoke with pt and he stated that he would like to try and diet/exercise before going on insulin  and he missed 3 days of B12 but other than that, he has been taking it pretty regular

## 2024-09-08 NOTE — Telephone Encounter (Signed)
 Pt stated he does not want to schedule B12 until he discusses his lab results with Jodie on 10/19/24.

## 2024-09-23 DIAGNOSIS — H3552 Pigmentary retinal dystrophy: Secondary | ICD-10-CM | POA: Diagnosis not present

## 2024-09-23 DIAGNOSIS — H3500 Unspecified background retinopathy: Secondary | ICD-10-CM | POA: Diagnosis not present

## 2024-09-23 DIAGNOSIS — H547 Unspecified visual loss: Secondary | ICD-10-CM | POA: Diagnosis not present

## 2024-09-29 ENCOUNTER — Other Ambulatory Visit: Payer: Self-pay | Admitting: Family Medicine

## 2024-09-29 DIAGNOSIS — E118 Type 2 diabetes mellitus with unspecified complications: Secondary | ICD-10-CM

## 2024-10-19 ENCOUNTER — Ambulatory Visit (INDEPENDENT_AMBULATORY_CARE_PROVIDER_SITE_OTHER): Admitting: Family Medicine

## 2024-10-19 ENCOUNTER — Encounter: Payer: Self-pay | Admitting: Family Medicine

## 2024-10-19 VITALS — BP 118/72 | HR 60 | Temp 96.9°F | Ht 68.0 in | Wt 184.4 lb

## 2024-10-19 DIAGNOSIS — D696 Thrombocytopenia, unspecified: Secondary | ICD-10-CM

## 2024-10-19 DIAGNOSIS — Z7984 Long term (current) use of oral hypoglycemic drugs: Secondary | ICD-10-CM | POA: Diagnosis not present

## 2024-10-19 DIAGNOSIS — M545 Low back pain, unspecified: Secondary | ICD-10-CM

## 2024-10-19 DIAGNOSIS — E1159 Type 2 diabetes mellitus with other circulatory complications: Secondary | ICD-10-CM

## 2024-10-19 DIAGNOSIS — E1121 Type 2 diabetes mellitus with diabetic nephropathy: Secondary | ICD-10-CM | POA: Diagnosis not present

## 2024-10-19 DIAGNOSIS — E538 Deficiency of other specified B group vitamins: Secondary | ICD-10-CM | POA: Diagnosis not present

## 2024-10-19 DIAGNOSIS — R2689 Other abnormalities of gait and mobility: Secondary | ICD-10-CM | POA: Diagnosis not present

## 2024-10-19 DIAGNOSIS — R300 Dysuria: Secondary | ICD-10-CM | POA: Diagnosis not present

## 2024-10-19 LAB — CBC WITH DIFFERENTIAL/PLATELET
Basophils Absolute: 0 K/uL (ref 0.0–0.1)
Basophils Relative: 0.6 % (ref 0.0–3.0)
Eosinophils Absolute: 0.1 K/uL (ref 0.0–0.7)
Eosinophils Relative: 2 % (ref 0.0–5.0)
HCT: 39 % (ref 39.0–52.0)
Hemoglobin: 13.1 g/dL (ref 13.0–17.0)
Lymphocytes Relative: 19.7 % (ref 12.0–46.0)
Lymphs Abs: 1 K/uL (ref 0.7–4.0)
MCHC: 33.5 g/dL (ref 30.0–36.0)
MCV: 89.1 fl (ref 78.0–100.0)
Monocytes Absolute: 0.5 K/uL (ref 0.1–1.0)
Monocytes Relative: 9.4 % (ref 3.0–12.0)
Neutro Abs: 3.6 K/uL (ref 1.4–7.7)
Neutrophils Relative %: 68.3 % (ref 43.0–77.0)
Platelets: 81 K/uL — ABNORMAL LOW (ref 150.0–400.0)
RBC: 4.38 Mil/uL (ref 4.22–5.81)
RDW: 14 % (ref 11.5–15.5)
WBC: 5.2 K/uL (ref 4.0–10.5)

## 2024-10-19 LAB — URINALYSIS, ROUTINE W REFLEX MICROSCOPIC
Bilirubin Urine: NEGATIVE
Ketones, ur: NEGATIVE
Nitrite: POSITIVE — AB
Specific Gravity, Urine: 1.01 (ref 1.000–1.030)
Urine Glucose: NEGATIVE
Urobilinogen, UA: 0.2 (ref 0.0–1.0)
pH: 6 (ref 5.0–8.0)

## 2024-10-19 LAB — VITAMIN B12: Vitamin B-12: >1500 pg/mL — ABNORMAL HIGH (ref 211–911)

## 2024-10-19 NOTE — Patient Instructions (Signed)
 Please return in 6weeks to recheck diabetes.  If you have any questions or concerns, please don't hesitate to send me a message via MyChart or call the office at [phone]. Thank you for visiting with us  today! It's our pleasure caring for you.   We will call you with information regarding your referral appointment. I want you to see a neurologist. If you do not hear from us  within the next 2 weeks, please let me know. It can take 1-2 weeks to get appointments set up with the specialists.    Back Exercises The following exercises strengthen the muscles that help to support the trunk (torso) and back. They also help to keep the lower back flexible. Doing these exercises can help to prevent or lessen existing low back pain. If you have back pain or discomfort, try doing these exercises 2-3 times each day or as told by your health care provider. As your pain improves, do them once each day, but increase the number of times that you repeat the steps for each exercise (do more repetitions). To prevent the recurrence of back pain, continue to do these exercises once each day or as told by your health care provider. Do exercises exactly as told by your health care provider and adjust them as directed. It is normal to feel mild stretching, pulling, tightness, or discomfort as you do these exercises, but you should stop right away if you feel sudden pain or your pain gets worse. Exercises Single knee to chest Repeat these steps 3-5 times for each leg: Lie on your back on a firm bed or the floor with your legs extended. Bring one knee to your chest. Your other leg should stay extended and in contact with the floor. Hold your knee in place by grabbing your knee or thigh with both hands and hold. Pull on your knee until you feel a gentle stretch in your lower back or buttocks. Hold the stretch for 10-30 seconds. Slowly release and straighten your leg.  Pelvic tilt Repeat these steps 5-10 times: Lie on  your back on a firm bed or the floor with your legs extended. Bend your knees so they are pointing toward the ceiling and your feet are flat on the floor. Tighten your lower abdominal muscles to press your lower back against the floor. This motion will tilt your pelvis so your tailbone points up toward the ceiling instead of pointing to your feet or the floor. With gentle tension and even breathing, hold this position for 5-10 seconds.  Cat-cow Repeat these steps until your lower back becomes more flexible: Get into a hands-and-knees position on a firm bed or the floor. Keep your hands under your shoulders, and keep your knees under your hips. You may place padding under your knees for comfort. Let your head hang down toward your chest. Contract your abdominal muscles and point your tailbone toward the floor so your lower back becomes rounded like the back of a cat. Hold this position for 5 seconds. Slowly lift your head, let your abdominal muscles relax, and point your tailbone up toward the ceiling so your back forms a sagging arch like the back of a cow. Hold this position for 5 seconds.  Press-ups Repeat these steps 5-10 times: Lie on your abdomen (face-down) on a firm bed or the floor. Place your palms near your head, about shoulder-width apart. Keeping your back as relaxed as possible and keeping your hips on the floor, slowly straighten your arms to raise  the top half of your body and lift your shoulders. Do not use your back muscles to raise your upper torso. You may adjust the placement of your hands to make yourself more comfortable. Hold this position for 5 seconds while you keep your back relaxed. Slowly return to lying flat on the floor.  Bridges Repeat these steps 10 times: Lie on your back on a firm bed or the floor. Bend your knees so they are pointing toward the ceiling and your feet are flat on the floor. Your arms should be flat at your sides, next to your body. Tighten  your buttocks muscles and lift your buttocks off the floor until your waist is at almost the same height as your knees. You should feel the muscles working in your buttocks and the back of your thighs. If you do not feel these muscles, slide your feet 1-2 inches (2.5-5 cm) farther away from your buttocks. Hold this position for 3-5 seconds. Slowly lower your hips to the starting position, and allow your buttocks muscles to relax completely. If this exercise is too easy, try doing it with your arms crossed over your chest. Abdominal crunches Repeat these steps 5-10 times: Lie on your back on a firm bed or the floor with your legs extended. Bend your knees so they are pointing toward the ceiling and your feet are flat on the floor. Cross your arms over your chest. Tip your chin slightly toward your chest without bending your neck. Tighten your abdominal muscles and slowly raise your torso high enough to lift your shoulder blades a tiny bit off the floor. Avoid raising your torso higher than that because it can put too much stress on your lower back and does not help to strengthen your abdominal muscles. Slowly return to your starting position.  Back lifts Repeat these steps 5-10 times: Lie on your abdomen (face-down) with your arms at your sides, and rest your forehead on the floor. Tighten the muscles in your legs and your buttocks. Slowly lift your chest off the floor while you keep your hips pressed to the floor. Keep the back of your head in line with the curve in your back. Your eyes should be looking at the floor. Hold this position for 3-5 seconds. Slowly return to your starting position.  Contact a health care provider if: Your back pain or discomfort gets much worse when you do an exercise. Your worsening back pain or discomfort does not lessen within 2 hours after you exercise. If you have any of these problems, stop doing these exercises right away. Do not do them again unless your  health care provider says that you can. Get help right away if: You develop sudden, severe back pain. If this happens, stop doing the exercises right away. Do not do them again unless your health care provider says that you can. This information is not intended to replace advice given to you by your health care provider. Make sure you discuss any questions you have with your health care provider. Document Revised: 12/22/2022 Document Reviewed: 01/31/2021 Elsevier Patient Education  2024 [employer].

## 2024-10-20 ENCOUNTER — Ambulatory Visit: Payer: Self-pay | Admitting: Family Medicine

## 2024-10-20 ENCOUNTER — Other Ambulatory Visit: Payer: Self-pay

## 2024-10-20 ENCOUNTER — Other Ambulatory Visit

## 2024-10-20 DIAGNOSIS — N390 Urinary tract infection, site not specified: Secondary | ICD-10-CM

## 2024-10-20 DIAGNOSIS — D696 Thrombocytopenia, unspecified: Secondary | ICD-10-CM | POA: Diagnosis not present

## 2024-10-20 LAB — IRON,TIBC AND FERRITIN PANEL
%SAT: 25 % (ref 20–48)
Ferritin: 81 ng/mL (ref 24–380)
Iron: 80 ug/dL (ref 50–180)
TIBC: 323 ug/dL (ref 250–425)

## 2024-10-20 MED ORDER — SULFAMETHOXAZOLE-TRIMETHOPRIM 800-160 MG PO TABS: 1.0000 | ORAL_TABLET | Freq: Two times a day (BID) | ORAL | 0 refills | Status: DC

## 2024-10-20 NOTE — Progress Notes (Signed)
 Subjective  CC:  Chief Complaint  Patient presents with   Type 2 diabetes mellitus with complication, without long-te    Follow up   Dysuria    Pt c/o dysuria, present for 5 weeks.    Back Pain    Pt c/o lower back pain usually when bending over, present for 3-4 weeks.     HPI: Adrian Gamble is a 78 y.o. male who presents to the office today for follow up of diabetes and problems listed above in the chief complaint.  Diabetes follow up: His diabetic control is reported as Improved. Fasting sugars are improved. He cut out sweets and sweetened beverages. He denies exertional CP or SOB or symptomatic hypoglycemia. He denies foot sores or paresthesias. On metformin  xr 500 daily only. Has had hypoglycemia in past with glucotrol . His recent urine testing shows microalbuminuria; lisinopril  was started. Tolerating well.  Mvt d/o: we discussed shuffling gait and masked facie at last appt with his wife. He denies tremor. He admits to difficulty at times with initiating movements: can take awhile to get going with walking for example. He denies cognitive concerns. 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 Neg sleep eval at that time I believe. Mention of early lewy body's or parkinson was thought: that was back in 2020.  C/o several weeks of dysuria. He denies rash, redness or discharge or itching.  Mild intermittent low back pain wo injury or radicular sxs are present.   Thrombocyotpenia, new finding. Need to recheck cbc; no petechiae or bruising.  Lab Results  Component Value Date   WBC 5.2 10/19/2024   HGB 13.1 10/19/2024   HCT 39.0 10/19/2024   MCV 89.1 10/19/2024   PLT 81.0 (L) 10/19/2024    Wt Readings from Last 3 Encounters:  10/19/24 184 lb 6.4 oz (83.6 kg)  08/31/24 186 lb (84.4 kg)  08/30/24 186 lb 12.8 oz (84.7 kg)    BP Readings from Last 3 Encounters:  10/19/24 118/72  08/30/24 132/74  02/26/24 128/78    Assessment  1. Type 2 diabetes mellitus with vascular  disease (HCC)   2. Microalbuminuric diabetic nephropathy (HCC)   3. Thrombocytopenia   4. Vitamin B12 deficiency   5. Shuffling gait   6. Low back pain, episodic   7. Dysuria      Plan  Diabetes is currently marginally controlled. Control is improving w/ dietary changes. Will reassess in 6 weeks to see if he is back at goal. Continue metformin . Continue lisinopril  for microalbuminuria.  Recheck CBC for platelets. Check smear if needed. Check b12 and iron levels.  Refer back to neurology to evaluate for possible parkinson's disease.  Rec low back exercises. See AVS Check for UTI; at risk for balanitis as well. No upper tract sxs.   Follow up: 6 weeks to recheck diabetes Orders Placed This Encounter  Procedures   Urine Culture   CBC with Differential/Platelet   Iron, TIBC and Ferritin Panel   Vitamin B12   Urinalysis, Routine w reflex microscopic   Ambulatory referral to Neurology   No orders of the defined types were placed in this encounter.     Immunization History  Administered Date(s) Administered   Fluad Quad(high Dose 65+) 09/16/2019, 09/05/2020, 09/13/2021, 10/30/2022   Fluad Trivalent(High Dose 65+) 08/29/2023   INFLUENZA, HIGH DOSE SEASONAL PF 10/03/2015, 11/11/2016, 10/13/2017, 11/20/2018, 08/30/2024   Influenza Split 11/01/2011, 09/22/2012   Influenza Whole 09/22/2009, 09/20/2010   Influenza,inj,Quad PF,6+ Mos 09/16/2013, 09/29/2014   PFIZER(Purple  Top)SARS-COV-2 Vaccination 02/15/2020, 03/07/2020, 10/20/2020   Pneumococcal Conjugate-13 02/25/2014   Pneumococcal Polysaccharide-23 09/01/2007, 07/31/2011   Td 09/01/2007   Zoster Recombinant(Shingrix ) 08/10/2020, 10/10/2020    Diabetes Related Lab Review: Lab Results  Component Value Date   HGBA1C 7.5 (H) 08/30/2024   HGBA1C 6.8 (A) 02/26/2024   HGBA1C 7.2 (H) 08/29/2023    Lab Results  Component Value Date   MICROALBUR 13.5 (H) 08/30/2024   Lab Results  Component Value Date   CREATININE 0.95  08/30/2024   BUN 16 08/30/2024   NA 141 08/30/2024   K 4.0 08/30/2024   CL 106 08/30/2024   CO2 26 08/30/2024   Lab Results  Component Value Date   CHOL 115 08/30/2024   CHOL 97 08/29/2023   CHOL 100 02/06/2023   Lab Results  Component Value Date   HDL 43.40 08/30/2024   HDL 37.10 (L) 08/29/2023   HDL 35.50 (L) 02/06/2023   Lab Results  Component Value Date   LDLCALC 59 08/30/2024   LDLCALC 38 08/29/2023   LDLCALC 40 02/06/2023   Lab Results  Component Value Date   TRIG 63.0 08/30/2024   TRIG 106.0 08/29/2023   TRIG 122.0 02/06/2023   Lab Results  Component Value Date   CHOLHDL 3 08/30/2024   CHOLHDL 3 08/29/2023   CHOLHDL 3 02/06/2023   No results found for: LDLDIRECT The ASCVD Risk score (Arnett DK, et al., 2019) failed to calculate for the following reasons:   The 2019 ASCVD risk score is only valid for ages 65 to 107   Risk score cannot be calculated because patient has a medical history suggesting prior/existing ASCVD I have reviewed the PMH, Fam and Soc history. 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     Hypertension associated with diabetes (HCC) 10/05/2018    Priority: High   Type 2 diabetes mellitus with vascular disease (HCC) 08/12/2016    Priority: High    Low dose metformin  due to GI side effects Intolerant to SGLT2i due to recurrent severe balanitis Started lisinopril  5mg  01/2024 due to + urin microalbunuria    Hypothyroidism (acquired) 05/29/2009    Priority: High    Hyperlipidemia associated with type 2 diabetes mellitus (HCC) 09/01/2007    Priority: High   Coronary artery disease of native artery of native heart with stable angina pectoris     Priority: High   Bruit 05/17/2021    Priority: Medium     Carotid dopplers 06/2022; bilateral minimal stenosis 1-39%    Bilateral primary osteoarthritis of knee 04/27/2020    Priority: Medium     xrays 04/2020, tricompartmental, worse medially    Diverticulosis 07/13/2019    Priority: Medium    GERD 09/01/2007    Priority: Medium    Peptic ulcer 09/01/2007    Priority: Medium    Former smoker 07/13/2019    Priority: Low   Testosterone  deficiency 09/01/2007    Priority: Low  Social History: Patient  reports that he quit smoking about 12 years ago. His smoking use included pipe and cigars. He has never used smokeless tobacco. He reports that he does not currently use alcohol. He reports that he does not use drugs.  Review of Systems: Ophthalmic: negative for eye pain, loss of vision or double vision Cardiovascular: negative for chest pain Respiratory: negative for SOB or persistent cough Gastrointestinal: negative for abdominal pain Genitourinary: negative for dysuria or gross hematuria MSK: negative for foot lesions Neurologic: negative for weakness or gait disturbance  Objective  Vitals: BP 118/72 (BP Location: Left Arm, Patient Position: Sitting, Cuff Size: Normal)   Pulse 60   Temp (!) 96.9 F (36.1 C) (Temporal)   Ht 5' 8 (1.727 m)   Wt 184 lb 6.4 oz (83.6 kg)   SpO2 98%   BMI 28.04 kg/m  General: well appearing, no acute distress , masked facie Psych:  Alert and oriented, normal mood and affect HEENT:  Normocephalic, atraumatic, moist mucous membranes, supple neck  Cardiovascular:  Nl S1 and S2, RRR without murmur, gallop or rub. no edema Respiratory:  Good breath sounds bilaterally, CTAB with normal effort, no rales Gastrointestinal: normal BS, soft, nontender Skin:  Warm, no  rashes Neurologic:   Mental status is normal, shuffles. No cog wheeling or tremor   Diabetic education: ongoing education regarding chronic disease management for diabetes was given today. We continue to reinforce the ABC's of diabetic management: A1c (<7 or 8 dependent upon patient), tight blood pressure control, and cholesterol management with goal LDL < 100 minimally. We discuss diet strategies, exercise recommendations, medication options and possible side effects. At each visit, we review recommended immunizations and preventive care recommendations for diabetics and stress that good diabetic control can prevent other problems. See below for this patient's data.   Commons side effects, risks, benefits, and alternatives for medications and treatment plan prescribed today were discussed, and the patient expressed understanding of the given instructions. Patient is instructed to call or message via MyChart if he/she has any questions or concerns regarding our treatment plan. No barriers to understanding were identified. We discussed Red Flag symptoms and signs in detail. Patient expressed understanding regarding what to do in case of urgent or emergency type symptoms.  Medication list was reconciled, printed and provided to the patient in AVS. Patient instructions and summary information was reviewed with the patient as documented in the AVS. This note was prepared with assistance of Dragon voice recognition software. Occasional wrong-word or sound-a-like substitutions may have occurred due to the inherent limitations of voice recognition software

## 2024-10-20 NOTE — Progress Notes (Signed)
 Please call patient: His urine is infected. Please order Septra DS 1 tab po bid x 7 days. Also, his platelets remain low. Please see if lab can add on a peripheral smear. Tell patient to notify me for any bruising or rash. B12 and iron levels are fine.

## 2024-10-21 ENCOUNTER — Encounter: Payer: Self-pay | Admitting: Family Medicine

## 2024-10-21 LAB — PERIPHERAL BLOOD SMEAR REVIEW

## 2024-10-22 LAB — URINE CULTURE
MICRO NUMBER:: 17250595
SPECIMEN QUALITY:: ADEQUATE

## 2024-10-27 ENCOUNTER — Other Ambulatory Visit: Payer: Self-pay | Admitting: Family Medicine

## 2024-10-30 ENCOUNTER — Encounter: Payer: Self-pay | Admitting: Internal Medicine

## 2024-11-02 ENCOUNTER — Encounter: Payer: Self-pay | Admitting: Family Medicine

## 2024-11-04 NOTE — Telephone Encounter (Signed)
 Pt called for status check on message sent via mychart the other day. He has not heard anything back yet and is wondering if someone can follow up with him at his home phone number,  [phone]

## 2024-11-07 NOTE — Telephone Encounter (Signed)
 Pt will be called. See other note for details

## 2024-11-08 ENCOUNTER — Other Ambulatory Visit: Payer: Self-pay

## 2024-11-08 DIAGNOSIS — D696 Thrombocytopenia, unspecified: Secondary | ICD-10-CM

## 2024-11-09 ENCOUNTER — Encounter: Payer: Self-pay | Admitting: Neurology

## 2024-11-10 ENCOUNTER — Encounter: Payer: Self-pay | Admitting: Family Medicine

## 2024-11-10 DIAGNOSIS — E1159 Type 2 diabetes mellitus with other circulatory complications: Secondary | ICD-10-CM

## 2024-11-10 MED ORDER — ACCU-CHEK GUIDE W/DEVICE KIT: PACK | 0 refills | Status: AC

## 2024-11-10 MED ORDER — ACCU-CHEK SOFTCLIX LANCETS MISC: 12 refills | Status: DC

## 2024-11-11 ENCOUNTER — Other Ambulatory Visit: Payer: Self-pay

## 2024-11-11 ENCOUNTER — Other Ambulatory Visit

## 2024-11-11 ENCOUNTER — Telehealth: Payer: Self-pay

## 2024-11-11 DIAGNOSIS — E1159 Type 2 diabetes mellitus with other circulatory complications: Secondary | ICD-10-CM

## 2024-11-11 DIAGNOSIS — D696 Thrombocytopenia, unspecified: Secondary | ICD-10-CM | POA: Diagnosis not present

## 2024-11-11 LAB — CBC WITH DIFFERENTIAL/PLATELET
Basophils Absolute: 0 K/uL (ref 0.0–0.1)
Basophils Relative: 0.5 % (ref 0.0–3.0)
Eosinophils Absolute: 0.1 K/uL (ref 0.0–0.7)
Eosinophils Relative: 1.2 % (ref 0.0–5.0)
HCT: 38.6 % — ABNORMAL LOW (ref 39.0–52.0)
Hemoglobin: 13 g/dL (ref 13.0–17.0)
Lymphocytes Relative: 25 % (ref 12.0–46.0)
Lymphs Abs: 1.3 K/uL (ref 0.7–4.0)
MCHC: 33.7 g/dL (ref 30.0–36.0)
MCV: 90.2 fl (ref 78.0–100.0)
Monocytes Absolute: 0.5 K/uL (ref 0.1–1.0)
Monocytes Relative: 9.6 % (ref 3.0–12.0)
Neutro Abs: 3.4 K/uL (ref 1.4–7.7)
Neutrophils Relative %: 63.7 % (ref 43.0–77.0)
Platelets: 77 K/uL — ABNORMAL LOW (ref 150.0–400.0)
RBC: 4.29 Mil/uL (ref 4.22–5.81)
RDW: 13.6 % (ref 11.5–15.5)
WBC: 5.3 K/uL (ref 4.0–10.5)

## 2024-11-11 MED ORDER — ACCU-CHEK SOFTCLIX LANCETS MISC: 12 refills | Status: AC

## 2024-11-11 NOTE — Telephone Encounter (Signed)
 Copied from CRM #[id number]. Topic: Clinical - Medication Question >> Nov 10, 2024  2:41 PM Thersia BROCKS wrote: Reason for CRM: [facility] pharmacy - questions on dicretcions  Accu-Chek Softclix Lancets lancets  [phone]  Rx has been sent and updated at the pharmacy

## 2024-11-14 ENCOUNTER — Ambulatory Visit: Payer: Self-pay | Admitting: Family Medicine

## 2024-11-14 NOTE — Progress Notes (Signed)
 Please call patient:platelets continue to be lower; please refer to hematology for thrombocytopenia

## 2024-11-15 ENCOUNTER — Other Ambulatory Visit: Payer: Self-pay

## 2024-11-15 DIAGNOSIS — E1159 Type 2 diabetes mellitus with other circulatory complications: Secondary | ICD-10-CM

## 2024-11-15 DIAGNOSIS — E118 Type 2 diabetes mellitus with unspecified complications: Secondary | ICD-10-CM

## 2024-11-15 DIAGNOSIS — D696 Thrombocytopenia, unspecified: Secondary | ICD-10-CM

## 2024-11-15 MED ORDER — ACCU-CHEK GUIDE TEST VI STRP: ORAL_STRIP | 12 refills | Status: DC

## 2024-11-21 ENCOUNTER — Other Ambulatory Visit: Payer: Self-pay | Admitting: Family Medicine

## 2024-11-21 DIAGNOSIS — E1159 Type 2 diabetes mellitus with other circulatory complications: Secondary | ICD-10-CM

## 2024-11-21 DIAGNOSIS — E118 Type 2 diabetes mellitus with unspecified complications: Secondary | ICD-10-CM

## 2024-11-28 ENCOUNTER — Encounter: Payer: Self-pay | Admitting: Family Medicine

## 2024-11-29 ENCOUNTER — Other Ambulatory Visit: Payer: Self-pay | Admitting: Family Medicine

## 2024-12-03 ENCOUNTER — Other Ambulatory Visit: Payer: Self-pay

## 2024-12-03 DIAGNOSIS — E1159 Type 2 diabetes mellitus with other circulatory complications: Secondary | ICD-10-CM

## 2024-12-03 DIAGNOSIS — E118 Type 2 diabetes mellitus with unspecified complications: Secondary | ICD-10-CM

## 2024-12-03 MED ORDER — ACCU-CHEK GUIDE TEST VI STRP: ORAL_STRIP | 12 refills | Status: AC

## 2024-12-03 NOTE — Telephone Encounter (Signed)
 Rx has been updated.

## 2024-12-08 ENCOUNTER — Ambulatory Visit (INDEPENDENT_AMBULATORY_CARE_PROVIDER_SITE_OTHER): Admitting: Family Medicine

## 2024-12-08 ENCOUNTER — Encounter: Payer: Self-pay | Admitting: Family Medicine

## 2024-12-08 ENCOUNTER — Ambulatory Visit: Payer: Self-pay | Admitting: Family Medicine

## 2024-12-08 VITALS — BP 138/80 | HR 64 | Temp 97.9°F | Ht 68.0 in | Wt 188.0 lb

## 2024-12-08 DIAGNOSIS — Z7984 Long term (current) use of oral hypoglycemic drugs: Secondary | ICD-10-CM

## 2024-12-08 DIAGNOSIS — I152 Hypertension secondary to endocrine disorders: Secondary | ICD-10-CM

## 2024-12-08 DIAGNOSIS — M545 Low back pain, unspecified: Secondary | ICD-10-CM

## 2024-12-08 DIAGNOSIS — E1159 Type 2 diabetes mellitus with other circulatory complications: Secondary | ICD-10-CM | POA: Diagnosis not present

## 2024-12-08 DIAGNOSIS — E119 Type 2 diabetes mellitus without complications: Secondary | ICD-10-CM | POA: Diagnosis not present

## 2024-12-08 DIAGNOSIS — D696 Thrombocytopenia, unspecified: Secondary | ICD-10-CM

## 2024-12-08 LAB — CBC WITH DIFFERENTIAL/PLATELET
Basophils Absolute: 0 K/uL (ref 0.0–0.1)
Basophils Relative: 0.3 % (ref 0.0–3.0)
Eosinophils Absolute: 0.1 K/uL (ref 0.0–0.7)
Eosinophils Relative: 3 % (ref 0.0–5.0)
HCT: 40.4 % (ref 39.0–52.0)
Hemoglobin: 13.4 g/dL (ref 13.0–17.0)
Lymphocytes Relative: 25.1 % (ref 12.0–46.0)
Lymphs Abs: 1.1 K/uL (ref 0.7–4.0)
MCHC: 33.3 g/dL (ref 30.0–36.0)
MCV: 90.5 fl (ref 78.0–100.0)
Monocytes Absolute: 0.4 K/uL (ref 0.1–1.0)
Monocytes Relative: 9.2 % (ref 3.0–12.0)
Neutro Abs: 2.8 K/uL (ref 1.4–7.7)
Neutrophils Relative %: 62.4 % (ref 43.0–77.0)
Platelets: 86 K/uL — ABNORMAL LOW (ref 150.0–400.0)
RBC: 4.46 Mil/uL (ref 4.22–5.81)
RDW: 14.1 % (ref 11.5–15.5)
WBC: 4.4 K/uL (ref 4.0–10.5)

## 2024-12-08 LAB — HEMOGLOBIN A1C: Hgb A1c MFr Bld: 6.6 % — ABNORMAL HIGH (ref 4.6–6.5)

## 2024-12-08 NOTE — Progress Notes (Signed)
 See mychart note Dear Mr. Stump, Your diabetes looks good!! Congrats!! Your platelets remain low but are stable. We will see what hematology thinks is needed now.  It was good seeing you. Sincerely, Dr. Jodie

## 2024-12-08 NOTE — Progress Notes (Signed)
 "  Subjective  CC:  Chief Complaint  Patient presents with   Diabetes    HPI: Adrian Gamble is a 79 y.o. male who presents to the office today for follow up of diabetes and problems listed above in the chief complaint.  Discussed the use of AI scribe software for clinical note transcription with the patient, who gave verbal consent to proceed.  History of Present Illness Adrian Gamble is an 79 year old male with thrombocytopenia and diabetes who presents for follow-up on gait issues and blood sugar management, low platelets and back pain  Gait disturbance- see last note; referred to neurology: possible parkinsons/ lewy body etc - Gait impairment, characterized by shuffling and stiffness, especially upon rising from a seated position. - Describes gait as similar to a 'Velinda Southgate impersonation'. - Experiences occasional near falls and a sensation of instability, particularly when turning. - Does not utilize assistive devices such as a walker or cane, but intermittently relies on his wife for support. - Gait disturbance limits ability to exercise.  Glycemic control/DM f/u: eating well; low dose metformin  only. Intolerant to sglt-2i - Diabetes management affected by dietary lapses during birthday, Thanksgiving, and Christmas. - Recent issues with glucose checker resulting in more frequent low readings than usual. But in goal range: fastings 90s at times. - Single episode of hyperglycemia with a reading of 237 mg/dL after consuming oatmeal with fruit, which is atypical for him. - Attempts to maintain a reasonable diet and recognizes the need for increased physical activity, though limited by gait disturbance.  Thrombocytopenia - Scheduled to see a hematologist for evaluation and management of thrombocytopenia. No petechiae  Low back pain: see last note.  Very mild and position dependent: only has pain if lifts right leg while leaning forward: like if putting on pants or socks. Back exercises are  helping. No radicular sxs.     Wt Readings from Last 3 Encounters:  12/08/24 188 lb (85.3 kg)  10/19/24 184 lb 6.4 oz (83.6 kg)  08/31/24 186 lb (84.4 kg)    BP Readings from Last 3 Encounters:  12/08/24 138/80  10/19/24 118/72  08/30/24 132/74    Assessment  1. Type 2 diabetes mellitus with vascular disease (HCC)   2. Diabetes mellitus treated with oral medication (HCC)   3. Thrombocytopenia   4. Hypertension associated with diabetes (HCC)   5. Low back pain, episodic      Plan  Assessment and Plan Assessment & Plan Adult Wellness Visit Routine wellness visit with discussion on general health and management of chronic conditions. - Continue current management of chronic conditions.  Possible Parkinson's disease or Lewy body disease Chronic, slow progression of symptoms suggestive of Parkinson's disease or Lewy body disease, including gait issues and occasional falls. No urgent need for immediate intervention. - Referred to neurology for evaluation of possible Parkinson's disease or Lewy body disease. - discussed fall prevention  Type 2 diabetes mellitus Recent dietary lapses. Blood glucose levels have been variable, with some low readings and occasional high readings. Recent change in glucose monitor causing confusion. Goal is to maintain HbA1c below 7.5%. - Advised to check blood glucose two hours postprandial for better assessment. - Encouraged dietary adherence and regular exercise as tolerated. - continue met 500  Thrombocytopenia Chronic thrombocytopenia with platelet levels lower than normal but not at a dangerous level. Further evaluation needed to determine underlying cause. - Referred to hematology for evaluation of thrombocytopenia. - recheck cbc today so specialist will have trend  Hypertension - Continue monitoring blood pressure. Above goal today but h/o lightheadedness; will monitor.   LBP: no red flag sxs; differential includes spinal stenosis, OA,  muscle or ligament pain; since only positional, rec exercises, consider chiropractor and further eval if worsens.     Follow up: 3 mo for recheck Orders Placed This Encounter  Procedures   CBC with Differential/Platelet   Hemoglobin A1c   No orders of the defined types were placed in this encounter.     Immunization History  Administered Date(s) Administered   Fluad Quad(high Dose 65+) 09/16/2019, 09/05/2020, 09/13/2021, 10/30/2022   Fluad Trivalent(High Dose 65+) 08/29/2023   INFLUENZA, HIGH DOSE SEASONAL PF 10/03/2015, 11/11/2016, 10/13/2017, 11/20/2018, 08/30/2024   Influenza Split 11/01/2011, 09/22/2012   Influenza Whole 09/22/2009, 09/20/2010   Influenza,inj,Quad PF,6+ Mos 09/16/2013, 09/29/2014   PFIZER(Purple Top)SARS-COV-2 Vaccination 02/15/2020, 03/07/2020, 10/20/2020   Pneumococcal Conjugate-13 02/25/2014   Pneumococcal Polysaccharide-23 09/01/2007, 07/31/2011   Td 09/01/2007   Zoster Recombinant(Shingrix ) 08/10/2020, 10/10/2020    Diabetes Related Lab Review: Lab Results  Component Value Date   HGBA1C 7.5 (H) 08/30/2024   HGBA1C 6.8 (A) 02/26/2024   HGBA1C 7.2 (H) 08/29/2023    Lab Results  Component Value Date   MICROALBUR 13.5 (H) 08/30/2024   Lab Results  Component Value Date   CREATININE 0.95 08/30/2024   BUN 16 08/30/2024   NA 141 08/30/2024   K 4.0 08/30/2024   CL 106 08/30/2024   CO2 26 08/30/2024   Lab Results  Component Value Date   CHOL 115 08/30/2024   CHOL 97 08/29/2023   CHOL 100 02/06/2023   Lab Results  Component Value Date   HDL 43.40 08/30/2024   HDL 37.10 (L) 08/29/2023   HDL 35.50 (L) 02/06/2023   Lab Results  Component Value Date   LDLCALC 59 08/30/2024   LDLCALC 38 08/29/2023   LDLCALC 40 02/06/2023   Lab Results  Component Value Date   TRIG 63.0 08/30/2024   TRIG 106.0 08/29/2023   TRIG 122.0 02/06/2023   Lab Results  Component Value Date   CHOLHDL 3 08/30/2024   CHOLHDL 3 08/29/2023   CHOLHDL 3 02/06/2023    No results found for: LDLDIRECT The ASCVD Risk score (Arnett DK, et al., 2019) failed to calculate for the following reasons:   The 2019 ASCVD risk score is only valid for ages 24 to 71   Risk score cannot be calculated because patient has a medical history suggesting prior/existing ASCVD   * - Cholesterol units were assumed I have reviewed the PMH, Fam and Soc history. 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 Started lisinopril  5mg  01/2024 due to + urin microalbunuria    Hypothyroidism (acquired) 05/29/2009     Priority: High   Hyperlipidemia associated with type 2 diabetes mellitus (HCC) 09/01/2007     Priority: High   Coronary artery disease of native artery of native heart with stable angina pectoris      Priority: High   Bruit 05/17/2021     Priority: Medium     Carotid dopplers 06/2022; bilateral minimal stenosis 1-39%    Bilateral primary osteoarthritis of knee 04/27/2020     Priority: Medium     xrays 04/2020, tricompartmental, worse medially    Diverticulosis 07/13/2019     Priority: Medium    GERD 09/01/2007     Priority: Medium    Peptic ulcer 09/01/2007     Priority: Medium    Former smoker 07/13/2019      Priority: Low   Testosterone  deficiency 09/01/2007     Priority: Low    Social History: Patient  reports that he quit smoking about 12 years ago. His smoking use included pipe and cigars. He has never used smokeless tobacco. He reports that he does not currently use alcohol. He reports that he does not use drugs.  Review of Systems: Ophthalmic: negative for eye pain, loss of vision or double vision Cardiovascular: negative for chest pain Respiratory: negative for SOB or persistent cough Gastrointestinal: negative for abdominal pain Genitourinary: negative for dysuria or gross hematuria MSK: negative for foot lesions Neurologic: negative for weakness or gait disturbance  Objective  Vitals: BP 138/80   Pulse 64   Temp 97.9 F (36.6 C)   Ht 5' 8 (1.727 m)   Wt 188 lb (85.3 kg)   SpO2 97%   BMI 28.59 kg/m  General: well appearing, no acute distress  Psych:  Alert and oriented, normal mood and affect HEENT:  Normocephalic, atraumatic, moist mucous membranes, supple neck  Cardiovascular:  RRR Respiratory:  Good breath sounds bilaterally, CTAB with normal effort, no rales Shuffling narrow based gait  Diabetic education: ongoing education regarding chronic disease management for diabetes was given today. We continue to reinforce the ABC's of diabetic management: A1c (<7 or 8 dependent upon patient), tight blood pressure control, and cholesterol management with goal LDL < 100 minimally. We discuss diet strategies, exercise recommendations, medication options and possible side effects. At each visit, we review recommended immunizations and preventive care recommendations for diabetics and stress that good diabetic control can prevent other problems. See below for this patient's data. Commons side effects, risks, benefits, and alternatives for medications and treatment plan prescribed today were discussed, and the patient expressed understanding of the given instructions. Patient is  instructed to call or message via MyChart if he/she has any questions or concerns regarding our treatment plan. No barriers to understanding were identified. We discussed Red Flag symptoms and signs in detail. Patient expressed understanding regarding what to do in case of urgent or emergency type symptoms.  Medication list was reconciled, printed and provided to the patient in AVS. Patient instructions and summary information was reviewed with the patient as documented in the AVS. This note was prepared with assistance of Dragon voice recognition software. Occasional wrong-word or sound-a-like substitutions may have occurred due to the inherent limitations of voice recognition software   "

## 2024-12-08 NOTE — Patient Instructions (Signed)
 Please return in 3 months to recheck diabetes and blood pressure    I will release your lab results to you on your MyChart account with further instructions. You may see the results before I do, but when I review them I will send you a message with my report or have my assistant call you if things need to be discussed. Please reply to my message with any questions. Thank you!   If you have any questions or concerns, please don't hesitate to send me a message via MyChart or call the office at [phone]. Thank you for visiting with us  today! It's our pleasure caring for you.    VISIT SUMMARY: Today, we discussed your ongoing issues with gait disturbance, blood sugar management, and thrombocytopenia. We also reviewed your general health and chronic conditions.  YOUR PLAN: -GAIT DISTURBANCE: Your gait issues, which include shuffling and stiffness, may be indicative of Parkinson's disease or Lewy body disease. You will be referred to a neurologist for further evaluation. In the meantime, try to stay as active as possible within your comfort level and consider using assistive devices if needed.  -TYPE 2 DIABETES MELLITUS: Your blood sugar levels have been variable due to recent dietary lapses and issues with your glucose monitor. Type 2 diabetes means your body doesn't use insulin  properly. You should check your blood glucose two hours after meals to get a better assessment and continue to follow a healthy diet and exercise as much as your gait issues allow.  -THROMBOCYTOPENIA: Thrombocytopenia means you have a lower than normal number of platelets in your blood. You are scheduled to see a hematologist to determine the cause and appropriate management. Your current platelet levels are not dangerously low, but further evaluation is needed.  -HYPERTENSION: Your blood pressure is being monitored regularly. Hypertension means you have high blood pressure, which can lead to other health issues if not  managed properly. Continue with your current management plan.  INSTRUCTIONS: You have been referred to a neurologist for evaluation of possible Parkinson's disease or Lewy body disease. Additionally, you are scheduled to see a hematologist for your thrombocytopenia. Please continue to monitor your blood glucose levels two hours after meals and maintain your current blood pressure management plan.                      Contains text generated by [employer].                                 Contains text generated by [employer].

## 2024-12-21 ENCOUNTER — Inpatient Hospital Stay

## 2024-12-21 ENCOUNTER — Inpatient Hospital Stay: Attending: Oncology | Admitting: Oncology

## 2024-12-21 VITALS — BP 125/76 | HR 65 | Temp 97.9°F | Resp 13 | Wt 185.4 lb

## 2024-12-21 DIAGNOSIS — Z7982 Long term (current) use of aspirin: Secondary | ICD-10-CM | POA: Insufficient documentation

## 2024-12-21 DIAGNOSIS — I252 Old myocardial infarction: Secondary | ICD-10-CM | POA: Insufficient documentation

## 2024-12-21 DIAGNOSIS — D693 Immune thrombocytopenic purpura: Secondary | ICD-10-CM | POA: Diagnosis not present

## 2024-12-21 DIAGNOSIS — D696 Thrombocytopenia, unspecified: Secondary | ICD-10-CM | POA: Diagnosis not present

## 2024-12-21 DIAGNOSIS — I2510 Atherosclerotic heart disease of native coronary artery without angina pectoris: Secondary | ICD-10-CM | POA: Diagnosis not present

## 2024-12-21 DIAGNOSIS — Z87891 Personal history of nicotine dependence: Secondary | ICD-10-CM | POA: Diagnosis not present

## 2024-12-21 DIAGNOSIS — E039 Hypothyroidism, unspecified: Secondary | ICD-10-CM | POA: Insufficient documentation

## 2024-12-21 LAB — CMP (CANCER CENTER ONLY)
ALT: 13 U/L (ref 0–44)
AST: 21 U/L (ref 15–41)
Albumin: 4.6 g/dL (ref 3.5–5.0)
Alkaline Phosphatase: 49 U/L (ref 38–126)
Anion gap: 11 (ref 5–15)
BUN: 20 mg/dL (ref 8–23)
CO2: 27 mmol/L (ref 22–32)
Calcium: 10.4 mg/dL — ABNORMAL HIGH (ref 8.9–10.3)
Chloride: 105 mmol/L (ref 98–111)
Creatinine: 1.08 mg/dL (ref 0.61–1.24)
GFR, Estimated: >60 mL/min
Glucose, Bld: 129 mg/dL — ABNORMAL HIGH (ref 70–99)
Potassium: 4.5 mmol/L (ref 3.5–5.1)
Sodium: 143 mmol/L (ref 135–145)
Total Bilirubin: 0.9 mg/dL (ref 0.0–1.2)
Total Protein: 7.6 g/dL (ref 6.5–8.1)

## 2024-12-21 LAB — CBC WITH DIFFERENTIAL (CANCER CENTER ONLY)
Abs Immature Granulocytes: 0.01 K/uL (ref 0.00–0.07)
Basophils Absolute: 0 K/uL (ref 0.0–0.1)
Basophils Relative: 0 %
Eosinophils Absolute: 0.1 K/uL (ref 0.0–0.5)
Eosinophils Relative: 2 %
HCT: 42 % (ref 39.0–52.0)
Hemoglobin: 14 g/dL (ref 13.0–17.0)
Immature Granulocytes: 0 %
Lymphocytes Relative: 21 %
Lymphs Abs: 1 K/uL (ref 0.7–4.0)
MCH: 30.6 pg (ref 26.0–34.0)
MCHC: 33.3 g/dL (ref 30.0–36.0)
MCV: 91.9 fL (ref 80.0–100.0)
Monocytes Absolute: 0.5 K/uL (ref 0.1–1.0)
Monocytes Relative: 10 %
Neutro Abs: 3.1 K/uL (ref 1.7–7.7)
Neutrophils Relative %: 67 %
Platelet Count: 122 K/uL — ABNORMAL LOW (ref 150–400)
RBC: 4.57 MIL/uL (ref 4.22–5.81)
RDW: 13.2 % (ref 11.5–15.5)
WBC Count: 4.7 K/uL (ref 4.0–10.5)
nRBC: 0 % (ref 0.0–0.2)

## 2024-12-21 LAB — PROTIME-INR
INR: 1 (ref 0.8–1.2)
Prothrombin Time: 13.9 s (ref 11.4–15.2)

## 2024-12-21 LAB — TSH: TSH: 0.846 u[IU]/mL (ref 0.350–4.500)

## 2024-12-21 LAB — IMMATURE PLATELET FRACTION: Immature Platelet Fraction: 4.1 % (ref 1.2–8.6)

## 2024-12-21 LAB — IRON AND TIBC
Iron: 84 ug/dL (ref 45–182)
Saturation Ratios: 22 % (ref 17.9–39.5)
TIBC: 385 ug/dL (ref 250–450)
UIBC: 302 ug/dL

## 2024-12-21 LAB — LACTATE DEHYDROGENASE: LDH: 138 U/L (ref 105–235)

## 2024-12-21 LAB — APTT: aPTT: 30 s (ref 24–36)

## 2024-12-21 LAB — FERRITIN: Ferritin: 115 ng/mL (ref 24–336)

## 2024-12-21 LAB — VITAMIN B12: Vitamin B-12: 1835 pg/mL — ABNORMAL HIGH (ref 180–914)

## 2024-12-21 LAB — FOLATE: Folate: 8.8 ng/mL

## 2024-12-21 NOTE — Progress Notes (Signed)
 "   [facility]  HEMATOLOGY CLINIC CONSULTATION NOTE   PATIENT NAME: Adrian Gamble   MR#: [id number] DOB: 1946-09-16  DATE OF SERVICE: 12/21/2024  REFERRING PHYSICIAN  Jodie Lavern CROME, MD   Patient Care Team: Jodie Lavern CROME, MD as PCP - General (Family Medicine) Lavona Lynwood, MD as PCP - Cardiology (Cardiology) Army Dallas NOVAK, MD (Inactive) as Consulting Physician (Cardiothoracic Surgery) Hurst Rush, MD (Dermatology) Lavona Lynwood, MD as Consulting Physician (Cardiology) Dohmeier, Dedra, MD as Consulting Physician (Neurology) Nicholaus Sherlean CROME, [facility] (Inactive) (Pharmacist) rodgers ruby as Consulting Physician (Dermatology)   REASON FOR CONSULTATION/ CHIEF COMPLAINT:  Evaluation of thrombocytopenia  ASSESSMENT & PLAN:  Adrian Gamble is an 79 y.o. 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  Thrombocytopenia On 11/11/2024, labs at his PCPs office showed platelet count of 77,000.  White count, hemoglobin were within normal limits.  A referral was sent to us  for further evaluation of thrombocytopenia.  On review of available records, patient has had mild intermittent thrombocytopenia at least since July 2013 with platelet count ranging anywhere between 67,000-176,000, nadir count in September 2025.  Hepatitis testing and HIV testing were negative in July 2025.  Chronic immune thrombocytopenia with platelet counts ranging from 67,000 to 134,000 over several years, most recently 86,000.   He has not experienced significant bleeding except for a single episode of epistaxis and prolonged bleeding after tooth extraction, both resolved.   Daily aspirin  for coronary artery disease may increase bleeding risk. No evidence of acute bleeding or other  concerning symptoms.   Etiology likely immune-mediated, but secondary causes including medication effects, vitamin deficiencies, thyroid  dysfunction, and connective tissue disorders will be evaluated.   Treatment is not indicated unless platelet count drops below 30,000 or significant bleeding occurs.  Labs today showed overall stable platelet count of 122,000.  White count and hemoglobin are within normal limits.  CMP grossly unremarkable.  Iron studies, LDH, B12, folate are all within normal limits.  We pursued additional workup including ANA, rheumatoid factor, immature platelet fraction analysis to rule out other possible etiologies.  - Arranged follow-up phone call in two weeks to review laboratory results.  - Scheduled follow-up visit in four months to monitor platelet count and clinical status.  - Advised him to monitor for bleeding symptoms and report any new or concerning bleeding episodes.  - Documented all results and plans in MyChart for patient access.    I reviewed lab results and outside records for this visit and discussed relevant results with the patient. Diagnosis, plan of care and treatment options were also discussed in detail with the patient. Opportunity provided to ask questions and answers provided to his apparent satisfaction. Provided instructions to call our clinic with any problems, questions or concerns prior to return visit. I recommended to continue follow-up with PCP and sub-specialists. He verbalized understanding and agreed with the plan. No barriers to learning was detected.  Chinita Patten, MD 12/21/2024 10:50 AM [facility] OF [facility] [id number] [place] [address] Dept: [phone] Dept Fax: [phone]  HISTORY OF PRESENTING ILLNESS:   Discussed the use of AI scribe software for clinical note transcription with the patient, who gave verbal consent to  proceed.  History of Present Illness [name]  is a 79 year old male with chronic thrombocytopenia who presents for evaluation of persistent low platelet counts.  On 11/11/2024, labs at his PCPs office showed platelet count of 77,000.  White count, hemoglobin were within normal limits.  A referral was sent to us  for further evaluation of thrombocytopenia.  On review of available records, patient has had mild intermittent thrombocytopenia at least since July 2013 with platelet count ranging anywhere between 67,000-176,000, nadir count in September 2025.  Hepatitis testing and HIV testing were negative in July 2025.  He was referred for hematology consultation after two recent laboratory tests revealed thrombocytopenia. He was previously unaware of low platelet counts except for these recent findings. Review of prior records demonstrates intermittent thrombocytopenia since at least 2013, with a platelet count of 134,000 in 2020, and more recent values of 67,000 in September 2025, 81,000 in November 2025, 77,000 in December 2025, and 86,000 two weeks prior to this visit.  He has not experienced frequent or spontaneous bleeding, but describes two notable episodes: a single episode of epistaxis after blowing his nose that was difficult to control, and prolonged bleeding lasting nearly 24 hours following a tooth extraction approximately three months ago. He denies other bleeding symptoms, including gingival bleeding or easy bruising. He takes 81 mg aspirin  daily for cardiac indications.  He has a history of coronary artery disease, including coronary artery bypass grafting in July 2013 and myocardial infarction with stent placement in September 2001. He follows with cardiology annually and reports stable cardiac status without angina, dyspnea, or palpitations. He also has longstanding type 2 diabetes mellitus.  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 The workup revealed several genetic diseases, one of which is believed responsible for his retinal degeneration. He is followed by ophthalmology and retina specialists, and was previously considered for immunosuppressive therapy but never initiated treatment. He uses Primatene  for ocular pressure control.     MEDICAL HISTORY Past Medical History:  Diagnosis Date   Bilateral primary osteoarthritis of knee 04/27/2020   xrays 04/2020, tricompartmental, worse medially   CAD (coronary artery disease), s/p MI - s/p PCI to RCA 2001;  LHC 7/13: 3v CAD, good LVF - >  s/p CABG 7/13 (L-LAD, S-OM1/dCFX, S-dRCA)    Diabetes mellitus type 2, controlled (HCC)    Diverticulosis    Early cataracts, bilateral    GERD 09/01/2007   H/O hiatal hernia    HLD (hyperlipidemia), on statin    Hypothyroidism. 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   REM disorder   Testosterone  deficiency      SURGICAL HISTORY Past Surgical History:  Procedure Laterality Date   arm surgery  1957   d/t broken   CARDIAC CATHETERIZATION  2005; 06/01/2012   COLONOSCOPY  2005   CORONARY ANGIOPLASTY WITH STENT PLACEMENT  2001   RCA-1 stent   CORONARY ARTERY BYPASS GRAFT  06/09/2012   Procedure: CORONARY ARTERY BYPASS GRAFTING (CABG);  Surgeon: Dallas KATHEE Jude, MD;  [facility];  Service: Open Heart Surgery;  Laterality: N/A;  CABG x four;  using left internal mammary artery and right leg greater saphenous vein harvested endoscopically   ESOPHAGOGASTRODUODENOSCOPY     LEFT HEART CATH AND CORONARY ANGIOGRAPHY N/A 05/03/2021   Procedure:  LEFT HEART CATH AND CORONARY ANGIOGRAPHY;  Surgeon: Jordan, Peter M, MD;  Location: [facility] INVASIVE CV LAB;  Service: Cardiovascular;  Laterality: N/A;   PERCUTANEOUS CORONARY STENT INTERVENTION (PCI-S)  N/A 06/01/2012   Procedure: PERCUTANEOUS CORONARY STENT INTERVENTION (PCI-S);  Surgeon: Lonni JONETTA Cash, MD;  Location: [facility] CATH LAB;  Service: Cardiovascular;  Laterality: N/A;   TONSILLECTOMY AND ADENOIDECTOMY  1955   at age 49     SOCIAL HISTORY: He reports that he quit smoking about 12 years ago. His smoking use included pipe and cigars. He has never used smokeless tobacco. He reports that he does not currently use alcohol. He reports that he does not use drugs. Social History   Socioeconomic History   Marital status: Married    Spouse name: Not on file   Number of children: Not on file   Years of education: Not on file   Highest education level: Not on file  Occupational History   Occupation: Retired  Tobacco Use   Smoking status: Former    Types: Pipe, Cigars    Quit date: 06/01/2012    Years since quitting: 12.5   Smokeless tobacco: Never   Tobacco comments:    patient states that he is not somking the pipe or cigars any more  Substance and Sexual Activity   Alcohol use: Not Currently    Comment: rare   Drug use: No   Sexual activity: Yes  Other Topics Concern   Not on file  Social History Narrative   Not on file   Social Drivers of Health   Tobacco Use: Medium Risk (12/08/2024)   Patient History    Smoking Tobacco Use: Former    Smokeless Tobacco Use: Never    Passive Exposure: Not on Actuary Strain: Low Risk (08/31/2024)   Overall Financial Resource Strain (CARDIA)    Difficulty of Paying Living Expenses: Not hard at all  Food Insecurity: No Food Insecurity (12/21/2024)   Epic    Worried About Radiation Protection Practitioner of Food in the Last Year: Never true    Ran Out of Food in the Last Year: Never true  Transportation Needs: No Transportation Needs (12/21/2024)   Epic    Lack of Transportation (Medical): No    Lack of Transportation (Non-Medical): No  Physical Activity: Inactive (08/31/2024)   Exercise Vital Sign    Days of Exercise per Week: 0 days     Minutes of Exercise per Session: 0 min  Stress: No Stress Concern Present (08/31/2024)   [employer] of Occupational Health - Occupational Stress Questionnaire    Feeling of Stress: Not at all  Social Connections: Moderately Integrated (08/31/2024)   Social Connection and Isolation Panel    Frequency of Communication with Friends and Family: More than three times a week    Frequency of Social Gatherings with Friends and Family: More than three times a week    Attends Religious Services: More than 4 times per year    Active Member of Clubs or Organizations: No    Attends Banker Meetings: Never    Marital Status: Married  Catering Manager Violence: Not At Risk (12/21/2024)   Epic    Fear of Current or Ex-Partner: No    Emotionally Abused: No    Physically Abused: No    Sexually Abused: No  Depression (PHQ2-9): Low Risk (12/21/2024)   Depression (PHQ2-9)    PHQ-2 Score: 1  Alcohol Screen: Low Risk (08/31/2024)   Alcohol Screen  Last Alcohol Screening Score (AUDIT): 0  Housing: High Risk (12/21/2024)   Epic    Unable to Pay for Housing in the Last Year: Yes    Number of Times Moved in the Last Year: 0    Homeless in the Last Year: No  Utilities: Not At Risk (12/21/2024)   Epic    Threatened with loss of utilities: No  Health Literacy: Adequate Health Literacy (08/31/2024)   B1300 Health Literacy    Frequency of need for help with medical instructions: Never    FAMILY HISTORY: His family history includes Coronary artery disease (age of onset: 77) in his father; Memory loss in his father.  CURRENT MEDICATIONS   Current Outpatient Medications  Medication Instructions   Accu-Chek Softclix Lancets lancets CHECK BS 4X A DAY   aspirin  EC 81 mg, Daily   Blood Glucose Monitoring Suppl (ACCU-CHEK GUIDE) w/Device KIT Use kit as directed.   brimonidine (ALPHAGAN P) 0.1 % SOLN No dose, route, or frequency recorded.   ezetimibe  (ZETIA ) 10 mg, Oral, Daily   glucose blood  (ACCU-CHEK GUIDE TEST) test strip USE TO TET BS 2X DAILY   glucose blood test strip Use as instructed   isosorbide  mononitrate (IMDUR ) 60 mg, Oral, Daily   levothyroxine  (SYNTHROID ) 100 mcg, Oral, Daily   lisinopril  (ZESTRIL ) 5 mg, Oral, Daily   Melatonin ER 5 mg, Daily at bedtime   metFORMIN  (GLUCOPHAGE -XR) 500 mg, Oral, 2 times daily with meals   metoprolol  tartrate (LOPRESSOR ) 25 mg, Oral, 2 times daily   nitroGLYCERIN  (NITROSTAT ) 0.4 mg, Sublingual, Every 5 min PRN   ONETOUCH VERIO test strip USE  STRIP TO CHECK GLUCOSE UP TO THREE TIMES DAILY (MORNING,  NOON,  AND  BEDTIME)   rosuvastatin  (CRESTOR ) 40 mg, Oral, Nightly   vitamin B-12 (CYANOCOBALAMIN ) 100 mcg, Daily     ALLERGIES  He is allergic to farxiga  [dapagliflozin ] and oxycontin  [oxycodone ].  REVIEW OF SYSTEMS:  Review of Systems - Oncology   Rest of the pertinent review of systems is unremarkable except as mentioned above in HPI.  PHYSICAL EXAMINATION:   Onc Performance Status - 12/21/24 1018       ECOG Perf Status   ECOG Perf Status Ambulatory and capable of all selfcare but unable to carry out any work activities.  Up and about more than 50% of waking hours      KPS SCALE   KPS % SCORE Cares for self, unable to carry on normal activity or to do active work          Vitals:   12/21/24 1013  BP: 125/76  Pulse: 65  Resp: 13  Temp: 97.9 F (36.6 C)  SpO2: 98%   Filed Weights   12/21/24 1013  Weight: 185 lb 6.4 oz (84.1 kg)    Physical Exam Constitutional:      General: He is not in acute distress.    Appearance: Normal appearance.  HENT:     Head: Normocephalic and atraumatic.  Eyes:     Conjunctiva/sclera: Conjunctivae normal.  Cardiovascular:     Rate and Rhythm: Normal rate and regular rhythm.  Pulmonary:     Effort: Pulmonary effort is normal. No respiratory distress.  Abdominal:     General: There is no distension.  Neurological:     General: No focal deficit present.     Mental  Status: He is alert and oriented to person, place, and time.  Psychiatric:        Mood and Affect: Mood  normal.        Behavior: Behavior normal.      LABORATORY DATA:   I have reviewed the data as listed.  Results for orders placed or performed in visit on 12/21/24  APTT  Result Value Ref Range   aPTT 30 24 - 36 seconds  Protime-INR  Result Value Ref Range   Prothrombin Time 13.9 11.4 - 15.2 seconds   INR 1.0 0.8 - 1.2  Immature Platelet Fraction  Result Value Ref Range   Immature Platelet Fraction 4.1 1.2 - 8.6 %  TSH  Result Value Ref Range   TSH 0.846 0.350 - 4.500 uIU/mL  Rheumatoid factor  Result Value Ref Range   Rheumatoid fact SerPl-aCnc <10.0 <14.0 IU/mL  ANA w/Reflex if Positive  Result Value Ref Range   Anti Nuclear Antibody (ANA) Positive (A) Negative  Methylmalonic acid, serum  Result Value Ref Range   Methylmalonic Acid, Quantitative 102 0 - 378 nmol/L  Folate  Result Value Ref Range   Folate 8.8 >5.9 ng/mL  Vitamin B12  Result Value Ref Range   Vitamin B-12 1,835 (H) 180 - 914 pg/mL  Ferritin  Result Value Ref Range   Ferritin 115 24 - 336 ng/mL  Iron and TIBC  Result Value Ref Range   Iron 84 45 - 182 ug/dL   TIBC 614 749 - 549 ug/dL   Saturation Ratios 22 17.9 - 39.5 %   UIBC 302 ug/dL  Lactate dehydrogenase  Result Value Ref Range   LDH 138 105 - 235 U/L  CMP (Cancer Center only)  Result Value Ref Range   Sodium 143 135 - 145 mmol/L   Potassium 4.5 3.5 - 5.1 mmol/L   Chloride 105 98 - 111 mmol/L   CO2 27 22 - 32 mmol/L   Glucose, Bld 129 (H) 70 - 99 mg/dL   BUN 20 8 - 23 mg/dL   Creatinine 8.91 9.38 - 1.24 mg/dL   Calcium  10.4 (H) 8.9 - 10.3 mg/dL   Total Protein 7.6 6.5 - 8.1 g/dL   Albumin  4.6 3.5 - 5.0 g/dL   AST 21 15 - 41 U/L   ALT 13 0 - 44 U/L   Alkaline Phosphatase 49 38 - 126 U/L   Total Bilirubin 0.9 0.0 - 1.2 mg/dL   GFR, Estimated >39 >39 mL/min   Anion gap 11 5 - 15  CBC with Differential (Cancer Center Only)   Result Value Ref Range   WBC Count 4.7 4.0 - 10.5 K/uL   RBC 4.57 4.22 - 5.81 MIL/uL   Hemoglobin 14.0 13.0 - 17.0 g/dL   HCT 57.9 60.9 - 47.9 %   MCV 91.9 80.0 - 100.0 fL   MCH 30.6 26.0 - 34.0 pg   MCHC 33.3 30.0 - 36.0 g/dL   RDW 86.7 88.4 - 84.4 %   Platelet Count 122 (L) 150 - 400 K/uL   nRBC 0.0 0.0 - 0.2 %   Neutrophils Relative % 67 %   Neutro Abs 3.1 1.7 - 7.7 K/uL   Lymphocytes Relative 21 %   Lymphs Abs 1.0 0.7 - 4.0 K/uL   Monocytes Relative 10 %   Monocytes Absolute 0.5 0.1 - 1.0 K/uL   Eosinophils Relative 2 %   Eosinophils Absolute 0.1 0.0 - 0.5 K/uL   Basophils Relative 0 %   Basophils Absolute 0.0 0.0 - 0.1 K/uL   Immature Granulocytes 0 %   Abs Immature Granulocytes 0.01 0.00 - 0.07 K/uL  ENA+DNA/DS+ANTICH+CENTRO+JO...  Result  Value Ref Range   ds DNA Ab <1 0 - 9 IU/mL   Ribonucleic Protein 1.4 (H) 0.0 - 0.9 AI   ENA SM Ab Ser-aCnc <0.2 0.0 - 0.9 AI   Scleroderma (Scl-70) (ENA) Antibody, IgG <0.2 0.0 - 0.9 AI   SSA (Ro) (ENA) Antibody, IgG <0.2 0.0 - 0.9 AI   SSB (La) (ENA) Antibody, IgG <0.2 0.0 - 0.9 AI   Chromatin Ab SerPl-aCnc <0.2 0.0 - 0.9 AI   Anti JO-1 <0.2 0.0 - 0.9 AI   Centromere Ab Screen <0.2 0.0 - 0.9 AI   See below: Comment      RADIOGRAPHIC STUDIES:  No pertinent imaging studies available to review.  Orders Placed This Encounter  Procedures   CBC with Differential (Cancer Center Only)    Standing Status:   Future    Expiration Date:   12/21/2025   CMP (Cancer Center only)    Standing Status:   Future    Expiration Date:   12/21/2025   Lactate dehydrogenase    Standing Status:   Future    Expiration Date:   12/21/2025   Iron and TIBC    Standing Status:   Future    Expiration Date:   12/21/2025   Ferritin    Standing Status:   Future    Expiration Date:   12/21/2025   Vitamin B12    Standing Status:   Future    Expiration Date:   12/21/2025   Folate    Standing Status:   Future    Expiration Date:   12/21/2025    Methylmalonic acid, serum    Standing Status:   Future    Expiration Date:   12/21/2025   ANA w/Reflex if Positive    Standing Status:   Future    Expiration Date:   12/21/2025   Rheumatoid factor    Standing Status:   Future    Expiration Date:   12/21/2025   TSH    Standing Status:   Future    Expiration Date:   12/21/2025   Immature Platelet Fraction    Standing Status:   Future    Expiration Date:   12/21/2025   Protime-INR    Standing Status:   Future    Expiration Date:   12/21/2025   APTT    Standing Status:   Future    Expiration Date:   12/21/2025    Future Appointments  Date Time Provider Department Center  01/04/2025 12:30 PM Aydien Majette, Chinita, MD [facility] None  02/14/2025 10:15 AM Tat, Asberry RAMAN, DO [facility] None  03/08/2025  9:00 AM Jodie Lavern CROME, MD [facility] [place]  04/19/2025  9:15 AM [facility] PHLEBOTOMIST [facility] None  04/19/2025  9:45 AM Albirda Shiel, Chinita, MD [facility] None  09/06/2025  1:00 PM [facility] ANNUAL WELLNESS VISIT 1 [facility] [place]    I spent a total of 55 minutes during this encounter with the patient including review of chart and various tests results, discussions about plan of care and coordination of care plan.  This document was completed utilizing speech recognition software. Grammatical errors, random word insertions, pronoun errors, and incomplete sentences are an occasional consequence of this system due to software limitations, ambient noise, and hardware issues. Any formal questions or concerns about the content, text or information contained within the body of this dictation should be directly addressed to the provider for clarification.   "

## 2024-12-22 LAB — ENA+DNA/DS+ANTICH+CENTRO+JO...
Anti JO-1: <0.2 AI (ref 0.0–0.9)
Centromere Ab Screen: <0.2 AI (ref 0.0–0.9)
Chromatin Ab SerPl-aCnc: <0.2 AI (ref 0.0–0.9)
ENA SM Ab Ser-aCnc: <0.2 AI (ref 0.0–0.9)
Ribonucleic Protein: 1.4 AI — ABNORMAL HIGH (ref 0.0–0.9)
SSA (Ro) (ENA) Antibody, IgG: <0.2 AI (ref 0.0–0.9)
SSB (La) (ENA) Antibody, IgG: <0.2 AI (ref 0.0–0.9)
Scleroderma (Scl-70) (ENA) Antibody, IgG: <0.2 AI (ref 0.0–0.9)
ds DNA Ab: <1 [IU]/mL (ref 0–9)

## 2024-12-22 LAB — RHEUMATOID FACTOR: Rheumatoid fact SerPl-aCnc: <10 [IU]/mL

## 2024-12-22 LAB — ANA W/REFLEX IF POSITIVE: Anti Nuclear Antibody (ANA): POSITIVE — AB

## 2024-12-23 LAB — METHYLMALONIC ACID, SERUM: Methylmalonic Acid, Quantitative: 102 nmol/L (ref 0–378)

## 2024-12-27 ENCOUNTER — Encounter: Payer: Self-pay | Admitting: Oncology

## 2024-12-27 DIAGNOSIS — D696 Thrombocytopenia, unspecified: Secondary | ICD-10-CM | POA: Insufficient documentation

## 2024-12-27 NOTE — Assessment & Plan Note (Signed)
 On 11/11/2024, labs at his PCPs office showed platelet count of 77,000.  White count, hemoglobin were within normal limits.  A referral was sent to us  for further evaluation of thrombocytopenia.  On review of available records, patient has had mild intermittent thrombocytopenia at least since July 2013 with platelet count ranging anywhere between 67,000-176,000, nadir count in September 2025.  Hepatitis testing and HIV testing were negative in July 2025.  Chronic immune thrombocytopenia with platelet counts ranging from 67,000 to 134,000 over several years, most recently 86,000.   He has not experienced significant bleeding except for a single episode of epistaxis and prolonged bleeding after tooth extraction, both resolved.   Daily aspirin  for coronary artery disease may increase bleeding risk. No evidence of acute bleeding or other concerning symptoms.   Etiology likely immune-mediated, but secondary causes including medication effects, vitamin deficiencies, thyroid  dysfunction, and connective tissue disorders will be evaluated.   Treatment is not indicated unless platelet count drops below 30,000 or significant bleeding occurs.  Labs today showed overall stable platelet count of 122,000.  White count and hemoglobin are within normal limits.  CMP grossly unremarkable.  Iron studies, LDH, B12, folate are all within normal limits.  We pursued additional workup including ANA, rheumatoid factor, immature platelet fraction analysis to rule out other possible etiologies.  - Arranged follow-up phone call in two weeks to review laboratory results.  - Scheduled follow-up visit in four months to monitor platelet count and clinical status.  - Advised him to monitor for bleeding symptoms and report any new or concerning bleeding episodes.  - Documented all results and plans in MyChart for patient access.

## 2025-01-04 ENCOUNTER — Inpatient Hospital Stay: Attending: Oncology | Admitting: Oncology

## 2025-01-04 DIAGNOSIS — D696 Thrombocytopenia, unspecified: Secondary | ICD-10-CM

## 2025-02-07 ENCOUNTER — Ambulatory Visit: Admitting: Neurology

## 2025-02-14 ENCOUNTER — Ambulatory Visit: Admitting: Neurology

## 2025-02-25 ENCOUNTER — Ambulatory Visit: Admitting: Family Medicine

## 2025-03-08 ENCOUNTER — Ambulatory Visit: Admitting: Family Medicine

## 2025-04-19 ENCOUNTER — Inpatient Hospital Stay: Admitting: Oncology

## 2025-04-19 ENCOUNTER — Inpatient Hospital Stay

## 2025-09-06 ENCOUNTER — Ambulatory Visit
